# Patient Record
Sex: Female | Born: 1965 | Race: White | Hispanic: No | Smoking: Current every day smoker
Health system: Southern US, Community
[De-identification: ages and names within clinical notes are randomized; demographics above are authoritative.]

## PROBLEM LIST (undated history)

## (undated) DIAGNOSIS — F4310 Post-traumatic stress disorder, unspecified: Secondary | ICD-10-CM

## (undated) DIAGNOSIS — M5430 Sciatica, unspecified side: Secondary | ICD-10-CM

## (undated) DIAGNOSIS — M1990 Unspecified osteoarthritis, unspecified site: Secondary | ICD-10-CM

## (undated) DIAGNOSIS — G43909 Migraine, unspecified, not intractable, without status migrainosus: Secondary | ICD-10-CM

## (undated) DIAGNOSIS — R112 Nausea with vomiting, unspecified: Secondary | ICD-10-CM

## (undated) DIAGNOSIS — F419 Anxiety disorder, unspecified: Secondary | ICD-10-CM

## (undated) DIAGNOSIS — Z9889 Other specified postprocedural states: Secondary | ICD-10-CM

## (undated) DIAGNOSIS — N390 Urinary tract infection, site not specified: Secondary | ICD-10-CM

## (undated) HISTORY — DX: Post-traumatic stress disorder, unspecified: F43.10

## (undated) HISTORY — PX: WISDOM TOOTH EXTRACTION: SHX21

---

## 1998-05-05 ENCOUNTER — Emergency Department (HOSPITAL_COMMUNITY): Admission: EM | Admit: 1998-05-05 | Discharge: 1998-05-05 | Payer: Self-pay | Admitting: Emergency Medicine

## 1999-02-17 ENCOUNTER — Emergency Department (HOSPITAL_COMMUNITY): Admission: EM | Admit: 1999-02-17 | Discharge: 1999-02-17 | Payer: Self-pay | Admitting: Emergency Medicine

## 1999-06-17 ENCOUNTER — Encounter: Payer: Self-pay | Admitting: Emergency Medicine

## 1999-06-17 ENCOUNTER — Emergency Department (HOSPITAL_COMMUNITY): Admission: EM | Admit: 1999-06-17 | Discharge: 1999-06-17 | Payer: Self-pay | Admitting: Emergency Medicine

## 2001-03-30 ENCOUNTER — Emergency Department (HOSPITAL_COMMUNITY): Admission: EM | Admit: 2001-03-30 | Discharge: 2001-03-30 | Payer: Self-pay | Admitting: *Deleted

## 2002-11-27 ENCOUNTER — Emergency Department (HOSPITAL_COMMUNITY): Admission: EM | Admit: 2002-11-27 | Discharge: 2002-11-27 | Payer: Self-pay | Admitting: Emergency Medicine

## 2003-02-02 ENCOUNTER — Emergency Department (HOSPITAL_COMMUNITY): Admission: EM | Admit: 2003-02-02 | Discharge: 2003-02-03 | Payer: Self-pay

## 2004-09-04 ENCOUNTER — Emergency Department (HOSPITAL_COMMUNITY): Admission: EM | Admit: 2004-09-04 | Discharge: 2004-09-04 | Payer: Self-pay | Admitting: Emergency Medicine

## 2004-09-13 ENCOUNTER — Emergency Department (HOSPITAL_COMMUNITY): Admission: EM | Admit: 2004-09-13 | Discharge: 2004-09-13 | Payer: Self-pay | Admitting: Emergency Medicine

## 2004-09-13 IMAGING — CR DG CHEST 2V
2 series · 2 of 2 positions shown · non-contrast
Comparison: None.

CLINICAL DATA: Febrile.  Cough.  Flulike symptoms.
 TWO VIEWS OF THE CHEST:

[view not recorded (1 of 2)]
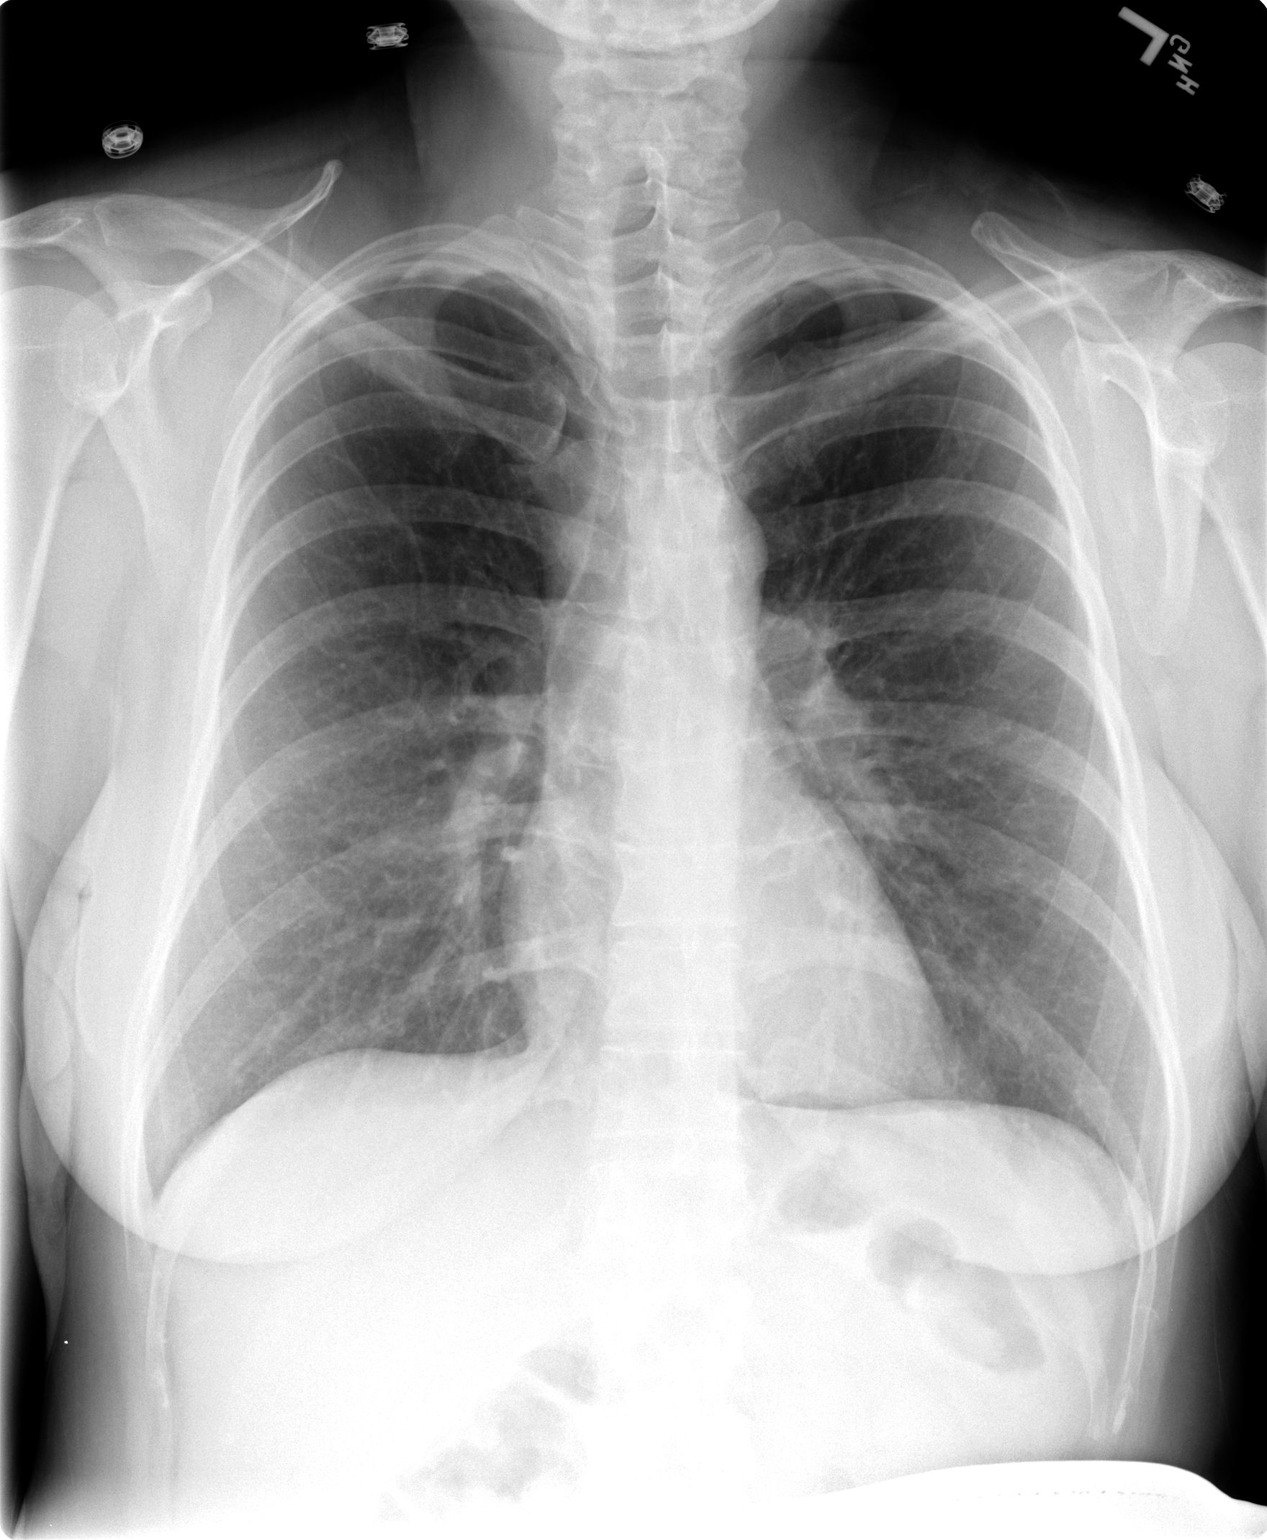

[view not recorded (2 of 2)]
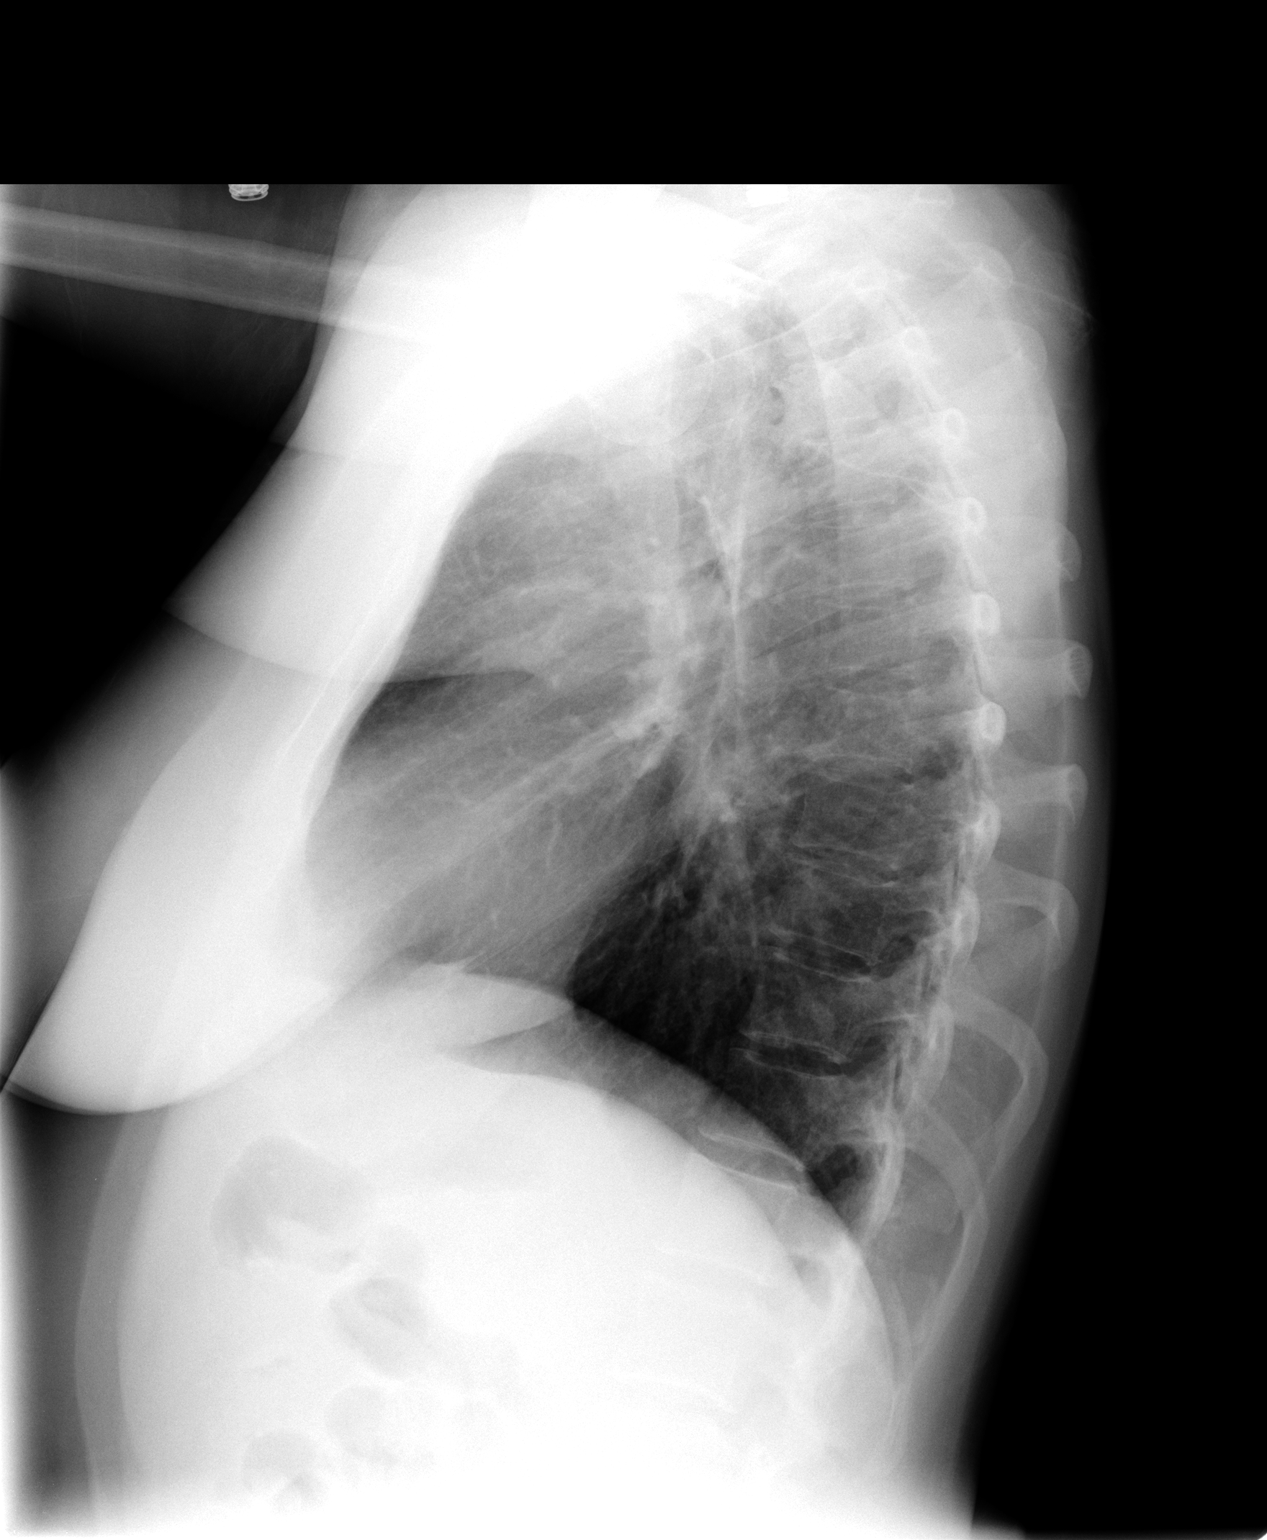

[2 of 2 positions shown; findings below may reference images not displayed]

FINDINGS: No focal lung opacities.  The heart size is normal.  No pleural effusions.
IMPRESSION: No pneumonia.

## 2009-10-12 ENCOUNTER — Emergency Department (HOSPITAL_COMMUNITY): Admission: EM | Admit: 2009-10-12 | Discharge: 2009-10-12 | Payer: Self-pay | Admitting: Emergency Medicine

## 2009-10-12 IMAGING — CT CT ABD-PELV W/O CM
2 of 4 series · 17 of 46 positions shown, 19 images · non-contrast
Comparison: None

CLINICAL DATA: Left flank pain.

CT ABDOMEN AND PELVIS WITHOUT CONTRAST
TECHNIQUE: Multidetector CT imaging of the abdomen and pelvis was
performed following the standard protocol without intravenous
contrast.

[Series 2: stone <(id) w/o a & p (id) · axial · non-contrast · 0.70mm/px · z∈[-381,-11]mm · 14 of 82 slices shown, 16 images]
[im 4/82  soft-tissue]
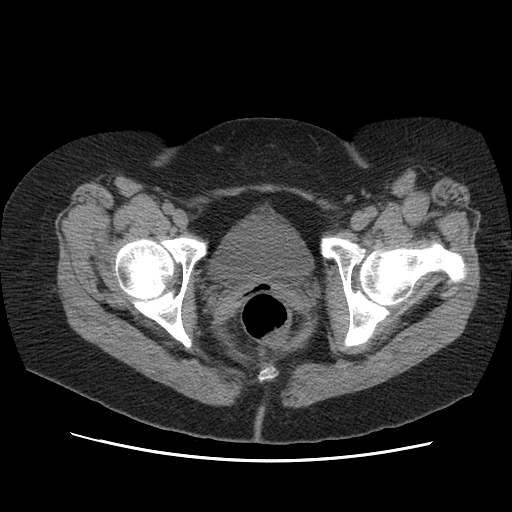
[im 4/82  bone]
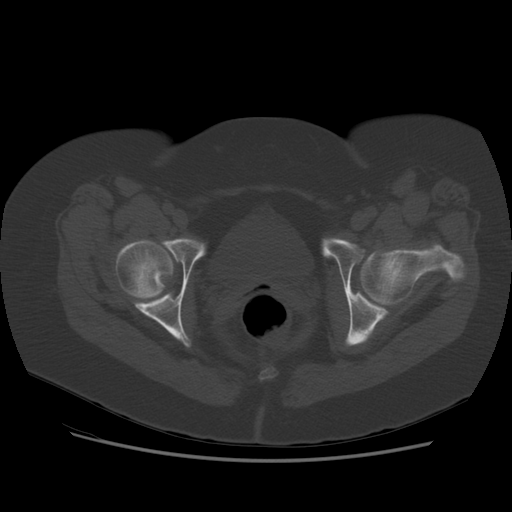
[im 10/82  soft-tissue]
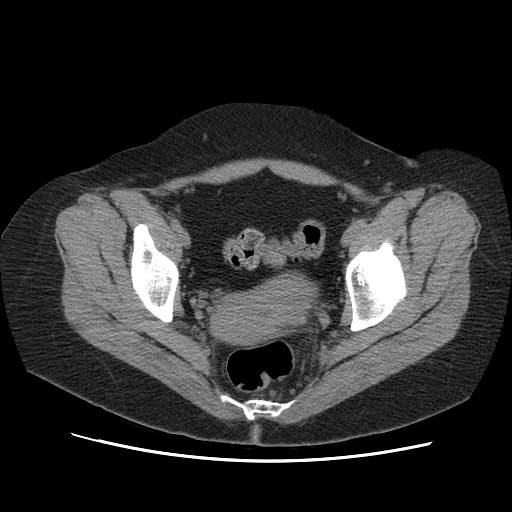
[im 17/82  soft-tissue]
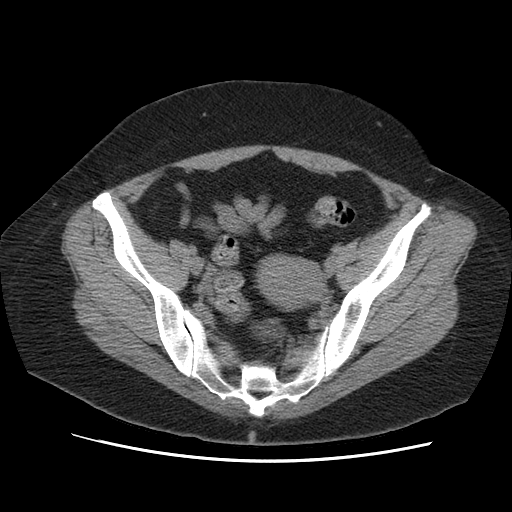
[im 23/82  soft-tissue]
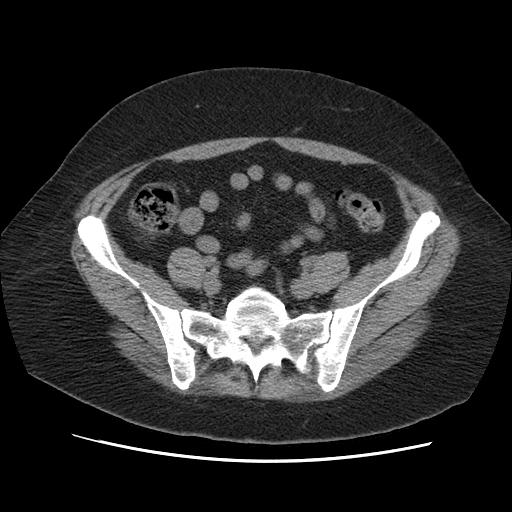
[im 26/82  soft-tissue]
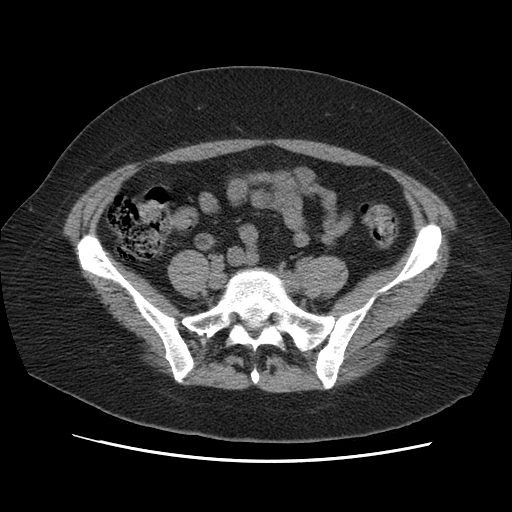
[im 33/82  soft-tissue]
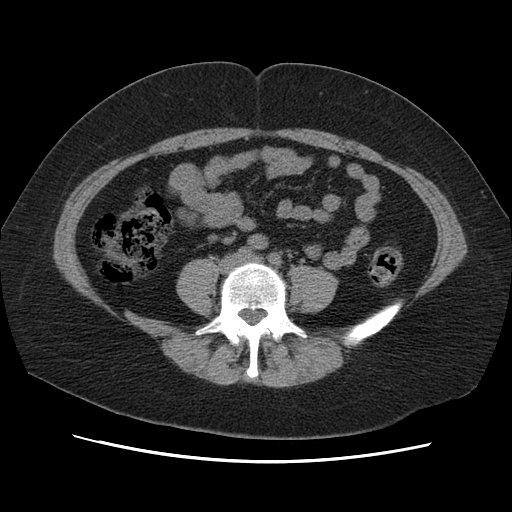
[im 39/82  soft-tissue]
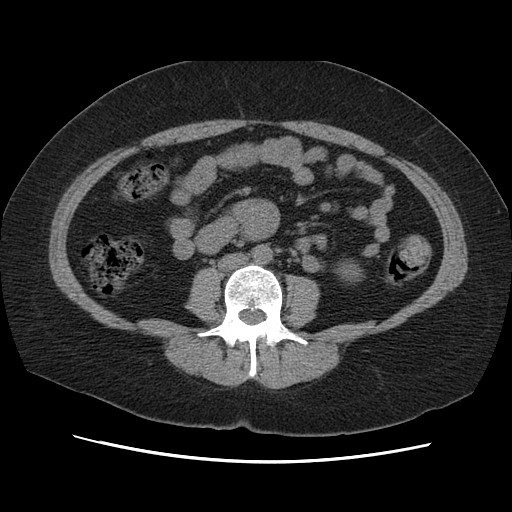
[im 43/82  soft-tissue]
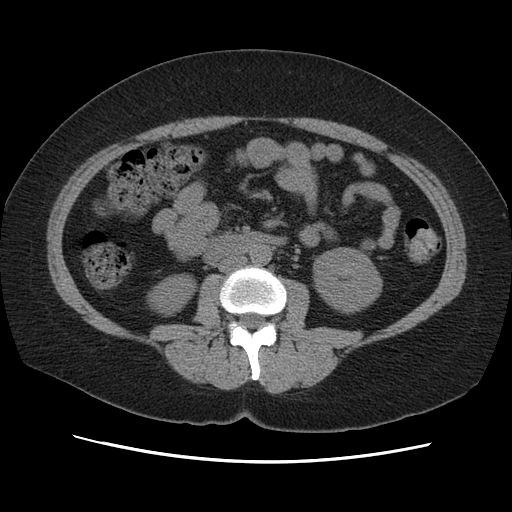
[im 49/82  soft-tissue]
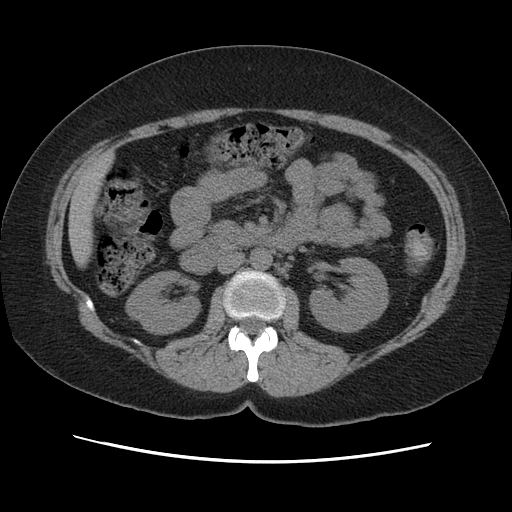
[im 49/82  bone]
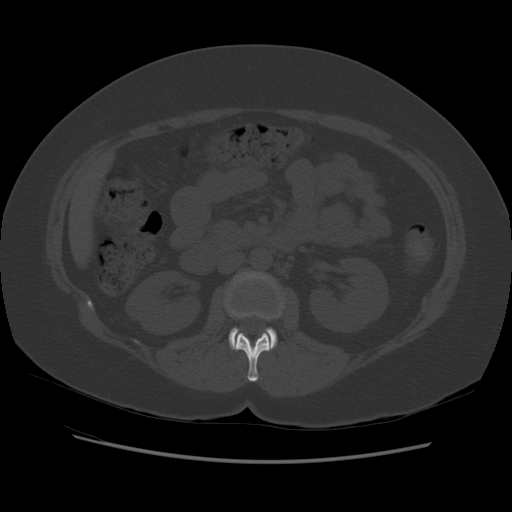
[im 56/82  soft-tissue]
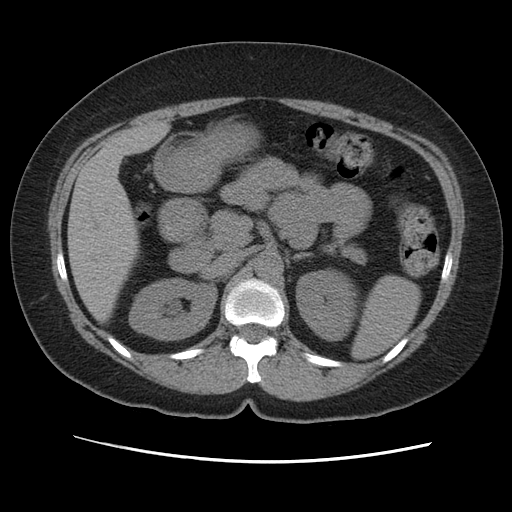
[im 62/82  soft-tissue]
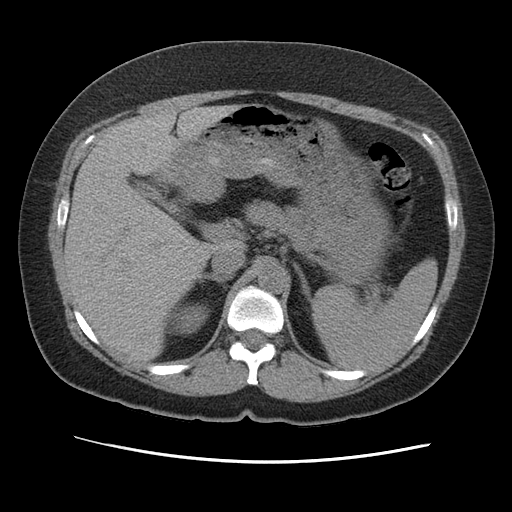
[im 65/82  soft-tissue]
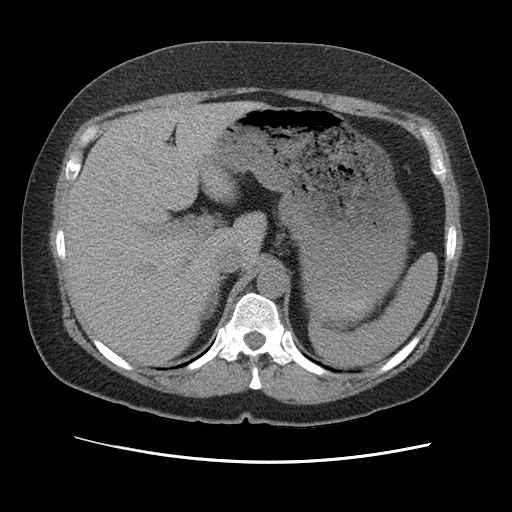
[im 72/82  soft-tissue]
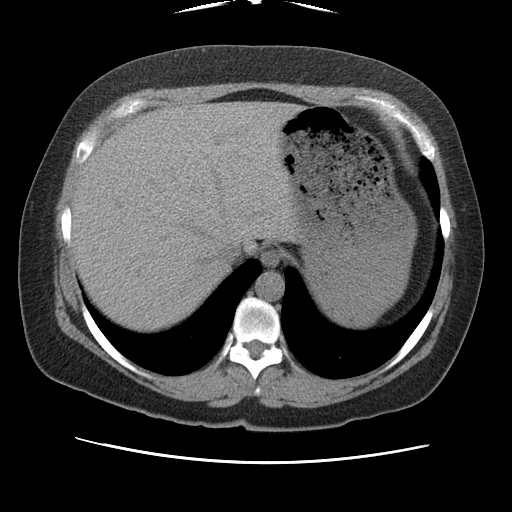
[im 78/82  soft-tissue]
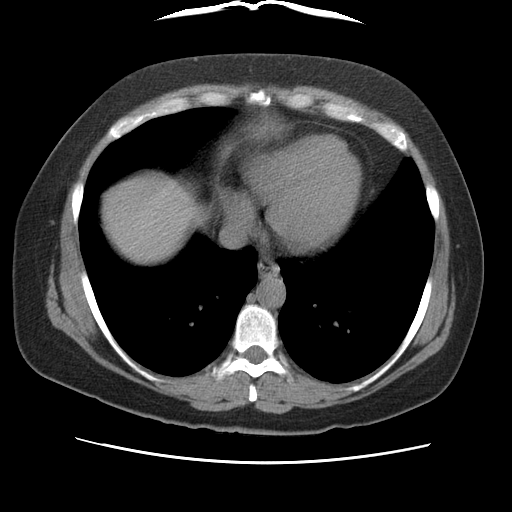

[Series 401: reformatted · coronal · 0.92mm/px · 3 of 114 slices shown]
[im 38/114  soft-tissue]
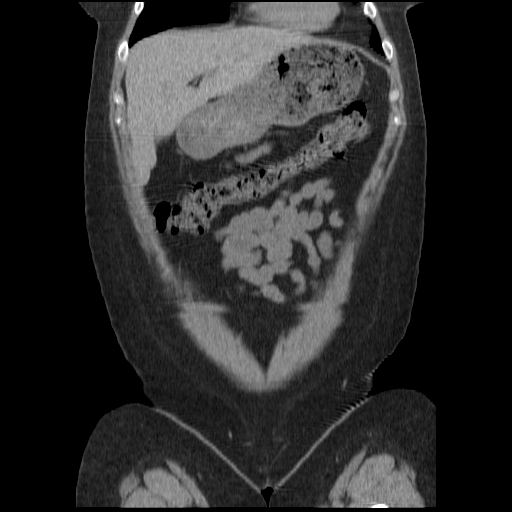
[im 51/114  soft-tissue]
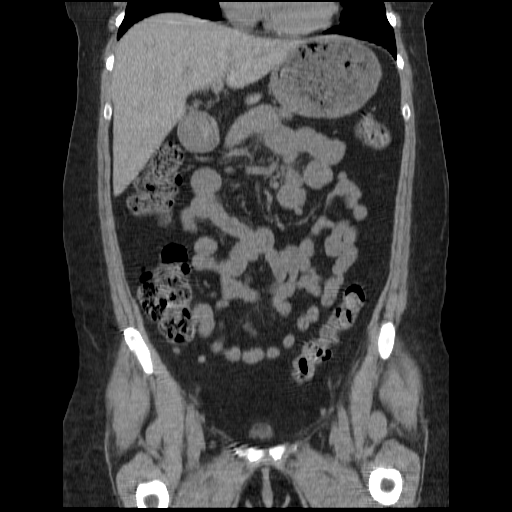
[im 63/114  soft-tissue]
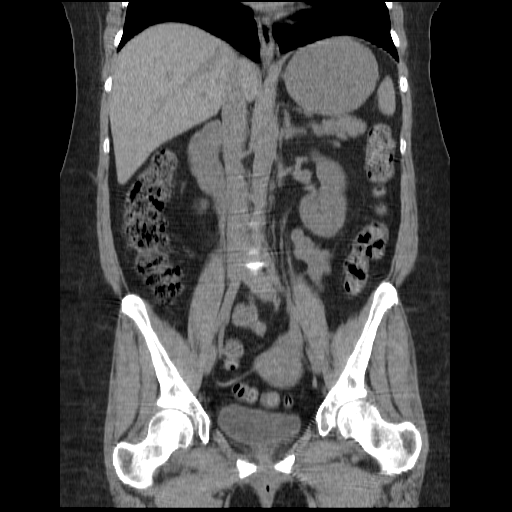

[17 of 46 positions shown; findings below may reference images not displayed]

FINDINGS: Lung bases are clear.  No effusions.  Heart is normal
size.

Abdomen:  No renal or proximal ureteral stones.  No hydronephrosis.
Liver, spleen, pancreas, adrenals have an unremarkable unenhanced
appearance.  Gallbladder is decompressed. Bowel grossly
unremarkable.  No free fluid, free air, or adenopathy.

Pelvis:  Ureters decompressed as is the bladder.  No stones.
Uterus and ovaries have an unremarkable unenhanced appearance.
Bowel grossly unremarkable.  No free fluid, free air, or
adenopathy. Appendix is normal.

No acute bony abnormality.
IMPRESSION: No renal or ureteral stones.  No hydronephrosis.

Normal appendix.

## 2010-10-12 LAB — CBC
MCHC: 33.9 g/dL (ref 30.0–36.0)
MCV: 87.7 fL (ref 78.0–100.0)
Platelets: 281 10*3/uL (ref 150–400)
RDW: 13.8 % (ref 11.5–15.5)

## 2010-10-12 LAB — DIFFERENTIAL
Basophils Absolute: 0.1 10*3/uL (ref 0.0–0.1)
Basophils Relative: 1 % (ref 0–1)
Eosinophils Absolute: 0.3 10*3/uL (ref 0.0–0.7)
Monocytes Relative: 10 % (ref 3–12)
Neutro Abs: 5.3 10*3/uL (ref 1.7–7.7)
Neutrophils Relative %: 57 % (ref 43–77)

## 2010-10-12 LAB — URINALYSIS, ROUTINE W REFLEX MICROSCOPIC
Bilirubin Urine: NEGATIVE
Glucose, UA: NEGATIVE mg/dL
Hgb urine dipstick: NEGATIVE
Ketones, ur: NEGATIVE mg/dL
Nitrite: NEGATIVE
Protein, ur: NEGATIVE mg/dL
Specific Gravity, Urine: 1.02 (ref 1.005–1.030)
Urobilinogen, UA: 0.2 mg/dL (ref 0.0–1.0)
pH: 7.5 (ref 5.0–8.0)

## 2010-10-12 LAB — URINE CULTURE: Colony Count: 25000

## 2010-10-12 LAB — POCT I-STAT, CHEM 8
BUN: 12 mg/dL (ref 6–23)
Calcium, Ion: 1.11 mmol/L — ABNORMAL LOW (ref 1.12–1.32)
Creatinine, Ser: 0.7 mg/dL (ref 0.4–1.2)
Glucose, Bld: 106 mg/dL — ABNORMAL HIGH (ref 70–99)
Hemoglobin: 17 g/dL — ABNORMAL HIGH (ref 12.0–15.0)
TCO2: 22 mmol/L (ref 0–100)

## 2014-05-03 ENCOUNTER — Emergency Department (HOSPITAL_COMMUNITY)
Admission: EM | Admit: 2014-05-03 | Discharge: 2014-05-04 | Disposition: A | Discharge status: Home / Self Care | Payer: Self-pay | Attending: Emergency Medicine | Admitting: Emergency Medicine

## 2014-05-03 ENCOUNTER — Encounter (HOSPITAL_COMMUNITY): Payer: Self-pay | Admitting: Emergency Medicine

## 2014-05-03 DIAGNOSIS — M545 Low back pain, unspecified: Secondary | ICD-10-CM

## 2014-05-03 DIAGNOSIS — Z720 Tobacco use: Secondary | ICD-10-CM | POA: Insufficient documentation

## 2014-05-03 DIAGNOSIS — R110 Nausea: Secondary | ICD-10-CM | POA: Insufficient documentation

## 2014-05-03 LAB — CBC WITH DIFFERENTIAL/PLATELET
BASOS ABS: 0.1 10*3/uL (ref 0.0–0.1)
BASOS PCT: 1 % (ref 0–1)
EOS PCT: 3 % (ref 0–5)
Eosinophils Absolute: 0.3 10*3/uL (ref 0.0–0.7)
HEMATOCRIT: 41.3 % (ref 36.0–46.0)
HEMOGLOBIN: 14 g/dL (ref 12.0–15.0)
LYMPHS PCT: 34 % (ref 12–46)
Lymphs Abs: 3.6 10*3/uL (ref 0.7–4.0)
MCH: 29.4 pg (ref 26.0–34.0)
MCHC: 33.9 g/dL (ref 30.0–36.0)
MCV: 86.6 fL (ref 78.0–100.0)
MONO ABS: 0.8 10*3/uL (ref 0.1–1.0)
MONOS PCT: 8 % (ref 3–12)
Neutro Abs: 5.9 10*3/uL (ref 1.7–7.7)
Neutrophils Relative %: 54 % (ref 43–77)
Platelets: 282 10*3/uL (ref 150–400)
RBC: 4.77 MIL/uL (ref 3.87–5.11)
RDW: 13.8 % (ref 11.5–15.5)
WBC: 10.7 10*3/uL — ABNORMAL HIGH (ref 4.0–10.5)

## 2014-05-03 LAB — URINALYSIS, ROUTINE W REFLEX MICROSCOPIC
BILIRUBIN URINE: NEGATIVE
Glucose, UA: NEGATIVE mg/dL
HGB URINE DIPSTICK: NEGATIVE
KETONES UR: NEGATIVE mg/dL
Leukocytes, UA: NEGATIVE
NITRITE: NEGATIVE
PH: 7 (ref 5.0–8.0)
Protein, ur: NEGATIVE mg/dL
SPECIFIC GRAVITY, URINE: 1.027 (ref 1.005–1.030)
Urobilinogen, UA: 0.2 mg/dL (ref 0.0–1.0)

## 2014-05-03 LAB — BASIC METABOLIC PANEL
ANION GAP: 16 — AB (ref 5–15)
BUN: 16 mg/dL (ref 6–23)
CALCIUM: 9.2 mg/dL (ref 8.4–10.5)
CO2: 21 meq/L (ref 19–32)
CREATININE: 0.71 mg/dL (ref 0.50–1.10)
Chloride: 105 mEq/L (ref 96–112)
GFR calc Af Amer: >90 mL/min (ref >90)
GFR calc non Af Amer: >90 mL/min (ref >90)
Glucose, Bld: 92 mg/dL (ref 70–99)
Potassium: 4.1 mEq/L (ref 3.7–5.3)
Sodium: 142 mEq/L (ref 137–147)

## 2014-05-03 MED ORDER — IBUPROFEN 600 MG PO TABS: 600.0000 mg | ORAL_TABLET | Freq: Four times a day (QID) | ORAL | Status: DC | PRN

## 2014-05-03 MED ORDER — OXYCODONE-ACETAMINOPHEN 5-325 MG PO TABS
1.0000 | ORAL_TABLET | Freq: Once | ORAL | Status: AC
  Administered 2014-05-03: 1 via ORAL
  Filled 2014-05-03: qty 1

## 2014-05-03 MED ORDER — IBUPROFEN 200 MG PO TABS
600.0000 mg | ORAL_TABLET | Freq: Once | ORAL | Status: AC
  Administered 2014-05-03: 600 mg via ORAL
  Filled 2014-05-03: qty 3

## 2014-05-03 MED ORDER — OXYCODONE-ACETAMINOPHEN 5-325 MG PO TABS: 1.0000 | ORAL_TABLET | Freq: Four times a day (QID) | ORAL | Status: DC | PRN

## 2014-05-03 NOTE — ED Notes (Signed)
Pt arrived to the ED with a complaint of flank pain that has no radiation.  Pt states the pain is localized on the left flank mid way distal.  Pt states the pain began around 0700 yesterday.  Pt has tried tylenol and epsom salt baths with out relief.

## 2014-05-03 NOTE — Discharge Instructions (Signed)
Back Pain, Adult Low back pain is very common. About 1 in 5 people have back pain.The cause of low back pain is rarely dangerous. The pain often gets better over time.About half of people with a sudden onset of back pain feel better in just 2 weeks. About 8 in 10 people feel better by 6 weeks.  CAUSES Some common causes of back pain include:  Strain of the muscles or ligaments supporting the spine.  Wear and tear (degeneration) of the spinal discs.  Arthritis.  Direct injury to the back. DIAGNOSIS Most of the time, the direct cause of low back pain is not known.However, back pain can be treated effectively even when the exact cause of the pain is unknown.Answering your caregiver's questions about your overall health and symptoms is one of the most accurate ways to make sure the cause of your pain is not dangerous. If your caregiver needs more information, he or she may order lab work or imaging tests (X-rays or MRIs).However, even if imaging tests show changes in your back, this usually does not require surgery. HOME CARE INSTRUCTIONS For many people, back pain returns.Since low back pain is rarely dangerous, it is often a condition that people can learn to manageon their own.   Remain active. It is stressful on the back to sit or stand in one place. Do not sit, drive, or stand in one place for more than 30 minutes at a time. Take short walks on level surfaces as soon as pain allows.Try to increase the length of time you walk each day.  Do not stay in bed.Resting more than 1 or 2 days can delay your recovery.  Do not avoid exercise or work.Your body is made to move.It is not dangerous to be active, even though your back may hurt.Your back will likely heal faster if you return to being active before your pain is gone.  Pay attention to your body when you bend and lift. Many people have less discomfortwhen lifting if they bend their knees, keep the load close to their bodies,and  avoid twisting. Often, the most comfortable positions are those that put less stress on your recovering back.  Find a comfortable position to sleep. Use a firm mattress and lie on your side with your knees slightly bent. If you lie on your back, put a pillow under your knees.  Only take over-the-counter or prescription medicines as directed by your caregiver. Over-the-counter medicines to reduce pain and inflammation are often the most helpful.Your caregiver may prescribe muscle relaxant drugs.These medicines help dull your pain so you can more quickly return to your normal activities and healthy exercise.  Put ice on the injured area.  Put ice in a plastic bag.  Place a towel between your skin and the bag.  Leave the ice on for 15-20 minutes, 03-04 times a day for the first 2 to 3 days. After that, ice and heat may be alternated to reduce pain and spasms.  Ask your caregiver about trying back exercises and gentle massage. This may be of some benefit.  Avoid feeling anxious or stressed.Stress increases muscle tension and can worsen back pain.It is important to recognize when you are anxious or stressed and learn ways to manage it.Exercise is a great option. SEEK MEDICAL CARE IF:  You have pain that is not relieved with rest or medicine.  You have pain that does not improve in 1 week.  You have new symptoms.  You are generally not feeling well. SEEK   IMMEDIATE MEDICAL CARE IF:   You have pain that radiates from your back into your legs.  You develop new bowel or bladder control problems.  You have unusual weakness or numbness in your arms or legs.  You develop nausea or vomiting.  You develop abdominal pain.  You feel faint. Document Released: 07/06/2005 Document Revised: 01/05/2012 Document Reviewed: 11/07/2013 Exit[employer] Patient Information 2015 [employer]. This information is not intended to replace advice given to you by your health care provider. Make sure you  discuss any questions you have with your health care provider.  

## 2014-05-03 NOTE — ED Provider Notes (Signed)
CSN: [id number]     Arrival date & time 05/03/14  2048 History   First MD Initiated Contact with Patient 05/03/14 2226     Chief Complaint  Patient presents with  . Flank Pain     (Consider location/radiation/quality/duration/timing/severity/associated sxs/prior Treatment) Patient is a 48 y.o. female presenting with flank pain. The history is provided by the patient.  Flank Pain This is a new problem. Pertinent negatives include no chest pain, no abdominal pain, no headaches and no shortness of breath.   patient presents with left lower back/flank pain. Began yesterday. It is dull. Is worse with movements. No dysuria.   she has had some nausea no fevers. This does not feel like kidney stone she's had the past. No vaginal bleeding or discharge. No diarrhea or constipation. The pain is worse with certain movements. No past medical history on file. No past surgical history on file. No family history on file. History  Substance Use Topics  . Smoking status: Current Every Day Smoker -- 0.50 packs/day  . Smokeless tobacco: Never Used  . Alcohol Use: No   OB History   Grav Para Term Preterm Abortions TAB SAB Ect Mult Living                 Review of Systems  Constitutional: Negative for fever, activity change and appetite change.  Eyes: Negative for pain.  Respiratory: Negative for chest tightness and shortness of breath.   Cardiovascular: Negative for chest pain and leg swelling.  Gastrointestinal: Positive for nausea. Negative for vomiting, abdominal pain and diarrhea.  Genitourinary: Positive for flank pain.  Musculoskeletal: Negative for back pain and neck stiffness.  Neurological: Negative for weakness, numbness and headaches.  Psychiatric/Behavioral: Negative for behavioral problems.      Allergies  Review of patient's allergies indicates no known allergies.  Home Medications   Prior to Admission medications   Medication Sig Start Date End Date Taking? Authorizing  Provider  acetaminophen (TYLENOL) 500 MG tablet Take 500 mg by mouth every 6 (six) hours as needed for headache.   Yes Historical Provider, MD  Ibuprofen-Diphenhydramine Cit (ADVIL PM PO) Take 2 tablets by mouth at bedtime. To sleep   Yes Historical Provider, MD  Tetrahydrozoline-Zn Sulfate (EYE IRRITATION RELIEF OP) Apply 1 drop to eye as needed (for dry eyes). 1 drop in both eyes as needed for irritation   Yes Historical Provider, MD  ibuprofen (ADVIL,MOTRIN) 600 MG tablet Take 1 tablet (600 mg total) by mouth every 6 (six) hours as needed. 05/03/14   Juliet RudeNathan R. Therron Sells, MD  oxyCODONE-acetaminophen (PERCOCET/ROXICET) 5-325 MG per tablet Take 1-2 tablets by mouth every 6 (six) hours as needed for severe pain. 05/03/14   Juliet RudeNathan R. Yalda Herd, MD   BP 105/77  Pulse 73  Temp(Src) 97.8 F (36.6 C) (Oral)  Resp 20  SpO2 96% Physical Exam  Nursing note and vitals reviewed. Constitutional: She is oriented to person, place, and time. She appears well-developed and well-nourished.  HENT:  Head: Normocephalic and atraumatic.  Eyes: EOM are normal. Pupils are equal, round, and reactive to light.  Neck: Normal range of motion. Neck supple.  Cardiovascular: Normal rate, regular rhythm and normal heart sounds.   No murmur heard. Pulmonary/Chest: Effort normal and breath sounds normal. No respiratory distress. She has no wheezes. She has no rales.  Abdominal: Soft. Bowel sounds are normal. She exhibits no distension. There is no tenderness. There is no rebound and no guarding.  Musculoskeletal: Normal range of motion.  Tenderness  over musculature over left lower back laterally. No rash. Step-off or deformity. No CVA tenderness.  Neurological: She is alert and oriented to person, place, and time. No cranial nerve deficit.  Skin: Skin is warm and dry.  Psychiatric: She has a normal mood and affect. Her speech is normal.    ED Course  Procedures (including critical care time) Labs Review Labs  Reviewed  CBC WITH DIFFERENTIAL - Abnormal; Notable for the following:    WBC 10.7 (*)    All other components within normal limits  BASIC METABOLIC PANEL - Abnormal; Notable for the following:    Anion gap 16 (*)    All other components within normal limits  URINALYSIS, ROUTINE W REFLEX MICROSCOPIC    Imaging Review No results found.   EKG Interpretation None      MDM   Final diagnoses:  Left-sided low back pain without sciatica    Patient with back pain. Likely musculoskeletal. No hematuria. Doubt infection. White count is reassuring. Could be early zoster. We'll treat with pain medicines and anti-inflammatories the patient will followup as needed.    Juliet RudeNathan R. Rubin PayorPickering, MD 05/03/14 2340

## 2014-05-10 ENCOUNTER — Encounter (HOSPITAL_COMMUNITY): Payer: Self-pay | Admitting: Emergency Medicine

## 2014-05-10 ENCOUNTER — Emergency Department (HOSPITAL_COMMUNITY)
Admission: EM | Admit: 2014-05-10 | Discharge: 2014-05-10 | Disposition: A | Discharge status: Home / Self Care | Payer: Self-pay | Attending: Emergency Medicine | Admitting: Emergency Medicine

## 2014-05-10 DIAGNOSIS — M25571 Pain in right ankle and joints of right foot: Secondary | ICD-10-CM | POA: Insufficient documentation

## 2014-05-10 DIAGNOSIS — M79672 Pain in left foot: Secondary | ICD-10-CM

## 2014-05-10 DIAGNOSIS — M25572 Pain in left ankle and joints of left foot: Secondary | ICD-10-CM | POA: Insufficient documentation

## 2014-05-10 DIAGNOSIS — M79671 Pain in right foot: Secondary | ICD-10-CM

## 2014-05-10 DIAGNOSIS — Z720 Tobacco use: Secondary | ICD-10-CM | POA: Insufficient documentation

## 2014-05-10 MED ORDER — IBUPROFEN 800 MG PO TABS: 800.0000 mg | ORAL_TABLET | Freq: Three times a day (TID) | ORAL | Status: DC

## 2014-05-10 MED ORDER — HYDROCODONE-ACETAMINOPHEN 5-325 MG PO TABS: 1.0000 | ORAL_TABLET | ORAL | Status: DC | PRN

## 2014-05-10 NOTE — Progress Notes (Signed)
[facility] Stacy,  Did not get to see patient but will be sending information about [facility] to help patient establish primary care, using the address provided.

## 2014-05-10 NOTE — ED Provider Notes (Signed)
CSN: [id number]     Arrival date & time 05/10/14  1111 History   First MD Initiated Contact with Patient 05/10/14 1122     Chief Complaint  Patient presents with  . Foot Pain     (Consider location/radiation/quality/duration/timing/severity/associated sxs/prior Treatment) HPI Comments: Patient presents today with a chief complaint of bilateral feet pain.  She reports that the pain is located on her 4th and 5th toes of both feet and the bottoms of both feet near the heel.  Pain has been present for the past week and is gradually worsening.  She reports that she got new shoes one week ago and thinks that this may be contributing to the pain.  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  She reports intermittent swelling of the feet.  Denies any warmth or erythema.  Denies numbness or tingling.  She has been taking OTC pain medication without relief.    The history is provided by the patient.    History reviewed. No pertinent past medical history. No past surgical history on file. No family history on file. History  Substance Use Topics  . Smoking status: Current Every Day Smoker -- 0.50 packs/day  . Smokeless tobacco: Never Used  . Alcohol Use: No   OB History   Grav Para Term Preterm Abortions TAB SAB Ect Mult Living                 Review of Systems  Constitutional: Negative for fever and chills.  Musculoskeletal:       Bilateral feet pain  Skin: Negative for color change and wound.  Neurological: Negative for numbness.      Allergies  Review of patient's allergies indicates no known allergies.  Home Medications   Prior to Admission medications   Medication Sig Start Date End Date Taking? Authorizing Provider  acetaminophen (TYLENOL) 500 MG tablet Take 500 mg by mouth every 6 (six) hours as needed for headache.    Historical Provider, MD  ibuprofen (ADVIL,MOTRIN) 600 MG tablet Take 1 tablet (600 mg total) by mouth every 6 (six) hours  as needed. 05/03/14   Juliet RudeNathan R. Pickering, MD  Ibuprofen-Diphenhydramine Cit (ADVIL PM PO) Take 2 tablets by mouth at bedtime. To sleep    Historical Provider, MD  oxyCODONE-acetaminophen (PERCOCET/ROXICET) 5-325 MG per tablet Take 1-2 tablets by mouth every 6 (six) hours as needed for severe pain. 05/03/14   Juliet RudeNathan R. Rubin PayorPickering, MD  Tetrahydrozoline-Zn Sulfate (EYE IRRITATION RELIEF OP) Apply 1 drop to eye as needed (for dry eyes). 1 drop in both eyes as needed for irritation    Historical Provider, MD   BP 112/74  Pulse 83  Temp(Src) 98.1 F (36.7 C) (Oral)  Resp 20  SpO2 99% Physical Exam  Nursing note and vitals reviewed. Constitutional: She appears well-developed and well-nourished.  HENT:  Head: Normocephalic and atraumatic.  Mouth/Throat: Oropharynx is clear and moist.  Neck: Normal range of motion. Neck supple.  Cardiovascular: Normal rate, regular rhythm and normal heart sounds.   Pulses:      Dorsalis pedis pulses are 2+ on the right side, and 2+ on the left side.  Pulmonary/Chest: Effort normal and breath sounds normal.  Musculoskeletal: Normal range of motion.  No erythema, edema, or warmth of the feet bilaterally  Neurological: She is alert.  Distal sensation of both feet intact  Skin: Skin is warm and dry.  Psychiatric: She has a normal mood and affect.  ED Course  Procedures (including critical care time) Labs Review Labs Reviewed - No data to display  Imaging Review No results found.   EKG Interpretation None      MDM   Final diagnoses:  None   Patient presents today with bilateral foot pain.  She is a Child psychotherapistwaitress and spends a lot of time on her feet.  No signs of infection.  Patient neurovascularly intact.  Suspect plantar fascitis and ill fitting shoes.  Patient instructed to get insoles for her shoes.  Patient stable for discharge.  Return precautions given.      Santiago GladHeather Clancy Mullarkey, PA-C 05/12/14 [phone]

## 2014-05-10 NOTE — ED Notes (Signed)
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  States she has been wearing 2 new pairs of shoes.  States that she has to work and needs to be able to work this weekend because it is [employer] homecoming and she is scheduled to work doubles again.  States that she has a podiatry appt in 2 wks.

## 2014-05-13 ENCOUNTER — Encounter (HOSPITAL_COMMUNITY): Payer: Self-pay | Admitting: Emergency Medicine

## 2014-05-13 ENCOUNTER — Emergency Department (HOSPITAL_COMMUNITY)
Admission: EM | Admit: 2014-05-13 | Discharge: 2014-05-13 | Disposition: A | Discharge status: Home / Self Care | Payer: Self-pay | Attending: Emergency Medicine | Admitting: Emergency Medicine

## 2014-05-13 DIAGNOSIS — Z79899 Other long term (current) drug therapy: Secondary | ICD-10-CM | POA: Insufficient documentation

## 2014-05-13 DIAGNOSIS — M79671 Pain in right foot: Secondary | ICD-10-CM | POA: Insufficient documentation

## 2014-05-13 DIAGNOSIS — R238 Other skin changes: Secondary | ICD-10-CM | POA: Insufficient documentation

## 2014-05-13 DIAGNOSIS — S90821A Blister (nonthermal), right foot, initial encounter: Secondary | ICD-10-CM

## 2014-05-13 DIAGNOSIS — Z720 Tobacco use: Secondary | ICD-10-CM | POA: Insufficient documentation

## 2014-05-13 DIAGNOSIS — M79672 Pain in left foot: Secondary | ICD-10-CM | POA: Insufficient documentation

## 2014-05-13 MED ORDER — GABAPENTIN 100 MG PO CAPS: ORAL_CAPSULE | ORAL | Status: DC

## 2014-05-13 NOTE — ED Provider Notes (Signed)
CSN: [id number]     Arrival date & time 05/13/14  1232 History  This chart was scribed for a non-physician practitioner, Terri Piedraourtney Forcucci, PA-C working with Toy BakerAnthony T Allen, MD by [place] Peace, ED Scribe. The patient was seen in [facility]. The patient's care was started at 2:51 PM.    Chief Complaint  Patient presents with  . Foot Swelling     The history is provided by the patient. No language interpreter was used.  HPI Comments: Kerry Young is a 48 y.o. female who presents to the Emergency Department complaining of bilateral foot swelling onset 1.5 weeks ago with associated pain and blistering with right foot being worse than the left. Describes pain as "burning", "aching", and "numb". Pt does not have PCP. She rates pain as 8/10 that is exacerbated with movement of affected area. 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 Pt has appt with podiatrist in 2 weeks. Pt was seen seen 3 days ago here at ED for same issue. No DM, hypertension, or allergies. No history of similar incidents in the past. Pt has been taking Ibuprofen and hydrocodone without any relief.    History reviewed. No pertinent past medical history. History reviewed. No pertinent past surgical history. History reviewed. No pertinent family history. History  Substance Use Topics  . Smoking status: Current Every Day Smoker -- 0.50 packs/day    Types: Cigarettes  . Smokeless tobacco: Never Used  . Alcohol Use: No   OB History   Grav Para Term Preterm Abortions TAB SAB Ect Mult Living                 Review of Systems  Constitutional: Negative for fever, chills and fatigue.  Musculoskeletal:       Bilateral foot pain and swellilng. Positive ankle stiffness.   Skin: Negative for wound.       Blistering to plantar aspect of foot and heel of right foot.   All other systems reviewed and are negative.     Allergies  Review of patient's allergies indicates no known  allergies.  Home Medications   Prior to Admission medications   Medication Sig Start Date End Date Taking? Authorizing Provider  acetaminophen (TYLENOL) 500 MG tablet Take 500 mg by mouth every 6 (six) hours as needed for headache.   Yes Historical Provider, MD  HYDROcodone-acetaminophen (NORCO/VICODIN) 5-325 MG per tablet Take 1-2 tablets by mouth every 4 (four) hours as needed. 05/10/14  Yes Heather Laisure, PA-C  ibuprofen (ADVIL,MOTRIN) 800 MG tablet Take 1 tablet (800 mg total) by mouth 3 (three) times daily. 05/10/14  Yes Heather Laisure, PA-C  oxyCODONE-acetaminophen (PERCOCET/ROXICET) 5-325 MG per tablet Take 1-2 tablets by mouth every 6 (six) hours as needed for severe pain. 05/03/14  Yes Nathan R. Rubin PayorPickering, MD  Tetrahydrozoline-Zn Sulfate (EYE IRRITATION RELIEF OP) Apply 1 drop to eye as needed (for dry eyes). 1 drop in both eyes as needed for irritation   Yes Historical Provider, MD  gabapentin (NEURONTIN) 100 MG capsule Take 1 pill on day one Take a pill with breakfast and lunch on day two Take a pill with breakfast lunch and dinner on day three and then continue to take 3 times daily 05/13/14   Ghada Abbett A Forcucci, PA-C   BP 118/80  Pulse 78  Temp(Src) 97.9 F (36.6 C) (Oral)  Resp 16  SpO2 99% Physical Exam  Nursing note and vitals reviewed. Constitutional: She is  oriented to person, place, and time. She appears well-developed and well-nourished. No distress.  HENT:  Head: Normocephalic and atraumatic.  Mouth/Throat: Oropharynx is clear and moist. No oropharyngeal exudate.  Eyes: Conjunctivae and EOM are normal. Pupils are equal, round, and reactive to light. No scleral icterus.  Neck: Normal range of motion. Neck supple. No JVD present. No thyromegaly present.  Cardiovascular: Normal rate.   Pulmonary/Chest: Effort normal. No respiratory distress.  Musculoskeletal: Normal range of motion.       Right ankle: She exhibits normal range of motion, no swelling, no  ecchymosis, no deformity, no laceration and normal pulse. Tenderness. No lateral malleolus, no medial malleolus, no AITFL, no CF ligament, no posterior TFL, no head of 5th metatarsal and no proximal fibula tenderness found. Achilles tendon normal.       Left ankle: She exhibits normal range of motion, no swelling, no ecchymosis, no deformity, no laceration and normal pulse. Tenderness. No lateral malleolus, no medial malleolus, no AITFL, no CF ligament, no posterior TFL, no head of 5th metatarsal and no proximal fibula tenderness found. Achilles tendon normal.  Patient has bilateral pain on the heads of the metarsals bilaterally.    Lymphadenopathy:    She has no cervical adenopathy.  Neurological: She is alert and oriented to person, place, and time.  2+ DP and TP pulses bilaterally.   Skin: Skin is warm and dry.  Psychiatric: She has a normal mood and affect. Her behavior is normal. Judgment and thought content normal.    ED Course  Procedures (including critical care time) Labs Review Labs Reviewed - No data to display  Results for orders placed during the hospital encounter of 05/03/14  CBC WITH DIFFERENTIAL      Result Value Ref Range   WBC 10.7 (*) 4.0 - 10.5 K/uL   RBC 4.77  3.87 - 5.11 MIL/uL   Hemoglobin 14.0  12.0 - 15.0 g/dL   HCT 16.1  09.6 - 04.5 %   MCV 86.6  78.0 - 100.0 fL   MCH 29.4  26.0 - 34.0 pg   MCHC 33.9  30.0 - 36.0 g/dL   RDW 40.9  81.1 - 91.4 %   Platelets 282  150 - 400 K/uL   Neutrophils Relative % 54  43 - 77 %   Neutro Abs 5.9  1.7 - 7.7 K/uL   Lymphocytes Relative 34  12 - 46 %   Lymphs Abs 3.6  0.7 - 4.0 K/uL   Monocytes Relative 8  3 - 12 %   Monocytes Absolute 0.8  0.1 - 1.0 K/uL   Eosinophils Relative 3  0 - 5 %   Eosinophils Absolute 0.3  0.0 - 0.7 K/uL   Basophils Relative 1  0 - 1 %   Basophils Absolute 0.1  0.0 - 0.1 K/uL  BASIC METABOLIC PANEL      Result Value Ref Range   Sodium 142  137 - 147 mEq/L   Potassium 4.1  3.7 - 5.3 mEq/L    Chloride 105  96 - 112 mEq/L   CO2 21  19 - 32 mEq/L   Glucose, Bld 92  70 - 99 mg/dL   BUN 16  6 - 23 mg/dL   Creatinine, Ser 7.82  0.50 - 1.10 mg/dL   Calcium 9.2  8.4 - 95.6 mg/dL   GFR calc non Af Amer >90  >90 mL/min   GFR calc Af Amer >90  >90 mL/min   Anion gap 16 (*) 5 -  15  URINALYSIS, ROUTINE W REFLEX MICROSCOPIC      Result Value Ref Range   Color, Urine YELLOW  YELLOW   APPearance CLEAR  CLEAR   Specific Gravity, Urine 1.027  1.005 - 1.030   pH 7.0  5.0 - 8.0   Glucose, UA NEGATIVE  NEGATIVE mg/dL   Hgb urine dipstick NEGATIVE  NEGATIVE   Bilirubin Urine NEGATIVE  NEGATIVE   Ketones, ur NEGATIVE  NEGATIVE mg/dL   Protein, ur NEGATIVE  NEGATIVE mg/dL   Urobilinogen, UA 0.2  0.0 - 1.0 mg/dL   Nitrite NEGATIVE  NEGATIVE   Leukocytes, UA NEGATIVE  NEGATIVE   No results found.    Imaging Review No results found.   EKG Interpretation None     Medications - No data to display  2:57 PM- Treatment plan was discussed with patient who verbalizes understanding and agrees.   MDM   Final diagnoses:  Bilateral foot pain  Blister of foot, right, initial encounter   Patient is a 48 y.o. Female who presents to the ED with bilateral foot pain.  Feet are neurovascularly intact.  There are no gross deformities.  Patient offered xrays which she declined.  Will start on gabapentin.  Patient to follow-up with her podiatry appointment.  Work note given.  Patient is stable for discharge.   I personally performed the services described in this documentation, which was scribed in my presence. The recorded information has been reviewed and is accurate.   Eben Burowourtney A Forcucci, PA-C 05/13/14 [phone]

## 2014-05-13 NOTE — ED Provider Notes (Signed)
Medical screening examination/treatment/procedure(s) were performed by non-physician practitioner and as supervising physician I was immediately available for consultation/collaboration.   EKG Interpretation None       Elyssa Pendelton, MD 05/13/14 0125 

## 2014-05-13 NOTE — ED Notes (Addendum)
Pt c/o bilateral foot swelling and pain x 1.5 weeks and recently started getting blisters.  Pain score 8/10, increasing w/ activity.  [facility].  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  Sts she recently bought a new pair of shoes.  Pt reports she has a podiatry appt in 2 wks.  No redness or warmth noted.     Pt is wanting something further for pain and a note for work.

## 2014-05-13 NOTE — Discharge Instructions (Signed)
Blisters Blisters are fluid-filled sacs that form within the skin. Common causes of blistering are friction, burns, and exposure to irritating chemicals. The fluid in the blister protects the underlying damaged skin. Most of the time it is not recommended that you open blisters. When a blister is opened, there is an increased chance for infection. Usually, a blister will open on its own. They then dry up and peel off within 10 days. If the blister is tense and uncomfortable (painful) the fluid may be drained. If it is drained the roof of the blister should be left intact. The draining should only be done by a medical professional under aseptic conditions. Poorly fitting shoes and boots can cause blisters by being too tight or too loose. Wearing extra socks or using tape, bandages, or pads over the blister-prone area helps prevent the problem by reducing friction. Blisters heal more slowly if you have diabetes or if you have problems with your circulation. You need to be careful about medical follow-up to prevent infection. HOME CARE INSTRUCTIONS  Protect areas where blisters have formed until the skin is healed. Use a special bandage with a hole cut in the middle around the blister. This reduces pressure and friction. When the blister breaks, trim off the loose skin and keep the area clean by washing it with soap daily. Soaking the blister or broken-open blister with diluted vinegar twice daily for 15 minutes will dry it up and speed the healing. Use 3 tablespoons of white vinegar per quart of water (45 mL white vinegar per liter of water). An antibiotic ointment and a bandage can be used to cover the area after soaking.  SEEK MEDICAL CARE IF:   You develop increased redness, pain, swelling, or drainage in the blistered area.  You develop a pus-like discharge from the blistered area, chills, or a fever. MAKE SURE YOU:   Understand these instructions.  Will watch your condition.  Will get help right  away if you are not doing well or get worse. Document Released: 08/13/2004 Document Revised: 09/28/2011 Document Reviewed: 07/11/2008 [facility] Patient Information 2015 [employer], [place]. This information is not intended to replace advice given to you by your health care provider. Make sure you discuss any questions you have with your health care provider.   Emergency Department Resource Guide 1) Find a Doctor and Pay Out of Pocket Although you won't have to find out who is covered by your insurance plan, it is a good idea to ask around and get recommendations. You will then need to call the office and see if the doctor you have chosen will accept you as a new patient and what types of options they offer for patients who are self-pay. Some doctors offer discounts or will set up payment plans for their patients who do not have insurance, but you will need to ask so you aren't surprised when you get to your appointment.  2) Contact Your Local Health Department Not all health departments have doctors that can see patients for sick visits, but many do, so it is worth a call to see if yours does. If you don't know where your local health department is, you can check in your phone book. The CDC also has a tool to help you locate your state's health department, and many state websites also have listings of all of their local health departments.  3) Find a Walk-in Clinic If your illness is not likely to be very severe or complicated, you may want to  try a walk in clinic. These are popping up all over the country in pharmacies, drugstores, and shopping centers. They're usually staffed by nurse practitioners or physician assistants that have been trained to treat common illnesses and complaints. They're usually fairly quick and inexpensive. However, if you have serious medical issues or chronic medical problems, these are probably not your best option.  No Primary Care Doctor: - Call Health Connect at  [phone] -  they can help you locate a primary care doctor that  accepts your insurance, provides certain services, etc. - Physician Referral Service- [phone]  Chronic Pain Problems: Organization         Address  Phone   Notes  [facility]  [phone] Patients need to be referred by their primary care doctor.   Medication Assistance: Organization         Address  Phone   Notes  [facility] Medication [facility] [address] [place], [place] [address] [phone]) [phone] --Must be a resident of [place] [facility] -- Must have NO insurance coverage whatsoever (no Medicaid/ Medicare, etc.) -- The pt. MUST have a primary care doctor that directs their care regularly and follows them in the community   MedAssist  [phone]   [employer]  [phone]    Agencies that provide inexpensive medical care: Organization         Address  Phone   Notes  [facility]  [phone]) [phone]   [facility]    [phone]   [facility] [address] [place], [place] [address] [phone]) [phone]   [facility] [address], [place] [phone]   Planned Parenthood    [phone]) [phone]   [facility]    [phone]   [facility]  [address] Phone:  [phone]) [phone], Fax:  [phone] Hours of Operation:  9 am - 6 pm, M-F.  Also accepts Medicaid/Medicare and self-pay.  [facility]  [address], [place] Phone: [phone](336) [phone], Fax: [phone]. Hours of Operation:  8:30 am - 5:30 pm, M-F.  Also accepts Medicaid and self-pay.  [facility] [place] [address], [place] Phone: [phone]) [phone]   [facility] [address], [place], [place] [phone], Ext. [phone] Mondays & Thursdays: 7-9 AM.  First 15 patients are seen on a first come, first serve basis.    Medicaid-accepting  [facility] Providers:  Organization         Address  Phone   Notes  [facility] [address], [place] [phone]) [phone] Also accepts self-pay patients.  [facility] [address], [place]  [phone]) [phone]   [facility] [address], [place] [phone]) [phone]   [facility] [address] [place] [address], [place] [phone]) [phone]   Renaye RakersVeita Bland [address], [place]   [phone]) [phone] Only accepts [place] Access [place] patients after they have their name applied to their card.   Self-Pay (no insurance) in [facility] [place]:  Organization         Address  Phone   Notes  Sickle Cell Patients, [facility] [address], [place] [phone]) [phone]   [facility] [address], [place] [phone]) [phone]  [facility] [place]  [address], [place] [phone]   [facility]/Dr. Osei-Bonsu  [address] [place] [address], [place] or [address], [place] [phone] Phone number for both [place] and [place] locations is the same.  [facility] [address], [place] [phone]   [facility] [address] [place] [address], [place] or [address] [phone]) [phone]   [facility] [address], [place] [phone]) [phone], phone; [phone], fax Sees patients 1st and 3rd Saturday of every month.  Must not qualify for public or private insurance (i.e. Medicaid, Medicare, [place] Health Choice, Veterans' Benefits)  Household income should be no more than 200% of the poverty level The clinic cannot treat you if you are pregnant or think you are pregnant  Sexually transmitted diseases are not treated at the clinic.    Dental Care: Organization         Address  Phone  Notes  [facility] Department of [facility]  Health [facility] [address], [place] [phone]) [phone] Accepts children up to age 48 who are enrolled in [place]icaid or [place] Health Choice; pregnant women with a Medicaid card; and children who have applied for Medicaid or [place] Health Choice, but were declined, whose parents can pay a reduced fee at time of service.  [facility] Department of [facility] [place]  [address], [place] [phone]) [phone] Accepts children up to age 48 who are enrolled in [place]icaid or [place] Health Choice; pregnant women with a Medicaid card; and children who have applied for Medicaid or [place] Health Choice, but were declined, whose parents can pay a reduced fee at time of service.  [facility]  [address], [place] [phone] Patients are seen by appointment only. Walk-ins are not accepted. [facility] will see patients 48 years of age and older. Monday - Tuesday (8am-5pm) Most Wednesdays (8:30-5pm) $30 per visit, cash only  [facility]  [address], [facility] [phone] Patients are seen by appointment only. Walk-ins are not accepted. [facility] will see patients 48 years of age and older. One Wednesday Evening (Monthly: Volunteer Based).  $30 per visit, cash only  [employer] [facility] [facility]  [phone]) [phone] for adults; Children under age 444, call Graduate Pediatric Dentistry at [phone]. Children aged 344-14, please call [phone]) [phone] to request a pediatric application.  Dental services are provided in all areas of dental care including fillings, crowns and bridges, complete and partial dentures, implants, gum treatment, root canals, and extractions. Preventive care is also provided. Treatment is provided to both adults and children. Patients are selected via a lottery and there is often a waiting list.   [facility] [address], [place]  [phone]) [phone] [email or web address]   [facility] [address], [place], [place] [phone], Ext. [phone] Second and Fourth Thursday of each month, opens at 6:30 AM; Clinic ends at 9 AM.  Patients are seen on a first-come first-served basis, and a limited number are seen during each clinic.   [facility]  [address], [place], [place] [phone]) [phone]   Eligibility Requirements You must have lived in [place], [place], or [place] counties for at least the last three months.   You cannot be eligible for state or federal sponsored  healthcare insurance, including CIGNAVeterans Administration, [place]icaid, or Har[employer] EntertainmentMedicare.   You generally cannot be eligible for healthcare insurance through your employer.    How to apply: Eligibility screenings are held every Tuesday and Wednesday afternoon from 1:00 pm until 4:00 pm. You do not need an appointment for the interview!  [facility] [address], [place], [place] [phone]   [facility]  [phone]   [facility]  [phone]   [facility]  [phone]    Behavioral Health Resources in the Community: Intensive Outpatient Programs Organization         Address  Phone  Notes  [facility] [address], [place], [place] [phone]   [facility] [address], [place], [place] [phone]   [facility] [address], [place], [place]  [phone]   [facility] [address],  [place], [place] [phone] or [phone]   Substance Abuse Resources Organization         Address  Phone  Notes  [facility]  [phone]   [facility]  [phone]   [facility]  [phone]   [facility]  [phone]   Residential & Outpatient Substance Abuse Program  [phone]   Psychological Services Organization         Address  Phone  Notes  [facility]   [phone]- [phone]   [facility]  [phone]- [phone]   [facility] [address], [place] [phone] or [phone]    Mobile Crisis Teams Organization         Address  Phone  Notes  [facility], Mobile Crisis Care Unit  [phone]   [facility]  [address] [place], [place] [phone]   Doristine LocksSharon DeEsch [address] [place] [phone]    Self-Help/Support Groups Organization         Address  Phone             Notes  [facility] [place] - variety of support groups  336- [phone] Call for more information  Narcotics Anonymous (NA), [facility] [address], [place]  2 meetings at this location   Statisticianesidential Treatment Programs Organization         Address  Phone  Notes  [facility] [address],    [place]  [phone]   [facility]  [address], [place] [phone], [place], [place] [phone]   [facility] [address], [place] [phone] Admissions: 8am-3pm M-F  [facility] [address]    [place], [place] [phone]   [facility] [address], [place], [place] [phone]   [facility] [address]  [place], [place] [phone]   [facility] - Intensive Outpatient [address], [place], [place] [phone]   [facility] ([facility] [address]  [place], [place] [phone] or [phone]   [facility] ([facility]) [address] [place], [place] [phone] Accepts Medicaid  [facility] [address]  [place] [phone] Substance Abuse/Addiction Treatment   [facility] Behavioral Health Resources Organization         Address  Phone  Notes  [facility]  [phone]) [phone]   Angie FavaJulie Brannon, PhD [address] [place], [place]   [phone](336) [phone] or  (  [place]) [phone]   [facility]   [address] [place], [place] [phone]   [address], [place], [place] [phone] Insurance/Medicaid/sponsorship through [facility] and Families [address] [place], [place] [phone] [place] [address]   [place], [place] [phone]    Dr. Adele Schilder  [phone]   [facility] Dept. 1) [address], [place] 2) [place] 3)  [address], [place] [phone]  [phone]   [place] [phone] or [phone] (After Hours)

## 2014-05-16 NOTE — ED Provider Notes (Signed)
Medical screening examination/treatment/procedure(s) were performed by non-physician practitioner and as supervising physician I was immediately available for consultation/collaboration.   Zoii Florer T Emaley Applin, MD 05/16/14 1019 

## 2014-08-02 ENCOUNTER — Emergency Department (HOSPITAL_COMMUNITY)
Admission: EM | Admit: 2014-08-02 | Discharge: 2014-08-02 | Disposition: A | Discharge status: Home / Self Care | Payer: Self-pay | Attending: Emergency Medicine | Admitting: Emergency Medicine

## 2014-08-02 ENCOUNTER — Encounter (HOSPITAL_COMMUNITY): Payer: Self-pay | Admitting: Emergency Medicine

## 2014-08-02 ENCOUNTER — Emergency Department (HOSPITAL_COMMUNITY): Admission: EM | Admit: 2014-08-02 | Discharge: 2014-08-02 | Discharge status: Absent without leave | Payer: Self-pay

## 2014-08-02 DIAGNOSIS — M791 Myalgia: Secondary | ICD-10-CM | POA: Insufficient documentation

## 2014-08-02 DIAGNOSIS — J069 Acute upper respiratory infection, unspecified: Secondary | ICD-10-CM | POA: Insufficient documentation

## 2014-08-02 DIAGNOSIS — Z720 Tobacco use: Secondary | ICD-10-CM | POA: Insufficient documentation

## 2014-08-02 DIAGNOSIS — Z791 Long term (current) use of non-steroidal anti-inflammatories (NSAID): Secondary | ICD-10-CM | POA: Insufficient documentation

## 2014-08-02 LAB — URINALYSIS, ROUTINE W REFLEX MICROSCOPIC
BILIRUBIN URINE: NEGATIVE
Glucose, UA: NEGATIVE mg/dL
HGB URINE DIPSTICK: NEGATIVE
Ketones, ur: NEGATIVE mg/dL
Leukocytes, UA: NEGATIVE
NITRITE: NEGATIVE
PH: 8 (ref 5.0–8.0)
Protein, ur: NEGATIVE mg/dL
SPECIFIC GRAVITY, URINE: 1.015 (ref 1.005–1.030)
Urobilinogen, UA: 0.2 mg/dL (ref 0.0–1.0)

## 2014-08-02 NOTE — ED Notes (Signed)
Called pt three times for triage with no answer  

## 2014-08-02 NOTE — ED Provider Notes (Signed)
CSN: [id number]     Arrival date & time 08/02/14  1429 History   First MD Initiated Contact with Patient 08/02/14 1636     Chief Complaint  Patient presents with  . Flank Pain  . Sore Throat  . Nasal Congestion     (Consider location/radiation/quality/duration/timing/severity/associated sxs/prior Treatment) Patient is a 49 y.o. female presenting with URI.  URI Presenting symptoms: congestion, cough and sore throat   Severity:  Moderate Onset quality:  Gradual Duration:  3 days Timing:  Constant Progression:  Unchanged Chronicity:  New Relieved by: advil PM. Associated symptoms: myalgias and sinus pain   Associated symptoms comment:  Chills, myalgias, right flank pain.     History reviewed. No pertinent past medical history. History reviewed. No pertinent past surgical history. History reviewed. No pertinent family history. History  Substance Use Topics  . Smoking status: Current Every Day Smoker -- 0.50 packs/day    Types: Cigarettes  . Smokeless tobacco: Never Used  . Alcohol Use: No   OB History    No data available     Review of Systems  HENT: Positive for congestion and sore throat.   Respiratory: Positive for cough.   Musculoskeletal: Positive for myalgias.  All other systems reviewed and are negative.     Allergies  Review of patient's allergies indicates no known allergies.  Home Medications   Prior to Admission medications   Medication Sig Start Date End Date Taking? Authorizing Provider  acetaminophen (TYLENOL) 500 MG tablet Take 500 mg by mouth every 6 (six) hours as needed for headache.   Yes Historical Provider, MD  ibuprofen (ADVIL,MOTRIN) 800 MG tablet Take 1 tablet (800 mg total) by mouth 3 (three) times daily. 05/10/14  Yes Heather Laisure, PA-C  gabapentin (NEURONTIN) 100 MG capsule Take 1 pill on day one Take a pill with breakfast and lunch on day two Take a pill with breakfast lunch and dinner on day three and then continue to take 3  times daily Patient not taking: Reported on 08/02/2014 05/13/14   Courtney A Forcucci, PA-C  HYDROcodone-acetaminophen (NORCO/VICODIN) 5-325 MG per tablet Take 1-2 tablets by mouth every 4 (four) hours as needed. Patient not taking: Reported on 08/02/2014 05/10/14   Santiago Glad, PA-C  oxyCODONE-acetaminophen (PERCOCET/ROXICET) 5-325 MG per tablet Take 1-2 tablets by mouth every 6 (six) hours as needed for severe pain. Patient not taking: Reported on 08/02/2014 05/03/14   Juliet Rude. Pickering, MD   BP 117/76 mmHg  Pulse 88  Temp(Src) 98.3 F (36.8 C) (Oral)  Resp 20  SpO2 99% Physical Exam  Constitutional: She is oriented to person, place, and time. She appears well-developed and well-nourished. No distress.  HENT:  Head: Normocephalic and atraumatic.  Mouth/Throat: Oropharynx is clear and moist. No posterior oropharyngeal edema or posterior oropharyngeal erythema.  Eyes: Conjunctivae are normal. Pupils are equal, round, and reactive to light. No scleral icterus.  Neck: Neck supple.  Cardiovascular: Normal rate, regular rhythm, normal heart sounds and intact distal pulses.   No murmur heard. Pulmonary/Chest: Effort normal and breath sounds normal. No stridor. No respiratory distress. She has no rales.  Abdominal: Soft. Bowel sounds are normal. She exhibits no distension. There is no tenderness.  Musculoskeletal: Normal range of motion.       Back:  Neurological: She is alert and oriented to person, place, and time.  Skin: Skin is warm and dry. No rash noted.  Psychiatric: She has a normal mood and affect. Her behavior is normal.  Nursing note  and vitals reviewed.   ED Course  Procedures (including critical care time) Labs Review Labs Reviewed  URINALYSIS, ROUTINE W REFLEX MICROSCOPIC    Imaging Review No results found.   EKG Interpretation None      MDM   Final diagnoses:  Acute URI    49 yo female with URI symptoms for past several days.  Also has mild flank  pain, which I think is MSK in setting of a cough.  Vital signs are stable, she is well appearing, lungs are clear.  I don"t think she needs any further testing at this time . Reccommended supportive treatment.     Candyce ChurnJohn David Emslee Lopezmartinez III, MD 08/02/14 [id number]

## 2014-08-02 NOTE — Discharge Instructions (Signed)
Upper Respiratory Infection, Adult An upper respiratory infection (URI) is also sometimes known as the common cold. The upper respiratory tract includes the nose, sinuses, throat, trachea, and bronchi. Bronchi are the airways leading to the lungs. Most people improve within 1 week, but symptoms can last up to 2 weeks. A residual cough may last even longer.  CAUSES Many different viruses can infect the tissues lining the upper respiratory tract. The tissues become irritated and inflamed and often become very moist. Mucus production is also common. A cold is contagious. You can easily spread the virus to others by oral contact. This includes kissing, sharing a glass, coughing, or sneezing. Touching your mouth or nose and then touching a surface, which is then touched by another person, can also spread the virus. SYMPTOMS  Symptoms typically develop 1 to 3 days after you come in contact with a cold virus. Symptoms vary from person to person. They may include:  Runny nose.  Sneezing.  Nasal congestion.  Sinus irritation.  Sore throat.  Loss of voice (laryngitis).  Cough.  Fatigue.  Muscle aches.  Loss of appetite.  Headache.  Low-grade fever. DIAGNOSIS  You might diagnose your own cold based on familiar symptoms, since most people get a cold 2 to 3 times a year. Your caregiver can confirm this based on your exam. Most importantly, your caregiver can check that your symptoms are not due to another disease such as strep throat, sinusitis, pneumonia, asthma, or epiglottitis. Blood tests, throat tests, and X-rays are not necessary to diagnose a common cold, but they may sometimes be helpful in excluding other more serious diseases. Your caregiver will decide if any further tests are required. RISKS AND COMPLICATIONS  You may be at risk for a more severe case of the common cold if you smoke cigarettes, have chronic heart disease (such as heart failure) or lung disease (such as asthma), or if  you have a weakened immune system. The very young and very old are also at risk for more serious infections. Bacterial sinusitis, middle ear infections, and bacterial pneumonia can complicate the common cold. The common cold can worsen asthma and chronic obstructive pulmonary disease (COPD). Sometimes, these complications can require emergency medical care and may be life-threatening. PREVENTION  The best way to protect against getting a cold is to practice good hygiene. Avoid oral or hand contact with people with cold symptoms. Wash your hands often if contact occurs. There is no clear evidence that vitamin C, vitamin E, echinacea, or exercise reduces the chance of developing a cold. However, it is always recommended to get plenty of rest and practice good nutrition. TREATMENT  Treatment is directed at relieving symptoms. There is no cure. Antibiotics are not effective, because the infection is caused by a virus, not by bacteria. Treatment may include:  Increased fluid intake. Sports drinks offer valuable electrolytes, sugars, and fluids.  Breathing heated mist or steam (vaporizer or shower).  Eating chicken soup or other clear broths, and maintaining good nutrition.  Getting plenty of rest.  Using gargles or lozenges for comfort.  Controlling fevers with ibuprofen or acetaminophen as directed by your caregiver.  Increasing usage of your inhaler if you have asthma. Zinc gel and zinc lozenges, taken in the first 24 hours of the common cold, can shorten the duration and lessen the severity of symptoms. Pain medicines may help with fever, muscle aches, and throat pain. A variety of non-prescription medicines are available to treat congestion and runny nose. Your caregiver   can make recommendations and may suggest nasal or lung inhalers for other symptoms.  HOME CARE INSTRUCTIONS   Only take over-the-counter or prescription medicines for pain, discomfort, or fever as directed by your  caregiver.  Use a warm mist humidifier or inhale steam from a shower to increase air moisture. This may keep secretions moist and make it easier to breathe.  Drink enough water and fluids to keep your urine clear or pale yellow.  Rest as needed.  Return to work when your temperature has returned to normal or as your caregiver advises. You may need to stay home longer to avoid infecting others. You can also use a face mask and careful hand washing to prevent spread of the virus. SEEK MEDICAL CARE IF:   After the first few days, you feel you are getting worse rather than better.  You need your caregiver's advice about medicines to control symptoms.  You develop chills, worsening shortness of breath, or brown or red sputum. These may be signs of pneumonia.  You develop yellow or brown nasal discharge or pain in the face, especially when you bend forward. These may be signs of sinusitis.  You develop a fever, swollen neck glands, pain with swallowing, or white areas in the back of your throat. These may be signs of strep throat. SEEK IMMEDIATE MEDICAL CARE IF:   You have a fever.  You develop severe or persistent headache, ear pain, sinus pain, or chest pain.  You develop wheezing, a prolonged cough, cough up blood, or have a change in your usual mucus (if you have chronic lung disease).  You develop sore muscles or a stiff neck. Document Released: 12/30/2000 Document Revised: 09/28/2011 Document Reviewed: 10/11/2013 [employer] Patient Information 2015 [employer]. This information is not intended to replace advice given to you by your health care provider. Make sure you discuss any questions you have with your health care provider.  

## 2014-08-02 NOTE — ED Notes (Signed)
Pt c/o sharp right flank pain, denies urinary symptoms, also c/o nasal drainage, throat pains, body aches, burning in her ears, loss of appetite, all of which she states are worse at night time.

## 2014-10-14 ENCOUNTER — Emergency Department (HOSPITAL_COMMUNITY)
Admission: EM | Admit: 2014-10-14 | Discharge: 2014-10-15 | Disposition: A | Discharge status: Home / Self Care | Payer: Self-pay | Attending: Emergency Medicine | Admitting: Emergency Medicine

## 2014-10-14 ENCOUNTER — Encounter (HOSPITAL_COMMUNITY): Payer: Self-pay | Admitting: Emergency Medicine

## 2014-10-14 DIAGNOSIS — Z79899 Other long term (current) drug therapy: Secondary | ICD-10-CM | POA: Insufficient documentation

## 2014-10-14 DIAGNOSIS — R1032 Left lower quadrant pain: Secondary | ICD-10-CM | POA: Insufficient documentation

## 2014-10-14 DIAGNOSIS — M545 Low back pain: Secondary | ICD-10-CM

## 2014-10-14 DIAGNOSIS — M5440 Lumbago with sciatica, unspecified side: Secondary | ICD-10-CM | POA: Insufficient documentation

## 2014-10-14 DIAGNOSIS — R1030 Lower abdominal pain, unspecified: Secondary | ICD-10-CM

## 2014-10-14 DIAGNOSIS — R112 Nausea with vomiting, unspecified: Secondary | ICD-10-CM | POA: Insufficient documentation

## 2014-10-14 DIAGNOSIS — R1031 Right lower quadrant pain: Secondary | ICD-10-CM | POA: Insufficient documentation

## 2014-10-14 DIAGNOSIS — Z720 Tobacco use: Secondary | ICD-10-CM | POA: Insufficient documentation

## 2014-10-14 DIAGNOSIS — R197 Diarrhea, unspecified: Secondary | ICD-10-CM | POA: Insufficient documentation

## 2014-10-14 MED ORDER — SODIUM CHLORIDE 0.9 % IV BOLUS (SEPSIS)
1000.0000 mL | Freq: Once | INTRAVENOUS | Status: AC
  Administered 2014-10-14: 1000 mL via INTRAVENOUS

## 2014-10-14 MED ORDER — ONDANSETRON HCL 4 MG/2ML IJ SOLN
4.0000 mg | Freq: Once | INTRAMUSCULAR | Status: AC
  Administered 2014-10-14: 4 mg via INTRAVENOUS
  Filled 2014-10-14: qty 2

## 2014-10-14 MED ORDER — HYDROMORPHONE HCL 1 MG/ML IJ SOLN
1.0000 mg | Freq: Once | INTRAMUSCULAR | Status: AC
Start: 2014-10-14 — End: 2014-10-14
  Administered 2014-10-14: 1 mg via INTRAVENOUS
  Filled 2014-10-14: qty 1

## 2014-10-14 MED ORDER — DICYCLOMINE HCL 10 MG/ML IM SOLN
20.0000 mg | Freq: Once | INTRAMUSCULAR | Status: AC
  Administered 2014-10-14: 20 mg via INTRAMUSCULAR
  Filled 2014-10-14: qty 2

## 2014-10-14 NOTE — ED Notes (Signed)
Patient c/o lower abdominal/lower back pain onset 1400, no relief with home meds, 10/10. Nausea, no vomiting. Denies urinary symptoms.

## 2014-10-14 NOTE — ED Provider Notes (Signed)
CSN: [id number]     Arrival date & time 10/14/14  2315 History  This chart was scribed for non-physician practitioner, Antony MaduraKelly Kristine Chahal, PA working with Loren Raceravid Yelverton, MD by Gwenyth Oberatherine Macek, ED scribe. This patient was seen in room WA14/WA14 and the patient's care was started at 11:36 PM  Chief Complaint  Patient presents with  . Back Pain  . Abdominal Pain   The history is provided by the patient. No language interpreter was used.    HPI Comments: Kerry Young is a 49 y.o. female who is post-menopausal, with no chronic medical history, and presents to the Emergency Department complaining of constant, 10/10, aching and throbbing lower back and abdominal pain that 4 days ago and became worse today. She states vomiting that started 3 days ago, but is currently resolved, and watery diarrhea as associated symptoms. Pt has tried an antiemetic and Tylenol with some relief. Pt was evaluated by her PCP 2 days ago who diagnosed her with a stomach virus. She denies a history of abdominal surgeries and recent travel. She does report a hx of left low back pain which flares intermittently. She states "I was told I sometimes get muscle spasms in my back". Pt also denies fever, hematuria, dysuria, vaginal bleeding, vaginal discharge, decreased appetite, bladder incontinence and bowel incontinence as associated symptoms.  History reviewed. No pertinent past medical history. History reviewed. No pertinent past surgical history. No family history on file. History  Substance Use Topics  . Smoking status: Current Every Day Smoker -- 0.50 packs/day    Types: Cigarettes  . Smokeless tobacco: Never Used  . Alcohol Use: No   OB History    No data available      Review of Systems  Constitutional: Negative for fever and appetite change.  Gastrointestinal: Positive for nausea, vomiting, abdominal pain and diarrhea. Negative for blood in stool.  Genitourinary: Negative for dysuria, hematuria, vaginal bleeding and  vaginal discharge.  All other systems reviewed and are negative.   Allergies  Review of patient's allergies indicates no known allergies.  Home Medications   Prior to Admission medications   Medication Sig Start Date End Date Taking? Authorizing Provider  acetaminophen (TYLENOL) 500 MG tablet Take 1,500 mg by mouth every 6 (six) hours as needed for moderate pain or headache.    Yes Historical Provider, MD  diphenhydrAMINE (BENADRYL) 25 MG tablet Take 50 mg by mouth every 6 (six) hours as needed for allergies.   Yes Historical Provider, MD  ibuprofen (ADVIL,MOTRIN) 200 MG tablet Take 400-600 mg by mouth every 6 (six) hours as needed for moderate pain.   Yes Historical Provider, MD  naproxen sodium (ANAPROX) 550 MG tablet Take 550 mg by mouth 2 (two) times daily as needed for moderate pain.   Yes Historical Provider, MD  OVER THE COUNTER MEDICATION Apply 1 application topically 2 (two) times daily as needed. Muscle rub cream--pain   Yes Historical Provider, MD  promethazine (PHENERGAN) 25 MG tablet Take 25 mg by mouth every 6 (six) hours as needed for nausea or vomiting.   Yes Historical Provider, MD  trolamine salicylate (ASPERCREME) 10 % cream Apply 1 application topically 2 (two) times daily as needed for muscle pain.   Yes Historical Provider, MD  gabapentin (NEURONTIN) 100 MG capsule Take 1 pill on day one Take a pill with breakfast and lunch on day two Take a pill with breakfast lunch and dinner on day three and then continue to take 3 times daily Patient not taking:  Reported on 08/02/2014 05/13/14   Terri Piedra, PA-C  HYDROcodone-acetaminophen (NORCO/VICODIN) 5-325 MG per tablet Take 1-2 tablets by mouth every 4 (four) hours as needed. Patient not taking: Reported on 08/02/2014 05/10/14   Santiago Glad, PA-C  ibuprofen (ADVIL,MOTRIN) 800 MG tablet Take 1 tablet (800 mg total) by mouth 3 (three) times daily. Patient not taking: Reported on 10/15/2014 05/10/14   Santiago Glad, PA-C   oxyCODONE-acetaminophen (PERCOCET/ROXICET) 5-325 MG per tablet Take 1-2 tablets by mouth every 6 (six) hours as needed for severe pain. Patient not taking: Reported on 08/02/2014 05/03/14   Benjiman Core, MD   BP 98/72 mmHg  Pulse 74  Temp(Src) 98 F (36.7 C) (Oral)  Resp 18  Ht  (1.575 m)  Wt 176 lb (79.833 kg)  BMI 32.18 kg/m2  SpO2 95%  LMP    Physical Exam  Constitutional: She is oriented to person, place, and time. She appears well-developed and well-nourished. No distress.  Nontoxic/nonseptic appearing  HENT:  Head: Normocephalic and atraumatic.  Eyes: Conjunctivae and EOM are normal. No scleral icterus.  Neck: Normal range of motion.  Cardiovascular: Normal rate, regular rhythm and normal heart sounds.   Pulmonary/Chest: Effort normal and breath sounds normal. No respiratory distress. She has no wheezes. She has no rales.  Lungs CTAB  Abdominal: Soft. She exhibits no distension. There is tenderness. There is no rebound and no guarding.  TTP in the LLQ and RLQ without masses. Abdomen soft. No peritoneal signs noted.  Musculoskeletal: Normal range of motion. She exhibits tenderness.  TTP to b/l lumbar paraspinal muscles. No bony deformities, step offs, or crepitus to the lumbar midline.  Neurological: She is alert and oriented to person, place, and time. She exhibits normal muscle tone. Coordination normal.  GCS 15. Patient moves extremities without ataxia.  Skin: Skin is warm and dry. No rash noted. She is not diaphoretic. No erythema. No pallor.  Psychiatric: She has a normal mood and affect. Her behavior is normal.  Nursing note and vitals reviewed.   ED Course  Procedures   DIAGNOSTIC STUDIES: Oxygen Saturation is 98% on RA, normal by my interpretation.    COORDINATION OF CARE: 11:41 PM Discussed treatment plan with pt which includes CT Abdomen Pelvis. Pt agreed to plan.  Labs Review Labs Reviewed  COMPREHENSIVE METABOLIC PANEL - Abnormal; Notable for  the following:    Glucose, Bld 108 (*)    All other components within normal limits  URINALYSIS, ROUTINE W REFLEX MICROSCOPIC - Abnormal; Notable for the following:    APPearance CLOUDY (*)    Leukocytes, UA SMALL (*)    All other components within normal limits  URINE MICROSCOPIC-ADD ON - Abnormal; Notable for the following:    Bacteria, UA MANY (*)    All other components within normal limits  I-STAT CG4 LACTIC ACID, ED - Abnormal; Notable for the following:    Lactic Acid, Venous 0.39 (*)    All other components within normal limits  URINE CULTURE  CBC WITH DIFFERENTIAL/PLATELET  LIPASE, BLOOD  I-STAT CG4 LACTIC ACID, ED  I-STAT BETA HCG BLOOD, ED ([facility], [facility], [facility] ONLY)    Imaging Review Ct Abdomen Pelvis W Contrast  10/15/2014   CLINICAL DATA:  Low back and abdominal pain for 4 days, increasing today. Nausea, vomiting, diarrhea.  EXAM: CT ABDOMEN AND PELVIS WITH CONTRAST  TECHNIQUE: Multidetector CT imaging of the abdomen and pelvis was performed using the standard protocol following bolus administration of intravenous contrast.  CONTRAST:  OMNIPAQUE  IOHEXOL 300 MG/ML  SOLN  COMPARISON:  CT 10/12/2009  FINDINGS: The included lung bases are clear.  The liver, gallbladder, spleen, pancreas, and adrenal glands are normal. Kidneys demonstrate symmetric enhancement and excretion. No hydronephrosis or perinephric stranding. Stomach physiologically distended. There are no dilated or thickened bowel loops. The appendix is normal. There is a moderate volume of colonic stool. A few distal colonic diverticula without diverticulitis. No free air, free fluid, or intra-abdominal fluid collection.  Abdominal aorta is normal in caliber. No retroperitoneal adenopathy.  Within the pelvis, the uterus is normal for age. No adnexal mass. Bladder is physiologically distended. No pelvic free fluid  There are no acute or suspicious osseous abnormalities. Incidental note of 4 non-rib-bearing lumbar vertebra in  transitional lumbosacral anatomy there is facet arthropathy in the lower lumbar spine.  IMPRESSION: No acute abnormality in the abdomen/pelvis.   Electronically Signed   By: Rubye Oaks M.D.   On: 10/15/2014 01:50     EKG Interpretation None      MDM   Final diagnoses:  Bilateral low back pain, with sciatica presence unspecified  Lower abdominal pain    49 year old female presents to the emergency department for further evaluation of lower abdominal pain and low back pain which began at 1400 yesterday. Patient is afebrile and hemodynamically stable over ED course. She has tenderness to palpation to both her lower abdomen and low back. No peritoneal signs or masses appreciated. Patient with a reassuring workup today. No leukocytosis, anemia, or electrolyte imbalance. Liver and kidney function preserved. Lactate normal. Urinalysis does not suggest infection.  Given degree of tenderness, CT obtained which shows no abnormality in the abdomen or pelvis. Patient had a normal caliber abdominal aorta as well as normal appearing uterus and adnexa. Appendix is normal. Symptoms well controlled with Dilaudid, Bentyl, Zofran, and Toradol. Patient has an improved abdominal reexamination. She has no red flags or signs concerning for cauda equina today.  Doubt emergent cause of symptoms. Question abdominal cramping from resolving viral process, based on history of V/D, vs low back pain/muscle spasms radiating to the low abdomen. Patient does report hx of low back spasms. Plan to d/c with short course of Percocet and Robaxin. PCP f/u advised and return precautions given. Patient agreeable to plan with no unaddressed concerns. Patient discharged in good condition.  I personally performed the services described in this documentation, which was scribed in my presence. The recorded information has been reviewed and is accurate.   Filed Vitals:   10/14/14 2322 10/15/14 0156  BP: 117/83 98/72  Pulse: 89 74   Temp: 98 F (36.7 C) 98 F (36.7 C)  TempSrc: Oral Oral  Resp: 18 18  Height:  (1.575 m)   Weight: 176 lb (79.833 kg)   SpO2: 98% 95%     Antony Madura, PA-C 10/15/14 0320  Loren Racer, MD 10/27/14 [phone]

## 2014-10-15 ENCOUNTER — Encounter (HOSPITAL_COMMUNITY): Payer: Self-pay

## 2014-10-15 ENCOUNTER — Emergency Department (HOSPITAL_COMMUNITY): Payer: Self-pay

## 2014-10-15 LAB — CBC WITH DIFFERENTIAL/PLATELET
BASOS ABS: 0.1 10*3/uL (ref 0.0–0.1)
Basophils Relative: 1 % (ref 0–1)
EOS ABS: 0.3 10*3/uL (ref 0.0–0.7)
Eosinophils Relative: 4 % (ref 0–5)
HCT: 43.8 % (ref 36.0–46.0)
HEMOGLOBIN: 14.5 g/dL (ref 12.0–15.0)
LYMPHS ABS: 3 10*3/uL (ref 0.7–4.0)
LYMPHS PCT: 32 % (ref 12–46)
MCH: 29.4 pg (ref 26.0–34.0)
MCHC: 33.1 g/dL (ref 30.0–36.0)
MCV: 88.8 fL (ref 78.0–100.0)
MONOS PCT: 11 % (ref 3–12)
Monocytes Absolute: 1 10*3/uL (ref 0.1–1.0)
NEUTROS ABS: 4.9 10*3/uL (ref 1.7–7.7)
NEUTROS PCT: 52 % (ref 43–77)
PLATELETS: 321 10*3/uL (ref 150–400)
RBC: 4.93 MIL/uL (ref 3.87–5.11)
RDW: 13.3 % (ref 11.5–15.5)
WBC: 9.4 10*3/uL (ref 4.0–10.5)

## 2014-10-15 LAB — COMPREHENSIVE METABOLIC PANEL
ALT: 17 U/L (ref 0–35)
ANION GAP: 7 (ref 5–15)
AST: 21 U/L (ref 0–37)
Albumin: 3.9 g/dL (ref 3.5–5.2)
Alkaline Phosphatase: 57 U/L (ref 39–117)
BUN: 10 mg/dL (ref 6–23)
CALCIUM: 9.3 mg/dL (ref 8.4–10.5)
CO2: 25 mmol/L (ref 19–32)
Chloride: 106 mmol/L (ref 96–112)
Creatinine, Ser: 0.68 mg/dL (ref 0.50–1.10)
GFR calc non Af Amer: >90 mL/min (ref >90)
GLUCOSE: 108 mg/dL — AB (ref 70–99)
Potassium: 4.1 mmol/L (ref 3.5–5.1)
SODIUM: 138 mmol/L (ref 135–145)
TOTAL PROTEIN: 7.1 g/dL (ref 6.0–8.3)
Total Bilirubin: 0.6 mg/dL (ref 0.3–1.2)

## 2014-10-15 LAB — URINALYSIS, ROUTINE W REFLEX MICROSCOPIC
Bilirubin Urine: NEGATIVE
Glucose, UA: NEGATIVE mg/dL
Hgb urine dipstick: NEGATIVE
Ketones, ur: NEGATIVE mg/dL
NITRITE: NEGATIVE
Protein, ur: NEGATIVE mg/dL
SPECIFIC GRAVITY, URINE: 1.018 (ref 1.005–1.030)
UROBILINOGEN UA: 0.2 mg/dL (ref 0.0–1.0)
pH: 6 (ref 5.0–8.0)

## 2014-10-15 LAB — URINE MICROSCOPIC-ADD ON

## 2014-10-15 LAB — LIPASE, BLOOD: LIPASE: 28 U/L (ref 11–59)

## 2014-10-15 LAB — I-STAT BETA HCG BLOOD, ED (MC, WL, AP ONLY)

## 2014-10-15 LAB — I-STAT CG4 LACTIC ACID, ED
LACTIC ACID, VENOUS: 0.39 mmol/L — AB (ref 0.5–2.0)
LACTIC ACID, VENOUS: 1.16 mmol/L (ref 0.5–2.0)

## 2014-10-15 IMAGING — CT CT ABD-PELV W/ CM
1 of 4 series · 14 of 32 positions shown, 19 images · IV contrast (100 ML OMNI 300)
Comparison: CT [DATE]

CLINICAL DATA: Low back and abdominal pain for 4 days, increasing
today. Nausea, vomiting, diarrhea.

EXAM:
CT ABDOMEN AND PELVIS WITH CONTRAST
TECHNIQUE: Multidetector CT imaging of the abdomen and pelvis was performed
using the standard protocol following bolus administration of
intravenous contrast.
CONTRAST:  100mL OMNIPAQUE IOHEXOL 300 MG/ML  SOLN

[Series 2: abd/pel with · axial · 0.74mm/px · z∈[+1065,+1430]mm · 14 of 83 slices shown, 19 images]
[im 5/83  soft-tissue]
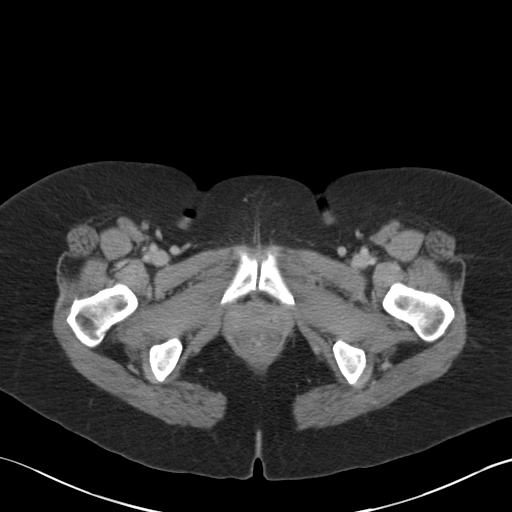
[im 5/83  bone]
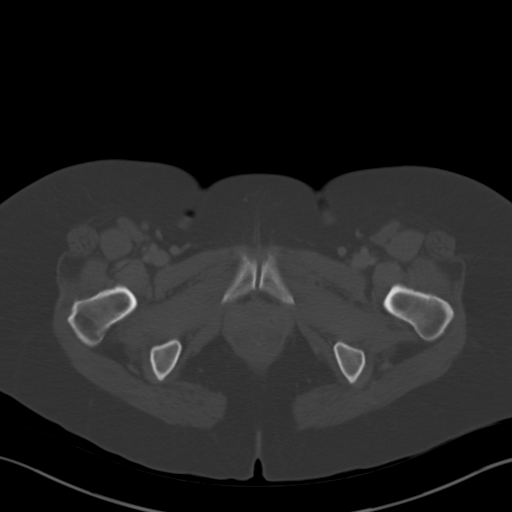
[im 10/83  soft-tissue]
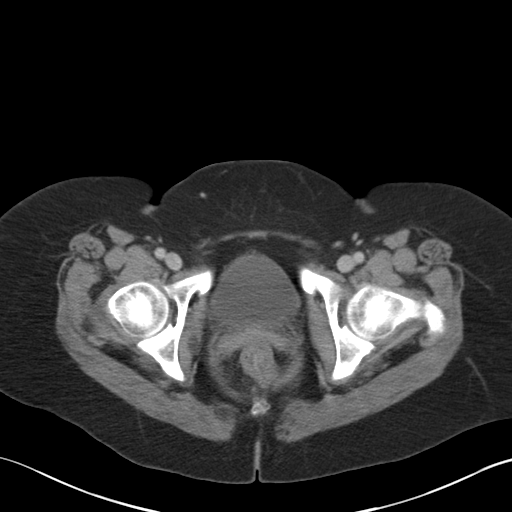
[im 19/83  soft-tissue]
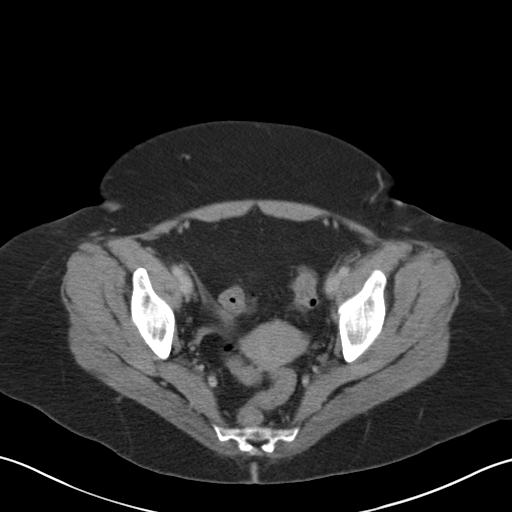
[im 23/83  soft-tissue]
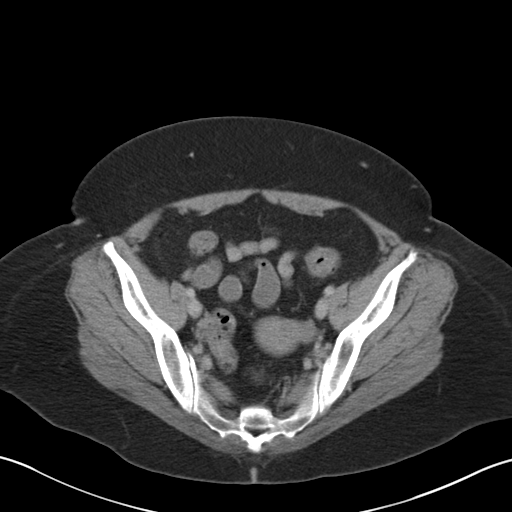
[im 28/83  soft-tissue]
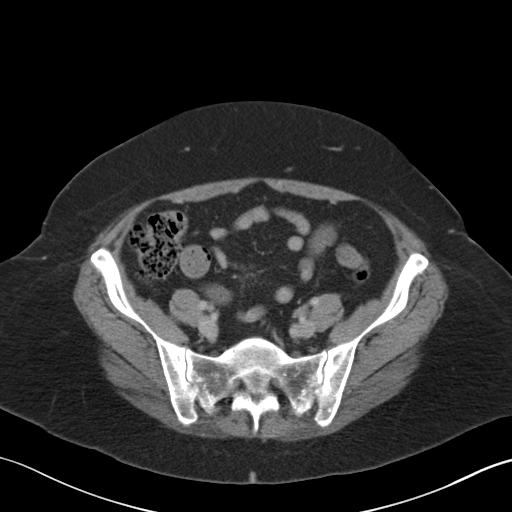
[im 37/83  soft-tissue]
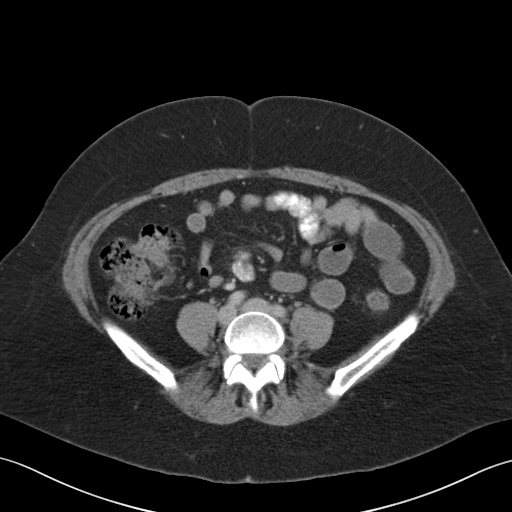
[im 42/83  soft-tissue]
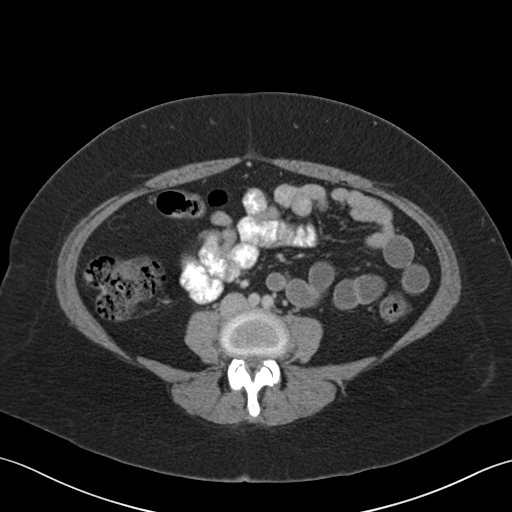
[im 46/83  soft-tissue]
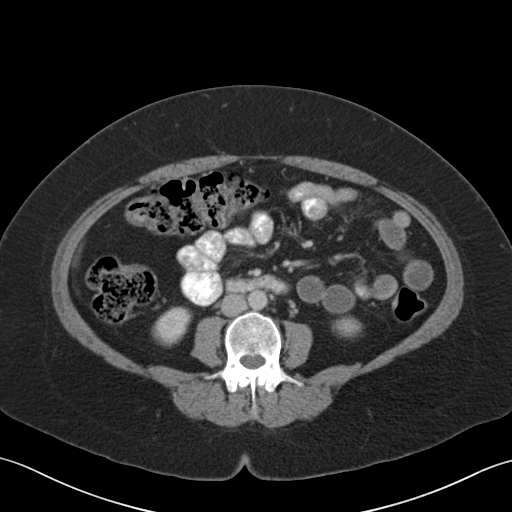
[im 55/83  soft-tissue]
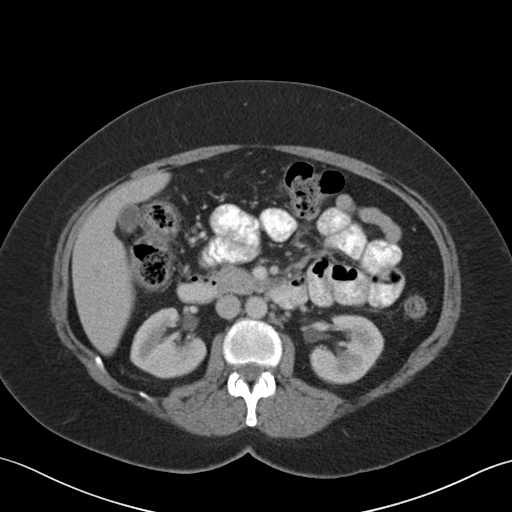
[im 55/83  bone]
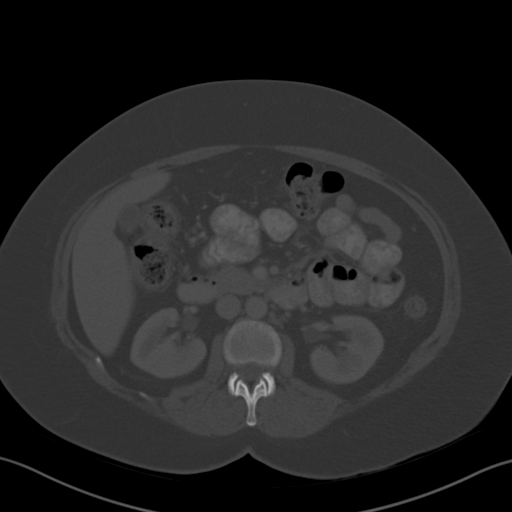
[im 60/83  soft-tissue]
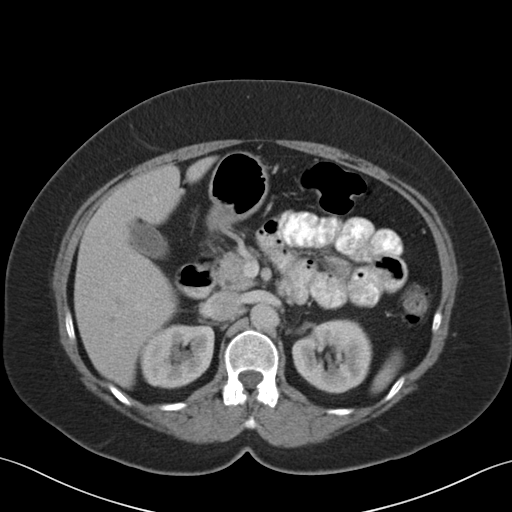
[im 64/83  soft-tissue]
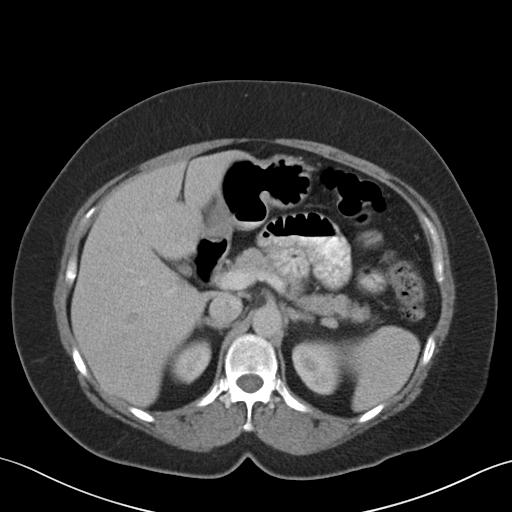
[im 64/83  lung]
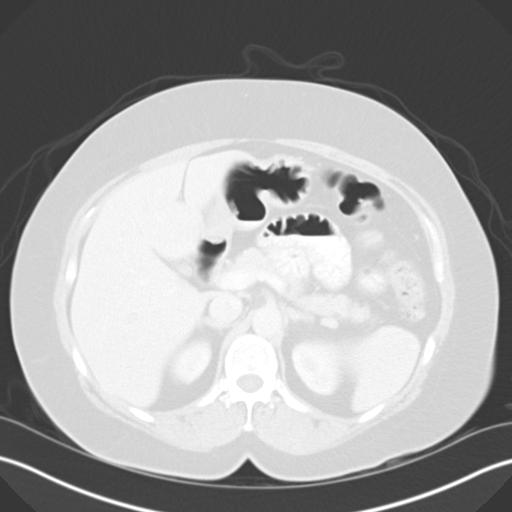
[im 69/83  lung]
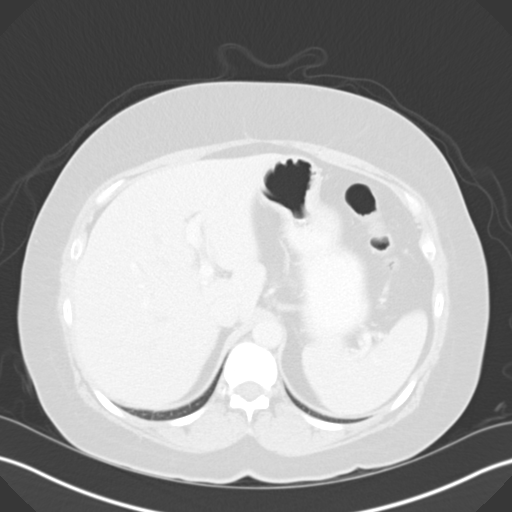
[im 73/83  soft-tissue]
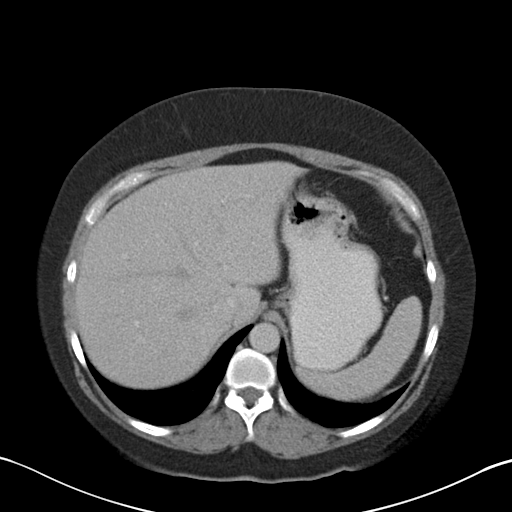
[im 73/83  lung]
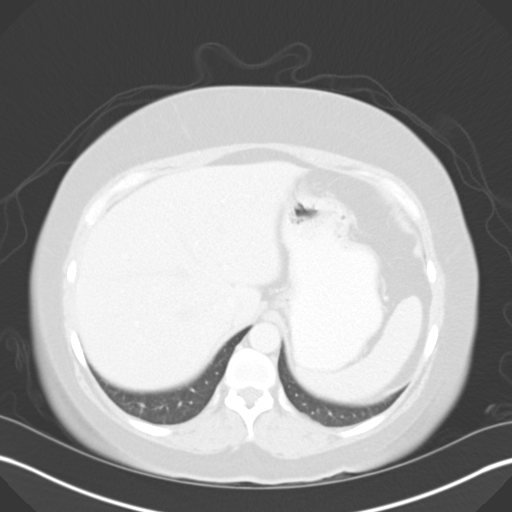
[im 78/83  soft-tissue]
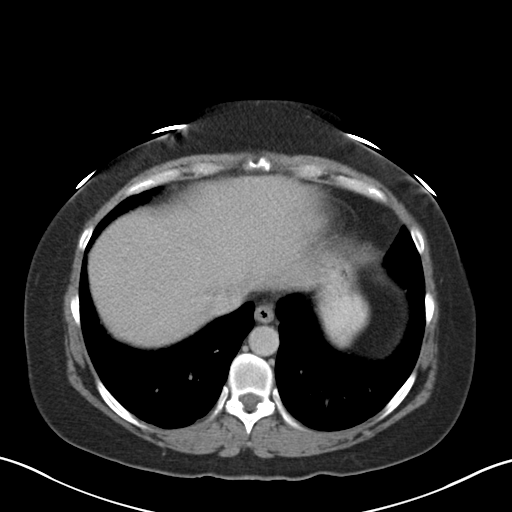
[im 78/83  lung]
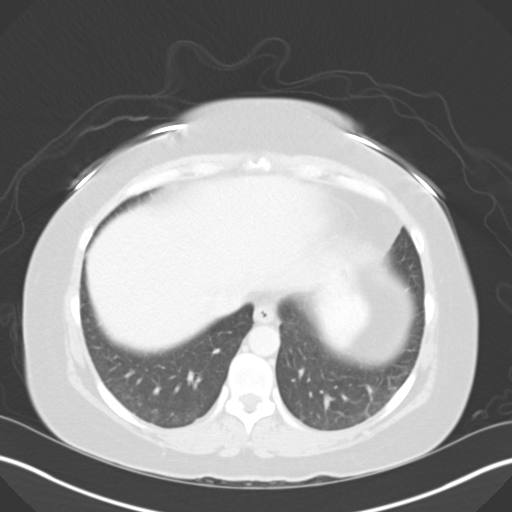

[14 of 32 positions shown; findings below may reference images not displayed]

FINDINGS: The included lung bases are clear.

The liver, gallbladder, spleen, pancreas, and adrenal glands are
normal. Kidneys demonstrate symmetric enhancement and excretion. No
hydronephrosis or perinephric stranding. Stomach physiologically
distended. There are no dilated or thickened bowel loops. The
appendix is normal. There is a moderate volume of colonic stool. A
few distal colonic diverticula without diverticulitis. No free air,
free fluid, or intra-abdominal fluid collection.

Abdominal aorta is normal in caliber. No retroperitoneal adenopathy.

Within the pelvis, the uterus is normal for age. No adnexal mass.
Bladder is physiologically distended. No pelvic free fluid

There are no acute or suspicious osseous abnormalities. Incidental
note of 4 non-rib-bearing lumbar vertebra in transitional
lumbosacral anatomy there is facet arthropathy in the lower lumbar
spine.
IMPRESSION: No acute abnormality in the abdomen/pelvis.

## 2014-10-15 MED ORDER — IOHEXOL 300 MG/ML  SOLN
100.0000 mL | Freq: Once | INTRAMUSCULAR | Status: AC | PRN
  Administered 2014-10-15: 100 mL via INTRAVENOUS

## 2014-10-15 MED ORDER — OXYCODONE-ACETAMINOPHEN 5-325 MG PO TABS: 1.0000 | ORAL_TABLET | Freq: Four times a day (QID) | ORAL | Status: DC | PRN

## 2014-10-15 MED ORDER — METHOCARBAMOL 500 MG PO TABS: 500.0000 mg | ORAL_TABLET | Freq: Two times a day (BID) | ORAL | Status: DC

## 2014-10-15 MED ORDER — KETOROLAC TROMETHAMINE 30 MG/ML IJ SOLN
30.0000 mg | Freq: Once | INTRAMUSCULAR | Status: AC
  Administered 2014-10-15: 30 mg via INTRAVENOUS
  Filled 2014-10-15: qty 1

## 2014-10-15 MED ORDER — IOHEXOL 300 MG/ML  SOLN
50.0000 mL | Freq: Once | INTRAMUSCULAR | Status: AC | PRN
  Administered 2014-10-15: 25 mL via ORAL

## 2014-10-15 NOTE — ED Notes (Signed)
Pt reports abd pain that began approx 1400 today, pt admits to being diagnosed w/ a "stomach virus" last Friday when patient was experiencing n/v/d. Pt denies any recent n/v/d or fever. Pt also c/o lower back pain - denies urinary symptoms, vaginal discharge or bleeding. Pt in no acute distress, A&Ox4

## 2014-10-15 NOTE — Discharge Instructions (Signed)
Recommend follow up with your primary care doctor in 2-3 days. Take Percocet as needed for severe pain and Ibuprofen 600mg  every 6 hours for mild-moderate pain. Take Robaxin for muscle spasms. Return to the ED as needed if symptoms worsen.  Back Pain, Adult Low back pain is very common. About 1 in 5 people have back pain.The cause of low back pain is rarely dangerous. The pain often gets better over time.About half of people with a sudden onset of back pain feel better in just 2 weeks. About 8 in 10 people feel better by 6 weeks.  CAUSES Some common causes of back pain include:  Strain of the muscles or ligaments supporting the spine.  Wear and tear (degeneration) of the spinal discs.  Arthritis.  Direct injury to the back. DIAGNOSIS Most of the time, the direct cause of low back pain is not known.However, back pain can be treated effectively even when the exact cause of the pain is unknown.Answering your caregiver's questions about your overall health and symptoms is one of the most accurate ways to make sure the cause of your pain is not dangerous. If your caregiver needs more information, he or she may order lab work or imaging tests (X-rays or MRIs).However, even if imaging tests show changes in your back, this usually does not require surgery. HOME CARE INSTRUCTIONS For many people, back pain returns.Since low back pain is rarely dangerous, it is often a condition that people can learn [facility].   Remain active. It is stressful on the back to sit or stand in one place. Do not sit, drive, or stand in one place for more than 30 minutes at a time. Take short walks on level surfaces as soon as pain allows.Try to increase the length of time you walk each day.  Do not stay in bed.Resting more than 1 or 2 days can delay your recovery.  Do not avoid exercise or work.Your body is made to move.It is not dangerous to be active, even though your back may hurt.Your back will  likely heal faster if you return to being active before your pain is gone.  Pay attention to your body when you bend and lift. Many people have less discomfortwhen lifting if they bend their knees, keep the load close to their bodies,and avoid twisting. Often, the most comfortable positions are those that put less stress on your recovering back.  Find a comfortable position to sleep. Use a firm mattress and lie on your side with your knees slightly bent. If you lie on your back, put a pillow under your knees.  Only take over-the-counter or prescription medicines as directed by your caregiver. Over-the-counter medicines to reduce pain and inflammation are often the most helpful.Your caregiver may prescribe muscle relaxant drugs.These medicines help dull your pain so you can more quickly return to your normal activities and healthy exercise.  Put ice on the injured area.  Put ice in a plastic bag.  Place a towel between your skin and the bag.  Leave the ice on for 15-20 minutes, 03-04 times a day for the first 2 to 3 days. After that, ice and heat may be alternated to reduce pain and spasms.  Ask your caregiver about trying back exercises and gentle massage. This may be of some benefit.  Avoid feeling anxious or stressed.Stress increases muscle tension and can worsen back pain.It is important to recognize when you are anxious or stressed and learn ways to manage it.Exercise is a  great option. SEEK MEDICAL CARE IF:  You have pain that is not relieved with rest or medicine.  You have pain that does not improve in 1 week.  You have new symptoms.  You are generally not feeling well. SEEK IMMEDIATE MEDICAL CARE IF:   You have pain that radiates from your back into your legs.  You develop new bowel or bladder control problems.  You have unusual weakness or numbness in your arms or legs.  You develop nausea or vomiting.  You develop abdominal pain.  You feel faint. Document  Released: 07/06/2005 Document Revised: 01/05/2012 Document Reviewed: 11/07/2013 [facility] Patient Information 2015 [place], [place]. This information is not intended to replace advice given to you by your health care provider. Make sure you discuss any questions you have with your health care provider.   Abdominal Pain Many things can cause abdominal pain. Usually, abdominal pain is not caused by a disease and will improve without treatment. It can often be observed and treated at home. Your health care provider will do a physical exam and possibly order blood tests and X-rays to help determine the seriousness of your pain. However, in many cases, more time must pass before a clear cause of the pain can be found. Before that point, your health care provider may not know if you need more testing or further treatment. HOME CARE INSTRUCTIONS  Monitor your abdominal pain for any changes. The following actions may help to alleviate any discomfort you are experiencing:  Only take over-the-counter or prescription medicines as directed by your health care provider.  Do not take laxatives unless directed to do so by your health care provider.  Try a clear liquid diet (broth, tea, or water) as directed by your health care provider. Slowly move to a bland diet as tolerated. SEEK MEDICAL CARE IF:  You have unexplained abdominal pain.  You have abdominal pain associated with nausea or diarrhea.  You have pain when you urinate or have a bowel movement.  You experience abdominal pain that wakes you in the night.  You have abdominal pain that is worsened or improved by eating food.  You have abdominal pain that is worsened with eating fatty foods.  You have a fever. SEEK IMMEDIATE MEDICAL CARE IF:   Your pain does not go away within 2 hours.  You keep throwing up (vomiting).  Your pain is felt only in portions of the abdomen, such as the right side or the left lower portion of the abdomen.  You  pass bloody or black tarry stools. MAKE SURE YOU:  Understand these instructions.   Will watch your condition.   Will get help right away if you are not doing well or get worse.  Document Released: 04/15/2005 Document Revised: 07/11/2013 Document Reviewed: 03/15/2013 [facility] Patient Information 2015 [place], [place]. This information is not intended to replace advice given to you by your health care provider. Make sure you discuss any questions you have with your health care provider.

## 2014-10-15 NOTE — ED Notes (Signed)
Pt ambulating independently w/ steady gait on d/c in no acute distress, A&Ox4. Rx given x2  

## 2014-10-16 LAB — URINE CULTURE

## 2014-12-29 ENCOUNTER — Emergency Department (HOSPITAL_COMMUNITY)
Admission: EM | Admit: 2014-12-29 | Discharge: 2014-12-30 | Disposition: A | Discharge status: Home / Self Care | Payer: Self-pay | Attending: Emergency Medicine | Admitting: Emergency Medicine

## 2014-12-29 ENCOUNTER — Encounter (HOSPITAL_COMMUNITY): Payer: Self-pay | Admitting: Emergency Medicine

## 2014-12-29 DIAGNOSIS — Z720 Tobacco use: Secondary | ICD-10-CM | POA: Insufficient documentation

## 2014-12-29 DIAGNOSIS — M5442 Lumbago with sciatica, left side: Secondary | ICD-10-CM | POA: Insufficient documentation

## 2014-12-29 MED ORDER — METHOCARBAMOL 500 MG PO TABS: 500.0000 mg | ORAL_TABLET | Freq: Two times a day (BID) | ORAL | Status: DC

## 2014-12-29 MED ORDER — HYDROMORPHONE HCL 1 MG/ML IJ SOLN
1.0000 mg | Freq: Once | INTRAMUSCULAR | Status: AC
  Administered 2014-12-29: 1 mg via INTRAMUSCULAR
  Filled 2014-12-29: qty 1

## 2014-12-29 MED ORDER — KETOROLAC TROMETHAMINE 60 MG/2ML IM SOLN
60.0000 mg | Freq: Once | INTRAMUSCULAR | Status: AC
  Administered 2014-12-29: 60 mg via INTRAMUSCULAR
  Filled 2014-12-29: qty 2

## 2014-12-29 MED ORDER — MELOXICAM 7.5 MG PO TABS: 15.0000 mg | ORAL_TABLET | Freq: Every day | ORAL | Status: DC

## 2014-12-29 MED ORDER — HYDROCODONE-ACETAMINOPHEN 5-325 MG PO TABS: 1.0000 | ORAL_TABLET | Freq: Four times a day (QID) | ORAL | Status: DC | PRN

## 2014-12-29 NOTE — ED Provider Notes (Signed)
CSN: [id number]     Arrival date & time 12/29/14  2308 History   This chart was scribed for Antony Madura, PA-C working with Loren Racer, MD by Elveria Rising, ED Scribe. This patient was seen in room WA13/WA13 and the patient's care was started at 11:41 PM.   Chief Complaint  Patient presents with  . Back Pain   The history is provided by the patient. No language interpreter was used.   HPI Comments: Kerry Young is a 49 y.o. female who presents to the Emergency Department complaining of sharp left lower back with radiation into posterior aspect of left leg, onset yesterday with worsening tonight. 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 Patient reports treatment with Advil, without relief. Patient reports history of similar pain for which she was evaluated for here in 09/2014; diagnosis of sciatica and discharged with Robaxin and Percocet. Patient notes that Percocet makes her itchy and she's had better success with Hydrocodone for pain relief. Patient denies personal history of cancer or IV drug use.   History reviewed. No pertinent past medical history. History reviewed. No pertinent past surgical history. No family history on file. History  Substance Use Topics  . Smoking status: Current Every Day Smoker -- 0.50 packs/day    Types: Cigarettes  . Smokeless tobacco: Never Used  . Alcohol Use: No   OB History    No data available      Review of Systems  Constitutional: Negative for fever and chills.  Genitourinary: Negative for dysuria and hematuria.  Musculoskeletal: Positive for back pain.  Skin: Negative for rash.  All other systems reviewed and are negative.   Allergies  Review of patient's allergies indicates no known allergies.  Home Medications   Prior to Admission medications   Medication Sig Start Date End Date Taking? Authorizing Provider  acetaminophen (TYLENOL) 500 MG tablet Take 1,500 mg by mouth  every 6 (six) hours as needed for moderate pain or headache.     Historical Provider, MD  diphenhydrAMINE (BENADRYL) 25 MG tablet Take 50 mg by mouth every 6 (six) hours as needed for allergies.    Historical Provider, MD  gabapentin (NEURONTIN) 100 MG capsule Take 1 pill on day one Take a pill with breakfast and lunch on day two Take a pill with breakfast lunch and dinner on day three and then continue to take 3 times daily Patient not taking: Reported on 08/02/2014 05/13/14   Toni Amend Forcucci, PA-C  HYDROcodone-acetaminophen (NORCO/VICODIN) 5-325 MG per tablet Take 1-2 tablets by mouth every 6 (six) hours as needed. 12/29/14   Antony Madura, PA-C  ibuprofen (ADVIL,MOTRIN) 200 MG tablet Take 400-600 mg by mouth every 6 (six) hours as needed for moderate pain.    Historical Provider, MD  ibuprofen (ADVIL,MOTRIN) 800 MG tablet Take 1 tablet (800 mg total) by mouth 3 (three) times daily. Patient not taking: Reported on 10/15/2014 05/10/14   Santiago Glad, PA-C  meloxicam (MOBIC) 7.5 MG tablet Take 2 tablets (15 mg total) by mouth daily. 12/29/14   Antony Madura, PA-C  methocarbamol (ROBAXIN) 500 MG tablet Take 1 tablet (500 mg total) by mouth 2 (two) times daily. 12/29/14   Antony Madura, PA-C  naproxen sodium (ANAPROX) 550 MG tablet Take 550 mg by mouth 2 (two) times daily as needed for moderate pain.    Historical Provider, MD  OVER THE COUNTER MEDICATION Apply 1 application topically 2 (two) times daily as needed.  Muscle rub cream--pain    Historical Provider, MD  promethazine (PHENERGAN) 25 MG tablet Take 25 mg by mouth every 6 (six) hours as needed for nausea or vomiting.    Historical Provider, MD  trolamine salicylate (ASPERCREME) 10 % cream Apply 1 application topically 2 (two) times daily as needed for muscle pain.    Historical Provider, MD   Triage Vitals: BP 101/78 mmHg  Pulse 93  Temp(Src) 98.5 F (36.9 C) (Oral)  Resp 18  Ht 5\' 3"  (1.6 m)  Wt 178 lb (80.74 kg)  BMI 31.54 kg/m2  SpO2  98%  Physical Exam  Constitutional: She is oriented to person, place, and time. She appears well-developed and well-nourished. No distress.  Nontoxic/nonseptic appearing  HENT:  Head: Normocephalic and atraumatic.  Eyes: Conjunctivae and EOM are normal. No scleral icterus.  Neck: Normal range of motion.  Cardiovascular: Normal rate, regular rhythm and intact distal pulses.   DP and PT pulses 2+ bilaterally  Pulmonary/Chest: Effort normal. No respiratory distress.  Respirations even and unlabored  Musculoskeletal: Normal range of motion. She exhibits tenderness.  Tenderness to palpation to the left lumbar paraspinal muscles. No bony deformities, step-offs, or crepitus to the lumbar midline. No tenderness to the lumbar midline. Patient has a positive straight leg raise.  Neurological: She is alert and oriented to person, place, and time. She exhibits normal muscle tone. Coordination normal.  Sensation to light touch intact in the bilateral lower extremities. Patient ambulatory with steady gait.  Skin: Skin is warm and dry. No rash noted. She is not diaphoretic. No erythema. No pallor.  Psychiatric: She has a normal mood and affect. Her behavior is normal.  Nursing note and vitals reviewed.   ED Course  Procedures (including critical care time)  COORDINATION OF CARE: 11:47 PM- Discussed treatment plan with patient at bedside and patient agreed to plan.   Labs Review Labs Reviewed - No data to display  Imaging Review No results found.   EKG Interpretation None      MDM   Final diagnoses:  Left-sided low back pain with left-sided sciatica    Patient with back pain. She is neurovascularly intact. Patient can walk but states is painful. No loss of bowel or bladder control. No concern for cauda equina. No fever, h/o cancer, or h/o IVDU. Symptoms c/w likely disc herniation causing L sided sciatica. Pain tx in ED with Toradol and Dilaudid. We will manage symptoms as outpatient with  NSAIDs and Robaxin as well as a short course of Norco. Primary care follow up advised and return precautions given. Patient agreeable to plan with no unaddressed concerns.   Filed Vitals:   12/29/14 2317  BP: 101/78  Pulse: 93  Temp: 98.5 F (36.9 C)  TempSrc: Oral  Resp: 18  Height: 5\' 3"  (1.6 m)  Weight: 178 lb (80.74 kg)  SpO2: 98%      Antony Madura, PA-C 12/30/14 0011  Loren Racer, MD 12/30/14 [phone]

## 2014-12-29 NOTE — Discharge Instructions (Signed)
Take Mobic and Robaxin as prescribed. You may take Norco as needed for severe pain. Alternate ice and heat to your back 3-4 times per day for 15-20 minutes each time. Follow-up with your primary care doctor for recheck of symptoms, to ensure that symptoms resolved.  Back Pain, Adult Low back pain is very common. About 1 in 5 people have back pain.The cause of low back pain is rarely dangerous. The pain often gets better over time.About half of people with a sudden onset of back pain feel better in just 2 weeks. About 8 in 10 people feel better by 6 weeks.  CAUSES Some common causes of back pain include:  Strain of the muscles or ligaments supporting the spine.  Wear and tear (degeneration) of the spinal discs.  Arthritis.  Direct injury to the back. DIAGNOSIS Most of the time, the direct cause of low back pain is not known.However, back pain can be treated effectively even when the exact cause of the pain is unknown.Answering your caregiver's questions about your overall health and symptoms is one of the most accurate ways to make sure the cause of your pain is not dangerous. If your caregiver needs more information, he or she may order lab work or imaging tests (X-rays or MRIs).However, even if imaging tests show changes in your back, this usually does not require surgery. HOME CARE INSTRUCTIONS For many people, back pain returns.Since low back pain is rarely dangerous, it is often a condition that people can learn to [facility] their own.   Remain active. It is stressful on the back to sit or stand in one place. Do not sit, drive, or stand in one place for more than 30 minutes at a time. Take short walks on level surfaces as soon as pain allows.Try to increase the length of time you walk each day.  Do not stay in bed.Resting more than 1 or 2 days can delay your recovery.  Do not avoid exercise or work.Your body is made to move.It is not dangerous to be active, even though your back  may hurt.Your back will likely heal faster if you return to being active before your pain is gone.  Pay attention to your body when you bend and lift. Many people have less discomfortwhen lifting if they bend their knees, keep the load close to their bodies,and avoid twisting. Often, the most comfortable positions are those that put less stress on your recovering back.  Find a comfortable position to sleep. Use a firm mattress and lie on your side with your knees slightly bent. If you lie on your back, put a pillow under your knees.  Only take over-the-counter or prescription medicines as directed by your caregiver. Over-the-counter medicines to reduce pain and inflammation are often the most helpful.Your caregiver may prescribe muscle relaxant drugs.These medicines help dull your pain so you can more quickly return to your normal activities and healthy exercise.  Put ice on the injured area.  Put ice in a plastic bag.  Place a towel between your skin and the bag.  Leave the ice on for 15-20 minutes, 03-04 times a day for the first 2 to 3 days. After that, ice and heat may be alternated to reduce pain and spasms.  Ask your caregiver about trying back exercises and gentle massage. This may be of some benefit.  Avoid feeling anxious or stressed.Stress increases muscle tension and can worsen back pain.It is important to recognize when you are anxious or stressed and learn ways  to manage it.Exercise is a great option. SEEK MEDICAL CARE IF:  You have pain that is not relieved with rest or medicine.  You have pain that does not improve in 1 week.  You have new symptoms.  You are generally not feeling well. SEEK IMMEDIATE MEDICAL CARE IF:   You have pain that radiates from your back into your legs.  You develop new bowel or bladder control problems.  You have unusual weakness or numbness in your arms or legs.  You develop nausea or vomiting.  You develop abdominal pain.  You  feel faint. Document Released: 07/06/2005 Document Revised: 01/05/2012 Document Reviewed: 11/07/2013 [facility] Patient Information 2015 [place], [place]. This information is not intended to replace advice given to you by your health care provider. Make sure you discuss any questions you have with your health care provider.

## 2014-12-29 NOTE — ED Notes (Signed)
Pt c/o L lower back pain radiating to L leg. Onset yesterday, worse today

## 2014-12-30 MED ORDER — LIDOCAINE-EPINEPHRINE 2 %-1:100000 IJ SOLN
INTRAMUSCULAR | Status: AC
  Filled 2014-12-30: qty 1

## 2014-12-31 ENCOUNTER — Emergency Department (HOSPITAL_COMMUNITY)
Admission: EM | Admit: 2014-12-31 | Discharge: 2014-12-31 | Disposition: A | Discharge status: Home / Self Care | Payer: Self-pay | Attending: Emergency Medicine | Admitting: Emergency Medicine

## 2014-12-31 ENCOUNTER — Encounter (HOSPITAL_COMMUNITY): Payer: Self-pay | Admitting: Emergency Medicine

## 2014-12-31 DIAGNOSIS — Z720 Tobacco use: Secondary | ICD-10-CM | POA: Insufficient documentation

## 2014-12-31 DIAGNOSIS — M5442 Lumbago with sciatica, left side: Secondary | ICD-10-CM | POA: Insufficient documentation

## 2014-12-31 DIAGNOSIS — Z791 Long term (current) use of non-steroidal anti-inflammatories (NSAID): Secondary | ICD-10-CM | POA: Insufficient documentation

## 2014-12-31 DIAGNOSIS — Z79899 Other long term (current) drug therapy: Secondary | ICD-10-CM | POA: Insufficient documentation

## 2014-12-31 MED ORDER — PREDNISONE 20 MG PO TABS: ORAL_TABLET | ORAL | Status: DC

## 2014-12-31 MED ORDER — HYDROCODONE-ACETAMINOPHEN 5-325 MG PO TABS
2.0000 | ORAL_TABLET | Freq: Once | ORAL | Status: AC
  Administered 2014-12-31: 2 via ORAL
  Filled 2014-12-31: qty 2

## 2014-12-31 MED ORDER — DIAZEPAM 5 MG PO TABS: 5.0000 mg | ORAL_TABLET | Freq: Two times a day (BID) | ORAL | Status: DC

## 2014-12-31 NOTE — ED Notes (Signed)
Pt was here Sunday night for same sxs. Pt c/o lower back pain and states meds prescribed are not working. States pain radiates down left side into leg.

## 2014-12-31 NOTE — ED Provider Notes (Signed)
CSN: [id number]     Arrival date & time 12/31/14  1706 History  This chart was scribed for non-physician practitioner, Oswaldo Conroy, PA-C working with Lorre Nick, MD by Phillis Haggis, ED scribe. This patient was seen in room WTR8/WTR8 and the patient's care was started at 6:24 PM.  Chief Complaint  Patient presents with  . Back Pain    The history is provided by the patient. No language interpreter was used.    HPI Comments: Kerry Young is a 49 y.o. female who presents to the Emergency Department complaining of radiating, sharp left lower back pain onset 3 days ago. She states that the back pain radiates down her leg down to her left leg and foot. She reports numbness and tingling to the foot. She states that this is worse than her last visit to the ED on Saturday night. 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 She reports worsening pain with most movements. She states that the pain medication and muscle relaxants that she was prescribed on her last visit do not touch her pain. She denies bladder or bowel incontinence, numbness or tingling around the genital or anal region, fever, chills, abdominal pain, nausea, vomiting, or dysuria. She denies history of IV drug use, diabetes or chronic medical problems.    History reviewed. No pertinent past medical history. History reviewed. No pertinent past surgical history. No family history on file. History  Substance Use Topics  . Smoking status: Current Every Day Smoker -- 0.50 packs/day    Types: Cigarettes  . Smokeless tobacco: Never Used  . Alcohol Use: No   OB History    No data available     Review of Systems  Constitutional: Negative for fever and chills.  Gastrointestinal: Negative for nausea, vomiting, abdominal pain and diarrhea.  Genitourinary: Negative for dysuria and difficulty urinating.  Musculoskeletal: Positive for back pain.  Neurological: Positive for numbness. Negative for weakness.    Allergies  Review of patient's allergies indicates no known allergies.  Home Medications   Prior to Admission medications   Medication Sig Start Date End Date Taking? Authorizing Provider  acetaminophen (TYLENOL) 500 MG tablet Take 1,500 mg by mouth every 6 (six) hours as needed for moderate pain or headache.     Historical Provider, MD  diazepam (VALIUM) 5 MG tablet Take 1 tablet (5 mg total) by mouth 2 (two) times daily. 12/31/14   Oswaldo Conroy, PA-C  diphenhydrAMINE (BENADRYL) 25 MG tablet Take 50 mg by mouth every 6 (six) hours as needed for allergies.    Historical Provider, MD  gabapentin (NEURONTIN) 100 MG capsule Take 1 pill on day one Take a pill with breakfast and lunch on day two Take a pill with breakfast lunch and dinner on day three and then continue to take 3 times daily Patient not taking: Reported on 08/02/2014 05/13/14   Toni Amend Forcucci, PA-C  HYDROcodone-acetaminophen (NORCO/VICODIN) 5-325 MG per tablet Take 1-2 tablets by mouth every 6 (six) hours as needed. 12/29/14   Antony Madura, PA-C  ibuprofen (ADVIL,MOTRIN) 200 MG tablet Take 400-600 mg by mouth every 6 (six) hours as needed for moderate pain.    Historical Provider, MD  ibuprofen (ADVIL,MOTRIN) 800 MG tablet Take 1 tablet (800 mg total) by mouth 3 (three) times daily. Patient not taking: Reported on 10/15/2014 05/10/14   Santiago Glad, PA-C  meloxicam (MOBIC) 7.5 MG tablet Take 2 tablets (15 mg total) by mouth daily. 12/29/14   Antony Madura, PA-C  methocarbamol (ROBAXIN) 500 MG tablet Take 1 tablet (500 mg total) by mouth 2 (two) times daily. 12/29/14   Antony Madura, PA-C  naproxen sodium (ANAPROX) 550 MG tablet Take 550 mg by mouth 2 (two) times daily as needed for moderate pain.    Historical Provider, MD  OVER THE COUNTER MEDICATION Apply 1 application topically 2 (two) times daily as needed. Muscle rub cream--pain    Historical Provider, MD  predniSONE (DELTASONE) 20 MG tablet 2 tabs po daily x 5 days 12/31/14    Oswaldo Conroy, PA-C  promethazine (PHENERGAN) 25 MG tablet Take 25 mg by mouth every 6 (six) hours as needed for nausea or vomiting.    Historical Provider, MD  trolamine salicylate (ASPERCREME) 10 % cream Apply 1 application topically 2 (two) times daily as needed for muscle pain.    Historical Provider, MD   BP 105/69 mmHg  Pulse 98  Temp(Src) 97.8 F (36.6 C) (Oral)  Resp 16  SpO2 97%  Physical Exam  Constitutional: She appears well-developed and well-nourished. No distress.  HENT:  Head: Normocephalic and atraumatic.  Eyes: Conjunctivae are normal. Right eye exhibits no discharge. Left eye exhibits no discharge.  Cardiovascular: Normal rate, regular rhythm and normal heart sounds.   Pulmonary/Chest: Effort normal and breath sounds normal. No respiratory distress. She has no wheezes.  Abdominal: Soft. Bowel sounds are normal. She exhibits no distension. There is no tenderness.  Musculoskeletal:  No midline back tenderness, step off or crepitus. Left sided lower back tenderness. No CVA tenderness. Tight left paraspinal hypertrophy  Neurological: She is alert. Coordination normal.  Equal muscle tone. 5/5 strength in lower extremities. DTR equal and intact. Positive left straight leg test. Antalgic gait.  Skin: Skin is warm and dry. She is not diaphoretic.  Nursing note and vitals reviewed.   ED Course  Procedures  DIAGNOSTIC STUDIES: Oxygen Saturation is 97% on RA, normal by my interpretation.    COORDINATION OF CARE: 6:30 PM Discussed treatment plan which includes prednisone and valium with pt at bedside  and pt agreed to plan; told pt to follow up with orthopedics  Labs Review Labs Reviewed - No data to display  Imaging Review No results found.   EKG Interpretation None      Meds given in ED:  Medications  HYDROcodone-acetaminophen (NORCO/VICODIN) 5-325 MG per tablet 2 tablet (not administered)    New Prescriptions   DIAZEPAM (VALIUM) 5 MG TABLET    Take 1  tablet (5 mg total) by mouth 2 (two) times daily.   PREDNISONE (DELTASONE) 20 MG TABLET    2 tabs po daily x 5 days      MDM   Final diagnoses:  Left-sided low back pain with left-sided sciatica   Patient with back pain. No loss of bowel or bladder control. No saddle anesthesia. No fever, night sweats, weight loss, h/o cancer, IVDU. VSS. No neurological deficits except for positive left straight leg raise. Pt with element of radiculopathy/sciatica. Pt ambulatory. No concern for cauda equina.  With give trial of prednisone and valium. Driving and sedation precautiosn provided. Pt instructed to not use valium within 4 hours of narcotics. RICE protocol and pain medicine indicated and discussed with patient. Driving and sedation precautions provided. Patient is afebrile, nontoxic, and in no acute distress. Patient is appropriate for outpatient management and is stable for discharge.  Discussed return precautions with patient. Discussed all results and patient verbalizes understanding and agrees with plan.  I personally performed the services described in  this documentation, which was scribed in my presence. The recorded information has been reviewed and is accurate.    Oswaldo Conroy, PA-C 12/31/14 1853  Lorre Nick, MD 12/31/14 [phone]

## 2014-12-31 NOTE — Discharge Instructions (Signed)
Return to the emergency room with worsening of symptoms, new symptoms or with symptoms that are concerning , especially fevers, loss of control of bladder or bowels, numbness or tingling around genital region or anus, weakness. RICE: Rest, Ice (three cycles of 20 mins on, off at least twice a day), compression/brace, elevation. Heating pad works well for back pain. Ibuprofen 400mg  (2 tablets 200mg ) every 5-6 hours for 3-5 days. Valium for muscle relaxation. Do not take within 4 hours of vicodin/hydrocodone. Do not operate machinery, drive or drink alcohol while taking narcotics or muscle relaxers. Prednisone in the morning. Call to make follow up appointment with orthopedics. Read below information and follow recommendations.  Back Exercises Back exercises help treat and prevent back injuries. The goal of back exercises is to increase the strength of your abdominal and back muscles and the flexibility of your back. These exercises should be started when you no longer have back pain. Back exercises include:  Pelvic Tilt. Lie on your back with your knees bent. Tilt your pelvis until the lower part of your back is against the floor. Hold this position 5 to 10 sec and repeat 5 to 10 times.  Knee to Chest. Pull first 1 knee up against your chest and hold for 20 to 30 seconds, repeat this with the other knee, and then both knees. This may be done with the other leg straight or bent, whichever feels better.  Sit-Ups or Curl-Ups. Bend your knees 90 degrees. Start with tilting your pelvis, and do a partial, slow sit-up, lifting your trunk only 30 to 45 degrees off the floor. Take at least 2 to 3 seconds for each sit-up. Do not do sit-ups with your knees out straight. If partial sit-ups are difficult, simply do the above but with only tightening your abdominal muscles and holding it as directed.  Hip-Lift. Lie on your back with your knees flexed 90 degrees. Push down with your feet and shoulders as you  raise your hips a couple inches off the floor; hold for 10 seconds, repeat 5 to 10 times.  Back arches. Lie on your stomach, propping yourself up on bent elbows. Slowly press on your hands, causing an arch in your low back. Repeat 3 to 5 times. Any initial stiffness and discomfort should lessen with repetition over time.  Shoulder-Lifts. Lie face down with arms beside your body. Keep hips and torso pressed to floor as you slowly lift your head and shoulders off the floor. Do not overdo your exercises, especially in the beginning. Exercises may cause you some mild back discomfort which lasts for a few minutes; however, if the pain is more severe, or lasts for more than 15 minutes, do not continue exercises until you see your caregiver. Improvement with exercise therapy for back problems is slow.  See your caregivers for assistance with developing a proper back exercise program. Document Released: 08/13/2004 Document Revised: 09/28/2011 Document Reviewed: 05/07/2011 [facility] Patient Information 2015 [place], [place]. This information is not intended to replace advice given to you by your health care provider. Make sure you discuss any questions you have with your health care provider.

## 2015-01-11 ENCOUNTER — Emergency Department (HOSPITAL_COMMUNITY)
Admission: EM | Admit: 2015-01-11 | Discharge: 2015-01-11 | Disposition: A | Discharge status: Home / Self Care | Payer: Self-pay | Attending: Emergency Medicine | Admitting: Emergency Medicine

## 2015-01-11 ENCOUNTER — Encounter (HOSPITAL_COMMUNITY): Payer: Self-pay | Admitting: Emergency Medicine

## 2015-01-11 DIAGNOSIS — Z720 Tobacco use: Secondary | ICD-10-CM | POA: Insufficient documentation

## 2015-01-11 DIAGNOSIS — Z3202 Encounter for pregnancy test, result negative: Secondary | ICD-10-CM | POA: Insufficient documentation

## 2015-01-11 DIAGNOSIS — R197 Diarrhea, unspecified: Secondary | ICD-10-CM

## 2015-01-11 DIAGNOSIS — K645 Perianal venous thrombosis: Secondary | ICD-10-CM

## 2015-01-11 DIAGNOSIS — Z79899 Other long term (current) drug therapy: Secondary | ICD-10-CM | POA: Insufficient documentation

## 2015-01-11 DIAGNOSIS — Z7952 Long term (current) use of systemic steroids: Secondary | ICD-10-CM | POA: Insufficient documentation

## 2015-01-11 DIAGNOSIS — K644 Residual hemorrhoidal skin tags: Secondary | ICD-10-CM | POA: Insufficient documentation

## 2015-01-11 DIAGNOSIS — Z791 Long term (current) use of non-steroidal anti-inflammatories (NSAID): Secondary | ICD-10-CM | POA: Insufficient documentation

## 2015-01-11 LAB — COMPREHENSIVE METABOLIC PANEL
ALBUMIN: 3.7 g/dL (ref 3.5–5.0)
ALT: 14 U/L (ref 14–54)
ANION GAP: 8 (ref 5–15)
AST: 16 U/L (ref 15–41)
Alkaline Phosphatase: 46 U/L (ref 38–126)
BUN: 10 mg/dL (ref 6–20)
CALCIUM: 9.3 mg/dL (ref 8.9–10.3)
CHLORIDE: 108 mmol/L (ref 101–111)
CO2: 22 mmol/L (ref 22–32)
CREATININE: 0.7 mg/dL (ref 0.44–1.00)
GFR calc Af Amer: >60 mL/min (ref >60)
Glucose, Bld: 92 mg/dL (ref 65–99)
Potassium: 3.9 mmol/L (ref 3.5–5.1)
Sodium: 138 mmol/L (ref 135–145)
Total Bilirubin: 1 mg/dL (ref 0.3–1.2)
Total Protein: 7 g/dL (ref 6.5–8.1)

## 2015-01-11 LAB — LIPASE, BLOOD: Lipase: 24 U/L (ref 22–51)

## 2015-01-11 LAB — CBC WITH DIFFERENTIAL/PLATELET
BASOS PCT: 1 % (ref 0–1)
Basophils Absolute: 0 10*3/uL (ref 0.0–0.1)
EOS ABS: 0.2 10*3/uL (ref 0.0–0.7)
EOS PCT: 4 % (ref 0–5)
HCT: 43.4 % (ref 36.0–46.0)
Hemoglobin: 14.7 g/dL (ref 12.0–15.0)
LYMPHS ABS: 2.1 10*3/uL (ref 0.7–4.0)
Lymphocytes Relative: 32 % (ref 12–46)
MCH: 29 pg (ref 26.0–34.0)
MCHC: 33.9 g/dL (ref 30.0–36.0)
MCV: 85.6 fL (ref 78.0–100.0)
MONO ABS: 0.5 10*3/uL (ref 0.1–1.0)
Monocytes Relative: 8 % (ref 3–12)
NEUTROS PCT: 55 % (ref 43–77)
Neutro Abs: 3.6 10*3/uL (ref 1.7–7.7)
Platelets: 304 10*3/uL (ref 150–400)
RBC: 5.07 MIL/uL (ref 3.87–5.11)
RDW: 13.3 % (ref 11.5–15.5)
WBC: 6.4 10*3/uL (ref 4.0–10.5)

## 2015-01-11 LAB — URINALYSIS, ROUTINE W REFLEX MICROSCOPIC
Bilirubin Urine: NEGATIVE
GLUCOSE, UA: NEGATIVE mg/dL
Hgb urine dipstick: NEGATIVE
KETONES UR: NEGATIVE mg/dL
Leukocytes, UA: NEGATIVE
NITRITE: NEGATIVE
PH: 7.5 (ref 5.0–8.0)
Protein, ur: NEGATIVE mg/dL
Specific Gravity, Urine: 1.026 (ref 1.005–1.030)
Urobilinogen, UA: 0.2 mg/dL (ref 0.0–1.0)

## 2015-01-11 LAB — POC OCCULT BLOOD, ED: Fecal Occult Bld: NEGATIVE

## 2015-01-11 LAB — POC URINE PREG, ED: Preg Test, Ur: NEGATIVE

## 2015-01-11 MED ORDER — HYDROCORTISONE ACETATE 25 MG RE SUPP
25.0000 mg | Freq: Once | RECTAL | Status: DC
  Filled 2015-01-11: qty 1

## 2015-01-11 MED ORDER — SODIUM CHLORIDE 0.9 % IV BOLUS (SEPSIS)
1000.0000 mL | Freq: Once | INTRAVENOUS | Status: AC
  Administered 2015-01-11: 1000 mL via INTRAVENOUS

## 2015-01-11 MED ORDER — PSYLLIUM 28 % PO PACK
1.0000 | PACK | Freq: Two times a day (BID) | ORAL | Status: DC
Start: 2015-01-11 — End: 2015-08-13

## 2015-01-11 MED ORDER — HYDROCODONE-ACETAMINOPHEN 5-325 MG PO TABS: 1.0000 | ORAL_TABLET | Freq: Four times a day (QID) | ORAL | Status: DC | PRN

## 2015-01-11 MED ORDER — MORPHINE SULFATE 4 MG/ML IJ SOLN
4.0000 mg | Freq: Once | INTRAMUSCULAR | Status: AC
  Administered 2015-01-11: 4 mg via INTRAVENOUS
  Filled 2015-01-11: qty 1

## 2015-01-11 MED ORDER — HYDROCORTISONE 2.5 % RE CREA: TOPICAL_CREAM | RECTAL | Status: DC

## 2015-01-11 NOTE — ED Provider Notes (Signed)
CSN: [id number]     Arrival date & time 01/11/15  4098 History   First MD Initiated Contact with Patient 01/11/15 [phone]     No chief complaint on file.    (Consider location/radiation/quality/duration/timing/severity/associated sxs/prior Treatment) HPI   49 year old female presents for evaluation of rectal pain and persistent diarrhea. Patient states for the past week she has had persistent nonbloody but mucousy diarrhea more than 10 bouts a day. Symptom has been persistent. Since yesterday she notice increased rectal pain, with trace of blood when he wipes. She also notice a rectal hemorrhoid which she has not had since her last pregnancy. Increasing pain with wiping. She has tried using Preparation H with minimal improvement. Patient reports she feels tired, and dehydrated. Endorse abdominal cramping and low back pain from her persistent diarrhea. She denies having fever, chills, lightheadedness, dizziness, chest pain, shortness of breath, dysuria, hematuria, vaginal bleeding or vaginal discharge. No recent antibiotic use. She did report her boss travel to [place] 2 weeks ago and returns a week ago with diarrhea. She did report having contact with her boss. She denies any recent travel or eating exotic food.   No past medical history on file. No past surgical history on file. No family history on file. History  Substance Use Topics  . Smoking status: Current Every Day Smoker -- 0.50 packs/day    Types: Cigarettes  . Smokeless tobacco: Never Used  . Alcohol Use: No   OB History    No data available     Review of Systems  All other systems reviewed and are negative.     Allergies  Review of patient's allergies indicates no known allergies.  Home Medications   Prior to Admission medications   Medication Sig Start Date End Date Taking? Authorizing Provider  acetaminophen (TYLENOL) 500 MG tablet Take 1,500 mg by mouth every 6 (six) hours as needed for moderate pain or headache.      Historical Provider, MD  diazepam (VALIUM) 5 MG tablet Take 1 tablet (5 mg total) by mouth 2 (two) times daily. 12/31/14   Oswaldo Conroy, PA-C  diphenhydrAMINE (BENADRYL) 25 MG tablet Take 50 mg by mouth every 6 (six) hours as needed for allergies.    Historical Provider, MD  gabapentin (NEURONTIN) 100 MG capsule Take 1 pill on day one Take a pill with breakfast and lunch on day two Take a pill with breakfast lunch and dinner on day three and then continue to take 3 times daily Patient not taking: Reported on 08/02/2014 05/13/14   Toni Amend Forcucci, PA-C  HYDROcodone-acetaminophen (NORCO/VICODIN) 5-325 MG per tablet Take 1-2 tablets by mouth every 6 (six) hours as needed. 12/29/14   Antony Madura, PA-C  ibuprofen (ADVIL,MOTRIN) 200 MG tablet Take 400-600 mg by mouth every 6 (six) hours as needed for moderate pain.    Historical Provider, MD  ibuprofen (ADVIL,MOTRIN) 800 MG tablet Take 1 tablet (800 mg total) by mouth 3 (three) times daily. Patient not taking: Reported on 10/15/2014 05/10/14   Santiago Glad, PA-C  meloxicam (MOBIC) 7.5 MG tablet Take 2 tablets (15 mg total) by mouth daily. 12/29/14   Antony Madura, PA-C  methocarbamol (ROBAXIN) 500 MG tablet Take 1 tablet (500 mg total) by mouth 2 (two) times daily. 12/29/14   Antony Madura, PA-C  naproxen sodium (ANAPROX) 550 MG tablet Take 550 mg by mouth 2 (two) times daily as needed for moderate pain.    Historical Provider, MD  OVER THE COUNTER MEDICATION Apply 1 application topically 2 (  two) times daily as needed. Muscle rub cream--pain    Historical Provider, MD  predniSONE (DELTASONE) 20 MG tablet 2 tabs po daily x 5 days 12/31/14   Oswaldo Conroy, PA-C  promethazine (PHENERGAN) 25 MG tablet Take 25 mg by mouth every 6 (six) hours as needed for nausea or vomiting.    Historical Provider, MD  trolamine salicylate (ASPERCREME) 10 % cream Apply 1 application topically 2 (two) times daily as needed for muscle pain.    Historical Provider, MD   There  were no vitals taken for this visit. Physical Exam  Constitutional: She appears well-developed and well-nourished. No distress.  Moderately obese Caucasian female, nontoxic in appearance  HENT:  Head: Atraumatic.  Eyes: Conjunctivae are normal.  Neck: Neck supple.  Cardiovascular: Normal rate and regular rhythm.   Pulmonary/Chest: Effort normal and breath sounds normal.  Abdominal: Soft. Bowel sounds are normal. She exhibits no distension. There is tenderness (Diffuse lower abdominal tenderness on palpation without peritoneal sign.).  Genitourinary:  Chaperone present during exam: Rectum with evidence of thrombosed external hemorrhoid that is tender to palpation. No obvious mass. Rectal tone is intact. Normal stool color. Difficult to examine for internal hemorrhoid due to patient's discomfort. No anal fissure.  Neurological: She is alert.  Skin: No rash noted.  Psychiatric: She has a normal mood and affect.  Nursing note and vitals reviewed.   ED Course  Procedures (including critical care time)  Patient presents with nausea vomiting diarrhea suspicious for viral GI. Complain of rectal pain. She does have evidence of external hemorrhoid and a thrombosed hemorrhoid. Fecal occult blood is negative. Anusol suppository given in the ER. IV fluid and antinausea medication provided. Will check basic labs.  11:26 AM No evidence of anemia, normal electrolyte panel, fecal occult blood is negative, pregnancy test is negative. Patient received IV fluid in the ED. I suspect this is viral in etiology. Patient will be provided with a short course of pain medication, Anusol cream, fibers supplementation and permission to be out of work for the next several days as patient does a lot of heavy lifting and she felt her symptoms not well tolerated at work. She works in [employer] does increase the risk of spreading infection if the symptoms not fully resolved. Return precaution discussed.  Labs  Review Labs Reviewed  CBC WITH DIFFERENTIAL/PLATELET  COMPREHENSIVE METABOLIC PANEL  LIPASE, BLOOD  URINALYSIS, ROUTINE W REFLEX MICROSCOPIC (NOT AT [facility])  POC URINE PREG, ED  POC OCCULT BLOOD, ED    Imaging Review No results found.   EKG Interpretation None      MDM   Final diagnoses:  Diarrhea  Thrombosed external hemorrhoid    BP 95/58 mmHg  Pulse 60  Temp(Src) 98.4 F (36.9 C) (Oral)  Resp 18  Ht 5\' 2"  (1.575 m)  Wt 170 lb (77.111 kg)  BMI 31.09 kg/m2  SpO2 99% BP is a bit soft, IVF given, pt tolerates PO, encourage hydration.    Fayrene Helper, PA-C 01/11/15 1133  Raeford Razor, MD 01/12/15 [phone]

## 2015-01-11 NOTE — ED Notes (Signed)
Patient coming from home with c/o of pain from hemorrohoids and diarrhea ongoing since Monday.  Pain 10/10.

## 2015-01-11 NOTE — Discharge Instructions (Signed)
Hemorrhoids °Hemorrhoids are swollen veins around the rectum or anus. There are two types of hemorrhoids:  °· Internal hemorrhoids. These occur in the veins just inside the rectum. They may poke through to the outside and become irritated and painful. °· External hemorrhoids. These occur in the veins outside the anus and can be felt as a painful swelling or hard lump near the anus. °CAUSES °· Pregnancy.   °· Obesity.   °· Constipation or diarrhea.   °· Straining to have a bowel movement.   °· Sitting for long periods on the toilet. °· Heavy lifting or other activity that caused you to strain. °· Anal intercourse. °SYMPTOMS  °· Pain.   °· Anal itching or irritation.   °· Rectal bleeding.   °· Fecal leakage.   °· Anal swelling.   °· One or more lumps around the anus.   °DIAGNOSIS  °Your caregiver may be able to diagnose hemorrhoids by visual examination. Other examinations or tests that may be performed include:  °· Examination of the rectal area with a gloved hand (digital rectal exam).   °· Examination of anal canal using a small tube (scope).   °· A blood test if you have lost a significant amount of blood. °· A test to look inside the colon (sigmoidoscopy or colonoscopy). °TREATMENT °Most hemorrhoids can be treated at home. However, if symptoms do not seem to be getting better or if you have a lot of rectal bleeding, your caregiver may perform a procedure to help make the hemorrhoids get smaller or remove them completely. Possible treatments include:  °· Placing a rubber band at the base of the hemorrhoid to cut off the circulation (rubber band ligation).   °· Injecting a chemical to shrink the hemorrhoid (sclerotherapy).   °· Using a tool to burn the hemorrhoid (infrared light therapy).   °· Surgically removing the hemorrhoid (hemorrhoidectomy).   °· Stapling the hemorrhoid to block blood flow to the tissue (hemorrhoid stapling).   °HOME CARE INSTRUCTIONS  °· Eat foods with fiber, such as whole grains, beans,  nuts, fruits, and vegetables. Ask your doctor about taking products with added fiber in them (fiber supplements). °· Increase fluid intake. Drink enough water and fluids to keep your urine clear or pale yellow.   °· Exercise regularly.   °· Go to the bathroom when you have the urge to have a bowel movement. Do not wait.   °· Avoid straining to have bowel movements.   °· Keep the anal area dry and clean. Use wet toilet paper or moist towelettes after a bowel movement.   °· Medicated creams and suppositories may be used or applied as directed.   °· Only take over-the-counter or prescription medicines as directed by your caregiver.   °· Take warm sitz baths for 15-20 minutes, 3-4 times a day to ease pain and discomfort.   °· Place ice packs on the hemorrhoids if they are tender and swollen. Using ice packs between sitz baths may be helpful.   °¨ Put ice in a plastic bag.   °¨ Place a towel between your skin and the bag.   °¨ Leave the ice on for 15-20 minutes, 3-4 times a day.   °· Do not use a donut-shaped pillow or sit on the toilet for long periods. This increases blood pooling and pain.   °SEEK MEDICAL CARE IF: °· You have increasing pain and swelling that is not controlled by treatment or medicine. °· You have uncontrolled bleeding. °· You have difficulty or you are unable to have a bowel movement. °· You have pain or inflammation outside the area of the hemorrhoids. °MAKE SURE YOU: °· Understand these instructions. °·   Will watch your condition.  Will get help right away if you are not doing well or get worse. Document Released: 07/03/2000 Document Revised: 06/22/2012 Document Reviewed: 05/10/2012 [facility] Patient Information 2015 [place], [place]. This information is not intended to replace advice given to you by your health care provider. Make sure you discuss any questions you have with your health care provider.  Viral Gastroenteritis Viral gastroenteritis is also known as stomach flu. This condition  affects the stomach and intestinal tract. It can cause sudden diarrhea and vomiting. The illness typically lasts 3 to 8 days. Most people develop an immune response that eventually gets rid of the virus. While this natural response develops, the virus can make you quite ill. CAUSES  Many different viruses can cause gastroenteritis, such as rotavirus or noroviruses. You can catch one of these viruses by consuming contaminated food or water. You may also catch a virus by sharing utensils or other personal items with an infected person or by touching a contaminated surface. SYMPTOMS  The most common symptoms are diarrhea and vomiting. These problems can cause a severe loss of body fluids (dehydration) and a body salt (electrolyte) imbalance. Other symptoms may include:  Fever.  Headache.  Fatigue.  Abdominal pain. DIAGNOSIS  Your caregiver can usually diagnose viral gastroenteritis based on your symptoms and a physical exam. A stool sample may also be taken to test for the presence of viruses or other infections. TREATMENT  This illness typically goes away on its own. Treatments are aimed at rehydration. The most serious cases of viral gastroenteritis involve vomiting so severely that you are not able to keep fluids down. In these cases, fluids must be given through an intravenous line (IV). HOME CARE INSTRUCTIONS   Drink enough fluids to keep your urine clear or pale yellow. Drink small amounts of fluids frequently and increase the amounts as tolerated.  Ask your caregiver for specific rehydration instructions.  Avoid:  Foods high in sugar.  Alcohol.  Carbonated drinks.  Tobacco.  Juice.  Caffeine drinks.  Extremely hot or cold fluids.  Fatty, greasy foods.  Too much intake of anything at one time.  Dairy products until 24 to 48 hours after diarrhea stops.  You may consume probiotics. Probiotics are active cultures of beneficial bacteria. They may lessen the amount and  number of diarrheal stools in adults. Probiotics can be found in yogurt with active cultures and in supplements.  Wash your hands well to avoid spreading the virus.  Only take over-the-counter or prescription medicines for pain, discomfort, or fever as directed by your caregiver. Do not give aspirin to children. Antidiarrheal medicines are not recommended.  Ask your caregiver if you should continue to take your regular prescribed and over-the-counter medicines.  Keep all follow-up appointments as directed by your caregiver. SEEK IMMEDIATE MEDICAL CARE IF:   You are unable to keep fluids down.  You do not urinate at least once every 6 to 8 hours.  You develop shortness of breath.  You notice blood in your stool or vomit. This may look like coffee grounds.  You have abdominal pain that increases or is concentrated in one small area (localized).  You have persistent vomiting or diarrhea.  You have a fever.  The patient is a child younger than 3 months, and he or she has a fever.  The patient is a child older than 3 months, and he or she has a fever and persistent symptoms.  The patient is a child older than 3  months, and he or she has a fever and symptoms suddenly get worse.  The patient is a baby, and he or she has no tears when crying. MAKE SURE YOU:   Understand these instructions.  Will watch your condition.  Will get help right away if you are not doing well or get worse. Document Released: 07/06/2005 Document Revised: 09/28/2011 Document Reviewed: 04/22/2011 [facility] Patient Information 2015 [place], [place]. This information is not intended to replace advice given to you by your health care provider. Make sure you discuss any questions you have with your health care provider.

## 2015-01-11 NOTE — ED Notes (Signed)
PA Laveda Norman accessed patient for rectal hemorroids with this RN at bedside.

## 2015-05-03 ENCOUNTER — Emergency Department (HOSPITAL_COMMUNITY)
Admission: EM | Admit: 2015-05-03 | Discharge: 2015-05-04 | Disposition: A | Discharge status: Home / Self Care | Payer: Self-pay | Attending: Emergency Medicine | Admitting: Emergency Medicine

## 2015-05-03 ENCOUNTER — Encounter (HOSPITAL_COMMUNITY): Payer: Self-pay | Admitting: Emergency Medicine

## 2015-05-03 DIAGNOSIS — N12 Tubulo-interstitial nephritis, not specified as acute or chronic: Secondary | ICD-10-CM | POA: Insufficient documentation

## 2015-05-03 DIAGNOSIS — Z3202 Encounter for pregnancy test, result negative: Secondary | ICD-10-CM | POA: Insufficient documentation

## 2015-05-03 DIAGNOSIS — Z79899 Other long term (current) drug therapy: Secondary | ICD-10-CM | POA: Insufficient documentation

## 2015-05-03 DIAGNOSIS — R197 Diarrhea, unspecified: Secondary | ICD-10-CM | POA: Insufficient documentation

## 2015-05-03 DIAGNOSIS — Z720 Tobacco use: Secondary | ICD-10-CM | POA: Insufficient documentation

## 2015-05-03 DIAGNOSIS — Z87442 Personal history of urinary calculi: Secondary | ICD-10-CM | POA: Insufficient documentation

## 2015-05-03 LAB — COMPREHENSIVE METABOLIC PANEL
ALBUMIN: 3.8 g/dL (ref 3.5–5.0)
ALK PHOS: 53 U/L (ref 38–126)
ALT: 15 U/L (ref 14–54)
AST: 20 U/L (ref 15–41)
Anion gap: 6 (ref 5–15)
BUN: 10 mg/dL (ref 6–20)
CALCIUM: 9.2 mg/dL (ref 8.9–10.3)
CHLORIDE: 108 mmol/L (ref 101–111)
CO2: 24 mmol/L (ref 22–32)
CREATININE: 0.8 mg/dL (ref 0.44–1.00)
GFR calc non Af Amer: >60 mL/min (ref >60)
GLUCOSE: 91 mg/dL (ref 65–99)
Potassium: 3.8 mmol/L (ref 3.5–5.1)
SODIUM: 138 mmol/L (ref 135–145)
Total Bilirubin: 1.2 mg/dL (ref 0.3–1.2)
Total Protein: 7.3 g/dL (ref 6.5–8.1)

## 2015-05-03 LAB — POC URINE PREG, ED: PREG TEST UR: NEGATIVE

## 2015-05-03 LAB — CBC WITH DIFFERENTIAL/PLATELET
BASOS ABS: 0.1 10*3/uL (ref 0.0–0.1)
Basophils Relative: 1 %
Eosinophils Absolute: 0.2 10*3/uL (ref 0.0–0.7)
Eosinophils Relative: 3 %
HCT: 42.6 % (ref 36.0–46.0)
HEMOGLOBIN: 14.7 g/dL (ref 12.0–15.0)
LYMPHS ABS: 2.6 10*3/uL (ref 0.7–4.0)
LYMPHS PCT: 33 %
MCH: 29.9 pg (ref 26.0–34.0)
MCHC: 34.5 g/dL (ref 30.0–36.0)
MCV: 86.6 fL (ref 78.0–100.0)
MONO ABS: 0.8 10*3/uL (ref 0.1–1.0)
MONOS PCT: 10 %
NEUTROS ABS: 4.2 10*3/uL (ref 1.7–7.7)
Neutrophils Relative %: 53 %
PLATELETS: 270 10*3/uL (ref 150–400)
RBC: 4.92 MIL/uL (ref 3.87–5.11)
RDW: 12.7 % (ref 11.5–15.5)
WBC: 7.7 10*3/uL (ref 4.0–10.5)

## 2015-05-03 LAB — URINALYSIS, ROUTINE W REFLEX MICROSCOPIC
BILIRUBIN URINE: NEGATIVE
GLUCOSE, UA: NEGATIVE mg/dL
Hgb urine dipstick: NEGATIVE
KETONES UR: NEGATIVE mg/dL
Nitrite: NEGATIVE
PROTEIN: NEGATIVE mg/dL
Specific Gravity, Urine: 1.023 (ref 1.005–1.030)
UROBILINOGEN UA: 1 mg/dL (ref 0.0–1.0)
pH: 7 (ref 5.0–8.0)

## 2015-05-03 LAB — URINE MICROSCOPIC-ADD ON

## 2015-05-03 LAB — LIPASE, BLOOD: Lipase: 23 U/L (ref 22–51)

## 2015-05-03 MED ORDER — DEXTROSE 5 % IV SOLN
1.0000 g | Freq: Once | INTRAVENOUS | Status: AC
  Administered 2015-05-03: 1 g via INTRAVENOUS
  Filled 2015-05-03: qty 10

## 2015-05-03 MED ORDER — HYDROCODONE-ACETAMINOPHEN 5-325 MG PO TABS: 1.0000 | ORAL_TABLET | Freq: Four times a day (QID) | ORAL | Status: DC | PRN

## 2015-05-03 MED ORDER — ONDANSETRON HCL 4 MG/2ML IJ SOLN
4.0000 mg | Freq: Once | INTRAMUSCULAR | Status: AC
  Administered 2015-05-03: 4 mg via INTRAVENOUS
  Filled 2015-05-03: qty 2

## 2015-05-03 MED ORDER — MORPHINE SULFATE (PF) 4 MG/ML IV SOLN
4.0000 mg | Freq: Once | INTRAVENOUS | Status: AC
  Administered 2015-05-03: 4 mg via INTRAVENOUS
  Filled 2015-05-03: qty 1

## 2015-05-03 MED ORDER — CIPROFLOXACIN HCL 500 MG PO TABS: 500.0000 mg | ORAL_TABLET | Freq: Two times a day (BID) | ORAL | Status: DC

## 2015-05-03 MED ORDER — SODIUM CHLORIDE 0.9 % IV BOLUS (SEPSIS)
1000.0000 mL | Freq: Once | INTRAVENOUS | Status: AC
  Administered 2015-05-03: 1000 mL via INTRAVENOUS

## 2015-05-03 NOTE — ED Provider Notes (Signed)
CSN: [id number]     Arrival date & time 05/03/15  2133 History   First MD Initiated Contact with Patient 05/03/15 2151     Chief Complaint  Patient presents with  . Dysuria    with strong odor  . Flank Pain    left  . Diarrhea    6-7 episodes     (Consider location/radiation/quality/duration/timing/severity/associated sxs/prior Treatment) HPI   Patient report for the past 2 days she has had some dysuria in which he described as urinary frequency with urgency and burning urination. Endorse having left flank pain, feeling nauseous, and has recurrent nonbloody non-mucousy diarrhea. States she has had 5-7 bouts of diarrhea today. She denies any associated vomiting. She endorse chills without fever. Denies having any chest pain or shortness of breath. Denies having any vaginal bleeding, vaginal discharge or rash. She is currently not sexually active and currently going through her menopause. Report remote history of kidney stones in her 820s. She denies any specific treatment tried. She denies any recent sick contact. She denies any recent antibiotic use.  History reviewed. No pertinent past medical history. History reviewed. No pertinent past surgical history. History reviewed. No pertinent family history. Social History  Substance Use Topics  . Smoking status: Current Every Day Smoker -- 0.50 packs/day    Types: Cigarettes  . Smokeless tobacco: Never Used  . Alcohol Use: No     Comment: occ   OB History    No data available     Review of Systems  All other systems reviewed and are negative.     Allergies  Percocet  Home Medications   Prior to Admission medications   Medication Sig Start Date End Date Taking? Authorizing Provider  acetaminophen (TYLENOL) 500 MG tablet Take 1,500 mg by mouth every 6 (six) hours as needed for moderate pain or headache.     Historical Provider, MD  clonazePAM (KLONOPIN) 0.5 MG tablet Take 0.5 mg by mouth 2 (two) times daily as needed for  anxiety.    Historical Provider, MD  diazepam (VALIUM) 5 MG tablet Take 1 tablet (5 mg total) by mouth 2 (two) times daily. Patient not taking: Reported on 01/11/2015 12/31/14   Oswaldo ConroyVictoria Creech, PA-C  diphenhydrAMINE (BENADRYL) 25 MG tablet Take 50 mg by mouth every 6 (six) hours as needed for allergies.    Historical Provider, MD  gabapentin (NEURONTIN) 100 MG capsule Take 1 pill on day one Take a pill with breakfast and lunch on day two Take a pill with breakfast lunch and dinner on day three and then continue to take 3 times daily Patient not taking: Reported on 08/02/2014 05/13/14   Toni Amendourtney Forcucci, PA-C  HYDROcodone-acetaminophen (NORCO/VICODIN) 5-325 MG per tablet Take 1 tablet by mouth every 6 (six) hours as needed for moderate pain. 01/11/15   Fayrene HelperBowie Kellsie Grindle, PA-C  hydrocortisone (ANUSOL-HC) 2.5 % rectal cream Apply rectally 2 times daily for 7 days max. 01/11/15   Fayrene HelperBowie Darris Staiger, PA-C  ibuprofen (ADVIL,MOTRIN) 200 MG tablet Take 600 mg by mouth every 8 (eight) hours as needed for headache.    Historical Provider, MD  ibuprofen (ADVIL,MOTRIN) 800 MG tablet Take 1 tablet (800 mg total) by mouth 3 (three) times daily. Patient not taking: Reported on 10/15/2014 05/10/14   Santiago GladHeather Laisure, PA-C  meloxicam (MOBIC) 7.5 MG tablet Take 2 tablets (15 mg total) by mouth daily. Patient not taking: Reported on 01/11/2015 12/29/14   Antony MaduraKelly Humes, PA-C  methocarbamol (ROBAXIN) 500 MG tablet Take 1 tablet (500 mg  total) by mouth 2 (two) times daily. Patient not taking: Reported on 01/11/2015 12/29/14   Antony Madura, PA-C  predniSONE (DELTASONE) 20 MG tablet 2 tabs po daily x 5 days Patient not taking: Reported on 01/11/2015 12/31/14   Oswaldo Conroy, PA-C  psyllium (METAMUCIL SMOOTH TEXTURE) 28 % packet Take 1 packet by mouth 2 (two) times daily. 01/11/15   Fayrene Helper, PA-C   BP 106/71 mmHg  Pulse 94  Temp(Src) 98.4 F (36.9 C) (Oral)  Resp 18  Ht  (1.575 m)  Wt 165 lb (74.844 kg)  BMI 30.17 kg/m2  SpO2  100% Physical Exam  Constitutional: She appears well-developed and well-nourished. No distress.  HENT:  Head: Atraumatic.  Eyes: Conjunctivae are normal.  Neck: Neck supple.  Cardiovascular: Normal rate and regular rhythm.   Pulmonary/Chest: Effort normal and breath sounds normal.  Abdominal: Soft. There is tenderness (Diffuse abdominal tenderness most significant to suprapubic without guarding or rebound tenderness. No peritoneal sign.).  Genitourinary:  Tenderness to bilateral low back without CVA tenderness.  Neurological: She is alert.  Skin: No rash noted.  Psychiatric: She has a normal mood and affect.  Nursing note and vitals reviewed.   ED Course  Procedures (including critical care time)  Patient presents with dysuria, and diarrhea. She does complain of feeling dehydrated. She appears nontoxic. Workup initiated. Will give IV fluid, pain medication and antinausea medication.  11:02 PM Urine shows evidence consistent with a urinary tract infection. Patient will be treated with Rocephin. Currently awaiting for her electrolytes panels. I did offer for pelvic examination of the patient declined. States that she has no concerns for possible STDs as she is not sexually active.  11:45 PM Pt received IVF and abx.  She can tolerates PO, and felt better.  Labs are otherwise reassuring.  Pt will be discharged with keflex, a short course of pain meds and a doctor's note as requested.  Labs Review Labs Reviewed  URINALYSIS, ROUTINE W REFLEX MICROSCOPIC (NOT AT [facility]) - Abnormal; Notable for the following:    APPearance CLOUDY (*)    Leukocytes, UA MODERATE (*)    All other components within normal limits  URINE MICROSCOPIC-ADD ON - Abnormal; Notable for the following:    Squamous Epithelial / LPF FEW (*)    Bacteria, UA MANY (*)    All other components within normal limits  CBC WITH DIFFERENTIAL/PLATELET  COMPREHENSIVE METABOLIC PANEL  LIPASE, BLOOD  POC URINE PREG, ED     Imaging Review No results found. I have personally reviewed and evaluated these images and lab results as part of my medical decision-making.   EKG Interpretation None      MDM   Final diagnoses:  Pyelonephritis    BP 109/68 mmHg  Pulse 62  Temp(Src) 98.4 F (36.9 C) (Oral)  Resp 18  Ht  (1.575 m)  Wt 165 lb (74.844 kg)  BMI 30.17 kg/m2  SpO2 97%     Fayrene Helper, PA-C 05/03/15 2354  Lorre Nick, MD 05/05/15 [phone]

## 2015-05-03 NOTE — ED Notes (Signed)
Nurse drawing labs. 

## 2015-05-03 NOTE — ED Notes (Addendum)
Patient c/o left flank pain, painful urination with foul odor, and diarrhea x2 days. States symptoms worsened last night. Patient has not taken any medications for these symptoms. Patient states she also feels dehydrated.

## 2015-05-03 NOTE — Discharge Instructions (Signed)

## 2015-05-06 ENCOUNTER — Emergency Department (HOSPITAL_COMMUNITY)
Admission: EM | Admit: 2015-05-06 | Discharge: 2015-05-06 | Disposition: A | Discharge status: Home / Self Care | Payer: Self-pay | Attending: Emergency Medicine | Admitting: Emergency Medicine

## 2015-05-06 ENCOUNTER — Emergency Department (HOSPITAL_COMMUNITY): Payer: Self-pay

## 2015-05-06 ENCOUNTER — Encounter (HOSPITAL_COMMUNITY): Payer: Self-pay | Admitting: *Deleted

## 2015-05-06 DIAGNOSIS — Z792 Long term (current) use of antibiotics: Secondary | ICD-10-CM | POA: Insufficient documentation

## 2015-05-06 DIAGNOSIS — R1032 Left lower quadrant pain: Secondary | ICD-10-CM | POA: Insufficient documentation

## 2015-05-06 DIAGNOSIS — Z3202 Encounter for pregnancy test, result negative: Secondary | ICD-10-CM | POA: Insufficient documentation

## 2015-05-06 DIAGNOSIS — Z87442 Personal history of urinary calculi: Secondary | ICD-10-CM | POA: Insufficient documentation

## 2015-05-06 DIAGNOSIS — N390 Urinary tract infection, site not specified: Secondary | ICD-10-CM | POA: Insufficient documentation

## 2015-05-06 DIAGNOSIS — Z720 Tobacco use: Secondary | ICD-10-CM | POA: Insufficient documentation

## 2015-05-06 LAB — URINALYSIS, ROUTINE W REFLEX MICROSCOPIC
BILIRUBIN URINE: NEGATIVE
Glucose, UA: NEGATIVE mg/dL
Hgb urine dipstick: NEGATIVE
Ketones, ur: NEGATIVE mg/dL
LEUKOCYTES UA: NEGATIVE
NITRITE: NEGATIVE
PH: 5.5 (ref 5.0–8.0)
Protein, ur: NEGATIVE mg/dL
SPECIFIC GRAVITY, URINE: 1.021 (ref 1.005–1.030)
UROBILINOGEN UA: 0.2 mg/dL (ref 0.0–1.0)

## 2015-05-06 LAB — BASIC METABOLIC PANEL
ANION GAP: 7 (ref 5–15)
BUN: 9 mg/dL (ref 6–20)
CALCIUM: 9.2 mg/dL (ref 8.9–10.3)
CHLORIDE: 106 mmol/L (ref 101–111)
CO2: 23 mmol/L (ref 22–32)
CREATININE: 0.65 mg/dL (ref 0.44–1.00)
GFR calc non Af Amer: >60 mL/min (ref >60)
Glucose, Bld: 93 mg/dL (ref 65–99)
Potassium: 4 mmol/L (ref 3.5–5.1)
SODIUM: 136 mmol/L (ref 135–145)

## 2015-05-06 LAB — HEPATIC FUNCTION PANEL
ALK PHOS: 54 U/L (ref 38–126)
ALT: 55 U/L — AB (ref 14–54)
AST: 65 U/L — AB (ref 15–41)
Albumin: 4.2 g/dL (ref 3.5–5.0)
Bilirubin, Direct: <0.1 mg/dL — ABNORMAL LOW (ref 0.1–0.5)
TOTAL PROTEIN: 7.5 g/dL (ref 6.5–8.1)
Total Bilirubin: 0.7 mg/dL (ref 0.3–1.2)

## 2015-05-06 LAB — CBC
HCT: 45.3 % (ref 36.0–46.0)
HEMOGLOBIN: 15.5 g/dL — AB (ref 12.0–15.0)
MCH: 29.4 pg (ref 26.0–34.0)
MCHC: 34.2 g/dL (ref 30.0–36.0)
MCV: 86 fL (ref 78.0–100.0)
PLATELETS: 277 10*3/uL (ref 150–400)
RBC: 5.27 MIL/uL — AB (ref 3.87–5.11)
RDW: 12.6 % (ref 11.5–15.5)
WBC: 7.3 10*3/uL (ref 4.0–10.5)

## 2015-05-06 LAB — PREGNANCY, URINE: Preg Test, Ur: NEGATIVE

## 2015-05-06 IMAGING — CT CT ABD-PELV W/O CM
2 of 3 series · 17 of 41 positions shown, 19 images · non-contrast
Comparison: [DATE]

CLINICAL DATA: Left flank pain for 5 days and history of renal
stones

EXAM:
CT ABDOMEN AND PELVIS WITHOUT CONTRAST
TECHNIQUE: Multidetector CT imaging of the abdomen and pelvis was performed
following the standard protocol without IV contrast.

[Series 3: coronal · coronal · 0.69mm/px · 3 of 94 slices shown]
[im 32/94  soft-tissue]
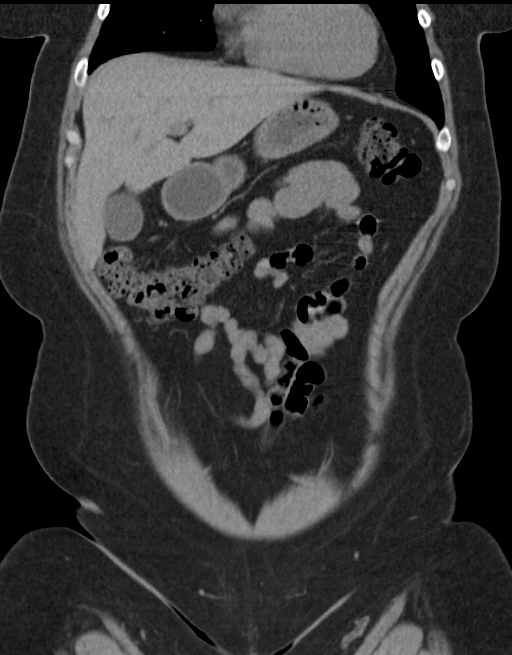
[im 42/94  soft-tissue]
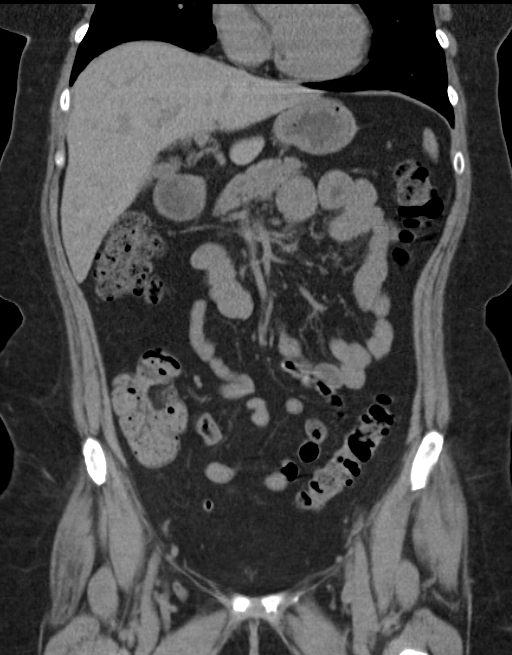
[im 52/94  soft-tissue]
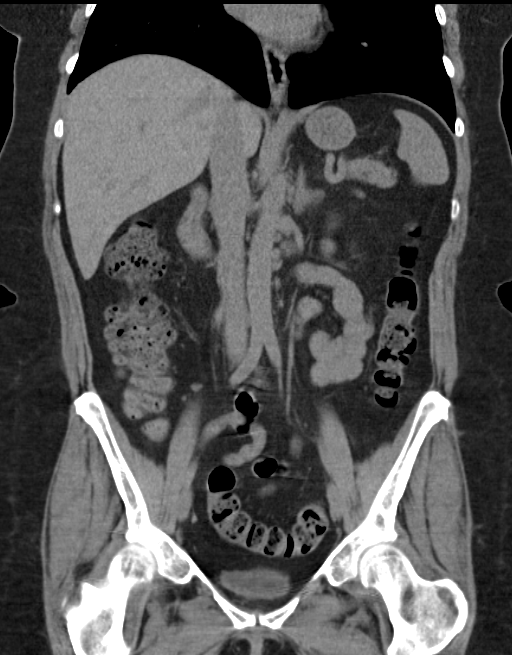

[Series 6: lung · axial · 0.74mm/px · z∈[+1376,+1496]mm · 14 of 27 slices shown, 16 images]
[im 2/27  soft-tissue]
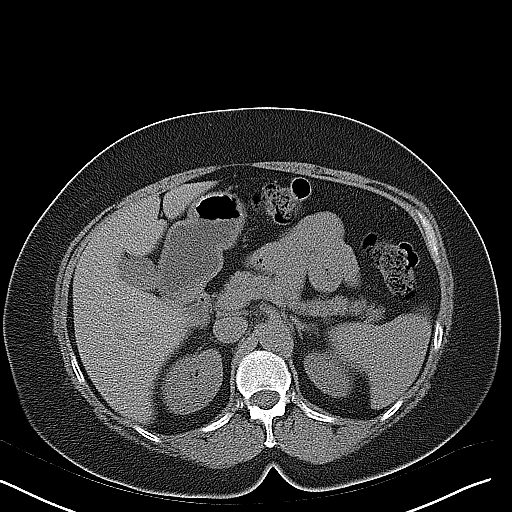
[im 2/27  bone]
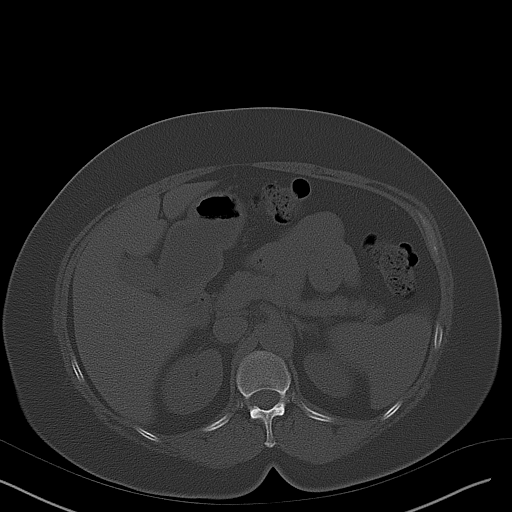
[im 4/27  soft-tissue]
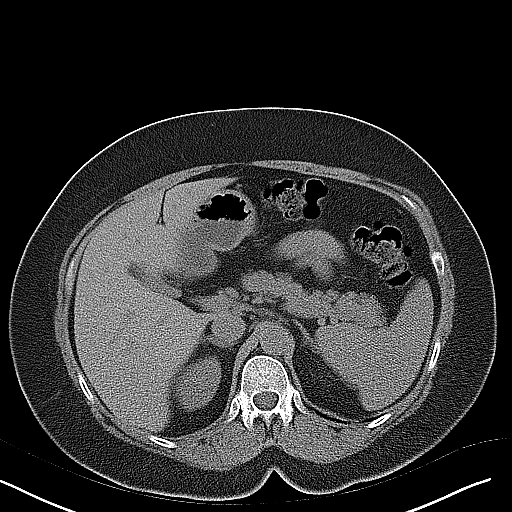
[im 6/27  soft-tissue]
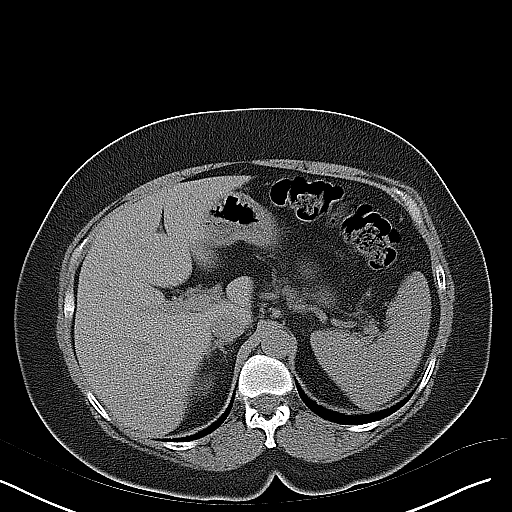
[im 8/27  soft-tissue]
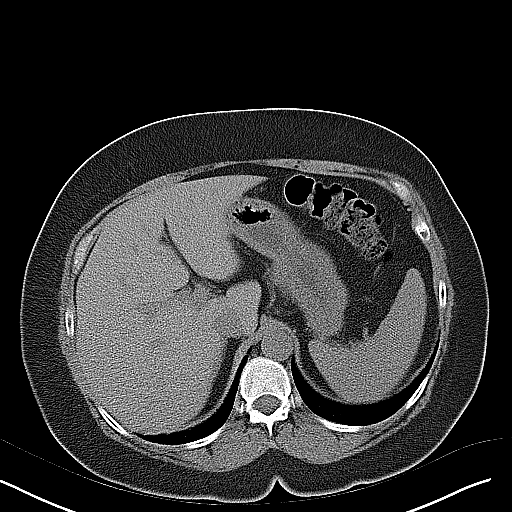
[im 10/27  soft-tissue]
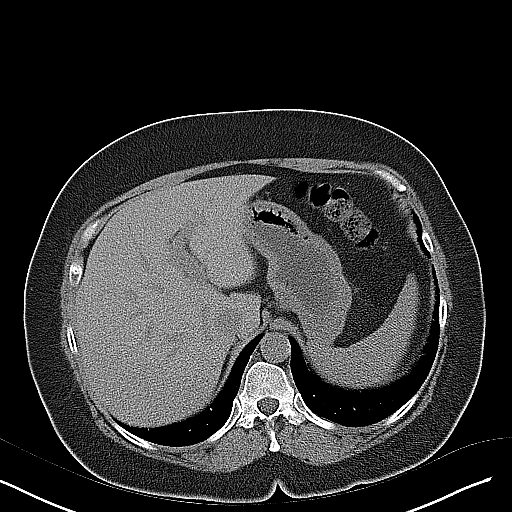
[im 11/27  soft-tissue]
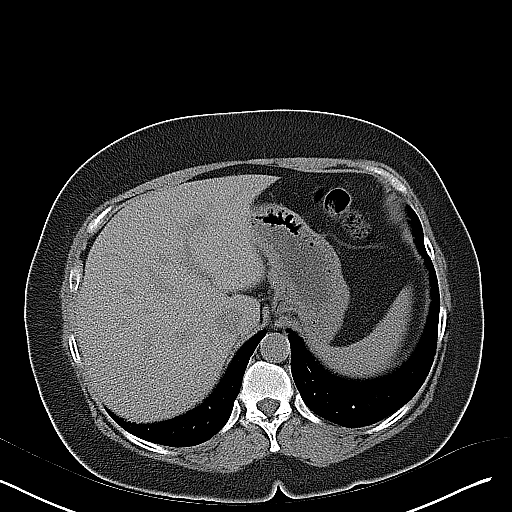
[im 13/27  soft-tissue]
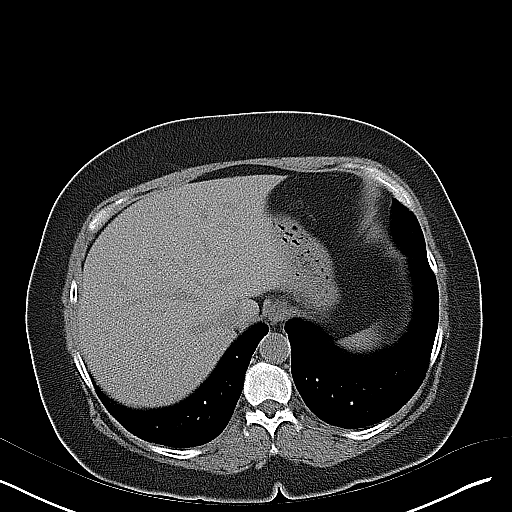
[im 15/27  soft-tissue]
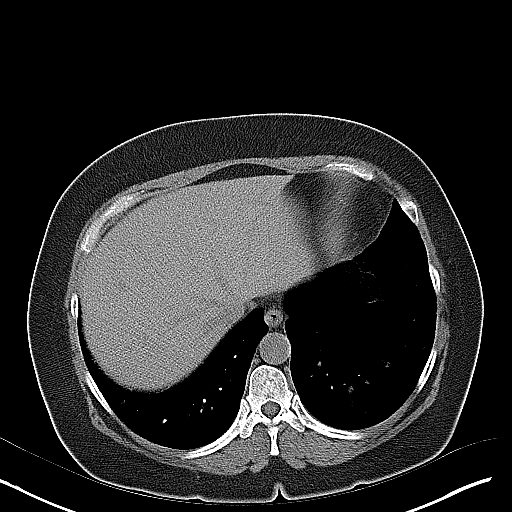
[im 17/27  soft-tissue]
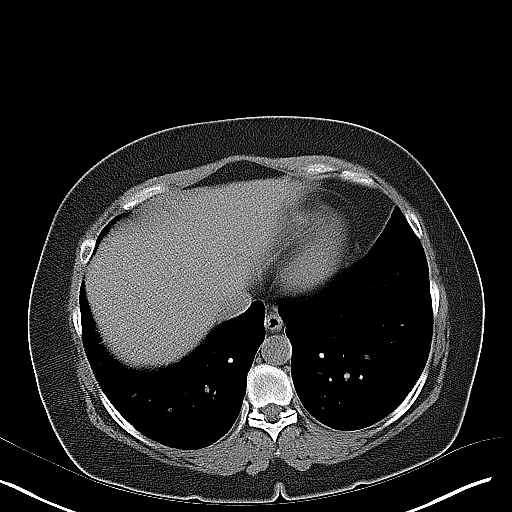
[im 17/27  bone]
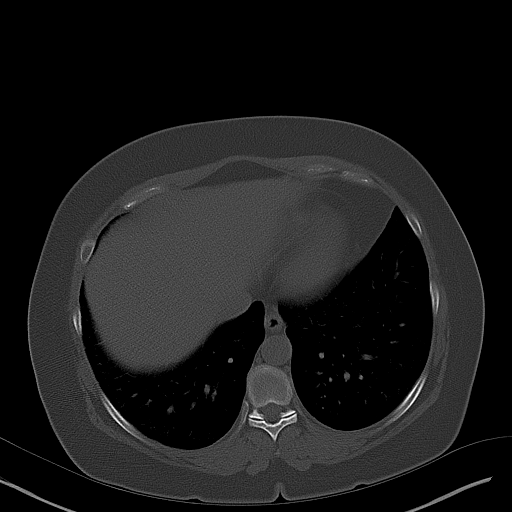
[im 18/27  soft-tissue]
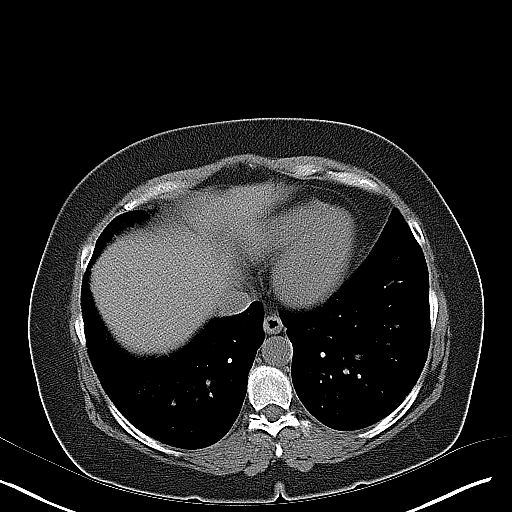
[im 20/27  soft-tissue]
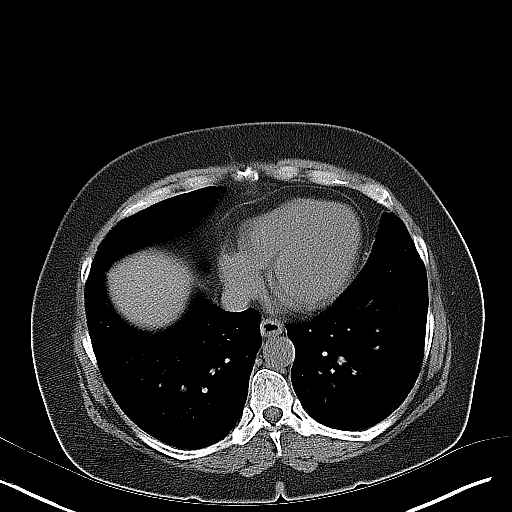
[im 22/27  soft-tissue]
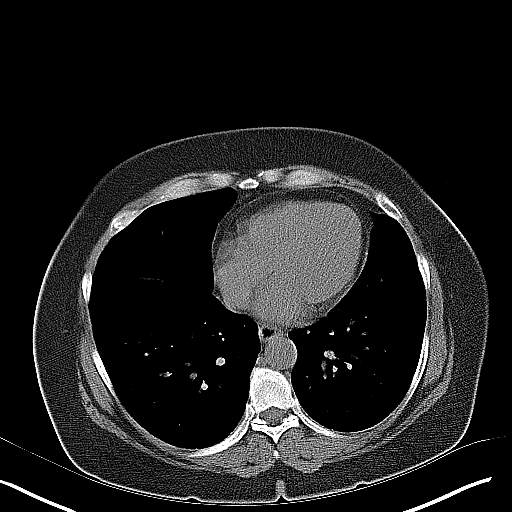
[im 24/27  soft-tissue]
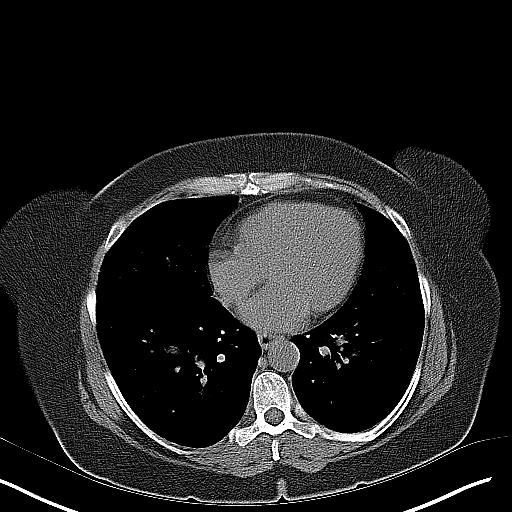
[im 26/27  soft-tissue]
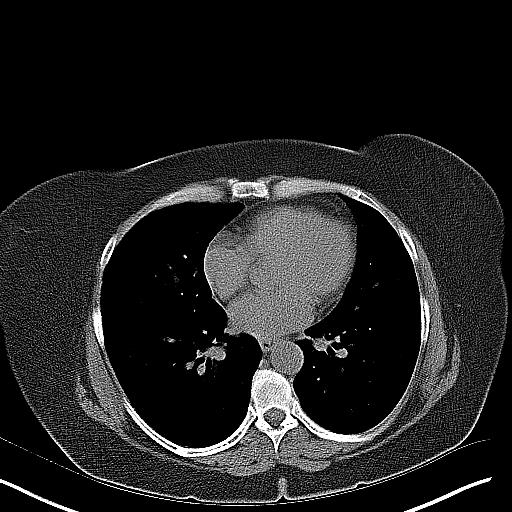

[17 of 41 positions shown; findings below may reference images not displayed]

FINDINGS: Lower chest and abdominal wall:  No contributory findings.

Hepatobiliary: No focal liver abnormality.No evidence of biliary
obstruction or stone.

Pancreas: Unremarkable.

Spleen: Unremarkable.

Adrenals/Urinary Tract: Negative adrenals. No hydronephrosis or
stone. Unremarkable bladder.

Reproductive:No pathologic findings.

Stomach/Bowel:  No obstruction. No appendicitis.

Vascular/Lymphatic: No acute vascular abnormality. No mass or
adenopathy.

Peritoneal: No ascites or pneumoperitoneum.

Musculoskeletal: Transitional lumbosacral anatomy with rudimentary
L5-S1 disc. Mild L4-5 retrolisthesis with foraminal narrowing.
IMPRESSION: Negative.  No explanation for abdominal pain.

## 2015-05-06 MED ORDER — HYDROCODONE-ACETAMINOPHEN 5-325 MG PO TABS: 1.0000 | ORAL_TABLET | ORAL | Status: DC | PRN

## 2015-05-06 MED ORDER — HYDROCODONE-ACETAMINOPHEN 5-325 MG PO TABS
2.0000 | ORAL_TABLET | Freq: Once | ORAL | Status: AC
  Administered 2015-05-06: 2 via ORAL
  Filled 2015-05-06: qty 2

## 2015-05-06 MED ORDER — KETOROLAC TROMETHAMINE 30 MG/ML IJ SOLN
30.0000 mg | Freq: Once | INTRAMUSCULAR | Status: AC
  Administered 2015-05-06: 30 mg via INTRAVENOUS
  Filled 2015-05-06: qty 1

## 2015-05-06 MED ORDER — MORPHINE SULFATE (PF) 4 MG/ML IV SOLN
4.0000 mg | Freq: Once | INTRAVENOUS | Status: AC
  Administered 2015-05-06: 4 mg via INTRAVENOUS
  Filled 2015-05-06: qty 1

## 2015-05-06 MED ORDER — SODIUM CHLORIDE 0.9 % IV BOLUS (SEPSIS)
1000.0000 mL | Freq: Once | INTRAVENOUS | Status: AC
  Administered 2015-05-06: 1000 mL via INTRAVENOUS

## 2015-05-06 MED ORDER — ONDANSETRON HCL 4 MG/2ML IJ SOLN
4.0000 mg | Freq: Once | INTRAMUSCULAR | Status: AC
  Administered 2015-05-06: 4 mg via INTRAVENOUS
  Filled 2015-05-06: qty 2

## 2015-05-06 MED ORDER — HYDROCODONE-IBUPROFEN 7.5-200 MG PO TABS: 1.0000 | ORAL_TABLET | Freq: Four times a day (QID) | ORAL | Status: DC | PRN

## 2015-05-06 NOTE — ED Notes (Signed)
Patient was alert, oriented and stable upon discharge. RN went over AVS and patient had no further questions. Patient was alert, oriented and stable upon discharge. RN went over AVS and patient had no further questions.

## 2015-05-06 NOTE — ED Notes (Addendum)
Patient reports flank pain and burning when urinating

## 2015-05-06 NOTE — Progress Notes (Signed)
EDCM spoke to patient at bedside. Patient confirms she does not have a pcp or insurance living in [place].  EDCM provided patient with contact information to [facility], informed patient of services there.  EDCM also provided patient with list of pcps who accept self pay patients, list of discount pharmacies and websites needymeds.org and GoodRX.com for medication assistance, phone number to inquire about the orange card, phone number to inquire about Medic[employer], phone number to inquire about the Affordable Care Act, financial resources in the community such as local churches, [employer], [employer] ministries, and dental assistance for uninsured patients.  Patient thankful  for resources.  No further EDCM needs at this time. 

## 2015-05-06 NOTE — ED Notes (Signed)
Pt was seen on 10/14 dx with kidney infection, pt is taking prescribed abx. Reports pain has increased, has chills and feels dehydrates. Left sided pain 10/10.

## 2015-05-06 NOTE — Discharge Instructions (Signed)
Return to the ED with any concerns including vomiting and not able to keep down liquids or antibiotics, fever/chills, difficulty breathing, decreased level of alertness/lethargy, or any other alarming symptoms

## 2015-05-06 NOTE — ED Provider Notes (Signed)
CSN: [id number]     Arrival date & time 05/06/15  1324 History   First MD Initiated Contact with Patient 05/06/15 1359     Chief Complaint  Patient presents with  . kidney infection      (Consider location/radiation/quality/duration/timing/severity/associated sxs/prior Treatment) HPI Comments: 49 year old female with distant history of kidney stones who presents with back pain. Patient states that she presented on 10/14 for left-sided back pain radiating into her left lower quadrant that began approximately 5 days ago and has worsened to currently 10/10 in intensity. She endorses associated nausea, shakiness, and burning/pressure when urinating. She was diagnosed with a UTI and given ciprofloxacin but states that her symptoms have worsened despite being compliant with medication. She endorses chills and one episode of vomiting, no fevers. She has taken hydrocodone at home without relief of her back pain. No chest pain or shortness of breath.  The history is provided by the patient.    History reviewed. No pertinent past medical history. History reviewed. No pertinent past surgical history. History reviewed. No pertinent family history. Social History  Substance Use Topics  . Smoking status: Current Every Day Smoker -- 0.50 packs/day    Types: Cigarettes  . Smokeless tobacco: Never Used  . Alcohol Use: No     Comment: occ   OB History    No data available     Review of Systems 10 Systems reviewed and are negative for acute change except as noted in the HPI.    Allergies  Percocet  Home Medications   Prior to Admission medications   Medication Sig Start Date End Date Taking? Authorizing Provider  acetaminophen (TYLENOL) 500 MG tablet Take 1,500 mg by mouth every 6 (six) hours as needed for moderate pain or headache.    Yes Historical Provider, MD  ciprofloxacin (CIPRO) 500 MG tablet Take 1 tablet (500 mg total) by mouth 2 (two) times daily. 05/03/15  Yes Fayrene HelperBowie Tran, PA-C   clonazePAM (KLONOPIN) 0.5 MG tablet Take 0.5 mg by mouth 2 (two) times daily as needed for anxiety.   Yes Historical Provider, MD  diphenhydrAMINE (BENADRYL) 25 MG tablet Take 50 mg by mouth every 6 (six) hours as needed for allergies.   Yes Historical Provider, MD  HYDROcodone-acetaminophen (NORCO/VICODIN) 5-325 MG tablet Take 1 tablet by mouth every 6 (six) hours as needed for moderate pain. 05/03/15  Yes Fayrene HelperBowie Tran, PA-C  diazepam (VALIUM) 5 MG tablet Take 1 tablet (5 mg total) by mouth 2 (two) times daily. Patient not taking: Reported on 01/11/2015 12/31/14   Oswaldo ConroyVictoria Creech, PA-C  gabapentin (NEURONTIN) 100 MG capsule Take 1 pill on day one Take a pill with breakfast and lunch on day two Take a pill with breakfast lunch and dinner on day three and then continue to take 3 times daily Patient not taking: Reported on 08/02/2014 05/13/14   Toni Amendourtney Forcucci, PA-C  hydrocortisone (ANUSOL-HC) 2.5 % rectal cream Apply rectally 2 times daily for 7 days max. Patient not taking: Reported on 05/03/2015 01/11/15   Fayrene HelperBowie Tran, PA-C  meloxicam (MOBIC) 7.5 MG tablet Take 2 tablets (15 mg total) by mouth daily. Patient not taking: Reported on 01/11/2015 12/29/14   Antony MaduraKelly Humes, PA-C  methocarbamol (ROBAXIN) 500 MG tablet Take 1 tablet (500 mg total) by mouth 2 (two) times daily. Patient not taking: Reported on 01/11/2015 12/29/14   Antony MaduraKelly Humes, PA-C  predniSONE (DELTASONE) 20 MG tablet 2 tabs po daily x 5 days Patient not taking: Reported on 01/11/2015 12/31/14  Oswaldo Conroy, PA-C  psyllium (METAMUCIL SMOOTH TEXTURE) 28 % packet Take 1 packet by mouth 2 (two) times daily. Patient not taking: Reported on 05/03/2015 01/11/15   Fayrene Helper, PA-C   BP 100/66 mmHg  Pulse 60  Temp(Src) 98.7 F (37.1 C) (Oral)  Resp 16  SpO2 97% Physical Exam  Constitutional: She is oriented to person, place, and time. She appears well-developed and well-nourished. No distress.  Uncomfortable, laying on R side  HENT:  Head:  Normocephalic and atraumatic.  Moist mucous membranes  Eyes: Conjunctivae are normal. Pupils are equal, round, and reactive to light.  Neck: Neck supple.  Cardiovascular: Normal rate, regular rhythm and normal heart sounds.   No murmur heard. Pulmonary/Chest: Effort normal and breath sounds normal.  Abdominal: Soft. Bowel sounds are normal. She exhibits no distension.  Tenderness to palpation of suprapubic abd w/ no rebound or guarding  Genitourinary:  + L CVA tenderness  Musculoskeletal: She exhibits no edema.  Neurological: She is alert and oriented to person, place, and time.  Fluent speech  Skin: Skin is warm and dry.  Psychiatric: She has a normal mood and affect. Judgment normal.  Nursing note and vitals reviewed.   ED Course  Procedures (including critical care time) Labs Review Labs Reviewed  URINALYSIS, ROUTINE W REFLEX MICROSCOPIC (NOT AT [facility]) - Abnormal; Notable for the following:    APPearance CLOUDY (*)    All other components within normal limits  CBC - Abnormal; Notable for the following:    RBC 5.27 (*)    Hemoglobin 15.5 (*)    All other components within normal limits  HEPATIC FUNCTION PANEL - Abnormal; Notable for the following:    AST 65 (*)    ALT 55 (*)    Bilirubin, Direct <0.1 (*)    All other components within normal limits  BASIC METABOLIC PANEL  PREGNANCY, URINE    Imaging Review No results found. I have personally reviewed and evaluated these lab results as part of my medical decision-making.   EKG Interpretation None     Medications  morphine 4 MG/ML injection 4 mg (4 mg Intravenous Given 05/06/15 1554)  ketorolac (TORADOL) 30 MG/ML injection 30 mg (30 mg Intravenous Given 05/06/15 1554)  ondansetron (ZOFRAN) injection 4 mg (4 mg Intravenous Given 05/06/15 1554)  sodium chloride 0.9 % bolus 1,000 mL (1,000 mLs Intravenous New Bag/Given 05/06/15 1554)  ondansetron (ZOFRAN) injection 4 mg (4 mg Intravenous Given 05/06/15 1656)   HYDROcodone-acetaminophen (NORCO/VICODIN) 5-325 MG per tablet 2 tablet (2 tablets Oral Given 05/06/15 1656)    MDM   Final diagnoses:  None   left low back pain dysuria  49 year old female who presents with several days of worsening left-sided back pain and burning with urination and despite recent treatment with Cipro for UTI. Patient uncomfortable but nontoxic appearing at presentation. Vital signs unremarkable. Mild suprapubic tenderness and left CVA tenderness noted on exam. Obtained above labs which showed normal creatinine and reassuring WBC count. Gave the patient an IV fluid bolus, Zofran, Toradol, and morphine. Because of the patient's persistent left-sided pain despite antibiotic treatment, obtained a CT of the abdomen and pelvis to evaluate for infected stone.  I am signing the patient out to the oncoming attending. Her disposition is pending results of her CT scan. CT scan is reassuring, I anticipate that the patient will be able to go home with supportive care instructions as well as follow-up w/ PCP.  Laurence Spates, MD 05/06/15 [phone]

## 2015-05-23 ENCOUNTER — Encounter (HOSPITAL_COMMUNITY): Payer: Self-pay | Admitting: *Deleted

## 2015-05-23 ENCOUNTER — Emergency Department (HOSPITAL_COMMUNITY)
Admission: EM | Admit: 2015-05-23 | Discharge: 2015-05-24 | Disposition: A | Discharge status: Home / Self Care | Payer: Self-pay | Attending: Emergency Medicine | Admitting: Emergency Medicine

## 2015-05-23 DIAGNOSIS — G43809 Other migraine, not intractable, without status migrainosus: Secondary | ICD-10-CM | POA: Insufficient documentation

## 2015-05-23 DIAGNOSIS — Z720 Tobacco use: Secondary | ICD-10-CM | POA: Insufficient documentation

## 2015-05-23 HISTORY — DX: Migraine, unspecified, not intractable, without status migrainosus: G43.909

## 2015-05-23 MED ORDER — PROCHLORPERAZINE EDISYLATE 5 MG/ML IJ SOLN
10.0000 mg | Freq: Four times a day (QID) | INTRAMUSCULAR | Status: DC | PRN
  Administered 2015-05-23: 10 mg via INTRAVENOUS
  Filled 2015-05-23: qty 2

## 2015-05-23 MED ORDER — MAGNESIUM SULFATE 2 GM/50ML IV SOLN
2.0000 g | Freq: Once | INTRAVENOUS | Status: AC
  Administered 2015-05-23: 2 g via INTRAVENOUS
  Filled 2015-05-23: qty 50

## 2015-05-23 MED ORDER — DIPHENHYDRAMINE HCL 50 MG/ML IJ SOLN
25.0000 mg | Freq: Once | INTRAMUSCULAR | Status: AC
  Administered 2015-05-23: 25 mg via INTRAVENOUS
  Filled 2015-05-23: qty 1

## 2015-05-23 MED ORDER — SODIUM CHLORIDE 0.9 % IV BOLUS (SEPSIS)
1000.0000 mL | Freq: Once | INTRAVENOUS | Status: AC
  Administered 2015-05-23: 1000 mL via INTRAVENOUS

## 2015-05-23 NOTE — ED Notes (Signed)
Pt c/o migraine since last night. States she has tried advil but vomited after taking it. Reports hx of migraines but not for the past few years.

## 2015-05-23 NOTE — ED Provider Notes (Signed)
CSN: [id number]     Arrival date & time 05/23/15  2039 History   First MD Initiated Contact with Patient 05/23/15 2325     Chief Complaint  Patient presents with  . Migraine     (Consider location/radiation/quality/duration/timing/severity/associated sxs/prior Treatment) HPI   Blood pressure 110/81, pulse 81, temperature 97.9 F (36.6 C), temperature source Oral, resp. rate 14, SpO2 95 %.  Kerry Young is a 49 y.o. female complaining of right frontal, periorbital, auricular and lateral cervical migraine associated with photophobia, phonophobia and exacerbated by motion. Onset last night. Patient is tried to take ibuprofen and Tylenol at home with little relief, states the pain is so severe she is vomiting. Patient 2 episodes of nonbloody, nonbilious, no coffee-ground emesis today. Patient states she has a history of migraines, this is consistent with prior episodes, has not had an exacerbation in 2 years. Pt denies fever, rash, confusion, cervicalgia, LOC/syncope,numbness, weakness, dysarthria, ataxia, thunderclap onset, exacerbation with exertion or valsalva, exacerbation in morning, CP, SOB, abdominal pain. On review of systems patient notes a bilateral blurred vision consistent with prior migraines.   Past Medical History  Diagnosis Date  . Migraines    History reviewed. No pertinent past surgical history. No family history on file. Social History  Substance Use Topics  . Smoking status: Current Every Day Smoker -- 0.50 packs/day    Types: Cigarettes  . Smokeless tobacco: Never Used  . Alcohol Use: No     Comment: occ   OB History    No data available     Review of Systems  10 systems reviewed and found to be negative, except as noted in the HPI.   Allergies  Percocet  Home Medications   Prior to Admission medications   Medication Sig Start Date End Date Taking? Authorizing Provider  clonazePAM (KLONOPIN) 0.5 MG tablet Take 0.5 mg by mouth 2 (two) times daily as  needed for anxiety.   Yes Historical Provider, MD  butalbital-acetaminophen-caffeine (FIORICET) 50-325-40 MG tablet Take 1 tablet by mouth every 6 (six) hours as needed for headache. 05/24/15   Dawit Tankard, PA-C  ciprofloxacin (CIPRO) 500 MG tablet Take 1 tablet (500 mg total) by mouth 2 (two) times daily. Patient not taking: Reported on 05/24/2015 05/03/15   Fayrene Helper, PA-C  diazepam (VALIUM) 5 MG tablet Take 1 tablet (5 mg total) by mouth 2 (two) times daily. Patient not taking: Reported on 01/11/2015 12/31/14   Oswaldo Conroy, PA-C  gabapentin (NEURONTIN) 100 MG capsule Take 1 pill on day one Take a pill with breakfast and lunch on day two Take a pill with breakfast lunch and dinner on day three and then continue to take 3 times daily Patient not taking: Reported on 08/02/2014 05/13/14   Terri Piedra, PA-C  HYDROcodone-acetaminophen (NORCO/VICODIN) 5-325 MG tablet Take 1 tablet by mouth every 4 (four) hours as needed. Patient not taking: Reported on 05/24/2015 05/06/15   Jerelyn Scott, MD  hydrocortisone (ANUSOL-HC) 2.5 % rectal cream Apply rectally 2 times daily for 7 days max. Patient not taking: Reported on 05/03/2015 01/11/15   Fayrene Helper, PA-C  meloxicam (MOBIC) 7.5 MG tablet Take 2 tablets (15 mg total) by mouth daily. Patient not taking: Reported on 01/11/2015 12/29/14   Antony Madura, PA-C  methocarbamol (ROBAXIN) 500 MG tablet Take 1 tablet (500 mg total) by mouth 2 (two) times daily. Patient not taking: Reported on 01/11/2015 12/29/14   Antony Madura, PA-C  predniSONE (DELTASONE) 20 MG tablet 2 tabs po daily x  5 days Patient not taking: Reported on 01/11/2015 12/31/14   Oswaldo ConroyVictoria Creech, PA-C  psyllium (METAMUCIL SMOOTH TEXTURE) 28 % packet Take 1 packet by mouth 2 (two) times daily. Patient not taking: Reported on 05/03/2015 01/11/15   Fayrene HelperBowie Tran, PA-C   BP 113/74 mmHg  Pulse 89  Temp(Src) 98.6 F (37 C) (Oral)  Resp 20  SpO2 98% Physical Exam  Constitutional: She is oriented to  person, place, and time. She appears well-developed and well-nourished.  HENT:  Head: Normocephalic and atraumatic.    Mouth/Throat: Oropharynx is clear and moist.  Eyes: Conjunctivae and EOM are normal. Pupils are equal, round, and reactive to light.  No TTP of maxillary or frontal sinuses  No TTP or induration of temporal arteries bilaterally  Neck: Normal range of motion. Neck supple.  FROM to C-spine. Pt can touch chin to chest without discomfort. No TTP of midline cervical spine.   Cardiovascular: Normal rate, regular rhythm and intact distal pulses.   Pulmonary/Chest: Effort normal and breath sounds normal. No respiratory distress. She has no wheezes. She has no rales. She exhibits no tenderness.  Abdominal: Soft. Bowel sounds are normal. There is no tenderness.  Musculoskeletal: Normal range of motion. She exhibits tenderness. She exhibits no edema.  No focal tenderness, diffusely tender to light palpation as diagrammed  Neurological: She is alert and oriented to person, place, and time. No cranial nerve deficit.  Follows commands, Clear, goal oriented speech, Strength is 5 out of 5x4 extremities, patient ambulates with a coordinated in nonantalgic gait. Sensation is grossly intact.    Nursing note and vitals reviewed.   ED Course  Procedures (including critical care time) Labs Review Labs Reviewed - No data to display  Imaging Review No results found. I have personally reviewed and evaluated these images and lab results as part of my medical decision-making.   EKG Interpretation None      MDM   Final diagnoses:  Other migraine without status migrainosus, not intractable    Filed Vitals:   05/23/15 2114 05/23/15 2345 05/24/15 0000 05/24/15 0111  BP: 110/81 108/70 113/74   Pulse: 81 74 74 89  Temp: 97.9 F (36.6 C)   98.6 F (37 C)  TempSrc: Oral     Resp: 14   20  SpO2: 95% 79% 89% 98%    Medications  prochlorperazine (COMPAZINE) injection 10 mg (10 mg  Intravenous Given 05/23/15 2355)  sodium chloride 0.9 % bolus 1,000 mL (1,000 mLs Intravenous New Bag/Given 05/23/15 2356)  diphenhydrAMINE (BENADRYL) injection 25 mg (25 mg Intravenous Given 05/23/15 2355)  magnesium sulfate IVPB 2 g 50 mL (0 g Intravenous Stopped 05/24/15 0040)    Elise BenneKaren M Agar is 49 y.o. female presenting with headache similar to prior migraines. Neuro exam nonfocal. Patient is diffusely exquisitely tender to palpation along the right face, scalp and lateral neck and shoulder. HA. Presentation is like pts typical HA and non concerning for [facility], ICH, Meningitis, or temporal arteritis. Pt is afebrile with no focal neuro deficits, nuchal rigidity, or change in vision. Pt is to follow up with PCP to discuss prophylactic medication. Pt verbalizes understanding and is agreeable with plan to dc.  Patient reports improvement with headache cocktail, states that her restless leg is bothering her. Declining blood pressure because the cuff is painful to her.  Evaluation does not show pathology that would require ongoing emergent intervention or inpatient treatment. Pt is hemodynamically stable and mentating appropriately. Discussed findings and plan with patient/guardian, who agrees  with care plan. All questions answered. Return precautions discussed and outpatient follow up given.   New Prescriptions   BUTALBITAL-ACETAMINOPHEN-CAFFEINE (FIORICET) 50-325-40 MG TABLET    Take 1 tablet by mouth every 6 (six) hours as needed for headache.         Wynetta Emery, PA-C 05/24/15 1610  Pricilla Loveless, MD 05/24/15 1623

## 2015-05-24 MED ORDER — BUTALBITAL-APAP-CAFFEINE 50-325-40 MG PO TABS: 1.0000 | ORAL_TABLET | Freq: Four times a day (QID) | ORAL | Status: DC | PRN

## 2015-05-24 NOTE — ED Notes (Signed)
Pt left with all her belongings and ambulated out of the treatment area.  

## 2015-05-24 NOTE — Discharge Instructions (Signed)
Do not hesitate to return to the emergency room for any new, worsening or concerning symptoms.  Please obtain primary care using resource guide below. Let them know that you were seen in the emergency room and that they will need to obtain records for further outpatient management.   Migraine Headache A migraine headache is very bad, throbbing pain on one or both sides of your head. Talk to your doctor about what things may bring on (trigger) your migraine headaches. HOME CARE  Only take medicines as told by your doctor.  Lie down in a dark, quiet room when you have a migraine.  Keep a journal to find out if certain things bring on migraine headaches. For example, write down:  What you eat and drink.  How much sleep you get.  Any change to your diet or medicines.  Lessen how much alcohol you drink.  Quit smoking if you smoke.  Get enough sleep.  Lessen any stress in your life.  Keep lights dim if bright lights bother you or make your migraines worse. GET HELP RIGHT AWAY IF:   Your migraine becomes really bad.  You have a fever.  You have a stiff neck.  You have trouble seeing.  Your muscles are weak, or you lose muscle control.  You lose your balance or have trouble walking.  You feel like you will pass out (faint), or you pass out.  You have really bad symptoms that are different than your first symptoms. MAKE SURE YOU:   Understand these instructions.  Will watch your condition.  Will get help right away if you are not doing well or get worse.   This information is not intended to replace advice given to you by your health care provider. Make sure you discuss any questions you have with your health care provider.   Document Released: 04/14/2008 Document Revised: 09/28/2011 Document Reviewed: 03/13/2013 Elsevier Interactive Patient Education 2016 [employer].   Emergency Department Resource Guide 1) Find a Doctor and Pay Out of Pocket Although you  won't have to find out who is covered by your insurance plan, it is a good idea to ask around and get recommendations. You will then need to call the office and see if the doctor you have chosen will accept you as a new patient and what types of options they offer for patients who are self-pay. Some doctors offer discounts or will set up payment plans for their patients who do not have insurance, but you will need to ask so you aren't surprised when you get to your appointment.  2) Contact Your Local Health Department Not all health departments have doctors that can see patients for sick visits, but many do, so it is worth a call to see if yours does. If you don't know where your local health department is, you can check in your phone book. The CDC also has a tool to help you locate your state's health department, and many state websites also have listings of all of their local health departments.  3) Find a Walk-in Clinic If your illness is not likely to be very severe or complicated, you may want to try a walk in clinic. These are popping up all over the country in pharmacies, drugstores, and shopping centers. They're usually staffed by nurse practitioners or physician assistants that have been trained to treat common illnesses and complaints. They're usually fairly quick and inexpensive. However, if you have serious medical issues or chronic medical problems, these are  probably not your best option.  No Primary Care Doctor: - Call Health Connect at  [phone] - they can help you locate a primary care doctor that  accepts your insurance, provides certain services, etc. - Physician Referral Service- [phone]  Chronic Pain Problems: Organization         Address  Phone   Notes  [facility]  [phone] Patients need to be referred by their primary care doctor.   Medication Assistance: Organization         Address  Phone   Notes  [facility] Medication [facility] [address] [place], [place] [address] [phone] --Must be a resident of [facility] -- Must have NO insurance coverage whatsoever (no Medicaid/ Medicare, etc.) -- The pt. MUST have a primary care doctor that directs their care regularly and follows them in the community   [facility]  [phone]   [employer]  [phone]    Agencies that provide inexpensive medical care: Organization         Address  Phone   Notes  [facility]  [phone]   [facility]    [phone]   [facility] [address] [place], [place] [address] [phone]   [facility] [address] [place] [place] [phone]   Planned Parenthood    [phone]   [facility]    [phone]   [facility]  [address], [place] Phone:  [phone], Fax:  [phone] Hours of Operation:  9 am - 6 pm, M-F.  Also accepts Medicaid/Medicare and self-pay.  [facility]  [address], [place] Phone: [phone], Fax: [phone]. Hours of Operation:  8:30 am - 5:30 pm, M-F.  Also accepts Medicaid and self-pay.  [facility] [place] [address], [place] Phone: [phone]   [facility] [address] Natasha Bence [place], [place] [phone], Ext. [phone] Mondays & Thursdays: 7-9 AM.  First 15 patients are seen on a first come, first serve basis.    Medicaid-accepting [facility] Providers:  Organization         Address  Phone   Notes  [facility] [address],  [phone] Also accepts self-pay patients.  [facility] [address] Laurell Josephs [place], [place]  [phone]   [facility] [address], [place] [phone]   [facility] [address] [place] [phone]   Renaye Rakers [address], [place]   [phone] Only accepts [place] patients after they have their name applied to their card.   Self-Pay (no insurance) in [facility]:  Organization         Address  Phone   Notes  Sickle Cell Patients, [facility] [address] [place], [place] [phone]   [facility] [address] [place], [place] [phone]   [facility] [place]  [address], [place] [phone]   [facility]/Dr. Osei-Bonsu  [address] [place] or [address], [place] [phone] Phone number for both [place] and [place] locations is the same.  [facility]  [address], [place] [phone]   [facility] [address] [place] or [address] [phone]   [facility] [address] [place] [phone], phone; [phone], fax Sees patients 1st and 3rd Saturday of every month.  Must not qualify for public or private insurance (i.e. Medicaid, Medicare, [facility] Health Choice, Veterans' Benefits)  Household income should be no more than 200% of the poverty level The clinic cannot treat you if you are pregnant or think you are pregnant  Sexually transmitted diseases are not treated at the clinic.    Dental Care: Organization         Address  Phone  Notes  [facility] Department of [facility] [address] [place], [place] [phone] Accepts children up to age 50 who are enrolled in [place] or [place] Health Choice; pregnant women with a Medicaid card; and children who have applied for Medicaid or [place] Health Choice, but were declined, whose parents can pay a reduced fee at time of service.  [facility] Department of [facility]  [address], [place] [phone] Accepts  children up to age 50 who are enrolled in [place] or [place] Health Choice; pregnant women with a Medicaid card; and children who have applied for Medicaid or [place] Health Choice, but were declined, whose parents can pay a reduced fee at time of service.  [facility]  [address] [place], [place] [phone] Patients are seen by appointment only. Walk-ins are not accepted. [facility] will see patients 42 years of age and older. Monday - Tuesday (8am-5pm) Most Wednesdays (8:30-5pm) $30 per visit, cash only  [facility] Adult Dental Access PROGRAM  [address], [facility] [phone] Patients are seen by appointment only. Walk-ins are not accepted. [facility] will see patients 43 years of age and older. One Wednesday Evening (Monthly: Volunteer Based).  $30 per visit, cash only  [employer]  [phone] for adults; Children under age 46, call Graduate Pediatric Dentistry at [phone]. Children aged 61-14, please call [phone] to request a pediatric application.  Dental services are provided in all areas of dental care including fillings, crowns and bridges, complete and partial dentures, implants, gum treatment, root canals, and extractions. Preventive care is also provided. Treatment is provided to both adults and children. Patients are selected via a lottery and there is often a waiting list.   [facility] [address] [place]  [phone] [email or web address]   [facility] [address] [place], [place] [phone], Ext. [phone] Second and Fourth Thursday of each month, opens at 6:30 AM; Clinic ends at 9 AM.  Patients are seen on a first-come first-served basis, and a limited number are seen during each clinic.   [facility]  [address] Ether Griffins [place], [place] [phone]   Eligibility Requirements You must have lived in [place], [place], or [place] counties for at least the  last three months.   You cannot be eligible for state or federal sponsored [place], including [employer], [place], or [employer].   You generally cannot be eligible for healthcare insurance through your employer.    How to apply: Eligibility screenings are held every Tuesday and Wednesday afternoon from 1:00 pm until 4:00 pm. You do not need an appointment for the interview!  [facility]  [address], [place], [place] [phone]   [facility]  [phone]   [facility]  [phone]   [facility]  [phone]    Behavioral Health Resources in the Community: Intensive Outpatient Programs Organization         Address  Phone  Notes  [facility] [address], [place], [place] [phone]   [facility] [address], [place], [place] [phone]   [facility] [address], [place], [place]  [phone]   [facility] [address],  [place], [place] [phone] or [phone]   Substance Abuse Resources Organization         Address  Phone  Notes  [facility]  [phone]   [facility]  [phone]   [facility]  [phone]   [facility]  [phone]   Residential & Outpatient Substance Abuse Program  [phone]   Psychological Services Organization         Address  Phone  Notes  [facility]  [phone]- [phone]   [facility]  [phone]- [phone]   [facility] [address], [place] [phone] or [phone]    Mobile Crisis Teams Organization         Address  Phone  Notes  [facility], Mobile Crisis Care Unit  [phone]   [facility]  [address] [place], [place] [phone]   Doristine LocksSharon DeEsch [address] [place] [phone]     Self-Help/Support Groups Organization         Address  Phone             Notes  [facility] [place] - variety of support groups  336- [phone] Call for more information  Narcotics Anonymous (NA), [facility] [address], [place]  2 meetings at this location   Statisticianesidential Treatment Programs Organization         Address  Phone  Notes  [facility] [address],    [place]  [phone]   [facility]  [address], [place] [phone], [place], [place] [phone]   [facility] [address], [place] [phone] Admissions: 8am-3pm M-F  [facility] [address]    [place], [place] [phone]   [facility] [address], [place], [place] [phone]   [facility] [address]  [place], [place] [phone]   [facility] - Intensive Outpatient [address], [place], [place] [phone]   [facility] ([facility] [address]  [place], [place] [phone] or [phone]   [facility] ([facility]) [address] [place], [place] [phone] Accepts Medicaid  [facility] [address]  [place] [phone] Substance Abuse/Addiction Treatment   [facility] Resources Organization         Address  Phone  Notes  [facility]  [phone](888) [phone]   Angie FavaJulie Brannon, PhD [address], [address] [place], [place]   [phone](336) [phone] or [phone](336) [phone]   [facility]   [address] [place], [place] [phone]   [facility] [address], [place], [place] [phone](336) [phone] Insurance/Medicaid/sponsorship through [facility]  [facility] Families [address]  [place], [place] [phone] [place], [place] [phone]    Dr. Adele Schilder  [phone]   [facility] Dept. 1) [address] [place] 2) [place] 3)  [place] [address], [place] [phone]  [phone]   [place] [phone] or [phone] (After Hours)

## 2015-05-24 NOTE — ED Notes (Signed)
Pt is refusing blood pressure cuff.

## 2015-05-24 NOTE — ED Notes (Signed)
Magnesium stopped per MD order. Pt's legs are restless and painful.

## 2015-07-20 ENCOUNTER — Encounter (HOSPITAL_COMMUNITY): Payer: Self-pay | Admitting: Emergency Medicine

## 2015-07-20 ENCOUNTER — Emergency Department (HOSPITAL_COMMUNITY): Payer: Self-pay

## 2015-07-20 ENCOUNTER — Emergency Department (HOSPITAL_COMMUNITY)
Admission: EM | Admit: 2015-07-20 | Discharge: 2015-07-20 | Disposition: A | Discharge status: Home / Self Care | Payer: Self-pay | Attending: Emergency Medicine | Admitting: Emergency Medicine

## 2015-07-20 DIAGNOSIS — R52 Pain, unspecified: Secondary | ICD-10-CM | POA: Insufficient documentation

## 2015-07-20 DIAGNOSIS — G43909 Migraine, unspecified, not intractable, without status migrainosus: Secondary | ICD-10-CM | POA: Insufficient documentation

## 2015-07-20 DIAGNOSIS — F17210 Nicotine dependence, cigarettes, uncomplicated: Secondary | ICD-10-CM | POA: Insufficient documentation

## 2015-07-20 DIAGNOSIS — J40 Bronchitis, not specified as acute or chronic: Secondary | ICD-10-CM | POA: Insufficient documentation

## 2015-07-20 IMAGING — CR DG CHEST 2V
2 series · 2 of 2 positions shown · non-contrast
Comparison: [DATE].

CLINICAL DATA: Cough.

EXAM:
CHEST  2 VIEW

[chest pa]
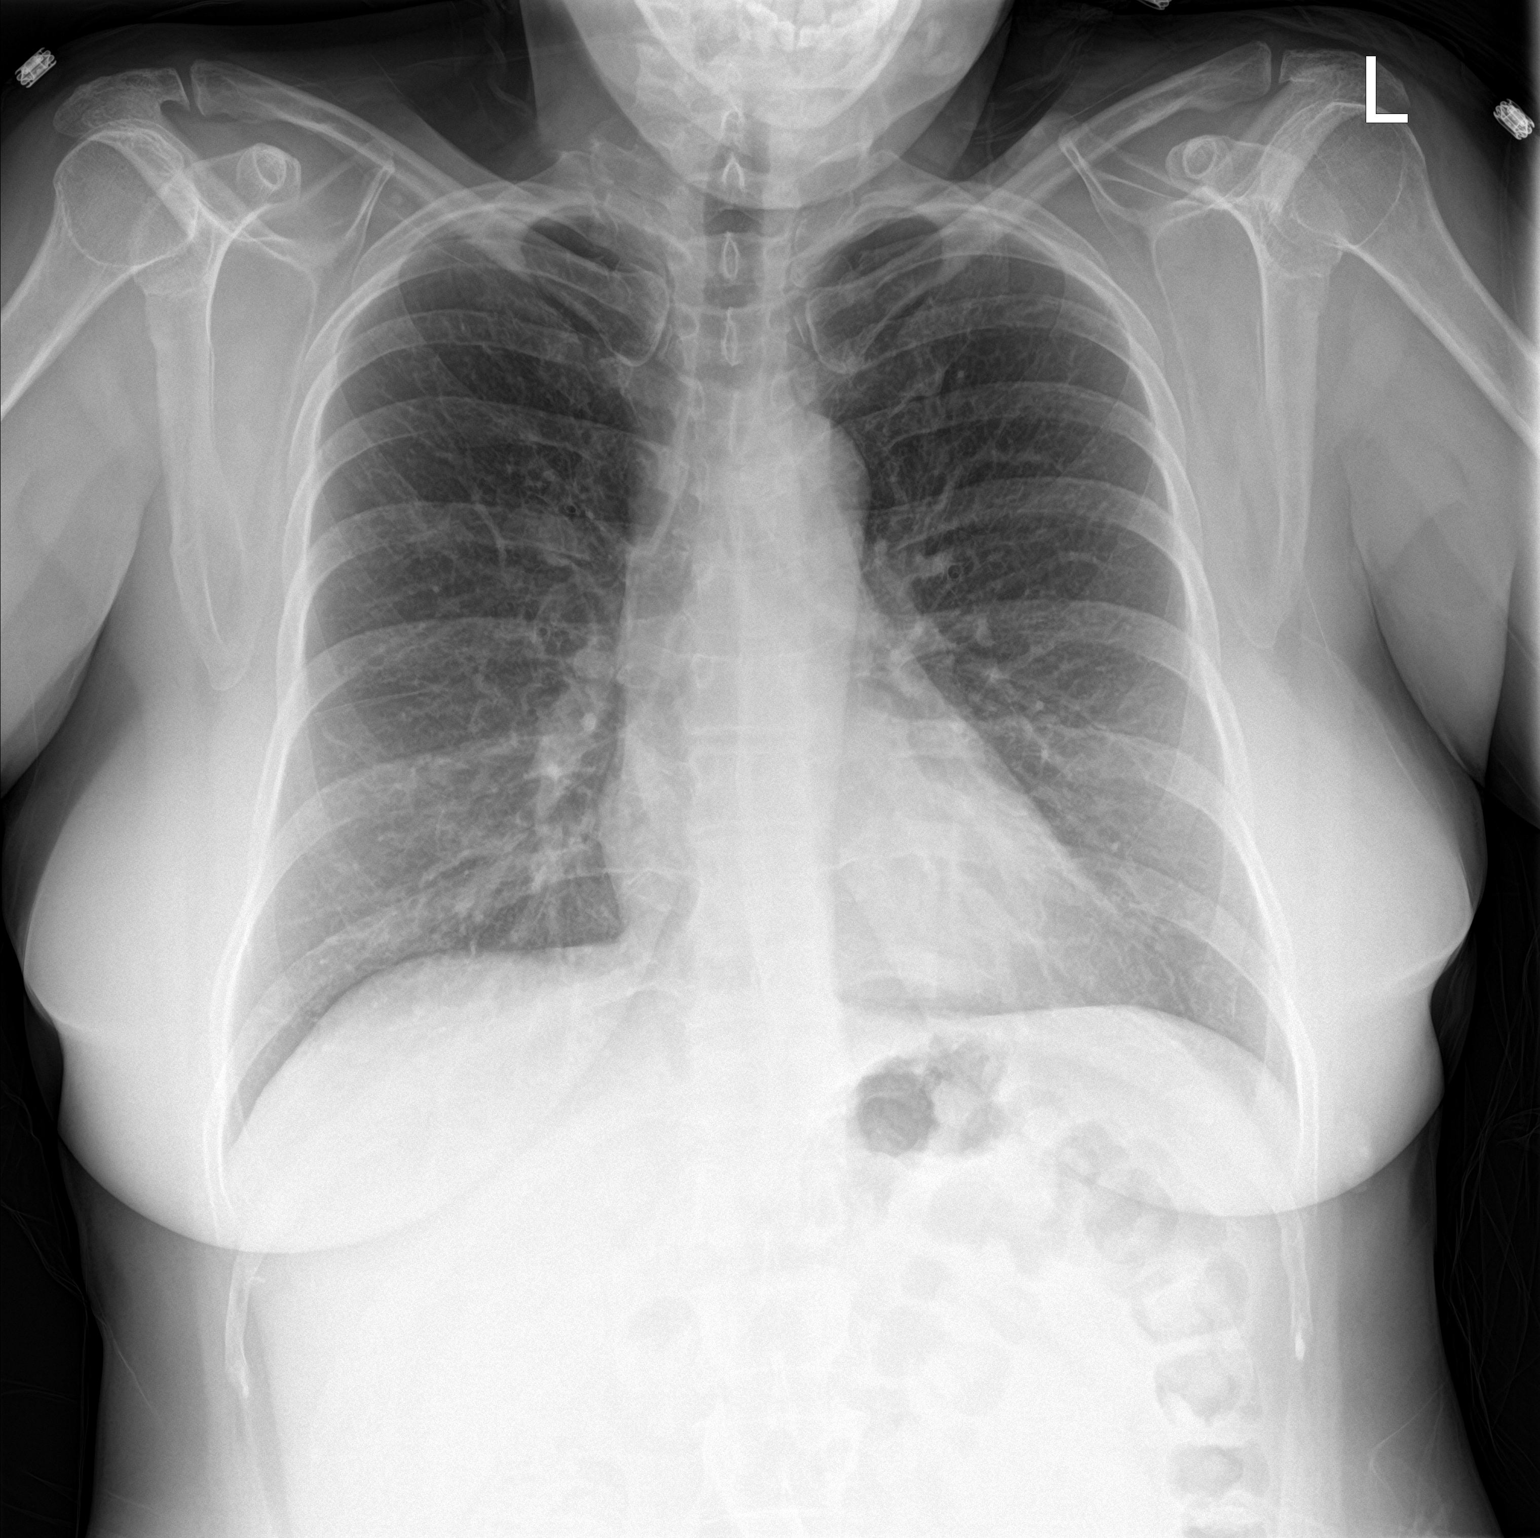

[chest lat]
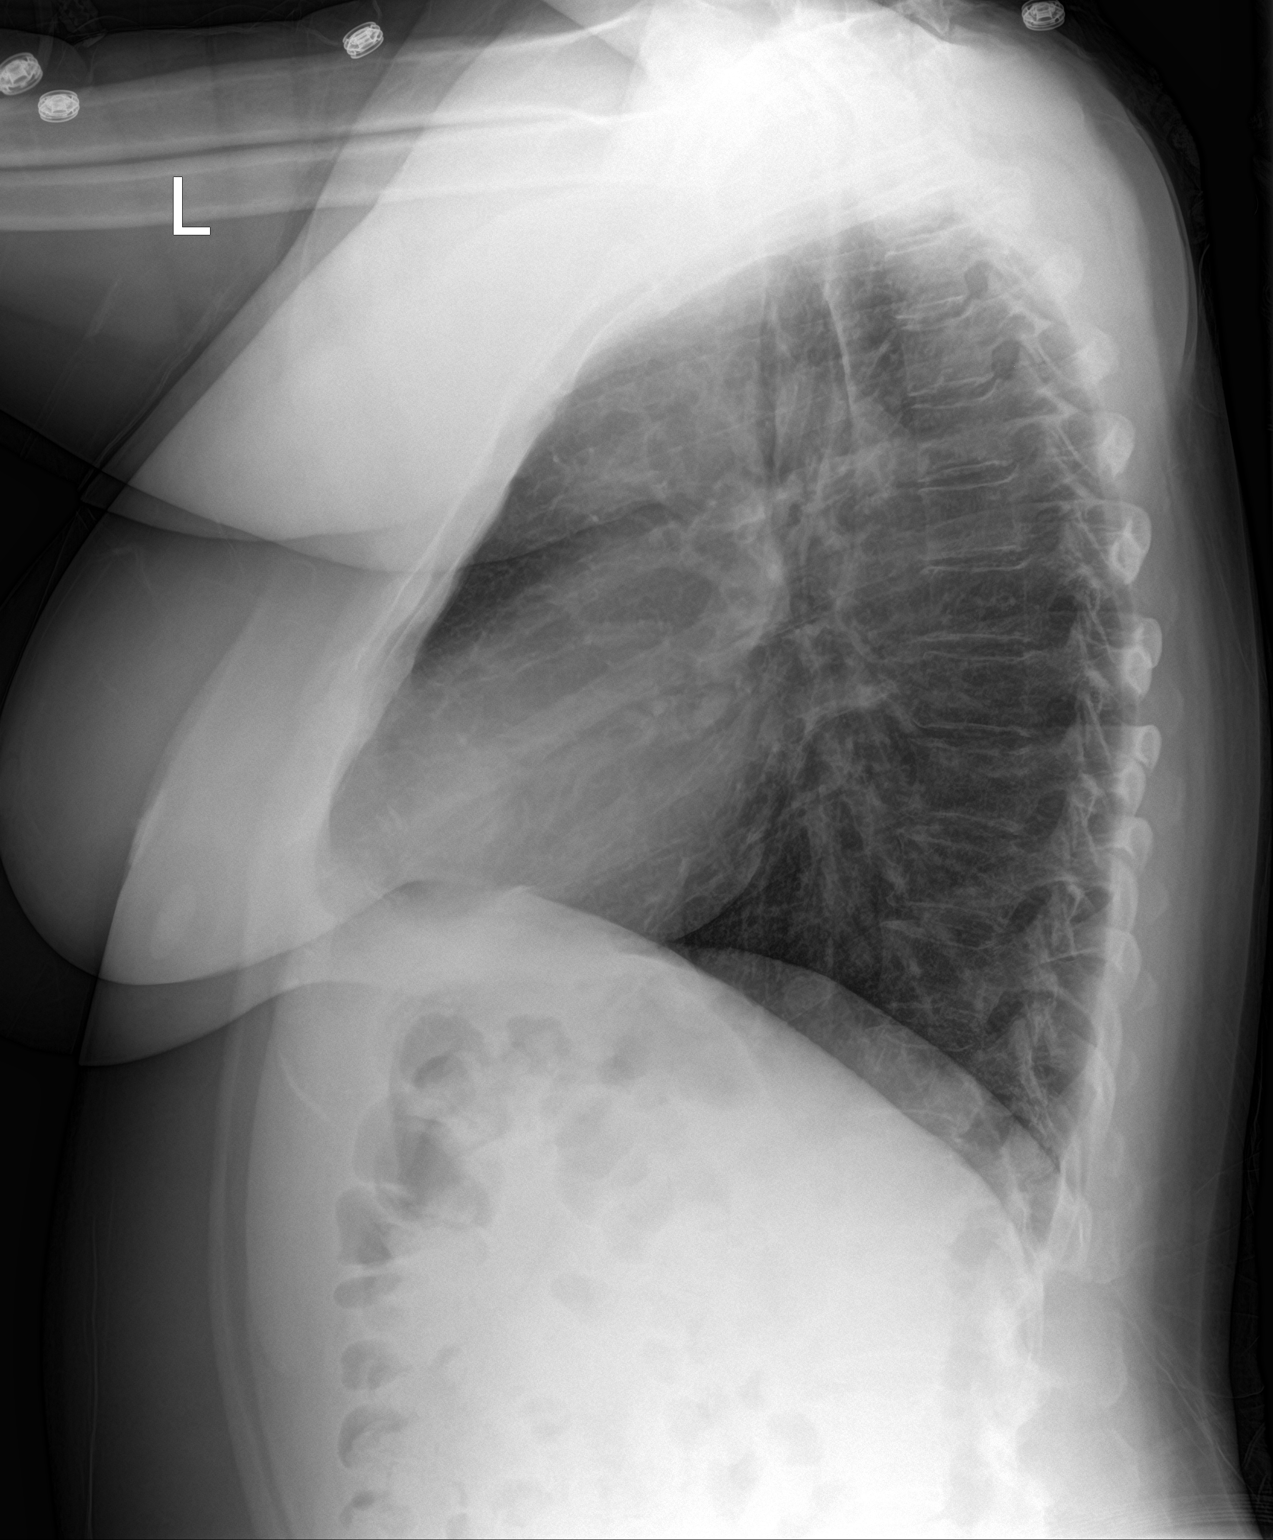

[2 of 2 positions shown; findings below may reference images not displayed]

FINDINGS: The heart size and mediastinal contours are within normal limits.
Both lungs are clear. No pneumothorax or pleural effusion is noted.
The visualized skeletal structures are unremarkable.
IMPRESSION: No active cardiopulmonary disease.

## 2015-07-20 MED ORDER — GUAIFENESIN ER 1200 MG PO TB12: 1.0000 | ORAL_TABLET | Freq: Two times a day (BID) | ORAL | Status: DC

## 2015-07-20 MED ORDER — PREDNISONE 20 MG PO TABS
60.0000 mg | ORAL_TABLET | Freq: Once | ORAL | Status: AC
  Administered 2015-07-20: 60 mg via ORAL
  Filled 2015-07-20: qty 3

## 2015-07-20 MED ORDER — ALBUTEROL SULFATE HFA 108 (90 BASE) MCG/ACT IN AERS
2.0000 | INHALATION_SPRAY | Freq: Four times a day (QID) | RESPIRATORY_TRACT | Status: DC
  Administered 2015-07-20: 2 via RESPIRATORY_TRACT
  Filled 2015-07-20: qty 6.7

## 2015-07-20 MED ORDER — PREDNISONE 50 MG PO TABS: 50.0000 mg | ORAL_TABLET | Freq: Every day | ORAL | Status: DC

## 2015-07-20 MED ORDER — AEROCHAMBER PLUS FLO-VU MEDIUM MISC
1.0000 | Freq: Once | Status: DC
  Filled 2015-07-20: qty 1

## 2015-07-20 MED ORDER — SODIUM CHLORIDE 0.9 % IV BOLUS (SEPSIS)
1000.0000 mL | Freq: Once | INTRAVENOUS | Status: AC
  Administered 2015-07-20: 1000 mL via INTRAVENOUS

## 2015-07-20 MED ORDER — PROMETHAZINE-DM 6.25-15 MG/5ML PO SYRP: 5.0000 mL | ORAL_SOLUTION | Freq: Four times a day (QID) | ORAL | Status: DC | PRN

## 2015-07-20 NOTE — ED Provider Notes (Signed)
CSN: [id number]     Arrival date & time 07/20/15  1191 History   First MD Initiated Contact with Patient 07/20/15 0715     Chief Complaint  Patient presents with  . Generalized Body Aches     (Consider location/radiation/quality/duration/timing/severity/associated sxs/prior Treatment) HPI Patient presents to the emergency department with cough, nasal congestion, body aches for the last week.  The patient states that she has had some chills as well.  She states she has taken Tylenol over-the-counter for her symptoms.  She denies having an of the medications.  The patient states nothing seems make her condition better or worse.  She does state that her symptoms seem worse at nighttime.  Patient denies chest pain, shortness of breath, nausea, vomiting, headache, blurred vision, back pain, neck pain or throat, dysuria, incontinence, rash, near syncope or syncope.  The patient states that she is a smoker Past Medical History  Diagnosis Date  . Migraines    History reviewed. No pertinent past surgical history. History reviewed. No pertinent family history. Social History  Substance Use Topics  . Smoking status: Current Every Day Smoker -- 0.50 packs/day    Types: Cigarettes  . Smokeless tobacco: Never Used  . Alcohol Use: No     Comment: occ   OB History    No data available     Review of Systems  All other systems negative except as documented in the HPI. All pertinent positives and negatives as reviewed in the HPI.  Allergies  Percocet  Home Medications   Prior to Admission medications   Medication Sig Start Date End Date Taking? Authorizing Provider  acetaminophen (TYLENOL) 325 MG tablet Take 650 mg by mouth every 6 (six) hours as needed for fever.   Yes Historical Provider, MD  clonazePAM (KLONOPIN) 0.5 MG tablet Take 0.5 mg by mouth 2 (two) times daily as needed for anxiety.   Yes Historical Provider, MD  butalbital-acetaminophen-caffeine (FIORICET) 50-325-40 MG tablet Take  1 tablet by mouth every 6 (six) hours as needed for headache. Patient not taking: Reported on 07/20/2015 05/24/15   Joni Reining Pisciotta, PA-C  ciprofloxacin (CIPRO) 500 MG tablet Take 1 tablet (500 mg total) by mouth 2 (two) times daily. Patient not taking: Reported on 05/24/2015 05/03/15   Fayrene Helper, PA-C  diazepam (VALIUM) 5 MG tablet Take 1 tablet (5 mg total) by mouth 2 (two) times daily. Patient not taking: Reported on 01/11/2015 12/31/14   Oswaldo Conroy, PA-C  gabapentin (NEURONTIN) 100 MG capsule Take 1 pill on day one Take a pill with breakfast and lunch on day two Take a pill with breakfast lunch and dinner on day three and then continue to take 3 times daily Patient not taking: Reported on 08/02/2014 05/13/14   Terri Piedra, PA-C  HYDROcodone-acetaminophen (NORCO/VICODIN) 5-325 MG tablet Take 1 tablet by mouth every 4 (four) hours as needed. Patient not taking: Reported on 05/24/2015 05/06/15   Jerelyn Scott, MD  hydrocortisone (ANUSOL-HC) 2.5 % rectal cream Apply rectally 2 times daily for 7 days max. Patient not taking: Reported on 05/03/2015 01/11/15   Fayrene Helper, PA-C  meloxicam (MOBIC) 7.5 MG tablet Take 2 tablets (15 mg total) by mouth daily. Patient not taking: Reported on 01/11/2015 12/29/14   Antony Madura, PA-C  methocarbamol (ROBAXIN) 500 MG tablet Take 1 tablet (500 mg total) by mouth 2 (two) times daily. Patient not taking: Reported on 01/11/2015 12/29/14   Antony Madura, PA-C  predniSONE (DELTASONE) 20 MG tablet 2 tabs po daily x 5  days Patient not taking: Reported on 01/11/2015 12/31/14   Oswaldo ConroyVictoria Creech, PA-C  psyllium (METAMUCIL SMOOTH TEXTURE) 28 % packet Take 1 packet by mouth 2 (two) times daily. Patient not taking: Reported on 05/03/2015 01/11/15   Fayrene HelperBowie Tran, PA-C   BP 92/67 mmHg  Pulse 75  Temp(Src) 98.4 F (36.9 C) (Oral)  Resp 23  Ht 5\' 2"  (1.575 m)  Wt 79.833 kg  BMI 32.18 kg/m2  SpO2 95% Physical Exam  Constitutional: She is oriented to person, place, and  time. She appears well-developed and well-nourished. No distress.  HENT:  Head: Normocephalic and atraumatic.  Mouth/Throat: Oropharynx is clear and moist.  Eyes: Pupils are equal, round, and reactive to light.  Neck: Normal range of motion. Neck supple.  Cardiovascular: Normal rate, regular rhythm and normal heart sounds.  Exam reveals no gallop and no friction rub.   No murmur heard. Pulmonary/Chest: Effort normal and breath sounds normal. No respiratory distress. She has no wheezes.  Musculoskeletal: She exhibits no edema.  Neurological: She is alert and oriented to person, place, and time. She exhibits normal muscle tone. Coordination normal.  Skin: Skin is warm and dry. No rash noted. No erythema.  Nursing note and vitals reviewed.   ED Course  Procedures (including critical care time) Labs Review Labs Reviewed - No data to display  Imaging Review Dg Chest 2 View  07/20/2015  CLINICAL DATA:  Cough. EXAM: CHEST  2 VIEW COMPARISON:  September 13, 2004. FINDINGS: The heart size and mediastinal contours are within normal limits. Both lungs are clear. No pneumothorax or pleural effusion is noted. The visualized skeletal structures are unremarkable. IMPRESSION: No active cardiopulmonary disease. Electronically Signed   By: Lupita RaiderJames  Green Jr, M.D.   On: 07/20/2015 08:34   I have personally reviewed and evaluated these images and lab results as part of my medical decision-making.  Patient be treated for bronchitis.  Told to return here as needed.  Patient agrees the plan and all questions were answered.  I advised the patient to follow up with her primary care doctor.    Charlestine NightChristopher Temitope Griffing, PA-C 07/20/15 [id number]  Raeford RazorStephen Kohut, MD 07/26/15 [phone]

## 2015-07-20 NOTE — ED Notes (Signed)
Pt here from home with c/o gen body aches ,cough and fever , for one week pt has been taking otc meds ,

## 2015-07-20 NOTE — Discharge Instructions (Signed)
Return here as needed.  Follow-up with a primary care Dr. increase your fluid intake and rest as much as possible.  Chest x-ray did not show any abnormality

## 2015-08-13 ENCOUNTER — Encounter (HOSPITAL_COMMUNITY): Payer: Self-pay | Admitting: Emergency Medicine

## 2015-08-13 ENCOUNTER — Emergency Department (HOSPITAL_COMMUNITY): Payer: Self-pay

## 2015-08-13 ENCOUNTER — Emergency Department (HOSPITAL_COMMUNITY)
Admission: EM | Admit: 2015-08-13 | Discharge: 2015-08-13 | Disposition: A | Discharge status: Home / Self Care | Payer: Self-pay | Attending: Emergency Medicine | Admitting: Emergency Medicine

## 2015-08-13 DIAGNOSIS — M545 Low back pain, unspecified: Secondary | ICD-10-CM

## 2015-08-13 DIAGNOSIS — R1031 Right lower quadrant pain: Secondary | ICD-10-CM | POA: Insufficient documentation

## 2015-08-13 DIAGNOSIS — F17210 Nicotine dependence, cigarettes, uncomplicated: Secondary | ICD-10-CM | POA: Insufficient documentation

## 2015-08-13 DIAGNOSIS — R1032 Left lower quadrant pain: Secondary | ICD-10-CM | POA: Insufficient documentation

## 2015-08-13 DIAGNOSIS — Z79899 Other long term (current) drug therapy: Secondary | ICD-10-CM | POA: Insufficient documentation

## 2015-08-13 DIAGNOSIS — G43909 Migraine, unspecified, not intractable, without status migrainosus: Secondary | ICD-10-CM | POA: Insufficient documentation

## 2015-08-13 LAB — COMPREHENSIVE METABOLIC PANEL
ALBUMIN: 3.7 g/dL (ref 3.5–5.0)
ALK PHOS: 62 U/L (ref 38–126)
ALT: 19 U/L (ref 14–54)
AST: 27 U/L (ref 15–41)
Anion gap: 11 (ref 5–15)
BILIRUBIN TOTAL: 0.4 mg/dL (ref 0.3–1.2)
BUN: 15 mg/dL (ref 6–20)
CALCIUM: 9.2 mg/dL (ref 8.9–10.3)
CO2: 24 mmol/L (ref 22–32)
CREATININE: 0.73 mg/dL (ref 0.44–1.00)
Chloride: 107 mmol/L (ref 101–111)
GFR calc Af Amer: >60 mL/min (ref >60)
GLUCOSE: 92 mg/dL (ref 65–99)
POTASSIUM: 4.1 mmol/L (ref 3.5–5.1)
Sodium: 142 mmol/L (ref 135–145)
TOTAL PROTEIN: 6.5 g/dL (ref 6.5–8.1)

## 2015-08-13 LAB — URINALYSIS, ROUTINE W REFLEX MICROSCOPIC
Glucose, UA: NEGATIVE mg/dL
Hgb urine dipstick: NEGATIVE
KETONES UR: NEGATIVE mg/dL
LEUKOCYTES UA: NEGATIVE
NITRITE: NEGATIVE
PH: 5.5 (ref 5.0–8.0)
PROTEIN: NEGATIVE mg/dL
Specific Gravity, Urine: 1.043 — ABNORMAL HIGH (ref 1.005–1.030)

## 2015-08-13 LAB — CBC
HCT: 43.3 % (ref 36.0–46.0)
Hemoglobin: 14.7 g/dL (ref 12.0–15.0)
MCH: 29.5 pg (ref 26.0–34.0)
MCHC: 33.9 g/dL (ref 30.0–36.0)
MCV: 86.9 fL (ref 78.0–100.0)
PLATELETS: 269 10*3/uL (ref 150–400)
RBC: 4.98 MIL/uL (ref 3.87–5.11)
RDW: 13.4 % (ref 11.5–15.5)
WBC: 6.9 10*3/uL (ref 4.0–10.5)

## 2015-08-13 LAB — LIPASE, BLOOD: Lipase: 32 U/L (ref 11–51)

## 2015-08-13 IMAGING — CT CT ABD-PELV W/ CM
2 of 5 series · 10 of 46 positions shown, 11 images · IV contrast (Iodine)
Comparison: Abdominal pelvic CT [DATE] and [DATE].

CLINICAL DATA: Left lower quadrant abdominal pain with nausea for 4
days.

EXAM:
CT ABDOMEN AND PELVIS WITH CONTRAST
TECHNIQUE: Multidetector CT imaging of the abdomen and pelvis was performed
using the standard protocol following bolus administration of
intravenous contrast.
CONTRAST:  100mL OMNIPAQUE IOHEXOL 300 MG/ML  SOLN

[Series 201: routine, idose (2) · axial · 0.69mm/px · z∈[-395,-50]mm · 7 of 89 slices shown, 8 images]
[im 10/89  soft-tissue]
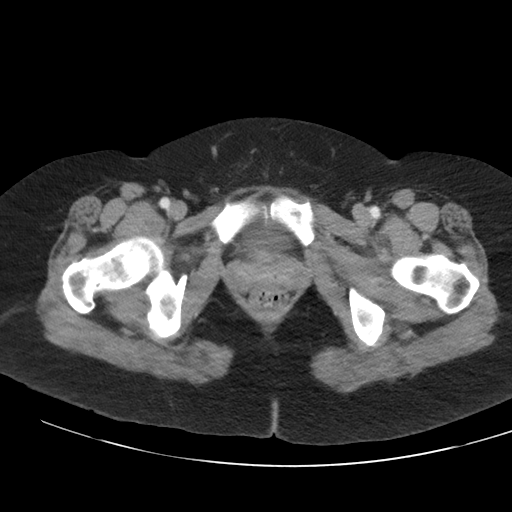
[im 10/89  bone]
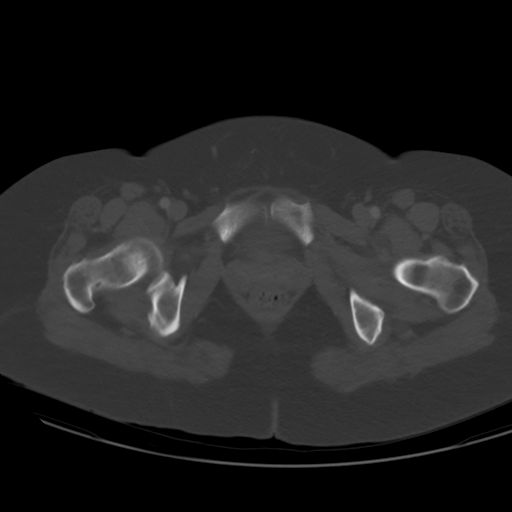
[im 19/89  soft-tissue]
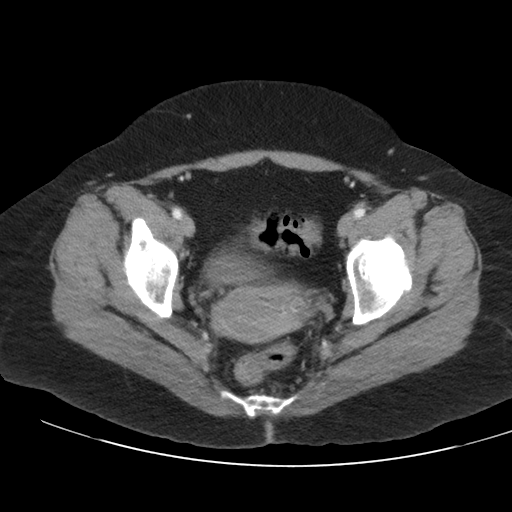
[im 33/89  soft-tissue]
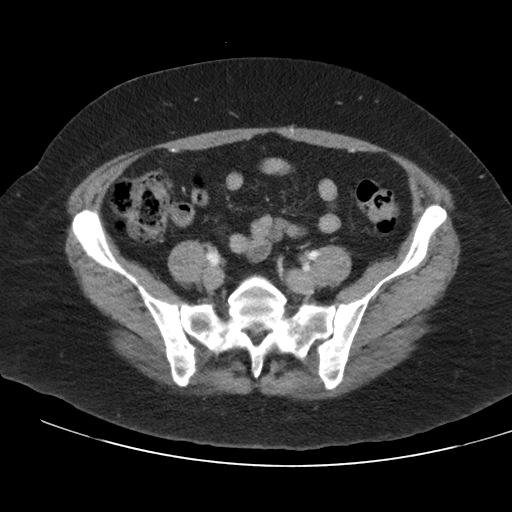
[im 47/89  soft-tissue]
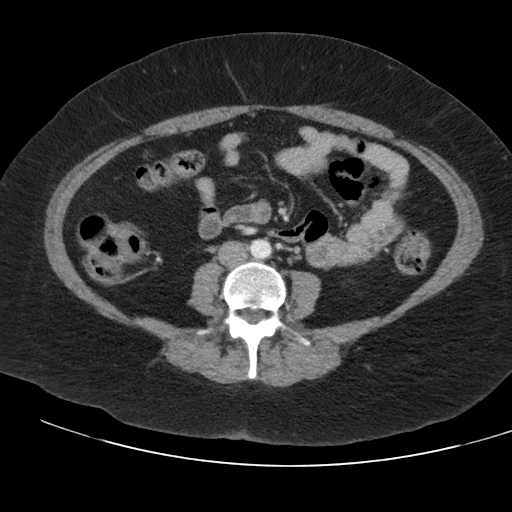
[im 56/89  soft-tissue]
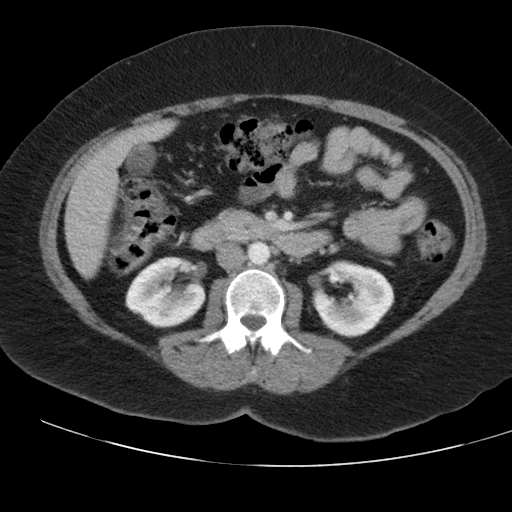
[im 70/89  soft-tissue]
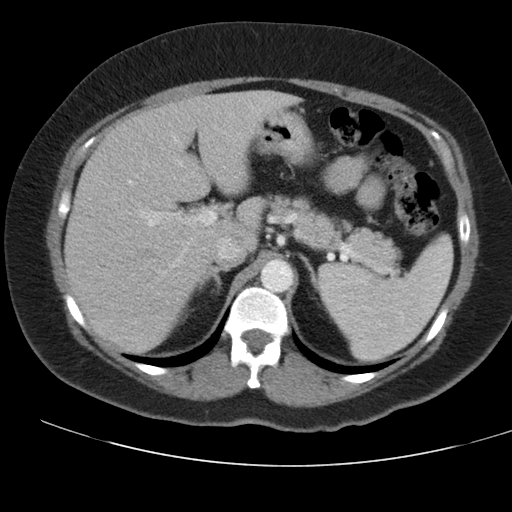
[im 79/89  soft-tissue]
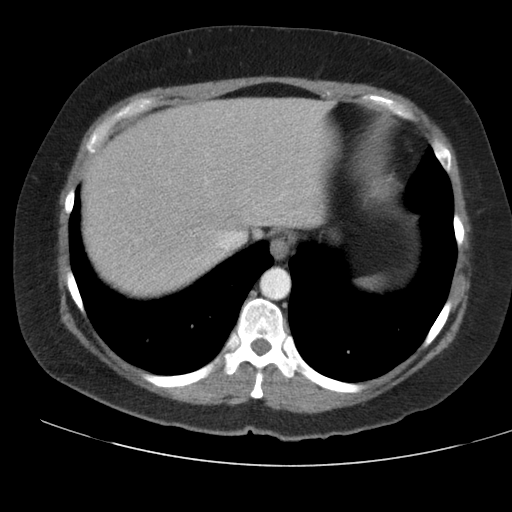

[Series 203: coronals, idose (2) · coronal · 0.45mm/px · 3 of 128 slices shown]
[im 43/128  soft-tissue]
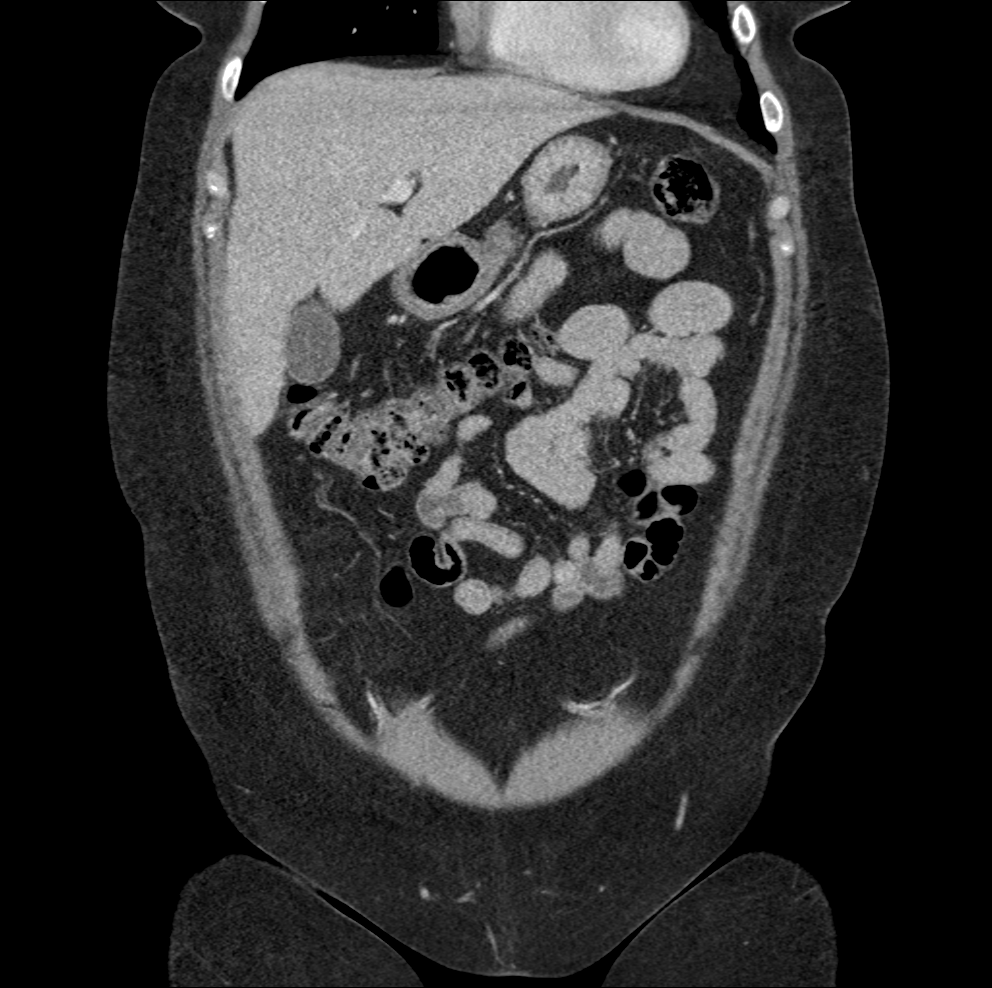
[im 57/128  soft-tissue]
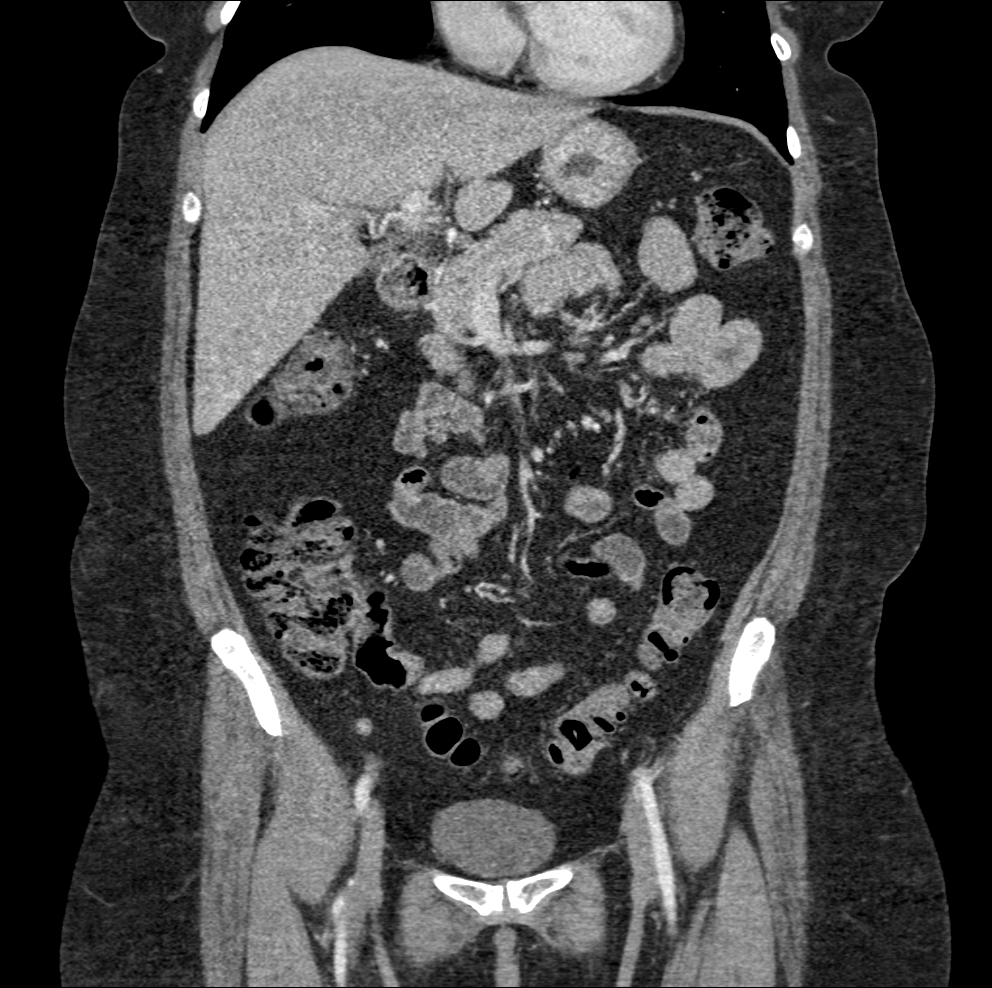
[im 71/128  soft-tissue]
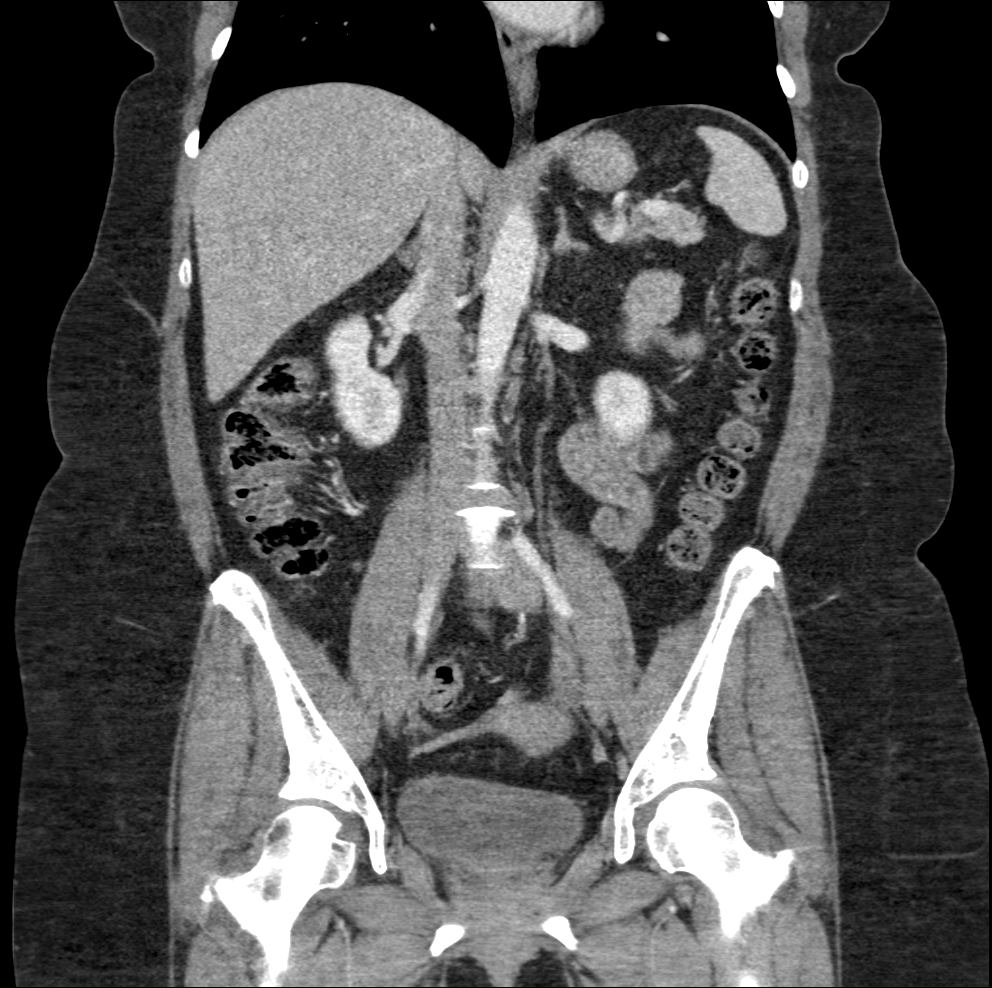

[10 of 46 positions shown; findings below may reference images not displayed]

FINDINGS: Lower chest: Clear lung bases. No significant pleural or pericardial
effusion.

Hepatobiliary: The liver is normal in density without focal
abnormality. No evidence of gallstones, gallbladder wall thickening
or biliary dilatation.

Pancreas: Unremarkable. No pancreatic ductal dilatation or
surrounding inflammatory changes.

Spleen: Normal in size without focal abnormality.

Adrenals/Urinary Tract: Both adrenal glands appear normal. The
kidneys appear normal without evidence of urinary tract calculus,
suspicious lesion or hydronephrosis. No bladder abnormalities are
seen.

Stomach/Bowel: No evidence of bowel wall thickening, distention or
surrounding inflammatory change. Mild sigmoid diverticulosis without
evidence of acute inflammation. The appendix appears normal.

Vascular/Lymphatic: There are no enlarged abdominal or pelvic lymph
nodes. No significant vascular findings are present.

Reproductive: Unremarkable.

Other: No evidence of abdominal wall mass or hernia.

Musculoskeletal: No acute or significant osseous findings. Mild
facet disease in the lower lumbar spine with stable mild foraminal
narrowing at L4-5.
IMPRESSION: 1. Sigmoid diverticulosis without evidence of acute inflammation.
2. No acute abdominal pelvic findings demonstrated. The appendix
appears normal.

## 2015-08-13 MED ORDER — ONDANSETRON 4 MG PO TBDP: 4.0000 mg | ORAL_TABLET | Freq: Once | ORAL | Status: DC | PRN

## 2015-08-13 MED ORDER — CYCLOBENZAPRINE HCL 10 MG PO TABS: 10.0000 mg | ORAL_TABLET | Freq: Two times a day (BID) | ORAL | Status: DC | PRN

## 2015-08-13 MED ORDER — MORPHINE SULFATE (PF) 4 MG/ML IV SOLN
4.0000 mg | Freq: Once | INTRAVENOUS | Status: AC
  Administered 2015-08-13: 4 mg via INTRAVENOUS
  Filled 2015-08-13: qty 1

## 2015-08-13 MED ORDER — ONDANSETRON HCL 4 MG/2ML IJ SOLN
4.0000 mg | Freq: Once | INTRAMUSCULAR | Status: AC
  Administered 2015-08-13: 4 mg via INTRAVENOUS
  Filled 2015-08-13: qty 2

## 2015-08-13 MED ORDER — SODIUM CHLORIDE 0.9 % IV BOLUS (SEPSIS)
1000.0000 mL | Freq: Once | INTRAVENOUS | Status: AC
  Administered 2015-08-13: 1000 mL via INTRAVENOUS

## 2015-08-13 MED ORDER — IOHEXOL 300 MG/ML  SOLN
80.0000 mL | Freq: Once | INTRAMUSCULAR | Status: AC | PRN
  Administered 2015-08-13: 100 mL via INTRAVENOUS

## 2015-08-13 MED ORDER — NAPROXEN 500 MG PO TABS: 500.0000 mg | ORAL_TABLET | Freq: Two times a day (BID) | ORAL | Status: DC

## 2015-08-13 NOTE — Progress Notes (Signed)
Spoke to patient regarding primary care resources and the [facility] orange card. Patient states she is in the process of obtaining the documents needed for the orange card. Orange card application provided and explained. Resource guide and my contact information also provided for any future questions or concerns. No other [facility]ist needs identified at this time.  Buddy Duty [facility] Eligibility Specialist [id number]  [phone]

## 2015-08-13 NOTE — ED Notes (Signed)
Pt off unit with CT 

## 2015-08-13 NOTE — Discharge Instructions (Signed)
Back Exercises °The following exercises strengthen the muscles that help to support the back. They also help to keep the lower back flexible. Doing these exercises can help to prevent back pain or lessen existing pain. °If you have back pain or discomfort, try doing these exercises 2-3 times each day or as told by your health care provider. When the pain goes away, do them once each day, but increase the number of times that you repeat the steps for each exercise (do more repetitions). If you do not have back pain or discomfort, do these exercises once each day or as told by your health care provider. °EXERCISES °Single Knee to Chest °Repeat these steps 3-5 times for each leg: °· Lie on your back on a firm bed or the floor with your legs extended. °· Bring one knee to your chest. Your other leg should stay extended and in contact with the floor. °· Hold your knee in place by grabbing your knee or thigh. °· Pull on your knee until you feel a gentle stretch in your lower back. °· Hold the stretch for 10-30 seconds. °· Slowly release and straighten your leg. °Pelvic Tilt °Repeat these steps 5-10 times: °· Lie on your back on a firm bed or the floor with your legs extended. °· Bend your knees so they are pointing toward the ceiling and your feet are flat on the floor. °· Tighten your lower abdominal muscles to press your lower back against the floor. This motion will tilt your pelvis so your tailbone points up toward the ceiling instead of pointing to your feet or the floor. °· With gentle tension and even breathing, hold this position for 5-10 seconds. °Cat-Cow °Repeat these steps until your lower back becomes more flexible: °· Get into a hands-and-knees position on a firm surface. Keep your hands under your shoulders, and keep your knees under your hips. You may place padding under your knees for comfort. °· Let your head hang down, and point your tailbone toward the floor so your lower back becomes rounded like the  back of a cat. °· Hold this position for 5 seconds. °· Slowly lift your head and point your tailbone up toward the ceiling so your back forms a sagging arch like the back of a cow. °· Hold this position for 5 seconds. °Press-Ups °Repeat these steps 5-10 times: °· Lie on your abdomen (face-down) on the floor. °· Place your palms near your head, about shoulder-width apart. °· While you keep your back as relaxed as possible and keep your hips on the floor, slowly straighten your arms to raise the top half of your body and lift your shoulders. Do not use your back muscles to raise your upper torso. You may adjust the placement of your hands to make yourself more comfortable. °· Hold this position for 5 seconds while you keep your back relaxed. °· Slowly return to lying flat on the floor. °Bridges °Repeat these steps 10 times: °1. Lie on your back on a firm surface. °2. Bend your knees so they are pointing toward the ceiling and your feet are flat on the floor. °3. Tighten your buttocks muscles and lift your buttocks off of the floor until your waist is at almost the same height as your knees. You should feel the muscles working in your buttocks and the back of your thighs. If you do not feel these muscles, slide your feet 1-2 inches farther away from your buttocks. °4. Hold this position for 3-5   seconds. °5. Slowly lower your hips to the starting position, and allow your buttocks muscles to relax completely. °If this exercise is too easy, try doing it with your arms crossed over your chest. °Abdominal Crunches °Repeat these steps 5-10 times: °1. Lie on your back on a firm bed or the floor with your legs extended. °2. Bend your knees so they are pointing toward the ceiling and your feet are flat on the floor. °3. Cross your arms over your chest. °4. Tip your chin slightly toward your chest without bending your neck. °5. Tighten your abdominal muscles and slowly raise your trunk (torso) high enough to lift your shoulder  blades a tiny bit off of the floor. Avoid raising your torso higher than that, because it can put too much stress on your low back and it does not help to strengthen your abdominal muscles. °6. Slowly return to your starting position. °Back Lifts °Repeat these steps 5-10 times: °1. Lie on your abdomen (face-down) with your arms at your sides, and rest your forehead on the floor. °2. Tighten the muscles in your legs and your buttocks. °3. Slowly lift your chest off of the floor while you keep your hips pressed to the floor. Keep the back of your head in line with the curve in your back. Your eyes should be looking at the floor. °4. Hold this position for 3-5 seconds. °5. Slowly return to your starting position. °SEEK MEDICAL CARE IF: °· Your back pain or discomfort gets much worse when you do an exercise. °· Your back pain or discomfort does not lessen within 2 hours after you exercise. °If you have any of these problems, stop doing these exercises right away. Do not do them again unless your health care provider says that you can. °SEEK IMMEDIATE MEDICAL CARE IF: °· You develop sudden, severe back pain. If this happens, stop doing the exercises right away. Do not do them again unless your health care provider says that you can. °  °This information is not intended to replace advice given to you by your health care provider. Make sure you discuss any questions you have with your health care provider. °  °Document Released: 08/13/2004 Document Revised: 03/27/2015 Document Reviewed: 08/30/2014 °Elsevier Interactive Patient Education ©2016 [employer] ° °Back Pain, Adult °Back pain is very common in adults. The cause of back pain is rarely dangerous and the pain often gets better over time. The cause of your back pain may not be known. Some common causes of back pain include: °· Strain of the muscles or ligaments supporting the spine. °· Wear and tear (degeneration) of the spinal disks. °· Arthritis. °· Direct injury  to the back. °For many people, back pain may return. Since back pain is rarely dangerous, most people can learn to manage this condition on their own. °HOME CARE INSTRUCTIONS °Watch your back pain for any changes. The following actions may help to lessen any discomfort you are feeling: °· Remain active. It is stressful on your back to sit or stand in one place for long periods of time. Do not sit, drive, or stand in one place for more than 30 minutes at a time. Take short walks on even surfaces as soon as you are able. Try to increase the length of time you walk each day. °· Exercise regularly as directed by your health care provider. Exercise helps your back heal faster. It also helps avoid future injury by keeping your muscles strong and flexible. °· Do not stay in   bed. Resting more than 1-2 days can delay your recovery. °· Pay attention to your body when you bend and lift. The most comfortable positions are those that put less stress on your recovering back. Always use proper lifting techniques, including: °¨ Bending your knees. °¨ Keeping the load close to your body. °¨ Avoiding twisting. °· Find a comfortable position to sleep. Use a firm mattress and lie on your side with your knees slightly bent. If you lie on your back, put a pillow under your knees. °· Avoid feeling anxious or stressed. Stress increases muscle tension and can worsen back pain. It is important to recognize when you are anxious or stressed and learn ways to manage it, such as with exercise. °· Take medicines only as directed by your health care provider. Over-the-counter medicines to reduce pain and inflammation are often the most helpful. Your health care provider may prescribe muscle relaxant drugs. These medicines help dull your pain so you can more quickly return to your normal activities and healthy exercise. °· Apply ice to the injured area: °¨ Put ice in a plastic bag. °¨ Place a towel between your skin and the bag. °¨ Leave the ice on  for 20 minutes, 2-3 times a day for the first 2-3 days. After that, ice and heat may be alternated to reduce pain and spasms. °· Maintain a healthy weight. Excess weight puts extra stress on your back and makes it difficult to maintain good posture. °SEEK MEDICAL CARE IF: °· You have pain that is not relieved with rest or medicine. °· You have increasing pain going down into the legs or buttocks. °· You have pain that does not improve in one week. °· You have night pain. °· You lose weight. °· You have a fever or chills. °SEEK IMMEDIATE MEDICAL CARE IF:  °· You develop new bowel or bladder control problems. °· You have unusual weakness or numbness in your arms or legs. °· You develop nausea or vomiting. °· You develop abdominal pain. °· You feel faint. °  °This information is not intended to replace advice given to you by your health care provider. Make sure you discuss any questions you have with your health care provider. °  °Document Released: 07/06/2005 Document Revised: 07/27/2014 Document Reviewed: 11/07/2013 °Elsevier Interactive Patient Education ©2016 Elsevier Inc[employer] ° °

## 2015-08-13 NOTE — ED Notes (Signed)
C/o abd pain and left sided flank pain since fri, nausea with no vomiting, no diarrhea, dysuria, A/OX4, ambulatory and in NAD

## 2015-08-13 NOTE — ED Provider Notes (Signed)
CSN: [id number]     Arrival date & time 08/13/15  7846 History   First MD Initiated Contact with Patient 08/13/15 [phone]     Chief Complaint  Patient presents with  . Abdominal Pain  . Flank Pain     (Consider location/radiation/quality/duration/timing/severity/associated sxs/prior Treatment) Patient is a 50 y.o. female presenting with abdominal pain and flank pain.  Abdominal Pain Pain location:  L flank Pain quality: pressure   Pain radiates to:  Does not radiate Pain severity:  Severe Onset quality:  Sudden Duration:  4 days Timing:  Constant Chronicity:  New Relieved by:  Nothing Worsened by:  Nothing tried Ineffective treatments:  Acetaminophen and NSAIDs Associated symptoms: hematuria (dark urine, not neccesarily blood) and nausea   Associated symptoms: no anorexia, no chest pain, no constipation, no cough, no diarrhea, no dysuria, no fever, no shortness of breath, no sore throat, no vaginal bleeding, no vaginal discharge and no vomiting   Flank Pain Associated symptoms include abdominal pain. Pertinent negatives include no chest pain, no headaches and no shortness of breath.    Past Medical History  Diagnosis Date  . Migraines    History reviewed. No pertinent past surgical history. No family history on file. Social History  Substance Use Topics  . Smoking status: Current Every Day Smoker -- 0.50 packs/day    Types: Cigarettes  . Smokeless tobacco: Never Used  . Alcohol Use: No     Comment: occ   OB History    No data available     Review of Systems  Constitutional: Negative for fever.  HENT: Negative for sore throat.   Eyes: Negative for visual disturbance.  Respiratory: Negative for cough and shortness of breath.   Cardiovascular: Negative for chest pain.  Gastrointestinal: Positive for nausea and abdominal pain. Negative for vomiting, diarrhea, constipation and anorexia.  Genitourinary: Positive for frequency, hematuria (dark urine, not neccesarily  blood) and flank pain. Negative for dysuria, vaginal bleeding, vaginal discharge and difficulty urinating.  Musculoskeletal: Negative for back pain and neck pain.  Skin: Negative for rash.  Neurological: Negative for syncope and headaches.      Allergies  Other and Percocet  Home Medications   Prior to Admission medications   Medication Sig Start Date End Date Taking? Authorizing Provider  acetaminophen (TYLENOL) 325 MG tablet Take 650 mg by mouth every 6 (six) hours as needed for fever.   Yes Historical Provider, MD  ALPRAZolam Prudy Feeler) 1 MG tablet Take 1 mg by mouth 2 (two) times daily.   Yes Historical Provider, MD  ibuprofen (ADVIL,MOTRIN) 200 MG tablet Take 200 mg by mouth every 6 (six) hours as needed for moderate pain.   Yes Historical Provider, MD  butalbital-acetaminophen-caffeine (FIORICET) 50-325-40 MG tablet Take 1 tablet by mouth every 6 (six) hours as needed for headache. Patient not taking: Reported on 07/20/2015 05/24/15   Joni Reining Pisciotta, PA-C  ciprofloxacin (CIPRO) 500 MG tablet Take 1 tablet (500 mg total) by mouth 2 (two) times daily. Patient not taking: Reported on 05/24/2015 05/03/15   Fayrene Helper, PA-C  cyclobenzaprine (FLEXERIL) 10 MG tablet Take 1 tablet (10 mg total) by mouth 2 (two) times daily as needed for muscle spasms. 08/13/15   Alvira Monday, MD  diazepam (VALIUM) 5 MG tablet Take 1 tablet (5 mg total) by mouth 2 (two) times daily. Patient not taking: Reported on 01/11/2015 12/31/14   Oswaldo Conroy, PA-C  gabapentin (NEURONTIN) 100 MG capsule Take 1 pill on day one Take a pill with breakfast  and lunch on day two Take a pill with breakfast lunch and dinner on day three and then continue to take 3 times daily Patient not taking: Reported on 08/02/2014 05/13/14   Toni Amend Forcucci, PA-C  Guaifenesin 1200 MG TB12 Take 1 tablet (1,200 mg total) by mouth 2 (two) times daily. Patient not taking: Reported on 08/13/2015 07/20/15   Charlestine Night, PA-C   HYDROcodone-acetaminophen (NORCO/VICODIN) 5-325 MG tablet Take 1 tablet by mouth every 4 (four) hours as needed. Patient not taking: Reported on 05/24/2015 05/06/15   Jerelyn Scott, MD  hydrocortisone (ANUSOL-HC) 2.5 % rectal cream Apply rectally 2 times daily for 7 days max. Patient not taking: Reported on 05/03/2015 01/11/15   Fayrene Helper, PA-C  naproxen (NAPROSYN) 500 MG tablet Take 1 tablet (500 mg total) by mouth 2 (two) times daily with a meal. 08/13/15   Alvira Monday, MD   BP 102/72 mmHg  Pulse 70  Temp(Src) 98.5 F (36.9 C) (Oral)  Resp 20  Ht  (1.575 m)  Wt 173 lb (78.472 kg)  BMI 31.63 kg/m2  SpO2 99% Physical Exam  Constitutional: She is oriented to person, place, and time. She appears well-developed and well-nourished. No distress.  HENT:  Head: Normocephalic and atraumatic.  Eyes: Conjunctivae and EOM are normal.  Neck: Normal range of motion.  Cardiovascular: Normal rate, regular rhythm, normal heart sounds and intact distal pulses.  Exam reveals no gallop and no friction rub.   No murmur heard. Pulmonary/Chest: Effort normal and breath sounds normal. No respiratory distress. She has no wheezes. She has no rales.  Abdominal: Soft. She exhibits no distension. There is tenderness in the right lower quadrant and left lower quadrant. There is guarding, CVA tenderness and tenderness at McBurney's point.  Musculoskeletal: She exhibits no edema.       Lumbar back: She exhibits tenderness. She exhibits no bony tenderness.  Neurological: She is alert and oriented to person, place, and time.  Skin: Skin is warm and dry. No rash noted. She is not diaphoretic. No erythema.  Nursing note and vitals reviewed.   ED Course  Procedures (including critical care time) Labs Review Labs Reviewed  URINALYSIS, ROUTINE W REFLEX MICROSCOPIC (NOT AT [facility]) - Abnormal; Notable for the following:    APPearance CLOUDY (*)    Specific Gravity, Urine 1.043 (*)    Bilirubin Urine SMALL (*)     All other components within normal limits  LIPASE, BLOOD  COMPREHENSIVE METABOLIC PANEL  CBC    Imaging Review Ct Abdomen Pelvis W Contrast  08/13/2015  CLINICAL DATA:  Left lower quadrant abdominal pain with nausea for 4 days. EXAM: CT ABDOMEN AND PELVIS WITH CONTRAST TECHNIQUE: Multidetector CT imaging of the abdomen and pelvis was performed using the standard protocol following bolus administration of intravenous contrast. CONTRAST:  OMNIPAQUE IOHEXOL 300 MG/ML  SOLN COMPARISON:  Abdominal pelvic CT 05/06/2015 and 10/07/2014. FINDINGS: Lower chest: Clear lung bases. No significant pleural or pericardial effusion. Hepatobiliary: The liver is normal in density without focal abnormality. No evidence of gallstones, gallbladder wall thickening or biliary dilatation. Pancreas: Unremarkable. No pancreatic ductal dilatation or surrounding inflammatory changes. Spleen: Normal in size without focal abnormality. Adrenals/Urinary Tract: Both adrenal glands appear normal. The kidneys appear normal without evidence of urinary tract calculus, suspicious lesion or hydronephrosis. No bladder abnormalities are seen. Stomach/Bowel: No evidence of bowel wall thickening, distention or surrounding inflammatory change. Mild sigmoid diverticulosis without evidence of acute inflammation. The appendix appears normal. Vascular/Lymphatic: There are no enlarged  abdominal or pelvic lymph nodes. No significant vascular findings are present. Reproductive: Unremarkable. Other: No evidence of abdominal wall mass or hernia. Musculoskeletal: No acute or significant osseous findings. Mild facet disease in the lower lumbar spine with stable mild foraminal narrowing at L4-5. IMPRESSION: 1. Sigmoid diverticulosis without evidence of acute inflammation. 2. No acute abdominal pelvic findings demonstrated. The appendix appears normal. Electronically Signed   By: Carey Bullocks M.D.   On: 08/13/2015 12:38   I have personally reviewed  and evaluated these images and lab results as part of my medical decision-making.   EKG Interpretation None      MDM   Final diagnoses:  Left-sided low back pain without sciatica   50yo female with history of migraines presents with concern for left lower back and abdominal pain. Urinalysis without signs of UTI.  Pt with tenderness bilateral lower abdomen on exam and CT abd ordered to evaluate for appendicitis or diverticulitis and showed no acute findings. No sign of nephrolithiasis.  Pt without vaginal discharge, and doubt symptoms represent PID, TOA.   Patient with pain to left lower back and some history of the same.  Reports lifts heavy things at work, and pain likely muscular strain. Given rx for naproxen and flexeril. Patient discharged in stable condition with understanding of reasons to return.     Alvira Monday, MD 08/13/15 2255

## 2015-09-01 ENCOUNTER — Encounter (HOSPITAL_COMMUNITY): Payer: Self-pay | Admitting: Emergency Medicine

## 2015-09-01 ENCOUNTER — Emergency Department (HOSPITAL_COMMUNITY)
Admission: EM | Admit: 2015-09-01 | Discharge: 2015-09-01 | Disposition: A | Discharge status: Home / Self Care | Payer: Self-pay | Attending: Emergency Medicine | Admitting: Emergency Medicine

## 2015-09-01 DIAGNOSIS — R1110 Vomiting, unspecified: Secondary | ICD-10-CM | POA: Insufficient documentation

## 2015-09-01 DIAGNOSIS — L0201 Cutaneous abscess of face: Secondary | ICD-10-CM | POA: Insufficient documentation

## 2015-09-01 DIAGNOSIS — F17210 Nicotine dependence, cigarettes, uncomplicated: Secondary | ICD-10-CM | POA: Insufficient documentation

## 2015-09-01 DIAGNOSIS — R42 Dizziness and giddiness: Secondary | ICD-10-CM | POA: Insufficient documentation

## 2015-09-01 DIAGNOSIS — J029 Acute pharyngitis, unspecified: Secondary | ICD-10-CM | POA: Insufficient documentation

## 2015-09-01 LAB — CBC
HEMATOCRIT: 42.6 % (ref 36.0–46.0)
Hemoglobin: 14.1 g/dL (ref 12.0–15.0)
MCH: 28.4 pg (ref 26.0–34.0)
MCHC: 33.1 g/dL (ref 30.0–36.0)
MCV: 85.7 fL (ref 78.0–100.0)
Platelets: 323 10*3/uL (ref 150–400)
RBC: 4.97 MIL/uL (ref 3.87–5.11)
RDW: 13.4 % (ref 11.5–15.5)
WBC: 8.5 10*3/uL (ref 4.0–10.5)

## 2015-09-01 LAB — COMPREHENSIVE METABOLIC PANEL
ALBUMIN: 4.2 g/dL (ref 3.5–5.0)
ALT: 19 U/L (ref 14–54)
AST: 19 U/L (ref 15–41)
Alkaline Phosphatase: 58 U/L (ref 38–126)
Anion gap: 7 (ref 5–15)
BILIRUBIN TOTAL: 0.9 mg/dL (ref 0.3–1.2)
BUN: 17 mg/dL (ref 6–20)
CO2: 21 mmol/L — ABNORMAL LOW (ref 22–32)
Calcium: 9 mg/dL (ref 8.9–10.3)
Chloride: 109 mmol/L (ref 101–111)
Creatinine, Ser: 0.73 mg/dL (ref 0.44–1.00)
GFR calc Af Amer: >60 mL/min (ref >60)
GFR calc non Af Amer: >60 mL/min (ref >60)
GLUCOSE: 100 mg/dL — AB (ref 65–99)
POTASSIUM: 3.4 mmol/L — AB (ref 3.5–5.1)
Sodium: 137 mmol/L (ref 135–145)
TOTAL PROTEIN: 7.4 g/dL (ref 6.5–8.1)

## 2015-09-01 LAB — LIPASE, BLOOD: Lipase: 42 U/L (ref 11–51)

## 2015-09-01 MED ORDER — ONDANSETRON 4 MG PO TBDP
4.0000 mg | ORAL_TABLET | Freq: Once | ORAL | Status: AC | PRN
  Administered 2015-09-01: 4 mg via ORAL
  Filled 2015-09-01: qty 1

## 2015-09-01 NOTE — ED Notes (Signed)
Patient presents for sore throat, emesis x3 episodes today, lightheadedness x3 days. Also c/o of small abscess to left cheek x1 day. Denies fever/chills, denies diarrhea.

## 2015-09-01 NOTE — ED Notes (Signed)
Pt has been called 3 times in the last 30 min and pt has not been in the lobby; unable to locate pt; pt eloped from waiting room

## 2015-09-02 ENCOUNTER — Encounter (HOSPITAL_COMMUNITY): Payer: Self-pay | Admitting: *Deleted

## 2015-09-02 ENCOUNTER — Emergency Department (HOSPITAL_COMMUNITY)
Admission: EM | Admit: 2015-09-02 | Discharge: 2015-09-02 | Disposition: A | Discharge status: Home / Self Care | Payer: Self-pay | Attending: Emergency Medicine | Admitting: Emergency Medicine

## 2015-09-02 DIAGNOSIS — R1110 Vomiting, unspecified: Secondary | ICD-10-CM | POA: Insufficient documentation

## 2015-09-02 DIAGNOSIS — G43909 Migraine, unspecified, not intractable, without status migrainosus: Secondary | ICD-10-CM | POA: Insufficient documentation

## 2015-09-02 DIAGNOSIS — Z3202 Encounter for pregnancy test, result negative: Secondary | ICD-10-CM | POA: Insufficient documentation

## 2015-09-02 DIAGNOSIS — Z791 Long term (current) use of non-steroidal anti-inflammatories (NSAID): Secondary | ICD-10-CM | POA: Insufficient documentation

## 2015-09-02 DIAGNOSIS — F17210 Nicotine dependence, cigarettes, uncomplicated: Secondary | ICD-10-CM | POA: Insufficient documentation

## 2015-09-02 DIAGNOSIS — R197 Diarrhea, unspecified: Secondary | ICD-10-CM | POA: Insufficient documentation

## 2015-09-02 LAB — CBC
HCT: 43.9 % (ref 36.0–46.0)
Hemoglobin: 14.1 g/dL (ref 12.0–15.0)
MCH: 28.4 pg (ref 26.0–34.0)
MCHC: 32.1 g/dL (ref 30.0–36.0)
MCV: 88.3 fL (ref 78.0–100.0)
PLATELETS: 322 10*3/uL (ref 150–400)
RBC: 4.97 MIL/uL (ref 3.87–5.11)
RDW: 13.7 % (ref 11.5–15.5)
WBC: 8 10*3/uL (ref 4.0–10.5)

## 2015-09-02 LAB — COMPREHENSIVE METABOLIC PANEL
ALK PHOS: 59 U/L (ref 38–126)
ALT: 17 U/L (ref 14–54)
AST: 19 U/L (ref 15–41)
Albumin: 4 g/dL (ref 3.5–5.0)
Anion gap: 9 (ref 5–15)
BUN: 18 mg/dL (ref 6–20)
CALCIUM: 8.9 mg/dL (ref 8.9–10.3)
CHLORIDE: 108 mmol/L (ref 101–111)
CO2: 21 mmol/L — ABNORMAL LOW (ref 22–32)
CREATININE: 0.66 mg/dL (ref 0.44–1.00)
Glucose, Bld: 97 mg/dL (ref 65–99)
Potassium: 3.6 mmol/L (ref 3.5–5.1)
Sodium: 138 mmol/L (ref 135–145)
Total Bilirubin: 0.5 mg/dL (ref 0.3–1.2)
Total Protein: 7.1 g/dL (ref 6.5–8.1)

## 2015-09-02 LAB — URINALYSIS, ROUTINE W REFLEX MICROSCOPIC
GLUCOSE, UA: NEGATIVE mg/dL
HGB URINE DIPSTICK: NEGATIVE
KETONES UR: NEGATIVE mg/dL
LEUKOCYTES UA: NEGATIVE
Nitrite: NEGATIVE
PROTEIN: NEGATIVE mg/dL
Specific Gravity, Urine: 1.04 — ABNORMAL HIGH (ref 1.005–1.030)
pH: 6 (ref 5.0–8.0)

## 2015-09-02 LAB — LIPASE, BLOOD: LIPASE: 26 U/L (ref 11–51)

## 2015-09-02 LAB — POC URINE PREG, ED: Preg Test, Ur: NEGATIVE

## 2015-09-02 MED ORDER — METOCLOPRAMIDE HCL 5 MG/ML IJ SOLN
10.0000 mg | Freq: Once | INTRAMUSCULAR | Status: AC
  Administered 2015-09-02: 10 mg via INTRAVENOUS
  Filled 2015-09-02: qty 2

## 2015-09-02 MED ORDER — SODIUM CHLORIDE 0.9 % IV BOLUS (SEPSIS)
1000.0000 mL | Freq: Once | INTRAVENOUS | Status: AC
  Administered 2015-09-02: 1000 mL via INTRAVENOUS

## 2015-09-02 MED ORDER — PROMETHAZINE HCL 25 MG PO TABS: 25.0000 mg | ORAL_TABLET | Freq: Four times a day (QID) | ORAL | Status: DC | PRN

## 2015-09-02 NOTE — ED Provider Notes (Signed)
CSN: [id number]     Arrival date & time 09/02/15  0730 History   First MD Initiated Contact with Patient 09/02/15 [phone]     Chief Complaint  Patient presents with  . Emesis     (Consider location/radiation/quality/duration/timing/severity/associated sxs/prior Treatment) HPI Comments: 50yo F who p/w vomiting and diarrhea. Pt states 2 days ago she began vomiting, last episode was around 1:30 PM yesterday. She endorses ongoing nausea. Last night, she began having nonbloody diarrhea and has had several episodes since then. She denies any significant abdominal pain. No fevers, urinary symptoms, or cough/cold symptoms although she states that she has had hoarseness recently. She states that several people at work are sick with similar symptoms. She came to the ED yesterday and waited several hours but left before being seen.  Patient is a 50 y.o. female presenting with vomiting. The history is provided by the patient.  Emesis   Past Medical History  Diagnosis Date  . Migraines    History reviewed. No pertinent past surgical history. No family history on file. Social History  Substance Use Topics  . Smoking status: Current Every Day Smoker -- 0.50 packs/day    Types: Cigarettes  . Smokeless tobacco: Never Used  . Alcohol Use: No     Comment: occ   OB History    No data available     Review of Systems  Gastrointestinal: Positive for vomiting.    10 Systems reviewed and are negative for acute change except as noted in the HPI.   Allergies  Other and Percocet  Home Medications   Prior to Admission medications   Medication Sig Start Date End Date Taking? Authorizing Provider  acetaminophen (TYLENOL) 325 MG tablet Take 650 mg by mouth every 6 (six) hours as needed for fever.   Yes Historical Provider, MD  ALPRAZolam Prudy Feeler) 1 MG tablet Take 0.5-1 mg by mouth 2 (two) times daily as needed for anxiety.    Yes Historical Provider, MD  ibuprofen (ADVIL,MOTRIN) 200 MG tablet Take  400-600 mg by mouth every 6 (six) hours as needed for moderate pain.    Yes Historical Provider, MD  naproxen (NAPROSYN) 500 MG tablet Take 1 tablet (500 mg total) by mouth 2 (two) times daily with a meal. 08/13/15  Yes Alvira Monday, MD  Pseudoeph-Doxylamine-DM-APAP (NYQUIL PO) Take 2 capsules by mouth at bedtime as needed (cold symptoms).   Yes Historical Provider, MD  butalbital-acetaminophen-caffeine (FIORICET) 50-325-40 MG tablet Take 1 tablet by mouth every 6 (six) hours as needed for headache. Patient not taking: Reported on 07/20/2015 05/24/15   Joni Reining Pisciotta, PA-C  cyclobenzaprine (FLEXERIL) 10 MG tablet Take 1 tablet (10 mg total) by mouth 2 (two) times daily as needed for muscle spasms. 08/13/15   Alvira Monday, MD  hydrocortisone (ANUSOL-HC) 2.5 % rectal cream Apply rectally 2 times daily for 7 days max. Patient not taking: Reported on 05/03/2015 01/11/15   Fayrene Helper, PA-C  promethazine (PHENERGAN) 25 MG tablet Take 1 tablet (25 mg total) by mouth every 6 (six) hours as needed for nausea or vomiting. 09/02/15   Laurence Spates, MD   BP 103/73 mmHg  Pulse 62  Temp(Src) 97.7 F (36.5 C) (Oral)  Resp 18  SpO2 97% Physical Exam  Constitutional: She is oriented to person, place, and time. She appears well-developed and well-nourished. No distress.  HENT:  Head: Normocephalic and atraumatic.  Mouth/Throat: Oropharynx is clear and moist.  Moist mucous membranes  Eyes: Conjunctivae are normal. Pupils are equal, round,  and reactive to light.  Neck: Neck supple.  Cardiovascular: Normal rate, regular rhythm and normal heart sounds.   No murmur heard. Pulmonary/Chest: Effort normal and breath sounds normal.  Abdominal: Soft. Bowel sounds are normal. She exhibits no distension. There is no tenderness.  Musculoskeletal: She exhibits no edema.  Neurological: She is alert and oriented to person, place, and time.  Fluent speech  Skin: Skin is warm and dry.  Psychiatric: She has a  normal mood and affect. Judgment normal.  Nursing note and vitals reviewed.   ED Course  Procedures (including critical care time) Labs Review Labs Reviewed  COMPREHENSIVE METABOLIC PANEL - Abnormal; Notable for the following:    CO2 21 (*)    All other components within normal limits  URINALYSIS, ROUTINE W REFLEX MICROSCOPIC (NOT AT [facility]) - Abnormal; Notable for the following:    APPearance CLOUDY (*)    Specific Gravity, Urine 1.040 (*)    Bilirubin Urine SMALL (*)    All other components within normal limits  LIPASE, BLOOD  CBC  POC URINE PREG, ED    Imaging Review No results found. I have personally reviewed and evaluated these lab results as part of my medical decision-making.  Medications  metoCLOPramide (REGLAN) injection 10 mg (10 mg Intravenous Given 09/02/15 0844)  sodium chloride 0.9 % bolus 1,000 mL (1,000 mLs Intravenous New Bag/Given 09/02/15 0844)     MDM   Final diagnoses:  Vomiting and diarrhea    Patient with 2 days of vomiting and diarrhea that began yesterday. On exam, patient was well-appearing with normal vital signs. No abdominal tenderness or evidence of severe dehydration. Gave the patient Reglan and an IV fluid bolus. Obtained lab work which was unremarkable and showed no evidence of dehydration or leukocytosis. Patient able to tolerate fluids by mouth in the ED.  Symptoms consistent with a gastroenteritis process. I discussed supportive care instructions and provided with Phenergan to use at home. I reviewed return precautions including abd pain, fever, or worsening sx and patient voiced understanding. Reviewed follow up plan and patient was discharged in satisfactory condition.   Laurence Spates, MD 09/02/15 [phone]

## 2015-09-02 NOTE — ED Notes (Signed)
Pt unable to give urine sample at this time 

## 2015-09-02 NOTE — ED Notes (Signed)
Pt reports vomiting x 2 days, was here yesterday for same but did not wait to be seen.  Pt reports she waited over 3 hours and her back was starting to hurt.  Pt states she was at work yesterday when her boss told her to come to the ED to get a "shot" and return to work today.  Pt denies any abd pain but reports diarrhea which started at 0100 today.  Pt states she was given zofran while waiting yesterday and it gave her a h/a.  She states she can take phenergan okay.

## 2015-10-09 ENCOUNTER — Emergency Department (HOSPITAL_COMMUNITY)
Admission: EM | Admit: 2015-10-09 | Discharge: 2015-10-09 | Disposition: A | Discharge status: Home / Self Care | Payer: Self-pay | Attending: Emergency Medicine | Admitting: Emergency Medicine

## 2015-10-09 ENCOUNTER — Encounter (HOSPITAL_COMMUNITY): Payer: Self-pay | Admitting: Emergency Medicine

## 2015-10-09 DIAGNOSIS — R51 Headache: Secondary | ICD-10-CM | POA: Insufficient documentation

## 2015-10-09 DIAGNOSIS — Z8679 Personal history of other diseases of the circulatory system: Secondary | ICD-10-CM | POA: Insufficient documentation

## 2015-10-09 DIAGNOSIS — H53149 Visual discomfort, unspecified: Secondary | ICD-10-CM | POA: Insufficient documentation

## 2015-10-09 DIAGNOSIS — F419 Anxiety disorder, unspecified: Secondary | ICD-10-CM | POA: Insufficient documentation

## 2015-10-09 DIAGNOSIS — R519 Headache, unspecified: Secondary | ICD-10-CM

## 2015-10-09 DIAGNOSIS — F17210 Nicotine dependence, cigarettes, uncomplicated: Secondary | ICD-10-CM | POA: Insufficient documentation

## 2015-10-09 DIAGNOSIS — R110 Nausea: Secondary | ICD-10-CM | POA: Insufficient documentation

## 2015-10-09 HISTORY — DX: Anxiety disorder, unspecified: F41.9

## 2015-10-09 MED ORDER — PROMETHAZINE HCL 25 MG PO TABS
25.0000 mg | ORAL_TABLET | Freq: Once | ORAL | Status: AC
  Administered 2015-10-09: 25 mg via ORAL
  Filled 2015-10-09: qty 1

## 2015-10-09 MED ORDER — KETOROLAC TROMETHAMINE 30 MG/ML IJ SOLN
30.0000 mg | Freq: Once | INTRAMUSCULAR | Status: AC
  Administered 2015-10-09: 30 mg via INTRAVENOUS
  Filled 2015-10-09: qty 1

## 2015-10-09 MED ORDER — DEXAMETHASONE SODIUM PHOSPHATE 10 MG/ML IJ SOLN
10.0000 mg | Freq: Once | INTRAMUSCULAR | Status: AC
  Administered 2015-10-09: 10 mg via INTRAVENOUS
  Filled 2015-10-09: qty 1

## 2015-10-09 MED ORDER — NAPROXEN 500 MG PO TABS
500.0000 mg | ORAL_TABLET | Freq: Once | ORAL | Status: AC
  Administered 2015-10-09: 500 mg via ORAL
  Filled 2015-10-09: qty 1

## 2015-10-09 MED ORDER — SODIUM CHLORIDE 0.9 % IV BOLUS (SEPSIS)
1000.0000 mL | Freq: Once | INTRAVENOUS | Status: AC
  Administered 2015-10-09: 1000 mL via INTRAVENOUS

## 2015-10-09 NOTE — ED Notes (Signed)
Pt medicated per MD verbal order. Pt allowed to remain in dark triage room for comfort.

## 2015-10-09 NOTE — Discharge Instructions (Signed)
As discussed, your evaluation today has been largely reassuring.  But, it is important that you monitor your condition carefully, and do not hesitate to return to the ED if you develop new, or concerning changes in your condition. ? ?Otherwise, please follow-up with your physician for appropriate ongoing care. ? ?

## 2015-10-09 NOTE — ED Provider Notes (Signed)
CSN: [id number]     Arrival date & time 10/09/15  0445 History   First MD Initiated Contact with Patient 10/09/15 [phone]     Chief Complaint  Patient presents with  . Migraine    HPI Patient presents with concern of diffuse head pain.  Patient has a history of migraines. She states that characteristically this pain is similar, though possibly more severe than most of her headache.  Pain is focally in the left superior head, sore, severe, with spontaneous exacerbations. No relief with OTC medication. No new weakness anywhere, confusion, disorientation. There is nausea. Onset was 2 days ago. Since onset symptoms of been persistent. There is associated photophobia, but no vision loss.   Past Medical History  Diagnosis Date  . Migraines   . Anxiety    History reviewed. No pertinent past surgical history. No family history on file. Social History  Substance Use Topics  . Smoking status: Current Every Day Smoker -- 0.50 packs/day    Types: Cigarettes  . Smokeless tobacco: Never Used  . Alcohol Use: No     Comment: occ   OB History    No data available     Review of Systems  Constitutional:       Per HPI, otherwise negative  HENT:       Per HPI, otherwise negative  Eyes: Positive for photophobia.  Respiratory:       Per HPI, otherwise negative  Cardiovascular:       Per HPI, otherwise negative  Gastrointestinal: Positive for nausea. Negative for vomiting.  Endocrine:       Negative aside from HPI  Genitourinary:       Neg aside from HPI   Musculoskeletal:       Per HPI, otherwise negative  Skin: Negative.   Neurological: Positive for headaches. Negative for syncope.      Allergies  Other; Percocet; and Zofran  Home Medications   Prior to Admission medications   Medication Sig Start Date End Date Taking? Authorizing Provider  acetaminophen (TYLENOL) 325 MG tablet Take 650 mg by mouth every 6 (six) hours as needed for fever.   Yes Historical Provider, MD   ALPRAZolam Prudy Feeler) 1 MG tablet Take 0.5-1 mg by mouth 2 (two) times daily as needed for anxiety.    Yes Historical Provider, MD  diphenhydrAMINE (SOMINEX) 25 MG tablet Take 50 mg by mouth at bedtime as needed for allergies or sleep.   Yes Historical Provider, MD  ibuprofen (ADVIL,MOTRIN) 200 MG tablet Take 400-600 mg by mouth every 6 (six) hours as needed for moderate pain.    Yes Historical Provider, MD  butalbital-acetaminophen-caffeine (FIORICET) 50-325-40 MG tablet Take 1 tablet by mouth every 6 (six) hours as needed for headache. Patient not taking: Reported on 07/20/2015 05/24/15   Joni Reining Pisciotta, PA-C  cyclobenzaprine (FLEXERIL) 10 MG tablet Take 1 tablet (10 mg total) by mouth 2 (two) times daily as needed for muscle spasms. Patient not taking: Reported on 10/09/2015 08/13/15   Alvira Monday, MD  hydrocortisone (ANUSOL-HC) 2.5 % rectal cream Apply rectally 2 times daily for 7 days max. Patient not taking: Reported on 05/03/2015 01/11/15   Fayrene Helper, PA-C  naproxen (NAPROSYN) 500 MG tablet Take 1 tablet (500 mg total) by mouth 2 (two) times daily with a meal. Patient not taking: Reported on 10/09/2015 08/13/15   Alvira Monday, MD  promethazine (PHENERGAN) 25 MG tablet Take 1 tablet (25 mg total) by mouth every 6 (six) hours as needed for nausea  or vomiting. Patient not taking: Reported on 10/09/2015 09/02/15   Ambrose Finlandachel Morgan Little, MD   BP 101/86 mmHg  Pulse 99  Temp(Src) 98.2 F (36.8 C) (Oral)  Resp 18  Ht 5\' 2"  (1.575 m)  Wt 178 lb (80.74 kg)  BMI 32.55 kg/m2  SpO2 94% Physical Exam  Constitutional: She is oriented to person, place, and time. She appears well-developed and well-nourished. No distress.  HENT:  Head: Normocephalic and atraumatic.  Eyes: Conjunctivae and EOM are normal.  Cardiovascular: Normal rate and regular rhythm.   Pulmonary/Chest: Effort normal and breath sounds normal. No stridor. No respiratory distress.  Abdominal: She exhibits no distension.   Musculoskeletal: She exhibits no edema.  Neurological: She is alert and oriented to person, place, and time. She displays no atrophy and no tremor. No cranial nerve deficit. She exhibits normal muscle tone. She displays no seizure activity.  Skin: Skin is warm and dry.  Psychiatric: She has a normal mood and affect.  Nursing note and vitals reviewed.   ED Course  Procedures (including critical care time) On repeat exam the patient appears better. She has received fluids, anti-inflammatory, steroids, Phenergan.   8:51 AM Patient sleeping MDM  Patient with a history of migraines presents with characteristically similar headache. No red flags for new neurologic dysfunction. Patient required multiple medication for pain relief, but with resolution of symptoms, no red flags, there is low suspicion for new acute pathology. Patient discharged in stable condition.  Gerhard Munchobert Donnelle Olmeda, MD 10/09/15 [phone]

## 2015-10-09 NOTE — ED Notes (Signed)
Pt c/o migraine onset yesterday, multiple episodes of n/v throughout the day. Pt states she usually can take tylenol and lay down in dark room but this HA will not subside even after multiple doses of APAP.

## 2015-11-28 ENCOUNTER — Encounter (HOSPITAL_COMMUNITY): Payer: Self-pay | Admitting: Emergency Medicine

## 2015-11-28 ENCOUNTER — Emergency Department (HOSPITAL_COMMUNITY)
Admission: EM | Admit: 2015-11-28 | Discharge: 2015-11-28 | Disposition: A | Discharge status: Home / Self Care | Payer: Self-pay | Attending: Emergency Medicine | Admitting: Emergency Medicine

## 2015-11-28 DIAGNOSIS — M5442 Lumbago with sciatica, left side: Secondary | ICD-10-CM | POA: Insufficient documentation

## 2015-11-28 DIAGNOSIS — Z791 Long term (current) use of non-steroidal anti-inflammatories (NSAID): Secondary | ICD-10-CM | POA: Insufficient documentation

## 2015-11-28 DIAGNOSIS — F17210 Nicotine dependence, cigarettes, uncomplicated: Secondary | ICD-10-CM | POA: Insufficient documentation

## 2015-11-28 DIAGNOSIS — Z79899 Other long term (current) drug therapy: Secondary | ICD-10-CM | POA: Insufficient documentation

## 2015-11-28 MED ORDER — CYCLOBENZAPRINE HCL 10 MG PO TABS
10.0000 mg | ORAL_TABLET | Freq: Once | ORAL | Status: AC
  Administered 2015-11-28: 10 mg via ORAL
  Filled 2015-11-28: qty 1

## 2015-11-28 MED ORDER — CYCLOBENZAPRINE HCL 10 MG PO TABS: 10.0000 mg | ORAL_TABLET | Freq: Two times a day (BID) | ORAL | Status: DC | PRN

## 2015-11-28 MED ORDER — IBUPROFEN 800 MG PO TABS: 800.0000 mg | ORAL_TABLET | Freq: Three times a day (TID) | ORAL | Status: DC

## 2015-11-28 MED ORDER — HYDROCODONE-ACETAMINOPHEN 5-325 MG PO TABS
1.0000 | ORAL_TABLET | Freq: Once | ORAL | Status: AC
  Administered 2015-11-28: 1 via ORAL
  Filled 2015-11-28: qty 1

## 2015-11-28 MED ORDER — KETOROLAC TROMETHAMINE 30 MG/ML IJ SOLN
30.0000 mg | Freq: Once | INTRAMUSCULAR | Status: AC
  Administered 2015-11-28: 30 mg via INTRAMUSCULAR
  Filled 2015-11-28 (×2): qty 1

## 2015-11-28 MED ORDER — HYDROCODONE-ACETAMINOPHEN 5-325 MG PO TABS: 1.0000 | ORAL_TABLET | ORAL | Status: DC | PRN

## 2015-11-28 NOTE — Discharge Instructions (Signed)

## 2015-11-28 NOTE — ED Provider Notes (Signed)
CSN: [id number]     Arrival date & time 11/28/15  1433 History  By signing my name below, I, Placido SouLogan Joldersma, attest that this documentation has been prepared under the direction and in the presence of Venson Ferencz Y Dynver Clemson, [place]-C. Electronically Signed: Placido SouLogan Joldersma, ED Scribe. 11/28/2015. 2:50 PM.   Chief Complaint  Patient presents with  . Back Pain  . Leg Pain   The history is provided by the patient. No language interpreter was used.    HPI Comments: Kerry Young is a 50 y.o. female who presents to the Emergency Department complaining of constant, moderate, left lower back pain x 1 day. Pt reports a PMHx of sciatica and while doing heavy lifting and pulling yesterday at work exacerbated her chronic sciatica. Her pain radiates down her posterior left leg to her left foot. Pt reports associated paraesthesia in her left leg. Her pain worsens with ambulation and movement. She has applied Aspercreme and Icy Hot and taken Advil without significant relief. Pt has taken vicodin, flexeril and hydrocodone in the past which she states provides adequate relief of her sciatica exacerbations. Pt denies any other associated symptoms at this time.   Past Medical History  Diagnosis Date  . Migraines   . Anxiety    History reviewed. No pertinent past surgical history. No family history on file. Social History  Substance Use Topics  . Smoking status: Current Every Day Smoker -- 0.50 packs/day    Types: Cigarettes  . Smokeless tobacco: Never Used  . Alcohol Use: No     Comment: occ   OB History    No data available     Review of Systems A complete 10 system review of systems was obtained and all systems are negative except as noted in the HPI and PMH.   Allergies  Other; Percocet; and Zofran  Home Medications   Prior to Admission medications   Medication Sig Start Date End Date Taking? Authorizing Provider  acetaminophen (TYLENOL) 325 MG tablet Take 650 mg by mouth every 6 (six) hours as needed  for fever.    Historical Provider, MD  ALPRAZolam Prudy Feeler(XANAX) 1 MG tablet Take 0.5-1 mg by mouth 2 (two) times daily as needed for anxiety.     Historical Provider, MD  diphenhydrAMINE (SOMINEX) 25 MG tablet Take 50 mg by mouth at bedtime as needed for allergies or sleep.    Historical Provider, MD  ibuprofen (ADVIL,MOTRIN) 200 MG tablet Take 400-600 mg by mouth every 6 (six) hours as needed for moderate pain.     Historical Provider, MD   BP 103/75 mmHg  Pulse 93  Temp(Src) 97.8 F (36.6 C) (Oral)  Resp 19  SpO2 97%    Physical Exam  Constitutional: She is oriented to person, place, and time. She appears well-developed and well-nourished.  Appears uncomfortable, NAD  HENT:  Head: Normocephalic and atraumatic.  Eyes: EOM are normal.  Neck: Normal range of motion.  Cardiovascular: Normal rate.   Pulmonary/Chest: Effort normal. No respiratory distress.  Abdominal: Soft.  Musculoskeletal: Normal range of motion.       Back:  No midline back tenderness. No stepoff or deformity. Diffuse left lumbar paraspinal tenderness and spasm  Neurological: She is alert and oriented to person, place, and time. She has normal strength. No cranial nerve deficit or sensory deficit.  Ambulatory with steady gait   Skin: Skin is warm and dry.  Psychiatric: She has a normal mood and affect.  Nursing note and vitals reviewed.  ED Course  Procedures  DIAGNOSTIC STUDIES: Oxygen Saturation is 97% on RA, normal by my interpretation.    COORDINATION OF CARE: 2:49 PM Discussed next steps with pt. She verbalized understanding and is agreeable with the plan.   Labs Review Labs Reviewed - No data to display  Imaging Review No results found.   EKG Interpretation None      MDM   Final diagnoses:  Left-sided low back pain with left-sided sciatica    Likely musculoskeletal strain with spasm. No midline back tenderness or neuro deficits. Will give toradol, norco, and flexeril here with rx for  supportive meds. Pt in the process of establishing PCP and then will get referral to neurosurgery/ortho for her chronic back issues. No red flags for cauda equina or other emergent spinal process today. ER return precautions given.   I personally performed the services described in this documentation, which was scribed in my presence. The recorded information has been reviewed and is accurate.   Carlene Coria, PA-C 11/28/15 1502  Gerhard Munch, MD 11/30/15 [phone]

## 2015-11-28 NOTE — ED Notes (Addendum)
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 C/o pain left lower back radiating down into leg. Hx of same with sciatica.

## 2016-01-26 ENCOUNTER — Emergency Department (HOSPITAL_COMMUNITY)
Admission: EM | Admit: 2016-01-26 | Discharge: 2016-01-26 | Disposition: A | Discharge status: Home / Self Care | Payer: Self-pay | Attending: Emergency Medicine | Admitting: Emergency Medicine

## 2016-01-26 ENCOUNTER — Encounter (HOSPITAL_COMMUNITY): Payer: Self-pay | Admitting: Oncology

## 2016-01-26 DIAGNOSIS — S39012A Strain of muscle, fascia and tendon of lower back, initial encounter: Secondary | ICD-10-CM | POA: Insufficient documentation

## 2016-01-26 DIAGNOSIS — F17210 Nicotine dependence, cigarettes, uncomplicated: Secondary | ICD-10-CM | POA: Insufficient documentation

## 2016-01-26 DIAGNOSIS — Y929 Unspecified place or not applicable: Secondary | ICD-10-CM | POA: Insufficient documentation

## 2016-01-26 DIAGNOSIS — W010XXA Fall on same level from slipping, tripping and stumbling without subsequent striking against object, initial encounter: Secondary | ICD-10-CM | POA: Insufficient documentation

## 2016-01-26 DIAGNOSIS — Y939 Activity, unspecified: Secondary | ICD-10-CM | POA: Insufficient documentation

## 2016-01-26 DIAGNOSIS — Y999 Unspecified external cause status: Secondary | ICD-10-CM | POA: Insufficient documentation

## 2016-01-26 MED ORDER — IBUPROFEN 800 MG PO TABS
800.0000 mg | ORAL_TABLET | Freq: Once | ORAL | Status: AC
  Administered 2016-01-26: 800 mg via ORAL
  Filled 2016-01-26: qty 1

## 2016-01-26 MED ORDER — CYCLOBENZAPRINE HCL 10 MG PO TABS: 10.0000 mg | ORAL_TABLET | Freq: Two times a day (BID) | ORAL | Status: DC | PRN

## 2016-01-26 MED ORDER — IBUPROFEN 800 MG PO TABS: 800.0000 mg | ORAL_TABLET | Freq: Three times a day (TID) | ORAL | Status: DC

## 2016-01-26 MED ORDER — CYCLOBENZAPRINE HCL 10 MG PO TABS
5.0000 mg | ORAL_TABLET | Freq: Once | ORAL | Status: AC
  Administered 2016-01-26: 5 mg via ORAL
  Filled 2016-01-26: qty 1

## 2016-01-26 NOTE — ED Notes (Signed)
Pt presents d/t a fall at work last night.  Per pt she was pushing a cart into the back and slipped causing her right leg to go in front of her and her left leg to go under her.  Pt is c/o lower back pain, left knee and left ankle pain.  600 mg ibuprofen taken last night w/o relief.

## 2016-01-26 NOTE — ED Notes (Signed)
Pt c/o severe lower back pain onset @2100  last night after slipping and falling onto floor while at work. pts mother will be picking pt up.

## 2016-01-26 NOTE — ED Provider Notes (Signed)
CSN: [id number]     Arrival date & time 01/26/16  0601 History   First MD Initiated Contact with Patient 01/26/16 0630     Chief Complaint  Patient presents with  . Fall     (Consider location/radiation/quality/duration/timing/severity/associated sxs/prior Treatment) HPI   50 year old female with history of anxiety presents for evaluation of a fall. Patient report last night at work she was pushing a cart when she slipped sliding her right leg in front of her and the left leg went under her. She report acute onset of pain to her left low back that radiates towards the lateral aspects of the left thigh. Describe pain is a sharp and tightness sensation, worsening with movement, moderate in intensity. She currently rates the pain as 8 out of 10. She took ibuprofen last night without any relief. She was unable to finish the rest of her shift. She denies hitting her head or loss of consciousness, denies any precipitating symptoms prior to fall. She was able to ambulate afterward. No complaint of bowel bladder incontinence or saddle anesthesia. No history of IV drug use or active cancer. She report been diagnosed with sciatica due to an L5 herniated disc in the past. She does not have an orthopedist.  Past Medical History  Diagnosis Date  . Migraines   . Anxiety    History reviewed. No pertinent past surgical history. No family history on file. Social History  Substance Use Topics  . Smoking status: Current Every Day Smoker -- 0.50 packs/day    Types: Cigarettes  . Smokeless tobacco: Never Used  . Alcohol Use: No     Comment: occ   OB History    No data available     Review of Systems  Constitutional: Negative for fever.  Musculoskeletal: Positive for back pain.  Skin: Negative for rash and wound.  Neurological: Negative for numbness.      Allergies  Other; Percocet; and Zofran  Home Medications   Prior to Admission medications   Medication Sig Start Date End Date Taking?  Authorizing Provider  acetaminophen (TYLENOL) 325 MG tablet Take 650 mg by mouth every 6 (six) hours as needed for fever.    Historical Provider, MD  ALPRAZolam Prudy Feeler(XANAX) 1 MG tablet Take 0.5-1 mg by mouth 2 (two) times daily as needed for anxiety.     Historical Provider, MD  cyclobenzaprine (FLEXERIL) 10 MG tablet Take 1 tablet (10 mg total) by mouth 2 (two) times daily as needed for muscle spasms. 11/28/15   Ace GinsSerena Y Sam, PA-C  diphenhydrAMINE (SOMINEX) 25 MG tablet Take 50 mg by mouth at bedtime as needed for allergies or sleep.    Historical Provider, MD  HYDROcodone-acetaminophen (NORCO/VICODIN) 5-325 MG tablet Take 1-2 tablets by mouth every 4 (four) hours as needed for severe pain. 11/28/15   Ace GinsSerena Y Sam, PA-C  ibuprofen (ADVIL,MOTRIN) 200 MG tablet Take 400-600 mg by mouth every 6 (six) hours as needed for moderate pain.     Historical Provider, MD  ibuprofen (ADVIL,MOTRIN) 800 MG tablet Take 1 tablet (800 mg total) by mouth 3 (three) times daily. 11/28/15   Ace GinsSerena Y Sam, PA-C   BP 118/81 mmHg  Pulse 79  Temp(Src) 98.7 F (37.1 C) (Oral)  Resp 14  Ht 5\' 2"  (1.575 m)  Wt 77.111 kg  BMI 31.09 kg/m2  SpO2 97% Physical Exam  Constitutional: She appears well-developed and well-nourished. No distress.  HENT:  Head: Atraumatic.  Eyes: Conjunctivae are normal.  Neck: Neck supple.  Cardiovascular:  Intact distal pulses.   Musculoskeletal: She exhibits tenderness (Tenderness to left paralumbar spinal muscle and left lumbosacral region on palpation without any significant midline spine tenderness crepitus or step-off. No bruising noted.).  5 out 5 strength to bilateral lower extremities with intact distal pulses  Neurological: She is alert.  Patellar deep tendon reflex intact bilaterally  Able to ambulate  Skin: No rash noted.  Psychiatric: She has a normal mood and affect.  Nursing note and vitals reviewed.   ED Course  Procedures (including critical care time)   MDM   Final  diagnoses:  Low back strain, initial encounter    BP 118/81 mmHg  Pulse 79  Temp(Src) 98.7 F (37.1 C) (Oral)  Resp 14  Ht  (1.575 m)  Wt 77.111 kg  BMI 31.09 kg/m2  SpO2 97%   6:48 AM Patient had a mechanical fall while at work yesterday. It appears she may have suffered a muscle strain to her left low back. No red flags. She is able to ambulate. She is neurovascularly intact. 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 Her injury does not want advanced imaging at this time and patient agrees. Orthopedist referral given. Patient discharged with Flexeril and ibuprofen.  Fayrene Helper, PA-C 01/26/16 1610  Azalia Bilis, MD 01/26/16 [phone]

## 2016-01-26 NOTE — Discharge Instructions (Signed)

## 2016-02-12 ENCOUNTER — Encounter (HOSPITAL_COMMUNITY): Payer: Self-pay | Admitting: Emergency Medicine

## 2016-02-12 ENCOUNTER — Emergency Department (HOSPITAL_COMMUNITY)
Admission: EM | Admit: 2016-02-12 | Discharge: 2016-02-12 | Disposition: A | Discharge status: Home / Self Care | Payer: Self-pay | Attending: Emergency Medicine | Admitting: Emergency Medicine

## 2016-02-12 DIAGNOSIS — M5416 Radiculopathy, lumbar region: Secondary | ICD-10-CM | POA: Insufficient documentation

## 2016-02-12 DIAGNOSIS — F17210 Nicotine dependence, cigarettes, uncomplicated: Secondary | ICD-10-CM | POA: Insufficient documentation

## 2016-02-12 DIAGNOSIS — M5410 Radiculopathy, site unspecified: Secondary | ICD-10-CM

## 2016-02-12 MED ORDER — CYCLOBENZAPRINE HCL 10 MG PO TABS: 10.0000 mg | ORAL_TABLET | Freq: Two times a day (BID) | ORAL | 0 refills | Status: DC | PRN

## 2016-02-12 MED ORDER — PREDNISONE 20 MG PO TABS: ORAL_TABLET | ORAL | 0 refills | Status: DC

## 2016-02-12 MED ORDER — TRAMADOL HCL 50 MG PO TABS: 50.0000 mg | ORAL_TABLET | Freq: Four times a day (QID) | ORAL | 0 refills | Status: DC | PRN

## 2016-02-12 MED ORDER — TRAMADOL HCL 50 MG PO TABS
50.0000 mg | ORAL_TABLET | Freq: Once | ORAL | Status: AC
  Administered 2016-02-12: 50 mg via ORAL
  Filled 2016-02-12: qty 1

## 2016-02-12 NOTE — ED Provider Notes (Signed)
[facility]-EMERGENCY DEPT Provider Note   CSN: [id number] Arrival date & time: 02/12/16  4098  First Provider Contact:  None       History   Chief Complaint Chief Complaint  Patient presents with  . Back Pain    HPI Kerry Young is a 50 y.o. female.  HPI   50 year old female with history of migraine anxiety and recent fall presenting for evaluation of back pain. Patient was seen more than 2 weeks ago for her back injury after she fell while at work. She was seen by me. Her evaluation at that time was remarkable for tenderness to her left low back without radicular pain. Patient was discharged with Flexeril and ibuprofen. Patient states she has been using heat and ice, as well as stretching but her symptoms persist. Now she has pain primarily on the left side of her back radiates down to her foot. Pain is moderate in intensity. No associated fever, chills, lightheadedness, dizziness, abdominal pain, dysuria, bowel or bladder incontinence, saddle anesthesia, or rash. She recently seek the help of a law group to get Worker's Comp. for her injury. She is requesting for an MRI of her back. She did reach out to the orthopedist that I refer recently but have not schedule an appointment yet. She mentioned that the Flexeril she had last time didn't help but no relief with the ibuprofen. She denies history of IV drug use or active cancer.  Past Medical History:  Diagnosis Date  . Anxiety   . Migraines     There are no active problems to display for this patient.   History reviewed. No pertinent surgical history.  OB History    No data available       Home Medications    Prior to Admission medications   Medication Sig Start Date End Date Taking? Authorizing Provider  acetaminophen (TYLENOL) 325 MG tablet Take 650 mg by mouth every 6 (six) hours as needed for fever.    Historical Provider, MD  ALPRAZolam Prudy Feeler) 1 MG tablet Take 0.5-1 mg by mouth 2 (two) times daily as needed for anxiety.      Historical Provider, MD  cyclobenzaprine (FLEXERIL) 10 MG tablet Take 1 tablet (10 mg total) by mouth 2 (two) times daily as needed for muscle spasms. 01/26/16   Fayrene Helper, PA-C  diphenhydrAMINE (SOMINEX) 25 MG tablet Take 50 mg by mouth at bedtime as needed for allergies or sleep.    Historical Provider, MD  HYDROcodone-acetaminophen (NORCO/VICODIN) 5-325 MG tablet Take 1-2 tablets by mouth every 4 (four) hours as needed for severe pain. 11/28/15   Ace Gins Sam, PA-C  ibuprofen (ADVIL,MOTRIN) 800 MG tablet Take 1 tablet (800 mg total) by mouth 3 (three) times daily. 01/26/16   Fayrene Helper, PA-C    Family History History reviewed. No pertinent family history.  Social History Social History  Substance Use Topics  . Smoking status: Current Every Day Smoker    Packs/day: 0.50    Types: Cigarettes  . Smokeless tobacco: Never Used  . Alcohol use No     Comment: occ     Allergies   Other; Percocet [oxycodone-acetaminophen]; and Zofran [ondansetron hcl]   Review of Systems Review of Systems  Constitutional: Negative for fever.  Endocrine: Negative for polyuria.  Genitourinary: Negative for dysuria, flank pain and pelvic pain.  Musculoskeletal: Positive for back pain.  Neurological: Negative for numbness.     Physical Exam Updated Vital Signs BP 108/84 (BP Location: Left Arm)  Pulse 89   Temp 97.5 F (36.4 C) (Oral)   Resp 16   SpO2 95%   Physical Exam  Constitutional: She appears well-developed and well-nourished. No distress.  HENT:  Head: Atraumatic.  Eyes: Conjunctivae are normal.  Neck: Neck supple.  Cardiovascular: Normal rate and regular rhythm.   Pulmonary/Chest: Effort normal and breath sounds normal.  Abdominal: Soft. There is no tenderness.  Musculoskeletal: She exhibits tenderness (Tenderness to left lumbar paraspinal muscle on palpation. Positive left straight leg raise. Patellar deep tendon reflex intact bilaterally, no foot drops, equal strength  bilaterally, able to ambulate).  Neurological: She is alert.  Skin: No rash noted. Pallor: .vs.  Psychiatric: She has a normal mood and affect.  Nursing note and vitals reviewed.    ED Treatments / Results  Labs (all labs ordered are listed, but only abnormal results are displayed) Labs Reviewed - No data to display  EKG  EKG Interpretation None       Radiology No results found.  Procedures Procedures (including critical care time)  Medications Ordered in ED Medications - No data to display   Initial Impression / Assessment and Plan / ED Course  I have reviewed the triage vital signs and the nursing notes.  Pertinent labs & imaging results that were available during my care of the patient were reviewed by me and considered in my medical decision making (see chart for details).  Clinical Course    BP 108/84 (BP Location: Left Arm)   Pulse 89   Temp 97.5 F (36.4 C) (Oral)   Resp 16   SpO2 95%    Final Clinical Impressions(s) / ED Diagnoses   Final diagnoses:  None    New Prescriptions New Prescriptions   No medications on file   10:44 AM Patient here with persistent left radicular low back pain radiates leg. This is from her prior injury while at work. No red flags on today's exam. Patient is in the process of following up with orthopedist for further management of her condition, she may benefit from an MRI of her back from orthopedist if deemed appropriate however at this time she is able to ambulate and I have low suspicion for cord compression. Plan to provide symptomatic treatment and outpatient follow-up.   Fayrene Helper, PA-C 02/12/16 1046    Tilden Fossa, MD 02/13/16 1134

## 2016-02-12 NOTE — ED Triage Notes (Signed)
Pt c/o left lower back pain radiating down left leg. Pt reports injuring her back at work x2 weeks ago.

## 2016-04-30 ENCOUNTER — Emergency Department (HOSPITAL_COMMUNITY)
Admission: EM | Admit: 2016-04-30 | Discharge: 2016-04-30 | Disposition: A | Discharge status: Home / Self Care | Payer: Self-pay | Attending: Emergency Medicine | Admitting: Emergency Medicine

## 2016-04-30 ENCOUNTER — Encounter (HOSPITAL_COMMUNITY): Payer: Self-pay | Admitting: Emergency Medicine

## 2016-04-30 DIAGNOSIS — F17210 Nicotine dependence, cigarettes, uncomplicated: Secondary | ICD-10-CM | POA: Insufficient documentation

## 2016-04-30 DIAGNOSIS — K602 Anal fissure, unspecified: Secondary | ICD-10-CM | POA: Insufficient documentation

## 2016-04-30 DIAGNOSIS — Z79899 Other long term (current) drug therapy: Secondary | ICD-10-CM | POA: Insufficient documentation

## 2016-04-30 LAB — URINALYSIS, ROUTINE W REFLEX MICROSCOPIC
GLUCOSE, UA: NEGATIVE mg/dL
Hgb urine dipstick: NEGATIVE
Ketones, ur: NEGATIVE mg/dL
Nitrite: NEGATIVE
PH: 6 (ref 5.0–8.0)
Protein, ur: NEGATIVE mg/dL
SPECIFIC GRAVITY, URINE: 1.024 (ref 1.005–1.030)

## 2016-04-30 LAB — BASIC METABOLIC PANEL
Anion gap: 8 (ref 5–15)
BUN: 18 mg/dL (ref 6–20)
CHLORIDE: 106 mmol/L (ref 101–111)
CO2: 24 mmol/L (ref 22–32)
CREATININE: 0.81 mg/dL (ref 0.44–1.00)
Calcium: 9.1 mg/dL (ref 8.9–10.3)
GFR calc Af Amer: >60 mL/min (ref >60)
GFR calc non Af Amer: >60 mL/min (ref >60)
GLUCOSE: 106 mg/dL — AB (ref 65–99)
Potassium: 4.1 mmol/L (ref 3.5–5.1)
SODIUM: 138 mmol/L (ref 135–145)

## 2016-04-30 LAB — CBC WITH DIFFERENTIAL/PLATELET
Basophils Absolute: 0 10*3/uL (ref 0.0–0.1)
Basophils Relative: 0 %
EOS ABS: 0.3 10*3/uL (ref 0.0–0.7)
Eosinophils Relative: 3 %
HCT: 44.2 % (ref 36.0–46.0)
HEMOGLOBIN: 15 g/dL (ref 12.0–15.0)
LYMPHS ABS: 2.1 10*3/uL (ref 0.7–4.0)
Lymphocytes Relative: 23 %
MCH: 29.7 pg (ref 26.0–34.0)
MCHC: 33.9 g/dL (ref 30.0–36.0)
MCV: 87.5 fL (ref 78.0–100.0)
MONOS PCT: 9 %
Monocytes Absolute: 0.9 10*3/uL (ref 0.1–1.0)
NEUTROS PCT: 65 %
Neutro Abs: 5.9 10*3/uL (ref 1.7–7.7)
Platelets: 303 10*3/uL (ref 150–400)
RBC: 5.05 MIL/uL (ref 3.87–5.11)
RDW: 13.1 % (ref 11.5–15.5)
WBC: 9.2 10*3/uL (ref 4.0–10.5)

## 2016-04-30 LAB — URINE MICROSCOPIC-ADD ON
BACTERIA UA: NONE SEEN
RBC / HPF: NONE SEEN RBC/hpf (ref 0–5)

## 2016-04-30 LAB — POC OCCULT BLOOD, ED: FECAL OCCULT BLD: POSITIVE — AB

## 2016-04-30 LAB — POC URINE PREG, ED: Preg Test, Ur: NEGATIVE

## 2016-04-30 MED ORDER — LIDOCAINE HCL 2 % EX GEL
1.0000 "application " | Freq: Once | CUTANEOUS | Status: AC
  Administered 2016-04-30: 1 via TOPICAL
  Filled 2016-04-30: qty 11

## 2016-04-30 MED ORDER — NAPROXEN 500 MG PO TABS
500.0000 mg | ORAL_TABLET | Freq: Once | ORAL | Status: AC
  Administered 2016-04-30: 500 mg via ORAL
  Filled 2016-04-30: qty 1

## 2016-04-30 MED ORDER — LIDOCAINE HCL 2 % EX GEL: 1.0000 "application " | Freq: Three times a day (TID) | CUTANEOUS | 0 refills | Status: DC | PRN

## 2016-04-30 MED ORDER — NITROGLYCERIN 0.4 % RE OINT: TOPICAL_OINTMENT | RECTAL | 0 refills | Status: DC

## 2016-04-30 MED ORDER — DOCUSATE SODIUM 100 MG PO CAPS: 100.0000 mg | ORAL_CAPSULE | Freq: Two times a day (BID) | ORAL | 0 refills | Status: DC

## 2016-04-30 MED ORDER — NAPROXEN 375 MG PO TABS: 375.0000 mg | ORAL_TABLET | Freq: Two times a day (BID) | ORAL | 0 refills | Status: DC | PRN

## 2016-04-30 NOTE — ED Triage Notes (Signed)
Pt comes from home with complaints of possible hemorrhoids.  States in the past day or two she has had severe pain in her lower back and rectum.  States she is raw and it burns and thinks they are internal.  Pt reports the last time she noticed blood in the toilet was 3 days ago.

## 2016-04-30 NOTE — Discharge Instructions (Signed)
Increase fiber intake.

## 2016-04-30 NOTE — ED Provider Notes (Signed)
[facility]-EMERGENCY DEPT Provider Note   CSN: [id number] Arrival date & time: 04/30/16  0409     History   Chief Complaint Chief Complaint  Patient presents with  . Rectal Bleeding  . Rectal Pain    HPI Kerry Young is a 50 y.o. female.  HPI  This is a 50 year old female who presents with rectal pain and bleeding. Patient reports 2 to three-day history of rectal pain. She is noted blood with wiping and in the toilet. She is unable to quantify. She reports hard stools and burning with defecation. She also reports lower back pain. This does not lateralize. Currently her pain is 8 out of 10. She's not taken anything for the pain. She tried hemorrhoid cream with minimal relief.  Past Medical History:  Diagnosis Date  . Anxiety   . Migraines     There are no active problems to display for this patient.   History reviewed. No pertinent surgical history.  OB History    No data available       Home Medications    Prior to Admission medications   Medication Sig Start Date End Date Taking? Authorizing Provider  acetaminophen (TYLENOL) 325 MG tablet Take 650 mg by mouth every 6 (six) hours as needed for fever.    Historical Provider, MD  ALPRAZolam Prudy Feeler) 1 MG tablet Take 0.5-1 mg by mouth 2 (two) times daily as needed for anxiety.     Historical Provider, MD  cyclobenzaprine (FLEXERIL) 10 MG tablet Take 1 tablet (10 mg total) by mouth 2 (two) times daily as needed for muscle spasms. 02/12/16   Fayrene Helper, PA-C  diphenhydrAMINE (SOMINEX) 25 MG tablet Take 50 mg by mouth at bedtime as needed for allergies or sleep.    Historical Provider, MD  docusate sodium (COLACE) 100 MG capsule Take 1 capsule (100 mg total) by mouth every 12 (twelve) hours. 04/30/16   Shon Baton, MD  HYDROcodone-acetaminophen (NORCO/VICODIN) 5-325 MG tablet Take 1-2 tablets by mouth every 4 (four) hours as needed for severe pain. 11/28/15   Ace Gins Sam, PA-C  ibuprofen (ADVIL,MOTRIN) 800 MG tablet Take 1  tablet (800 mg total) by mouth 3 (three) times daily. 01/26/16   Fayrene Helper, PA-C  lidocaine (XYLOCAINE) 2 % jelly Apply 1 application topically 3 (three) times daily as needed. 04/30/16   Shon Baton, MD  Nitroglycerin 0.4 % OINT Apply to rectum twice daily 04/30/16   Shon Baton, MD  predniSONE (DELTASONE) 20 MG tablet 3 tabs po day one, then 2 tabs daily x 4 days 02/12/16   Fayrene Helper, PA-C  traMADol (ULTRAM) 50 MG tablet Take 1 tablet (50 mg total) by mouth every 6 (six) hours as needed. 02/12/16   Fayrene Helper, PA-C    Family History No family history on file.  Social History Social History  Substance Use Topics  . Smoking status: Current Every Day Smoker    Packs/day: 0.50    Types: Cigarettes  . Smokeless tobacco: Never Used  . Alcohol use No     Comment: occ     Allergies   Other; Percocet [oxycodone-acetaminophen]; and Zofran [ondansetron hcl]   Review of Systems Review of Systems  Constitutional: Negative for fever.  Respiratory: Negative for shortness of breath.   Cardiovascular: Negative for chest pain.  Gastrointestinal: Positive for blood in stool, constipation and rectal pain. Negative for abdominal pain, diarrhea, nausea and vomiting.  Genitourinary: Negative for dysuria.  All other systems reviewed and are negative.  Physical Exam Updated Vital Signs BP 94/61 (BP Location: Right Arm)   Pulse 86   Temp 98.3 F (36.8 C) (Oral)   Resp 18   SpO2 94%   Physical Exam  Constitutional: She is oriented to person, place, and time. She appears well-developed and well-nourished.  Overweight  HENT:  Head: Normocephalic and atraumatic.  Cardiovascular: Normal rate and regular rhythm.   Pulmonary/Chest: Effort normal and breath sounds normal. No respiratory distress. She has no wheezes.  Abdominal: Soft. Bowel sounds are normal. There is no tenderness. There is no guarding.  Genitourinary: Rectal exam shows guaiac positive stool.  Genitourinary  Comments: Normal rectal tone, external hemorrhoids noted without bleeding, tenderness to palpation with digital rectal exam, fissure noted, no gross blood  Neurological: She is alert and oriented to person, place, and time.  Skin: Skin is warm and dry.  Psychiatric: She has a normal mood and affect.  Nursing note and vitals reviewed.    ED Treatments / Results  Labs (all labs ordered are listed, but only abnormal results are displayed) Labs Reviewed  BASIC METABOLIC PANEL - Abnormal; Notable for the following:       Result Value   Glucose, Bld 106 (*)    All other components within normal limits  URINALYSIS, ROUTINE W REFLEX MICROSCOPIC (NOT AT [facility]) - Abnormal; Notable for the following:    APPearance CLOUDY (*)    Bilirubin Urine SMALL (*)    Leukocytes, UA SMALL (*)    All other components within normal limits  URINE MICROSCOPIC-ADD ON - Abnormal; Notable for the following:    Squamous Epithelial / LPF 0-5 (*)    All other components within normal limits  POC OCCULT BLOOD, ED - Abnormal; Notable for the following:    Fecal Occult Bld POSITIVE (*)    All other components within normal limits  CBC WITH DIFFERENTIAL/PLATELET  OCCULT BLOOD X 1 CARD TO LAB, STOOL  POC URINE PREG, ED    EKG  EKG Interpretation None       Radiology No results found.  Procedures Procedures (including critical care time)  Medications Ordered in ED Medications  lidocaine (XYLOCAINE) 2 % jelly 1 application (1 application Topical Given 04/30/16 0549)  naproxen (NAPROSYN) tablet 500 mg (500 mg Oral Given 04/30/16 0630)     Initial Impression / Assessment and Plan / ED Course  I have reviewed the triage vital signs and the nursing notes.  Pertinent labs & imaging results that were available during my care of the patient were reviewed by me and considered in my medical decision making (see chart for details).  Clinical Course    Patient presents with rectal pain and bleeding. She is  nontoxic on exam. Hemoglobin is stable. No gross blood. She has been hemorrhoids and a small fissure. She's tried hemorrhoid treatment with minimal relief. Topical lidocaine was provided with some relief. Will provide topical nitroglycerin and topical lidocaine. Medical management at home. Colace for constipation.  After history, exam, and medical workup I feel the patient has been appropriately medically screened and is safe for discharge home. Pertinent diagnoses were discussed with the patient. Patient was given return precautions.   Final Clinical Impressions(s) / ED Diagnoses   Final diagnoses:  Anal fissure    New Prescriptions New Prescriptions   DOCUSATE SODIUM (COLACE) 100 MG CAPSULE    Take 1 capsule (100 mg total) by mouth every 12 (twelve) hours.   LIDOCAINE (XYLOCAINE) 2 % JELLY    Apply 1  application topically 3 (three) times daily as needed.   NITROGLYCERIN 0.4 % OINT    Apply to rectum twice daily     Shon Baton, MD 04/30/16 [phone]

## 2016-05-22 ENCOUNTER — Encounter (HOSPITAL_COMMUNITY): Payer: Self-pay

## 2016-05-22 ENCOUNTER — Emergency Department (HOSPITAL_COMMUNITY)
Admission: EM | Admit: 2016-05-22 | Discharge: 2016-05-22 | Disposition: A | Discharge status: Home / Self Care | Payer: Self-pay | Attending: Emergency Medicine | Admitting: Emergency Medicine

## 2016-05-22 DIAGNOSIS — F17210 Nicotine dependence, cigarettes, uncomplicated: Secondary | ICD-10-CM | POA: Insufficient documentation

## 2016-05-22 DIAGNOSIS — K6289 Other specified diseases of anus and rectum: Secondary | ICD-10-CM | POA: Insufficient documentation

## 2016-05-22 MED ORDER — HYDROCODONE-ACETAMINOPHEN 5-325 MG PO TABS
1.0000 | ORAL_TABLET | Freq: Once | ORAL | Status: AC
  Administered 2016-05-22: 1 via ORAL
  Filled 2016-05-22: qty 1

## 2016-05-22 NOTE — ED Provider Notes (Signed)
[facility]-EMERGENCY DEPT Provider Note   CSN: [id number] Arrival date & time: 05/22/16  0327 By signing my name below, I, Linus GalasMaharshi Patel, attest that this documentation has been prepared under the direction and in the presence of Azalia BilisKevin Reda Gettis, MD. Electronically Signed: Linus GalasMaharshi Patel, ED Scribe. 05/22/16. 4:05 AM.   History   Chief Complaint Chief Complaint  Patient presents with  . Rectal Pain    The history is provided by the patient. No language interpreter was used.   HPI Comments: Kerry Young is a 50 y.o. female who presents to the Emergency Department complaining of sudden onset of worsening rectal pain that began 1 day ago. Pt states she used the bathroom this morning and had a normal bowel movement. Afterwards, she suddenly had burning rectal pain. She used Preparation H Ointment with no relief. She had similar sypmtoms last month associated with an anal fissure. Pt denies any fevers, chills, CP, SOB, N/V/D or any other symptoms at this time.    Past Medical History:  Diagnosis Date  . Anxiety   . Migraines     There are no active problems to display for this patient.   History reviewed. No pertinent surgical history.  OB History    No data available       Home Medications    Prior to Admission medications   Medication Sig Start Date End Date Taking? Authorizing Provider  acetaminophen (TYLENOL) 325 MG tablet Take 650 mg by mouth every 6 (six) hours as needed for fever.    Historical Provider, MD  ALPRAZolam Prudy Feeler(XANAX) 1 MG tablet Take 0.5-1 mg by mouth 2 (two) times daily as needed for anxiety.     Historical Provider, MD  cyclobenzaprine (FLEXERIL) 10 MG tablet Take 1 tablet (10 mg total) by mouth 2 (two) times daily as needed for muscle spasms. 02/12/16   Fayrene HelperBowie Tran, PA-C  diphenhydrAMINE (SOMINEX) 25 MG tablet Take 50 mg by mouth at bedtime as needed for allergies or sleep.    Historical Provider, MD  docusate sodium (COLACE) 100 MG capsule Take 1 capsule (100 mg  total) by mouth every 12 (twelve) hours. 04/30/16   Shon Batonourtney F Horton, MD  HYDROcodone-acetaminophen (NORCO/VICODIN) 5-325 MG tablet Take 1-2 tablets by mouth every 4 (four) hours as needed for severe pain. 11/28/15   Ace GinsSerena Y Sam, PA-C  ibuprofen (ADVIL,MOTRIN) 800 MG tablet Take 1 tablet (800 mg total) by mouth 3 (three) times daily. 01/26/16   Fayrene HelperBowie Tran, PA-C  lidocaine (XYLOCAINE) 2 % jelly Apply 1 application topically 3 (three) times daily as needed. 04/30/16   Shon Batonourtney F Horton, MD  naproxen (NAPROSYN) 375 MG tablet Take 1 tablet (375 mg total) by mouth 2 (two) times daily as needed. 04/30/16   Lyndal Pulleyaniel Knott, MD  Nitroglycerin 0.4 % OINT Apply to rectum twice daily 04/30/16   Shon Batonourtney F Horton, MD  predniSONE (DELTASONE) 20 MG tablet 3 tabs po day one, then 2 tabs daily x 4 days 02/12/16   Fayrene HelperBowie Tran, PA-C  traMADol (ULTRAM) 50 MG tablet Take 1 tablet (50 mg total) by mouth every 6 (six) hours as needed. 02/12/16   Fayrene HelperBowie Tran, PA-C    Family History History reviewed. No pertinent family history.  Social History Social History  Substance Use Topics  . Smoking status: Current Every Day Smoker    Packs/day: 0.50    Types: Cigarettes  . Smokeless tobacco: Never Used  . Alcohol use No     Comment: occ     Allergies  Other; Percocet [oxycodone-acetaminophen]; and Zofran [ondansetron hcl]   Review of Systems Review of Systems A complete 10 system review of systems was obtained and all systems are negative except as noted in the HPI and PMH.    Physical Exam Updated Vital Signs BP 115/94   Pulse 91   Temp 97.7 F (36.5 C) (Oral)   Resp 18   Ht 5\' 2"  (1.575 m)   Wt 173 lb (78.5 kg)   SpO2 100%   BMI 31.64 kg/m   Physical Exam  Constitutional: She is oriented to person, place, and time. She appears well-developed and well-nourished. No distress.  HENT:  Head: Normocephalic and atraumatic.  Eyes: EOM are normal.  Neck: Normal range of motion.  Cardiovascular: Normal rate,  regular rhythm and normal heart sounds.   Pulmonary/Chest: Effort normal and breath sounds normal.  Abdominal: Soft. She exhibits no distension. There is no tenderness.  Genitourinary:  Genitourinary Comments: Rectal examination was chaperone present demonstrates normal external anal area without evidence of hemorrhoids.  Mild tenderness on digital rectal examination without focal mass.  Brown stool.  No gross blood.  No evidence of fissure noted  Musculoskeletal: Normal range of motion.  Neurological: She is alert and oriented to person, place, and time.  Skin: Skin is warm and dry.  Psychiatric: She has a normal mood and affect. Judgment normal.  Nursing note and vitals reviewed.  ED Treatments / Results  DIAGNOSTIC STUDIES: Oxygen Saturation is 100% on room air, normal by my interpretation.    COORDINATION OF CARE: 4:10 AM Discussed treatment plan with pt at bedside and pt agreed to plan.   Labs (all labs ordered are listed, but only abnormal results are displayed) Labs Reviewed - No data to display  EKG  EKG Interpretation None       Radiology No results found.  Procedures Procedures (including critical care time)  Medications Ordered in ED Medications  HYDROcodone-acetaminophen (NORCO/VICODIN) 5-325 MG per tablet 1 tablet (1 tablet Oral Given 05/22/16 0415)     Initial Impression / Assessment and Plan / ED Course  I have reviewed the triage vital signs and the nursing notes.  Pertinent labs & imaging results that were available during my care of the patient were reviewed by me and considered in my medical decision making (see chart for details).  Clinical Course    Mild tenderness on rectal examination but otherwise normal rectal examination with normal perineum.  Patient will need outpatient GI follow-up for possible direct visualization.  Final Clinical Impressions(s) / ED Diagnoses   Final diagnoses:  Rectal pain    New Prescriptions Current Discharge  Medication List     I personally performed the services described in this documentation, which was scribed in my presence. The recorded information has been reviewed and is accurate.        Azalia BilisKevin Elmer Boutelle, MD 05/22/16 [phone]

## 2016-05-22 NOTE — ED Triage Notes (Signed)
Pt c/o of rectal pain that feels like it is burning.

## 2016-07-25 ENCOUNTER — Encounter (HOSPITAL_COMMUNITY): Payer: Self-pay | Admitting: Emergency Medicine

## 2016-07-25 ENCOUNTER — Emergency Department (HOSPITAL_COMMUNITY)
Admission: EM | Admit: 2016-07-25 | Discharge: 2016-07-25 | Disposition: A | Discharge status: Home / Self Care | Payer: Self-pay | Attending: Emergency Medicine | Admitting: Emergency Medicine

## 2016-07-25 ENCOUNTER — Emergency Department (HOSPITAL_COMMUNITY): Payer: Self-pay

## 2016-07-25 DIAGNOSIS — R05 Cough: Secondary | ICD-10-CM | POA: Insufficient documentation

## 2016-07-25 DIAGNOSIS — J029 Acute pharyngitis, unspecified: Secondary | ICD-10-CM | POA: Insufficient documentation

## 2016-07-25 DIAGNOSIS — R6889 Other general symptoms and signs: Secondary | ICD-10-CM

## 2016-07-25 DIAGNOSIS — F17210 Nicotine dependence, cigarettes, uncomplicated: Secondary | ICD-10-CM | POA: Insufficient documentation

## 2016-07-25 DIAGNOSIS — Z79899 Other long term (current) drug therapy: Secondary | ICD-10-CM | POA: Insufficient documentation

## 2016-07-25 LAB — COMPREHENSIVE METABOLIC PANEL
ALK PHOS: 59 U/L (ref 38–126)
ALT: 15 U/L (ref 14–54)
ANION GAP: 9 (ref 5–15)
AST: 19 U/L (ref 15–41)
Albumin: 4.2 g/dL (ref 3.5–5.0)
BUN: 12 mg/dL (ref 6–20)
CALCIUM: 8.9 mg/dL (ref 8.9–10.3)
CO2: 22 mmol/L (ref 22–32)
Chloride: 107 mmol/L (ref 101–111)
Creatinine, Ser: 0.65 mg/dL (ref 0.44–1.00)
Glucose, Bld: 90 mg/dL (ref 65–99)
Potassium: 3.6 mmol/L (ref 3.5–5.1)
SODIUM: 138 mmol/L (ref 135–145)
TOTAL PROTEIN: 7.5 g/dL (ref 6.5–8.1)
Total Bilirubin: 1.1 mg/dL (ref 0.3–1.2)

## 2016-07-25 LAB — LIPASE, BLOOD: Lipase: 22 U/L (ref 11–51)

## 2016-07-25 LAB — CBC
HCT: 45.1 % (ref 36.0–46.0)
HEMOGLOBIN: 15.1 g/dL — AB (ref 12.0–15.0)
MCH: 29.8 pg (ref 26.0–34.0)
MCHC: 33.5 g/dL (ref 30.0–36.0)
MCV: 89.1 fL (ref 78.0–100.0)
PLATELETS: 290 10*3/uL (ref 150–400)
RBC: 5.06 MIL/uL (ref 3.87–5.11)
RDW: 13.3 % (ref 11.5–15.5)
WBC: 7.8 10*3/uL (ref 4.0–10.5)

## 2016-07-25 IMAGING — CR DG CHEST 2V
2 series · 2 of 2 positions shown · non-contrast
Comparison: [DATE]

CLINICAL DATA: Flu like symptoms

EXAM:
CHEST  2 VIEW

[w chest pa]
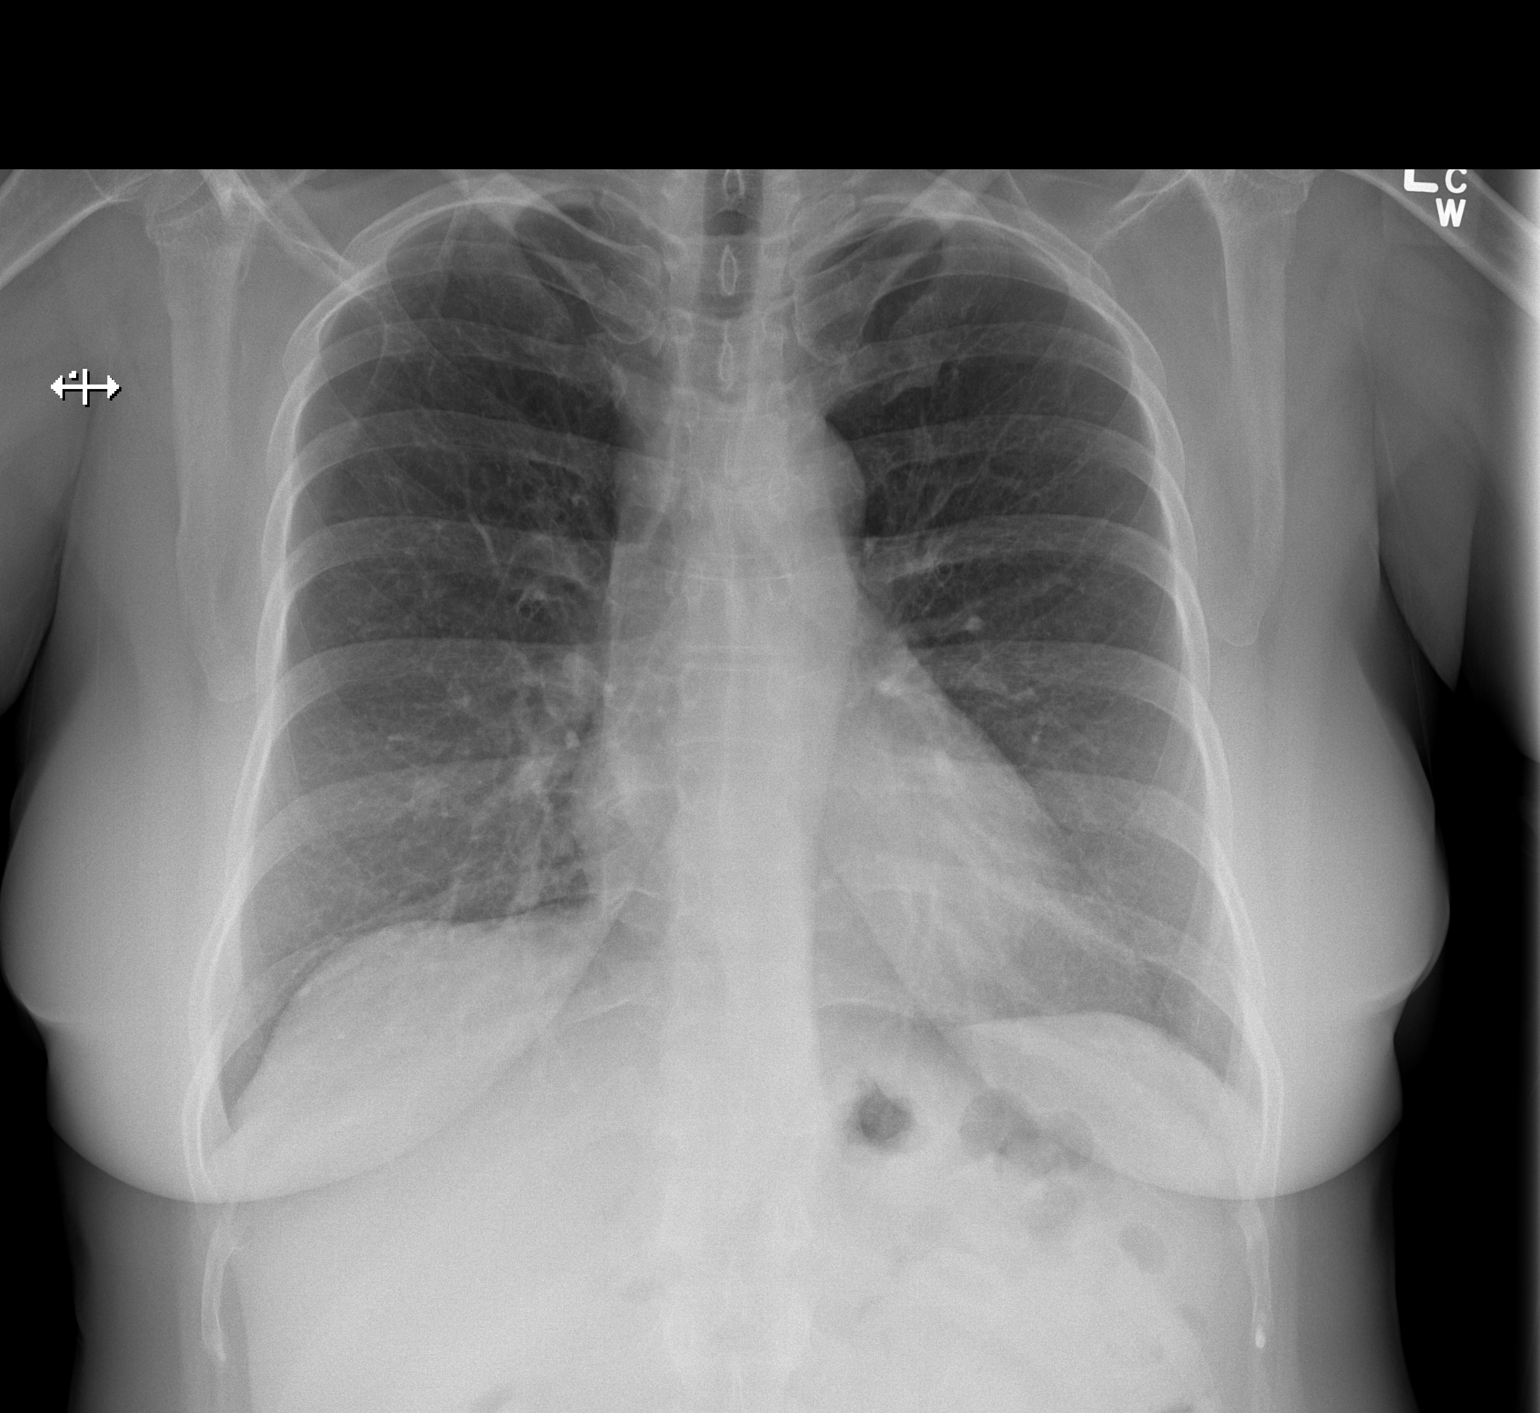

[w chest lat]
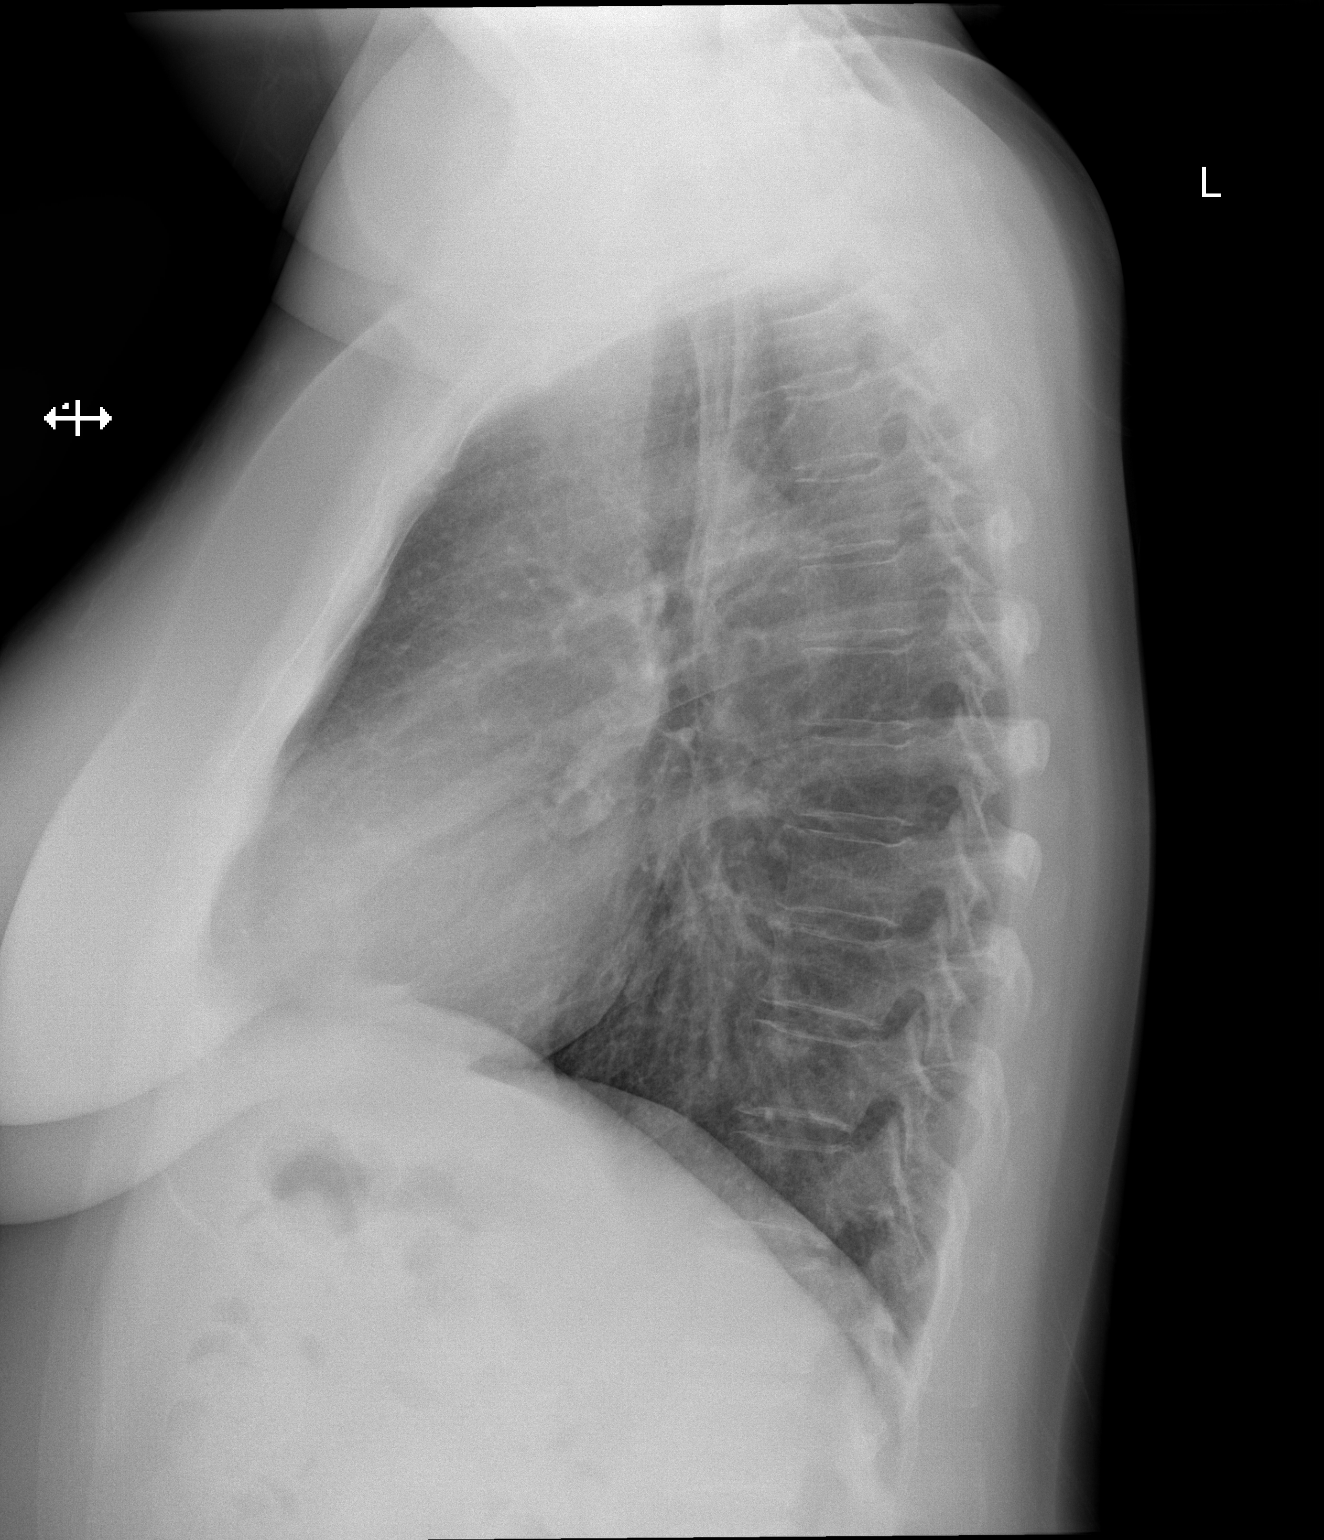

[2 of 2 positions shown; findings below may reference images not displayed]

FINDINGS: Normal heart size and stable mediastinal contours. Chronic mild
interstitial coarsening, stable. There is no edema, consolidation,
effusion, or pneumothorax. No acute osseous finding.
IMPRESSION: Stable from prior.  No evidence of acute disease.

## 2016-07-25 MED ORDER — BENZONATATE 100 MG PO CAPS: 100.0000 mg | ORAL_CAPSULE | Freq: Three times a day (TID) | ORAL | 0 refills | Status: DC

## 2016-07-25 MED ORDER — FLUTICASONE PROPIONATE 50 MCG/ACT NA SUSP: 2.0000 | Freq: Every day | NASAL | 2 refills | Status: DC

## 2016-07-25 NOTE — Discharge Instructions (Signed)
You likely have the flu. Your chest xray was normal. Please use the tessalon for cough. You may use the flonase for nasal congestion. You need to take motrin for body aches and tylenol for fever. Please alternate to stay ahead of fever. Use over the count mucinex DM for chest congestion. Drink plenty of water and get plenty of rest. You may return to work when you are 24 hours fever free without any medications.

## 2016-07-25 NOTE — ED Triage Notes (Signed)
Pt complaint of flu like symptoms with associated productive cough, sore throat, generalized body aches, and vomiting onset Thursday night.

## 2016-07-25 NOTE — ED Provider Notes (Signed)
[facility]-EMERGENCY DEPT Provider Note   CSN: [id number] Arrival date & time: 07/25/16  0930     History   Chief Complaint Chief Complaint  Patient presents with  . Flu Like Symptoms    HPI Kerry Young is a 51 y.o. female.  51 year old Caucasian female no significant past medical history presents to the ED today for flulike symptoms. Patient endorses a productive cough, sore throat, ear pain, rhinorrhea, generalized body aches, emesis, vomiting. Patient states her symptoms started approximately 2 days ago with a nonproductive cough and body aches that progressed to fevers and chills. Patient states that she is coughing up white phlegm. She also states she is having white to green nasal discharge. Patient has been trying Alka-Seltzer cold and flu over-the-counter to control her fevers. States that she had an episode of nausea and emesis yesterday. Denies any emesis today. She denies any abdominal pain. Patient endorses several sick contacts with the flu. Patient did not get a flu vaccination this year. Patient denies any headache, vision changes, neck pain, lightheadedness, dizziness, chest pain, shortness of breath, abdominal pain, urinary symptoms, change in bowel habits, numbness/tingling.      Past Medical History:  Diagnosis Date  . Anxiety   . Migraines     There are no active problems to display for this patient.   History reviewed. No pertinent surgical history.  OB History    No data available       Home Medications    Prior to Admission medications   Medication Sig Start Date End Date Taking? Authorizing Provider  ALPRAZolam Prudy Feeler(XANAX) 1 MG tablet Take 0.5-1 mg by mouth 2 (two) times daily as needed for anxiety.     Historical Provider, MD  benzonatate (TESSALON) 100 MG capsule Take 1 capsule (100 mg total) by mouth every 8 (eight) hours. 07/25/16   Rise MuKenneth T Ivania Teagarden, PA-C  docusate sodium (COLACE) 100 MG capsule Take 1 capsule (100 mg total) by mouth every 12 (twelve)  hours. 04/30/16   Shon Batonourtney F Horton, MD  fluticasone (FLONASE) 50 MCG/ACT nasal spray Place 2 sprays into both nostrils daily. 07/25/16   Rise MuKenneth T Sitlali Koerner, PA-C  lidocaine (XYLOCAINE) 2 % jelly Apply 1 application topically 3 (three) times daily as needed. Patient not taking: Reported on 05/22/2016 04/30/16   Shon Batonourtney F Horton, MD  naproxen (NAPROSYN) 375 MG tablet Take 1 tablet (375 mg total) by mouth 2 (two) times daily as needed. Patient not taking: Reported on 05/22/2016 04/30/16   Lyndal Pulleyaniel Knott, MD  Nitroglycerin 0.4 % OINT Apply to rectum twice daily Patient not taking: Reported on 05/22/2016 04/30/16   Shon Batonourtney F Horton, MD    Family History No family history on file.  Social History Social History  Substance Use Topics  . Smoking status: Current Every Day Smoker    Packs/day: 0.50    Types: Cigarettes  . Smokeless tobacco: Never Used  . Alcohol use No     Comment: occ     Allergies   Other; Percocet [oxycodone-acetaminophen]; and Zofran [ondansetron hcl]   Review of Systems Review of Systems  Constitutional: Positive for chills and fever (subjective).  HENT: Positive for congestion, ear pain, postnasal drip, rhinorrhea, sinus pain, sinus pressure, sneezing and sore throat.   Eyes: Negative for visual disturbance.  Respiratory: Positive for cough. Negative for shortness of breath.   Cardiovascular: Negative for chest pain and palpitations.  Gastrointestinal: Positive for nausea and vomiting. Negative for abdominal pain and diarrhea.  Genitourinary: Negative for flank pain,  frequency, hematuria and urgency.  Musculoskeletal: Negative for back pain, neck pain and neck stiffness.  Skin: Negative.   Neurological: Negative for dizziness, syncope, weakness, light-headedness and headaches.  All other systems reviewed and are negative.    Physical Exam Updated Vital Signs BP 105/72 (BP Location: Left Arm)   Pulse 89   Temp 97.9 F (36.6 C) (Oral)   Resp 18   Ht 5\' 2"   (1.575 m)   Wt 79.4 kg   SpO2 97%   BMI 32.01 kg/m   Physical Exam  Constitutional: She is oriented to person, place, and time. She appears well-developed and well-nourished. No distress.  HENT:  Head: Normocephalic and atraumatic.  Right Ear: Tympanic membrane, external ear and ear canal normal.  Left Ear: Tympanic membrane, external ear and ear canal normal.  Nose: Mucosal edema and rhinorrhea present. Right sinus exhibits no maxillary sinus tenderness and no frontal sinus tenderness. Left sinus exhibits no maxillary sinus tenderness and no frontal sinus tenderness.  Mouth/Throat: Uvula is midline and mucous membranes are normal. Posterior oropharyngeal edema and posterior oropharyngeal erythema present. No oropharyngeal exudate or tonsillar abscesses. Tonsils are 2+ on the right. Tonsils are 2+ on the left. No tonsillar exudate.  No muffled voice. Managing secretions.  Eyes: Conjunctivae are normal. Pupils are equal, round, and reactive to light. Right eye exhibits no discharge. Left eye exhibits no discharge. No scleral icterus.  Neck: Normal range of motion. Neck supple. No thyromegaly present.  Cardiovascular: Normal rate, regular rhythm, normal heart sounds and intact distal pulses.  Exam reveals no gallop and no friction rub.   No murmur heard. Pulmonary/Chest: Effort normal and breath sounds normal. No respiratory distress. She has no wheezes.  CTAB. Maintaining airway. Speaking in complete senteces. No stridor.    Abdominal: Soft. Bowel sounds are normal. She exhibits no distension. There is no tenderness. There is no rebound and no guarding.  Musculoskeletal: Normal range of motion.  Lymphadenopathy:    She has no cervical adenopathy.  Neurological: She is alert and oriented to person, place, and time.  Skin: Skin is warm and dry. Capillary refill takes less than 2 seconds.  Nursing note and vitals reviewed.    ED Treatments / Results  Labs (all labs ordered are listed,  but only abnormal results are displayed) Labs Reviewed  CBC - Abnormal; Notable for the following:       Result Value   Hemoglobin 15.1 (*)    All other components within normal limits  LIPASE, BLOOD  COMPREHENSIVE METABOLIC PANEL    EKG  EKG Interpretation None       Radiology Dg Chest 2 View  Result Date: 07/25/2016 CLINICAL DATA:  Flu like symptoms EXAM: CHEST  2 VIEW COMPARISON:  07/20/2015 FINDINGS: Normal heart size and stable mediastinal contours. Chronic mild interstitial coarsening, stable. There is no edema, consolidation, effusion, or pneumothorax. No acute osseous finding. IMPRESSION: Stable from prior.  No evidence of acute disease. Electronically Signed   By: Marnee Spring M.D.   On: 07/25/2016 09:56    Procedures Procedures (including critical care time)  Medications Ordered in ED Medications - No data to display   Initial Impression / Assessment and Plan / ED Course  I have reviewed the triage vital signs and the nursing notes.  Pertinent labs & imaging results that were available during my care of the patient were reviewed by me and considered in my medical decision making (see chart for details).  Clinical Course   MDM  Number of Diagnoses or Management Options Influenza-like symptoms:   Patient with symptoms consistent with influenza.  Vitals are stable, low-grade fever.  No signs of dehydration, tolerating PO's.  Lungs are clear. Due to patient's presentation and physical exam a chest x-ray was not ordered bc likely diagnosis of flu. Patient with one episode of emesis yesterday. Denies any focal abd pain or diarrhea. Doubt intraabdominal process.  Oropharynx without any signs of PTA or deep neck infection. Managing secretions and maintaining airway. All labs are unremarkable. Discussed the cost versus benefit of Tamiflu treatment with the patient.  The patient understands that symptoms are greater than the recommended 24-48 hour window of treatment.   Patient will be discharged with instructions to orally hydrate, rest, and use over-the-counter medications such as anti-inflammatories ibuprofen and Aleve for muscle aches and Tylenol for fever.  Patient will also be given a cough suppressant. Encouraged follow-up with her PCP. Patient is hemodynamically stable. Vital signs are stable and she is in no acute distress. Patient able tolerate by mouth fluids. Strict return precautions dicussed.  Final Clinical Impressions(s) / ED Diagnoses   Final diagnoses:  Influenza-like symptoms    New Prescriptions New Prescriptions   BENZONATATE (TESSALON) 100 MG CAPSULE    Take 1 capsule (100 mg total) by mouth every 8 (eight) hours.   FLUTICASONE (FLONASE) 50 MCG/ACT NASAL SPRAY    Place 2 sprays into both nostrils daily.      Rise Mu, PA-C 07/25/16 1203    Lorre Nick, MD 07/28/16 [phone]

## 2016-08-18 ENCOUNTER — Encounter (HOSPITAL_COMMUNITY): Payer: Self-pay | Admitting: Emergency Medicine

## 2016-08-18 ENCOUNTER — Emergency Department (HOSPITAL_COMMUNITY)
Admission: EM | Admit: 2016-08-18 | Discharge: 2016-08-18 | Disposition: A | Discharge status: Home / Self Care | Payer: Self-pay | Attending: Emergency Medicine | Admitting: Emergency Medicine

## 2016-08-18 ENCOUNTER — Emergency Department (HOSPITAL_COMMUNITY): Payer: Self-pay

## 2016-08-18 DIAGNOSIS — F17210 Nicotine dependence, cigarettes, uncomplicated: Secondary | ICD-10-CM | POA: Insufficient documentation

## 2016-08-18 DIAGNOSIS — R42 Dizziness and giddiness: Secondary | ICD-10-CM

## 2016-08-18 DIAGNOSIS — R0789 Other chest pain: Secondary | ICD-10-CM

## 2016-08-18 DIAGNOSIS — F419 Anxiety disorder, unspecified: Secondary | ICD-10-CM

## 2016-08-18 LAB — CBC
HEMATOCRIT: 44.2 % (ref 36.0–46.0)
Hemoglobin: 14.4 g/dL (ref 12.0–15.0)
MCH: 29.1 pg (ref 26.0–34.0)
MCHC: 32.6 g/dL (ref 30.0–36.0)
MCV: 89.3 fL (ref 78.0–100.0)
Platelets: 280 10*3/uL (ref 150–400)
RBC: 4.95 MIL/uL (ref 3.87–5.11)
RDW: 13.3 % (ref 11.5–15.5)
WBC: 8.5 10*3/uL (ref 4.0–10.5)

## 2016-08-18 LAB — BASIC METABOLIC PANEL
Anion gap: 8 (ref 5–15)
BUN: 19 mg/dL (ref 6–20)
CHLORIDE: 111 mmol/L (ref 101–111)
CO2: 21 mmol/L — ABNORMAL LOW (ref 22–32)
Calcium: 9 mg/dL (ref 8.9–10.3)
Creatinine, Ser: 0.87 mg/dL (ref 0.44–1.00)
GFR calc Af Amer: >60 mL/min (ref >60)
GFR calc non Af Amer: >60 mL/min (ref >60)
GLUCOSE: 92 mg/dL (ref 65–99)
POTASSIUM: 4.2 mmol/L (ref 3.5–5.1)
Sodium: 140 mmol/L (ref 135–145)

## 2016-08-18 LAB — I-STAT TROPONIN, ED: Troponin i, poc: 0 ng/mL (ref 0.00–0.08)

## 2016-08-18 IMAGING — CR DG CHEST 2V
2 series · 2 of 2 positions shown · non-contrast
Comparison: Radiographs [DATE]

CLINICAL DATA: Right-sided chest pain.  Dizziness.

EXAM:
CHEST  2 VIEW

[w chest pa]
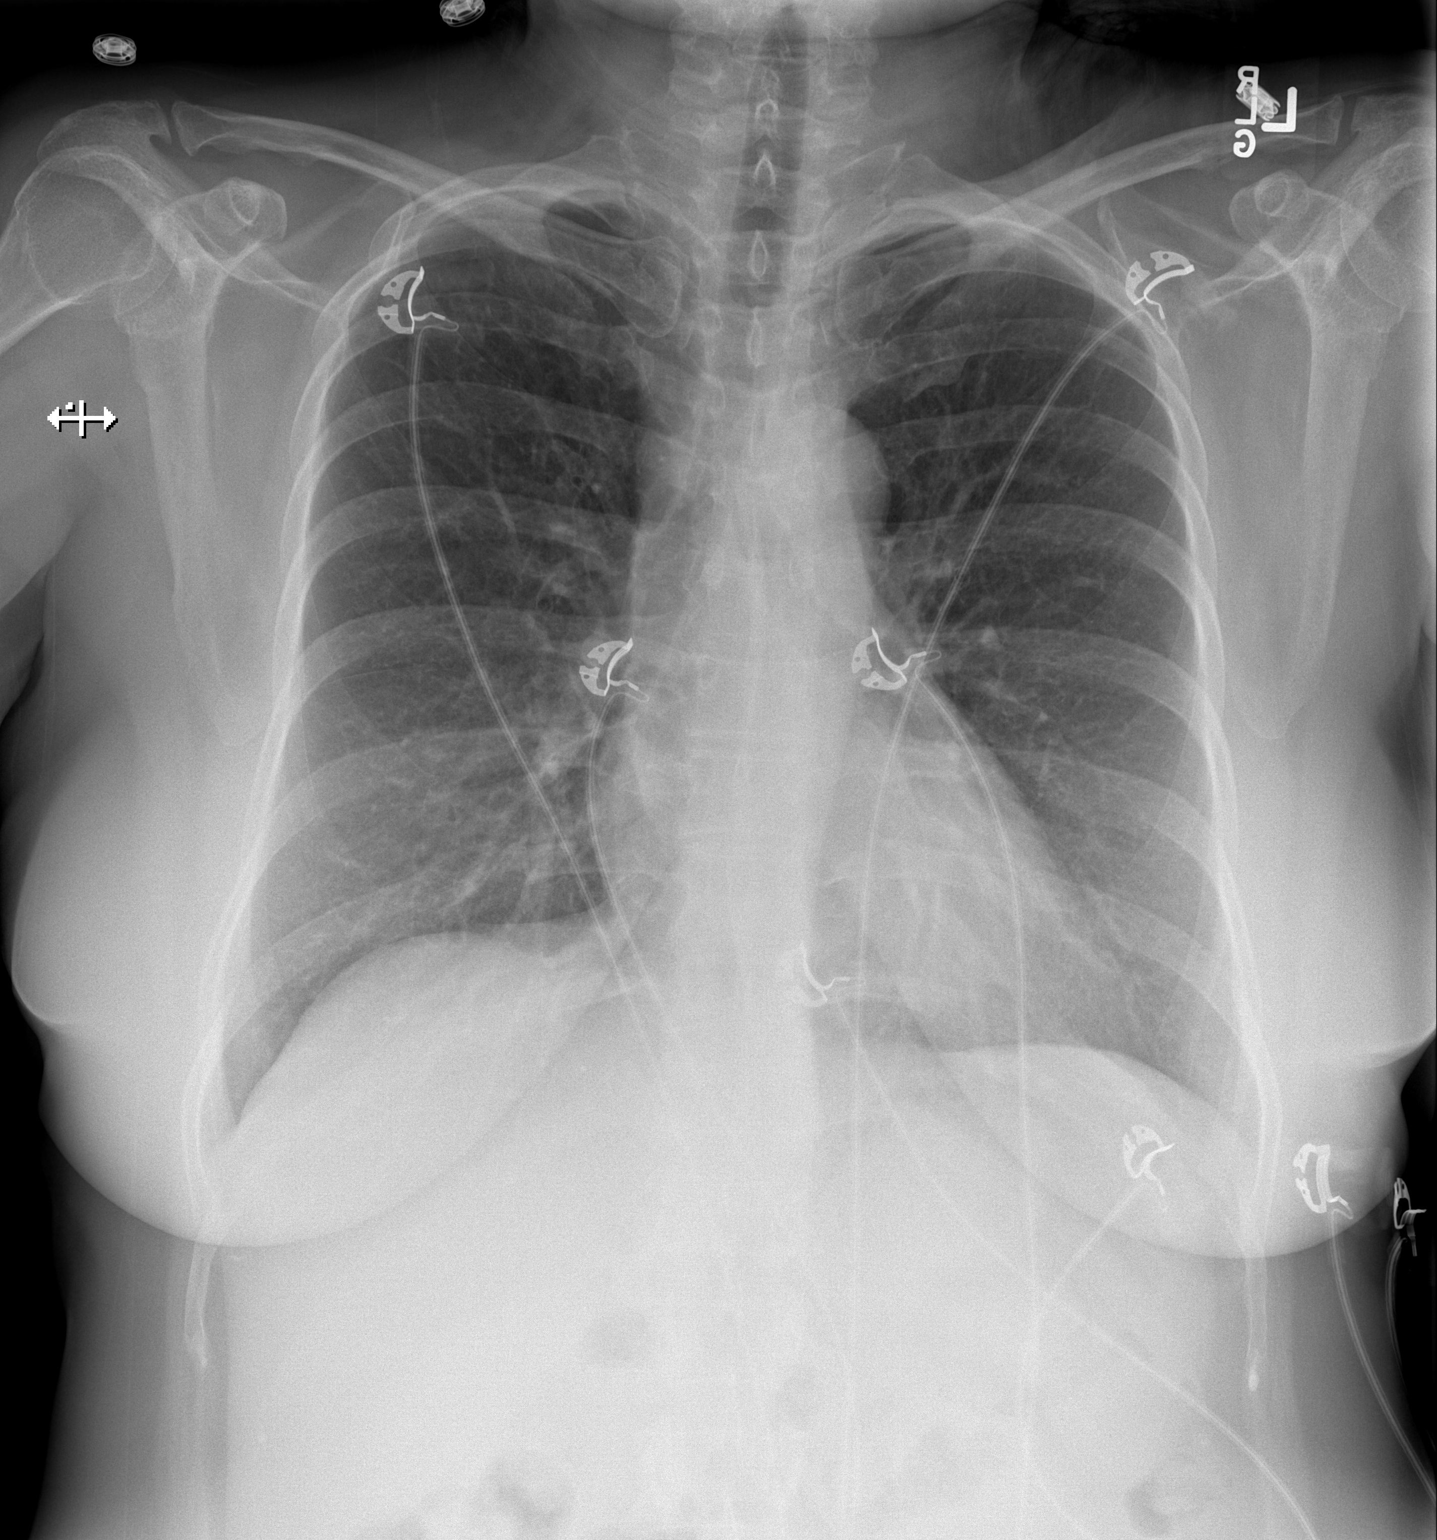

[w chest lat]
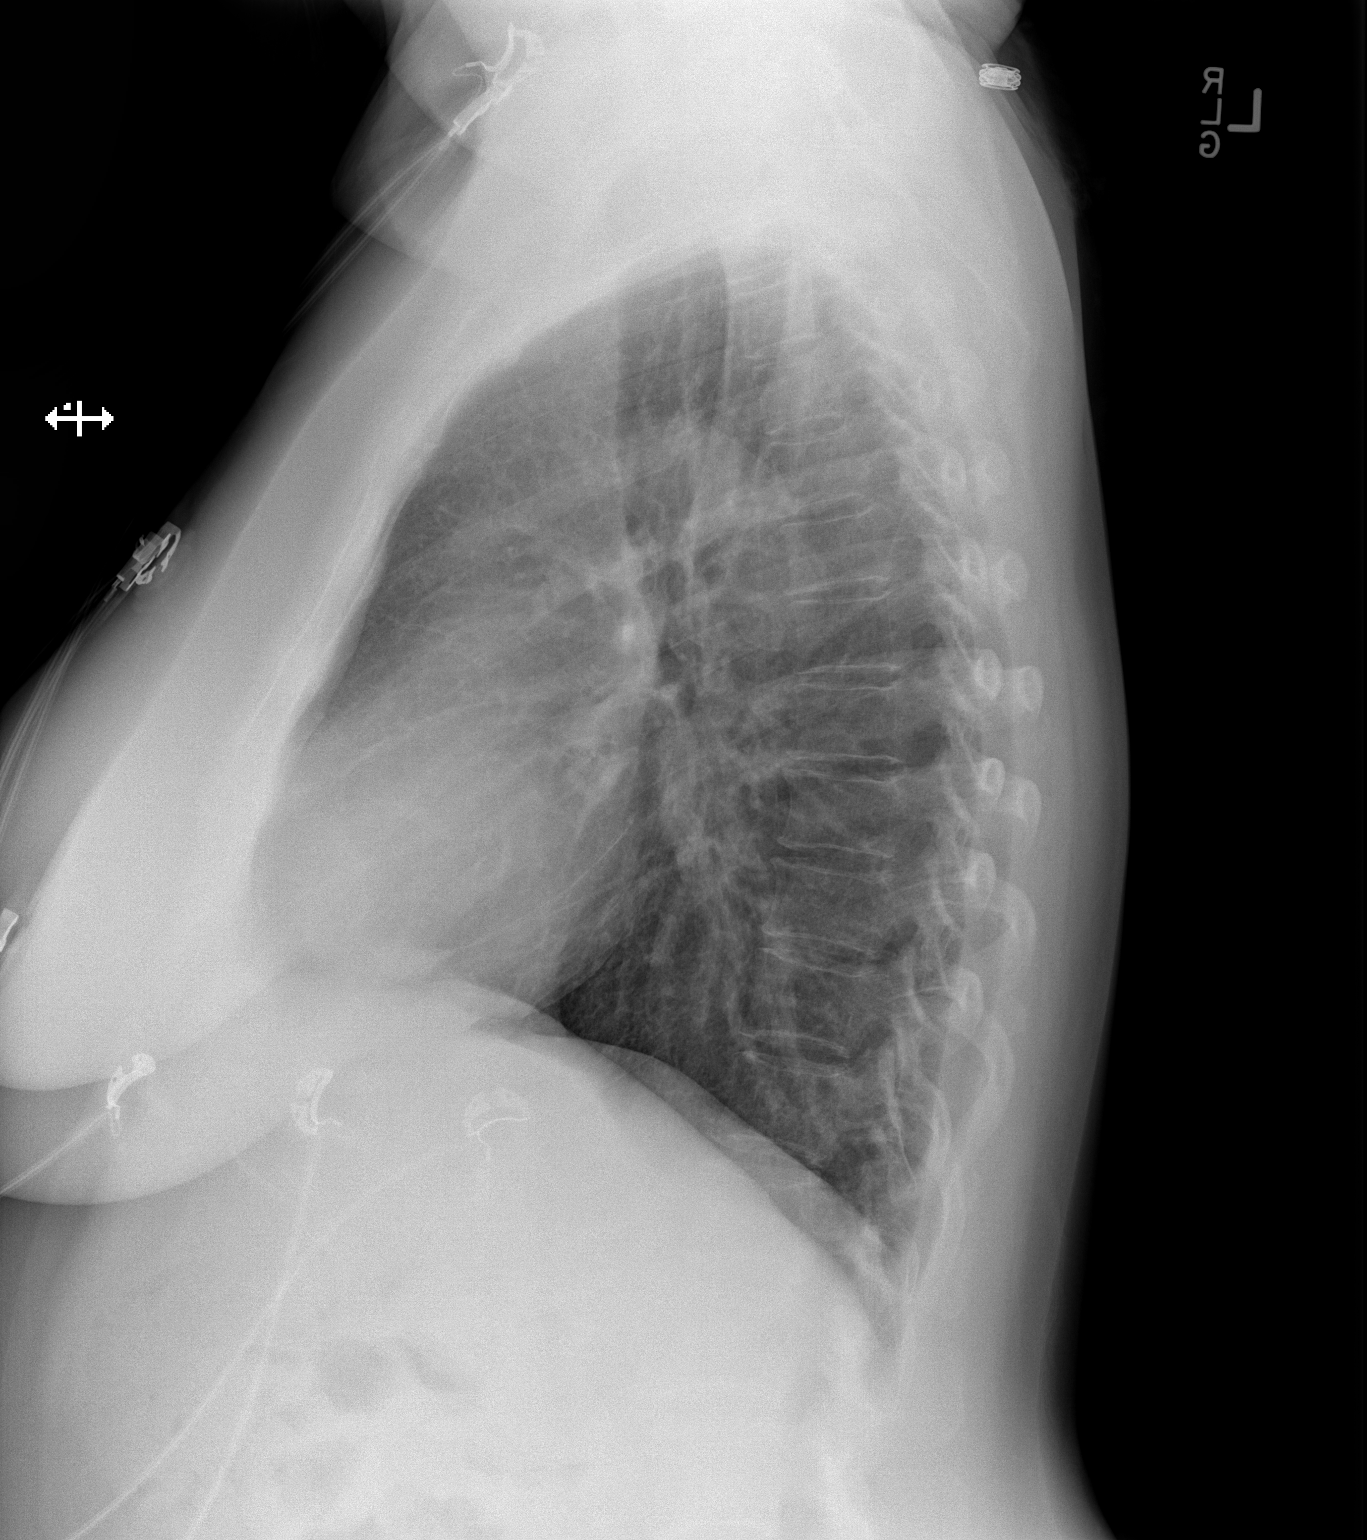

[2 of 2 positions shown; findings below may reference images not displayed]

FINDINGS: The cardiomediastinal contours are normal. Mild chronic interstitial
coarsening is stable. Pulmonary vasculature is normal. No
consolidation, pleural effusion, or pneumothorax. No acute osseous
abnormalities are seen.
IMPRESSION: No active cardiopulmonary disease.

## 2016-08-18 MED ORDER — PROMETHAZINE HCL 25 MG PO TABS: 25.0000 mg | ORAL_TABLET | Freq: Four times a day (QID) | ORAL | 0 refills | Status: DC | PRN

## 2016-08-18 MED ORDER — IBUPROFEN 800 MG PO TABS: 800.0000 mg | ORAL_TABLET | Freq: Three times a day (TID) | ORAL | 0 refills | Status: DC

## 2016-08-18 MED ORDER — MORPHINE SULFATE (PF) 4 MG/ML IV SOLN
4.0000 mg | Freq: Once | INTRAVENOUS | Status: AC
  Administered 2016-08-18: 4 mg via INTRAVENOUS
  Filled 2016-08-18: qty 1

## 2016-08-18 MED ORDER — HYDROXYZINE HCL 25 MG PO TABS: 25.0000 mg | ORAL_TABLET | Freq: Four times a day (QID) | ORAL | 0 refills | Status: DC

## 2016-08-18 MED ORDER — PROMETHAZINE HCL 25 MG/ML IJ SOLN
25.0000 mg | Freq: Once | INTRAMUSCULAR | Status: AC
  Administered 2016-08-18: 25 mg via INTRAVENOUS
  Filled 2016-08-18: qty 1

## 2016-08-18 MED ORDER — TRAMADOL HCL 50 MG PO TABS: 50.0000 mg | ORAL_TABLET | Freq: Four times a day (QID) | ORAL | 0 refills | Status: DC | PRN

## 2016-08-18 NOTE — ED Provider Notes (Signed)
[facility]PT Provider Note   CSN: [id number] Arrival date & time: 08/18/16  [id number]     History   Chief Complaint Chief Complaint  Patient presents with  . Chest Pain    HPI Kerry Young is a 51 y.o. female.  Patient presents with multiple complaints. She reports that for the last month or so she has been having dizziness episodes. Often when she bends over and then stands up or moves her head she feels like she is spinning. This happens mostly when she is at work, reports that she does have a very demanding job. Yesterday she started having pain in her right breast. Pain is sharp and stabbing, worsens if she moves her arm, bends over or touches the area. She denies any direct injury. There is no shortness of breath. Patient very concerned she might have breast cancer or brain cancer.      Past Medical History:  Diagnosis Date  . Anxiety   . Migraines     There are no active problems to display for this patient.   History reviewed. No pertinent surgical history.  OB History    No data available       Home Medications    Prior to Admission medications   Medication Sig Start Date End Date Taking? Authorizing Provider  ALPRAZolam Prudy Feeler(XANAX) 1 MG tablet Take 0.5-1 mg by mouth 2 (two) times daily as needed for anxiety.    Yes Historical Provider, MD  fluticasone (FLONASE) 50 MCG/ACT nasal spray Place 2 sprays into both nostrils daily. 07/25/16  Yes Iantha FallenKenneth T Leaphart, PA-C  benzonatate (TESSALON) 100 MG capsule Take 1 capsule (100 mg total) by mouth every 8 (eight) hours. Patient not taking: Reported on 08/18/2016 07/25/16   Rise MuKenneth T Leaphart, PA-C  docusate sodium (COLACE) 100 MG capsule Take 1 capsule (100 mg total) by mouth every 12 (twelve) hours. Patient not taking: Reported on 08/18/2016 04/30/16   Shon Batonourtney F Horton, MD  lidocaine (XYLOCAINE) 2 % jelly Apply 1 application topically 3 (three) times daily as needed. Patient not taking: Reported on 05/22/2016 04/30/16    Shon Batonourtney F Horton, MD  naproxen (NAPROSYN) 375 MG tablet Take 1 tablet (375 mg total) by mouth 2 (two) times daily as needed. Patient not taking: Reported on 05/22/2016 04/30/16   Lyndal Pulleyaniel Knott, MD  Nitroglycerin 0.4 % OINT Apply to rectum twice daily Patient not taking: Reported on 05/22/2016 04/30/16   Shon Batonourtney F Horton, MD    Family History History reviewed. No pertinent family history.  Social History Social History  Substance Use Topics  . Smoking status: Current Every Day Smoker    Packs/day: 0.50    Types: Cigarettes  . Smokeless tobacco: Never Used  . Alcohol use No     Comment: occ     Allergies   Other; Percocet [oxycodone-acetaminophen]; and Zofran [ondansetron hcl]   Review of Systems Review of Systems  Cardiovascular: Positive for chest pain.  Neurological: Positive for dizziness.  All other systems reviewed and are negative.    Physical Exam Updated Vital Signs BP 117/93 (BP Location: Right Arm)   Pulse 82   Temp 97.9 F (36.6 C) (Oral)   Resp 19   Ht 5\' 2"  (1.575 m)   Wt 175 lb (79.4 kg)   SpO2 99%   BMI 32.01 kg/m   Physical Exam  Constitutional: She is oriented to person, place, and time. She appears well-developed and well-nourished. No distress.  HENT:  Head: Normocephalic and atraumatic.  Right  Ear: Hearing normal.  Left Ear: Hearing normal.  Nose: Nose normal.  Mouth/Throat: Oropharynx is clear and moist and mucous membranes are normal.  Eyes: Conjunctivae and EOM are normal. Pupils are equal, round, and reactive to light.  Neck: Normal range of motion. Neck supple.  Cardiovascular: Regular rhythm, S1 normal and S2 normal.  Exam reveals no gallop and no friction rub.   No murmur heard. Pulmonary/Chest: Effort normal and breath sounds normal. No respiratory distress. She exhibits no tenderness. Right breast exhibits tenderness. Right breast exhibits no inverted nipple, no mass, no nipple discharge and no skin change. Breasts are symmetrical.   Abdominal: Soft. Normal appearance and bowel sounds are normal. There is no hepatosplenomegaly. There is no tenderness. There is no rebound, no guarding, no tenderness at McBurney's point and negative Murphy's sign. No hernia.  Musculoskeletal: Normal range of motion.  Neurological: She is alert and oriented to person, place, and time. She has normal strength. No cranial nerve deficit or sensory deficit. Coordination normal. GCS eye subscore is 4. GCS verbal subscore is 5. GCS motor subscore is 6.  Skin: Skin is warm, dry and intact. No rash noted. No cyanosis.  Psychiatric: Her speech is normal and behavior is normal. Thought content normal. Her mood appears anxious.  Nursing note and vitals reviewed.    ED Treatments / Results  Labs (all labs ordered are listed, but only abnormal results are displayed) Labs Reviewed  CBC  BASIC METABOLIC PANEL  I-STAT TROPOININ, ED    EKG  EKG Interpretation  Date/Time:  Tuesday August 18 2016 05:09:29 EST Ventricular Rate:  91 PR Interval:    QRS Duration: 87 QT Interval:  359 QTC Calculation: 442 R Axis:   45 Text Interpretation:  Sinus rhythm Normal ECG Confirmed by Meleni Delahunt  MD, Starling Jessie ([id number]) on 08/18/2016 5:11:50 AM       Radiology No results found.  Procedures Procedures (including critical care time)  Medications Ordered in ED Medications  morphine 4 MG/ML injection 4 mg (not administered)  promethazine (PHENERGAN) injection 25 mg (not administered)     Initial Impression / Assessment and Plan / ED Course  I have reviewed the triage vital signs and the nursing notes.  Pertinent labs & imaging results that were available during my care of the patient were reviewed by me and considered in my medical decision making (see chart for details).     Patient presents with multiple complaints. She has been having ongoing issues with dizziness and sense of spinning with changes of position of her head for more than a month.  This could potentially be consistent with vertigo. Symptoms are currently present. Neurologic exam is unremarkable, no focal deficits. Treat symptomatically, follow up with PCP.  Patient complaining of right breast pain. Breast examination does not reveal any overlying skin changes, masses, signs of infection. There is mild tenderness present, but the entire chest wall appears to be tender. 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 Treat with analgesia and follow-up with PCP.  Patient extremely anxious. There is certainly anxiety and possible panic attack component to her symptoms. She is fearful that she is going to lose her job. She also is fearful of multiple kinds of cancer. Will treat with Vistaril as needed for anxiety.  Final Clinical Impressions(s) / ED Diagnoses   Final diagnoses:  Chest wall pain  Anxiety  Vertigo    New Prescriptions New Prescriptions   No medications on file  [name], MD 08/18/16 [phone]

## 2016-08-18 NOTE — ED Triage Notes (Signed)
Pt presents to ED with c/o right-sided chest pain, onset yesterday--- describes pain as "feels like it's being punched".  Pt also c/o dizziness, which has been on-going for a month.  Pt denies shortness of breath.

## 2016-09-01 ENCOUNTER — Encounter (HOSPITAL_COMMUNITY): Payer: Self-pay

## 2016-09-01 ENCOUNTER — Emergency Department (HOSPITAL_COMMUNITY): Payer: Self-pay

## 2016-09-01 ENCOUNTER — Emergency Department (HOSPITAL_COMMUNITY)
Admission: EM | Admit: 2016-09-01 | Discharge: 2016-09-01 | Disposition: A | Discharge status: Home / Self Care | Payer: Self-pay | Attending: Emergency Medicine | Admitting: Emergency Medicine

## 2016-09-01 DIAGNOSIS — R197 Diarrhea, unspecified: Secondary | ICD-10-CM

## 2016-09-01 DIAGNOSIS — F17210 Nicotine dependence, cigarettes, uncomplicated: Secondary | ICD-10-CM | POA: Insufficient documentation

## 2016-09-01 DIAGNOSIS — R112 Nausea with vomiting, unspecified: Secondary | ICD-10-CM | POA: Insufficient documentation

## 2016-09-01 DIAGNOSIS — J069 Acute upper respiratory infection, unspecified: Secondary | ICD-10-CM | POA: Insufficient documentation

## 2016-09-01 LAB — COMPREHENSIVE METABOLIC PANEL
ALBUMIN: 4 g/dL (ref 3.5–5.0)
ALT: 31 U/L (ref 14–54)
ANION GAP: 9 (ref 5–15)
AST: 30 U/L (ref 15–41)
Alkaline Phosphatase: 67 U/L (ref 38–126)
BUN: 12 mg/dL (ref 6–20)
CHLORIDE: 108 mmol/L (ref 101–111)
CO2: 19 mmol/L — AB (ref 22–32)
Calcium: 9.1 mg/dL (ref 8.9–10.3)
Creatinine, Ser: 0.75 mg/dL (ref 0.44–1.00)
GFR calc Af Amer: >60 mL/min (ref >60)
GFR calc non Af Amer: >60 mL/min (ref >60)
Glucose, Bld: 117 mg/dL — ABNORMAL HIGH (ref 65–99)
POTASSIUM: 3.7 mmol/L (ref 3.5–5.1)
SODIUM: 136 mmol/L (ref 135–145)
TOTAL PROTEIN: 7.8 g/dL (ref 6.5–8.1)
Total Bilirubin: 0.6 mg/dL (ref 0.3–1.2)

## 2016-09-01 LAB — CBC WITH DIFFERENTIAL/PLATELET
BASOS ABS: 0 10*3/uL (ref 0.0–0.1)
BASOS PCT: 0 %
EOS ABS: 0.1 10*3/uL (ref 0.0–0.7)
Eosinophils Relative: 1 %
HCT: 43.1 % (ref 36.0–46.0)
Hemoglobin: 14.8 g/dL (ref 12.0–15.0)
Lymphocytes Relative: 18 %
Lymphs Abs: 1.1 10*3/uL (ref 0.7–4.0)
MCH: 29.8 pg (ref 26.0–34.0)
MCHC: 34.3 g/dL (ref 30.0–36.0)
MCV: 86.7 fL (ref 78.0–100.0)
MONO ABS: 0.7 10*3/uL (ref 0.1–1.0)
Monocytes Relative: 12 %
Neutro Abs: 4.3 10*3/uL (ref 1.7–7.7)
Neutrophils Relative %: 69 %
PLATELETS: 261 10*3/uL (ref 150–400)
RBC: 4.97 MIL/uL (ref 3.87–5.11)
RDW: 13 % (ref 11.5–15.5)
WBC: 6.2 10*3/uL (ref 4.0–10.5)

## 2016-09-01 IMAGING — CR DG CHEST 2V
2 series · 2 of 2 positions shown · non-contrast
Comparison: [DATE]

CLINICAL DATA: Nausea, vomiting, body aches, and flu symptoms.

EXAM:
CHEST  2 VIEW

[w chest lat]
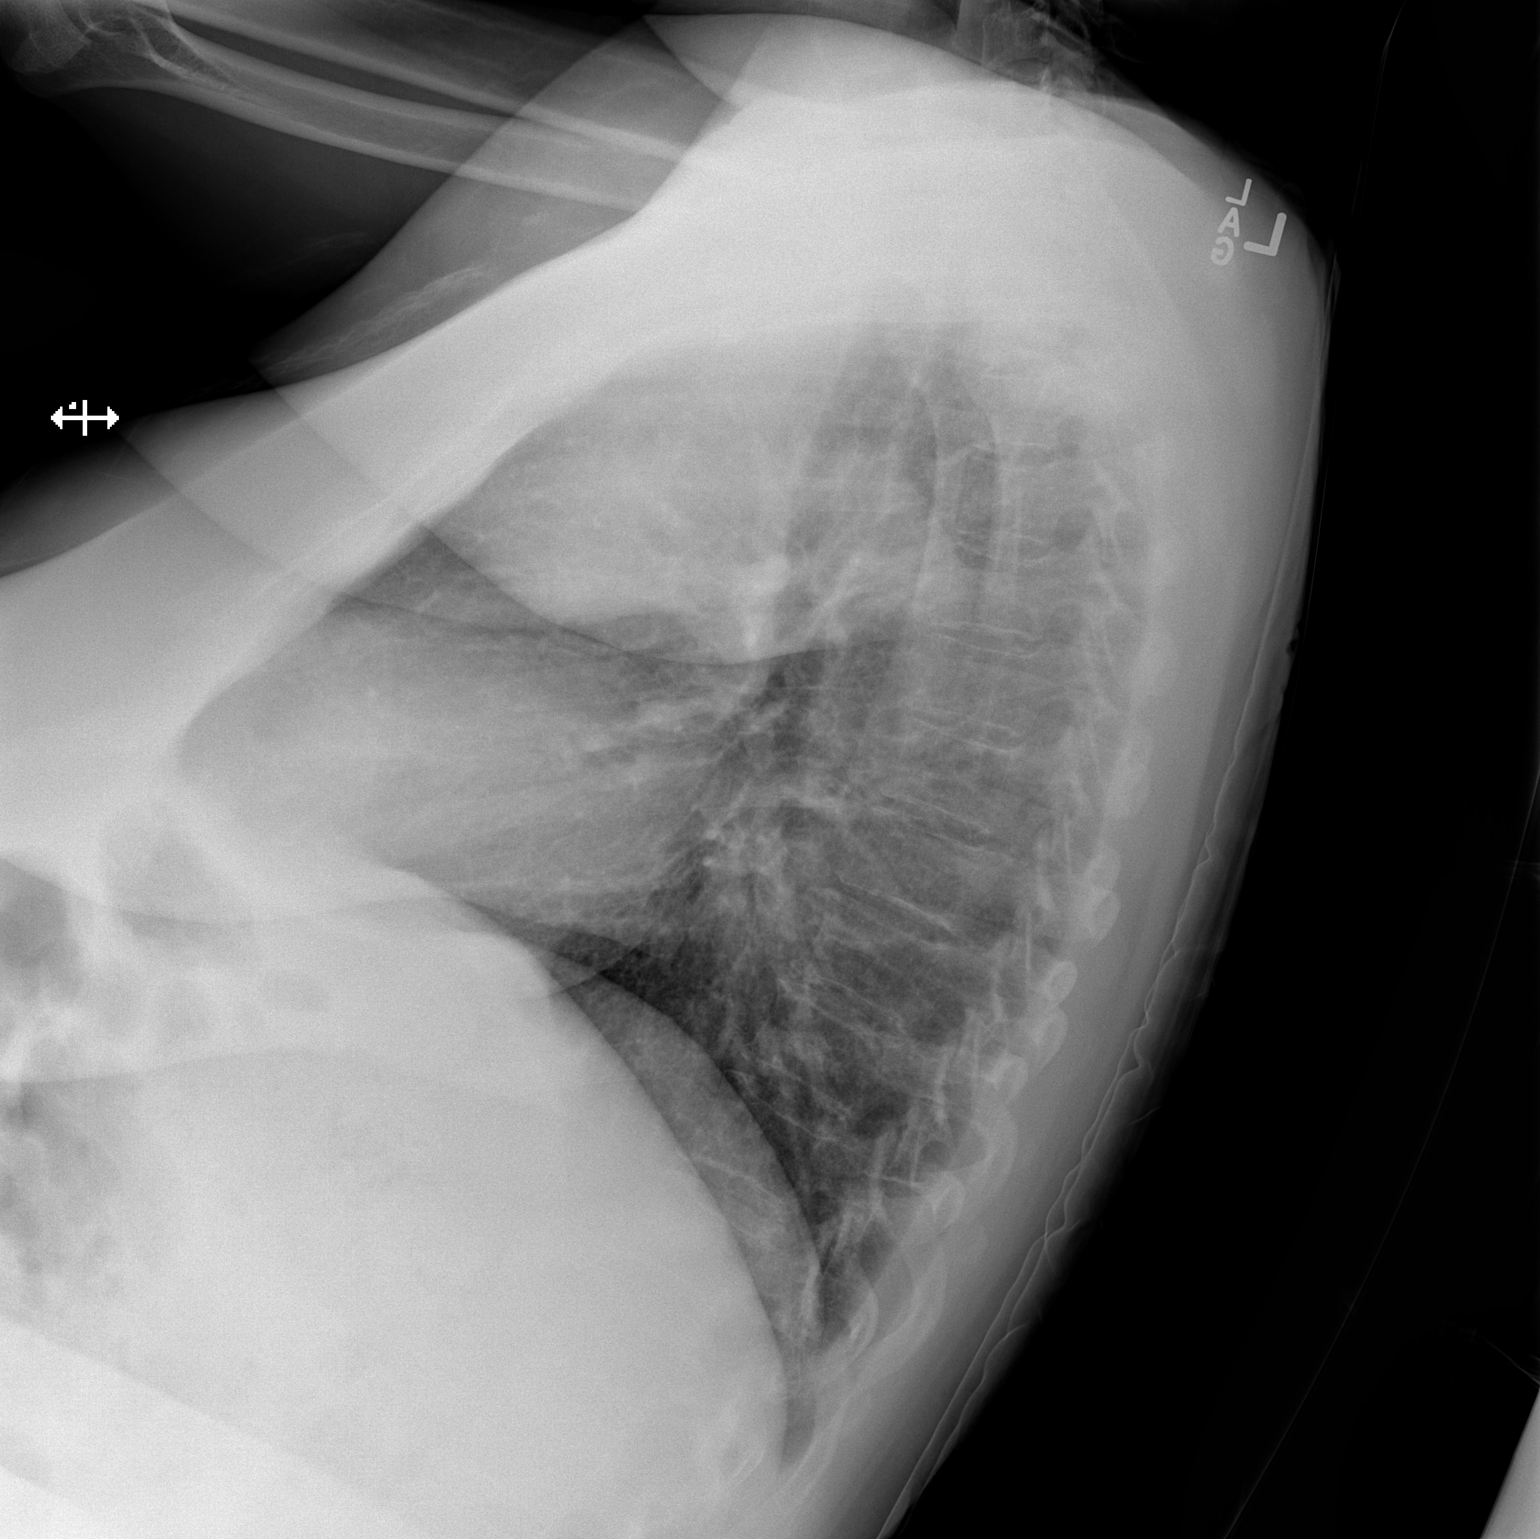

[x chest ap]
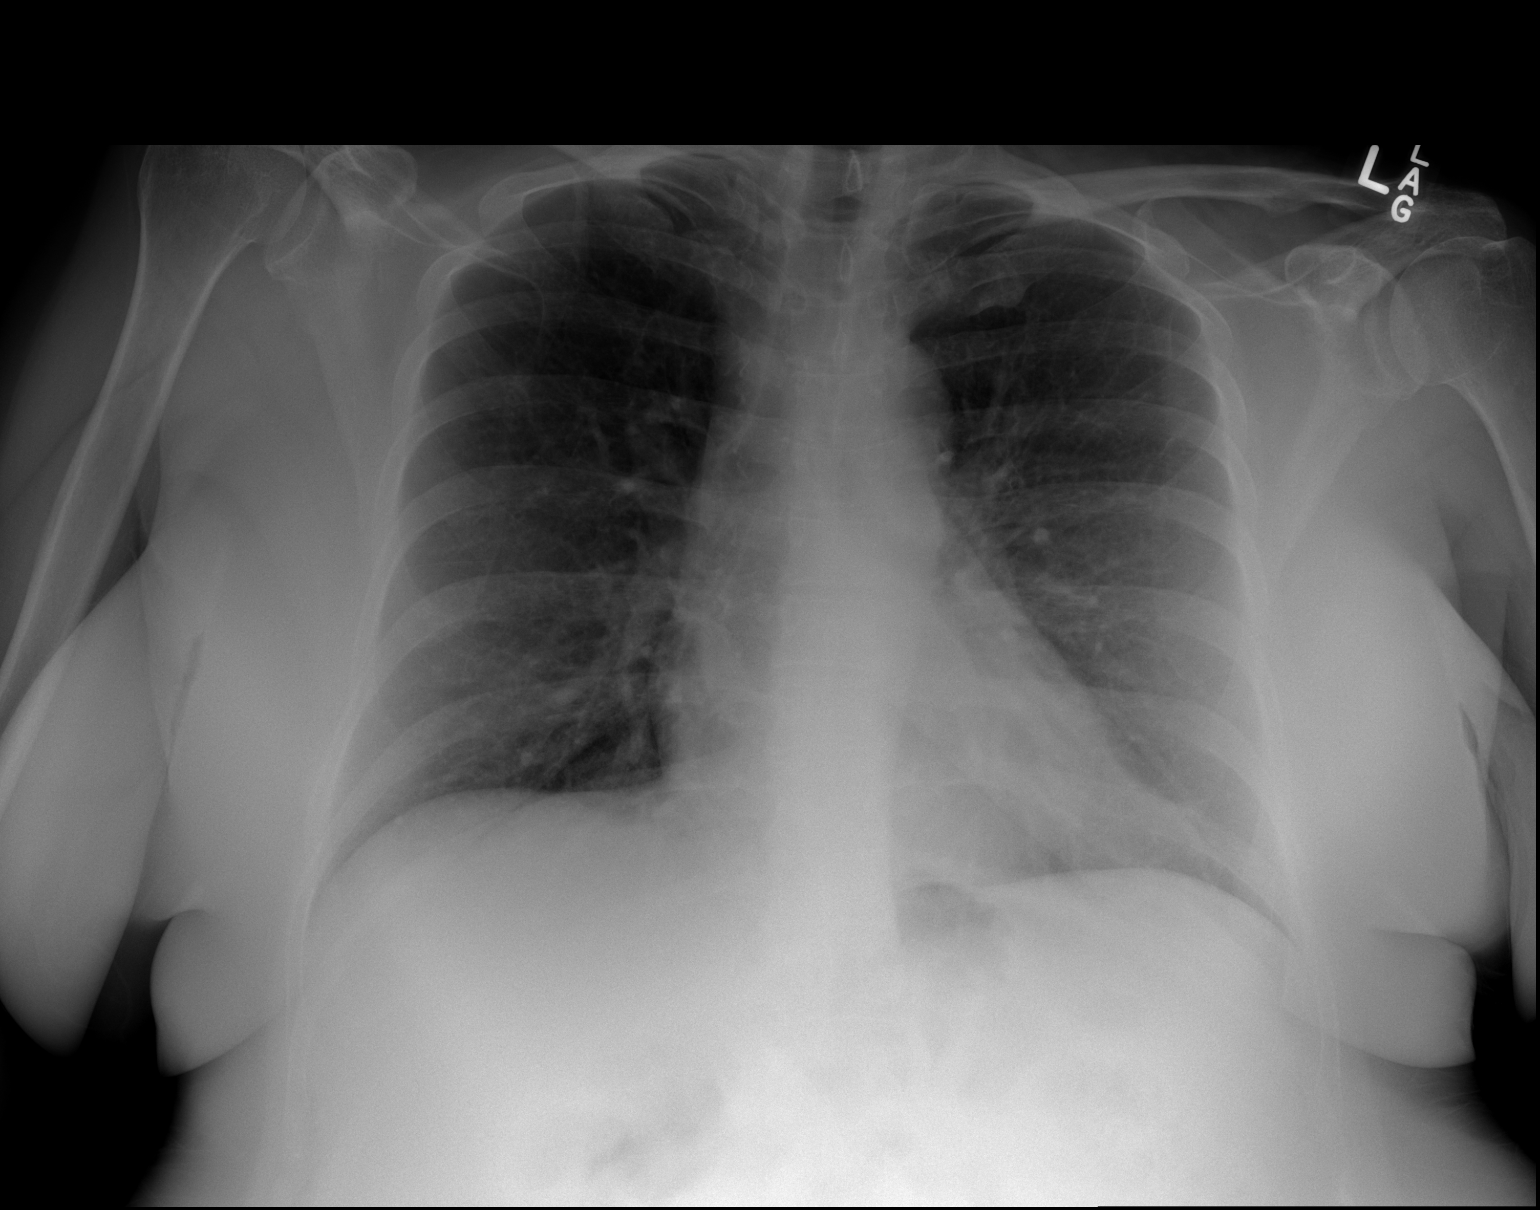

[2 of 2 positions shown; findings below may reference images not displayed]

FINDINGS: The heart size and mediastinal contours are within normal limits.
Both lungs are clear. The visualized skeletal structures are
unremarkable.
IMPRESSION: No active cardiopulmonary disease.

## 2016-09-01 MED ORDER — METHOCARBAMOL 500 MG PO TABS: 500.0000 mg | ORAL_TABLET | Freq: Two times a day (BID) | ORAL | 0 refills | Status: DC

## 2016-09-01 MED ORDER — MORPHINE SULFATE (PF) 4 MG/ML IV SOLN
4.0000 mg | Freq: Once | INTRAVENOUS | Status: AC
  Administered 2016-09-01: 4 mg via INTRAVENOUS
  Filled 2016-09-01: qty 1

## 2016-09-01 MED ORDER — PROMETHAZINE HCL 25 MG PO TABS: 25.0000 mg | ORAL_TABLET | Freq: Four times a day (QID) | ORAL | 0 refills | Status: DC | PRN

## 2016-09-01 MED ORDER — SODIUM CHLORIDE 0.9 % IV BOLUS (SEPSIS)
1000.0000 mL | Freq: Once | INTRAVENOUS | Status: AC
  Administered 2016-09-01: 1000 mL via INTRAVENOUS

## 2016-09-01 MED ORDER — DIPHENOXYLATE-ATROPINE 2.5-0.025 MG PO TABS: 2.0000 | ORAL_TABLET | Freq: Four times a day (QID) | ORAL | 0 refills | Status: DC | PRN

## 2016-09-01 MED ORDER — PROMETHAZINE HCL 25 MG/ML IJ SOLN
25.0000 mg | Freq: Once | INTRAMUSCULAR | Status: AC
  Administered 2016-09-01: 25 mg via INTRAVENOUS
  Filled 2016-09-01: qty 1

## 2016-09-01 NOTE — ED Notes (Signed)
Pt states the ginger ale made her nauseous after one sip

## 2016-09-01 NOTE — ED Provider Notes (Signed)
[facility] Provider Note   CSN: [id number] Arrival date & time: 09/01/16  0358     History   Chief Complaint Chief Complaint  Patient presents with  . Emesis    HPI Kerry Young is a 51 y.o. female.  Patient presents with complaints of cough, chest congestion, sore throat, nausea, vomiting, diarrhea. Symptoms have been ongoing for 2 days. Patient reports that she feels very dehydrated. She also has been experiencing severe cough that has been intermittently productive of sputum. She now is having some spasms in her back when she coughs.      Past Medical History:  Diagnosis Date  . Anxiety   . Migraines     There are no active problems to display for this patient.   History reviewed. No pertinent surgical history.  OB History    No data available       Home Medications    Prior to Admission medications   Medication Sig Start Date End Date Taking? Authorizing Provider  ALPRAZolam Prudy Feeler(XANAX) 1 MG tablet Take 0.5-1 mg by mouth 2 (two) times daily as needed for anxiety.     Historical Provider, MD  fluticasone (FLONASE) 50 MCG/ACT nasal spray Place 2 sprays into both nostrils daily. 07/25/16   Rise MuKenneth T Leaphart, PA-C  hydrOXYzine (ATARAX/VISTARIL) 25 MG tablet Take 1 tablet (25 mg total) by mouth every 6 (six) hours. 08/18/16   Gilda Creasehristopher J Terease Marcotte, MD  ibuprofen (ADVIL,MOTRIN) 800 MG tablet Take 1 tablet (800 mg total) by mouth 3 (three) times daily. 08/18/16   Gilda Creasehristopher J Kellon Chalk, MD  promethazine (PHENERGAN) 25 MG tablet Take 1 tablet (25 mg total) by mouth every 6 (six) hours as needed for nausea or vomiting. 08/18/16   Gilda Creasehristopher J Gesenia Bantz, MD  traMADol (ULTRAM) 50 MG tablet Take 1 tablet (50 mg total) by mouth every 6 (six) hours as needed. 08/18/16   Gilda Creasehristopher J Varshini Arrants, MD    Family History History reviewed. No pertinent family history.  Social History Social History  Substance Use Topics  . Smoking status: Current Every Day Smoker    Packs/day:  0.50    Types: Cigarettes  . Smokeless tobacco: Never Used  . Alcohol use No     Comment: occ     Allergies   Other; Percocet [oxycodone-acetaminophen]; and Zofran [ondansetron hcl]   Review of Systems Review of Systems  HENT: Positive for congestion and sore throat.   Gastrointestinal: Positive for diarrhea, nausea and vomiting.  All other systems reviewed and are negative.    Physical Exam Updated Vital Signs BP 114/88 (BP Location: Right Arm)   Pulse 107   Temp 98.7 F (37.1 C) (Oral)   Resp 22   SpO2 98%   Physical Exam  Constitutional: She is oriented to person, place, and time. She appears well-developed and well-nourished. No distress.  HENT:  Head: Normocephalic and atraumatic.  Right Ear: Hearing normal.  Left Ear: Hearing normal.  Nose: Nose normal.  Mouth/Throat: Oropharynx is clear and moist and mucous membranes are normal.  Eyes: Conjunctivae and EOM are normal. Pupils are equal, round, and reactive to light.  Neck: Normal range of motion. Neck supple.  Cardiovascular: Regular rhythm, S1 normal and S2 normal.  Exam reveals no gallop and no friction rub.   No murmur heard. Pulmonary/Chest: Effort normal and breath sounds normal. No respiratory distress. She exhibits no tenderness.  Abdominal: Soft. Normal appearance and bowel sounds are normal. There is no hepatosplenomegaly. There is no tenderness. There is  no rebound, no guarding, no tenderness at McBurney's point and negative Murphy's sign. No hernia.  Musculoskeletal: Normal range of motion.  Neurological: She is alert and oriented to person, place, and time. She has normal strength. No cranial nerve deficit or sensory deficit. Coordination normal. GCS eye subscore is 4. GCS verbal subscore is 5. GCS motor subscore is 6.  Skin: Skin is warm, dry and intact. No rash noted. No cyanosis.  Psychiatric: Her speech is normal and behavior is normal. Thought content normal. Her mood appears anxious.  Nursing  note and vitals reviewed.    ED Treatments / Results  Labs (all labs ordered are listed, but only abnormal results are displayed) Labs Reviewed  COMPREHENSIVE METABOLIC PANEL - Abnormal; Notable for the following:       Result Value   CO2 19 (*)    Glucose, Bld 117 (*)    All other components within normal limits  CBC WITH DIFFERENTIAL/PLATELET    EKG  EKG Interpretation None       Radiology No results found.  Procedures Procedures (including critical care time)  Medications Ordered in ED Medications  morphine 4 MG/ML injection 4 mg (not administered)  sodium chloride 0.9 % bolus 1,000 mL (1,000 mLs Intravenous New Bag/Given 09/01/16 0431)  promethazine (PHENERGAN) injection 25 mg (25 mg Intravenous Given 09/01/16 0431)     Initial Impression / Assessment and Plan / ED Course  I have reviewed the triage vital signs and the nursing notes.  Pertinent labs & imaging results that were available during my care of the patient were reviewed by me and considered in my medical decision making (see chart for details).     Patient presents with complaints of nausea, vomiting, diarrhea with upper respiratory infection symptoms. His compressive been ongoing for several days. Presentation consistent with influenza or viral process. Patient reports feeling very dehydrated. Lab work is unremarkable. She was given IV fluids and symptomatic treatment.  Final Clinical Impressions(s) / ED Diagnoses   Final diagnoses:  Nausea vomiting and diarrhea  Upper respiratory tract infection, unspecified type    New Prescriptions New Prescriptions   No medications on file     [name], MD 09/01/16 0502

## 2016-09-01 NOTE — ED Notes (Signed)
Patient requesting pain medication prior to chest xray. EDP notified

## 2016-09-01 NOTE — ED Notes (Signed)
Patient verbalizes understanding of discharge instructions, prescriptions, home care and follow up care. Patient out of department at this time. 

## 2016-09-01 NOTE — ED Triage Notes (Signed)
Pt complains of vomiting and diarrhea since Sunday morning, she states that it started with a itch in her throat and then a cough and then the vomiting and diarrhea

## 2016-09-09 ENCOUNTER — Encounter (HOSPITAL_COMMUNITY): Payer: Self-pay | Admitting: *Deleted

## 2016-09-09 ENCOUNTER — Emergency Department (HOSPITAL_COMMUNITY): Payer: Self-pay

## 2016-09-09 ENCOUNTER — Emergency Department (HOSPITAL_COMMUNITY)
Admission: EM | Admit: 2016-09-09 | Discharge: 2016-09-09 | Disposition: A | Discharge status: Home / Self Care | Payer: Self-pay | Attending: Emergency Medicine | Admitting: Emergency Medicine

## 2016-09-09 DIAGNOSIS — R51 Headache: Secondary | ICD-10-CM | POA: Insufficient documentation

## 2016-09-09 DIAGNOSIS — Z79899 Other long term (current) drug therapy: Secondary | ICD-10-CM | POA: Insufficient documentation

## 2016-09-09 DIAGNOSIS — G8929 Other chronic pain: Secondary | ICD-10-CM | POA: Insufficient documentation

## 2016-09-09 DIAGNOSIS — R519 Headache, unspecified: Secondary | ICD-10-CM

## 2016-09-09 DIAGNOSIS — F17210 Nicotine dependence, cigarettes, uncomplicated: Secondary | ICD-10-CM | POA: Insufficient documentation

## 2016-09-09 DIAGNOSIS — M5442 Lumbago with sciatica, left side: Secondary | ICD-10-CM | POA: Insufficient documentation

## 2016-09-09 LAB — COMPREHENSIVE METABOLIC PANEL
ALBUMIN: 3.6 g/dL (ref 3.5–5.0)
ALT: 35 U/L (ref 14–54)
ANION GAP: 11 (ref 5–15)
AST: 26 U/L (ref 15–41)
Alkaline Phosphatase: 65 U/L (ref 38–126)
BUN: 12 mg/dL (ref 6–20)
CHLORIDE: 110 mmol/L (ref 101–111)
CO2: 20 mmol/L — ABNORMAL LOW (ref 22–32)
Calcium: 9 mg/dL (ref 8.9–10.3)
Creatinine, Ser: 0.7 mg/dL (ref 0.44–1.00)
GFR calc Af Amer: >60 mL/min (ref >60)
GFR calc non Af Amer: >60 mL/min (ref >60)
GLUCOSE: 95 mg/dL (ref 65–99)
POTASSIUM: 4.1 mmol/L (ref 3.5–5.1)
SODIUM: 141 mmol/L (ref 135–145)
TOTAL PROTEIN: 6.4 g/dL — AB (ref 6.5–8.1)
Total Bilirubin: 0.5 mg/dL (ref 0.3–1.2)

## 2016-09-09 LAB — CBC WITH DIFFERENTIAL/PLATELET
BASOS ABS: 0.1 10*3/uL (ref 0.0–0.1)
BASOS PCT: 1 %
EOS ABS: 0.3 10*3/uL (ref 0.0–0.7)
EOS PCT: 4 %
HCT: 41.5 % (ref 36.0–46.0)
Hemoglobin: 13.8 g/dL (ref 12.0–15.0)
Lymphocytes Relative: 32 %
Lymphs Abs: 2.5 10*3/uL (ref 0.7–4.0)
MCH: 29.3 pg (ref 26.0–34.0)
MCHC: 33.3 g/dL (ref 30.0–36.0)
MCV: 88.1 fL (ref 78.0–100.0)
MONO ABS: 0.7 10*3/uL (ref 0.1–1.0)
MONOS PCT: 10 %
NEUTROS ABS: 4.1 10*3/uL (ref 1.7–7.7)
Neutrophils Relative %: 53 %
PLATELETS: 322 10*3/uL (ref 150–400)
RBC: 4.71 MIL/uL (ref 3.87–5.11)
RDW: 12.8 % (ref 11.5–15.5)
WBC: 7.6 10*3/uL (ref 4.0–10.5)

## 2016-09-09 LAB — URINALYSIS, ROUTINE W REFLEX MICROSCOPIC
BILIRUBIN URINE: NEGATIVE
Glucose, UA: NEGATIVE mg/dL
Hgb urine dipstick: NEGATIVE
KETONES UR: NEGATIVE mg/dL
LEUKOCYTES UA: NEGATIVE
NITRITE: NEGATIVE
PH: 5 (ref 5.0–8.0)
Protein, ur: NEGATIVE mg/dL
SPECIFIC GRAVITY, URINE: 1.026 (ref 1.005–1.030)

## 2016-09-09 LAB — I-STAT CG4 LACTIC ACID, ED: Lactic Acid, Venous: 1.87 mmol/L (ref 0.5–1.9)

## 2016-09-09 LAB — POC URINE PREG, ED: PREG TEST UR: NEGATIVE

## 2016-09-09 LAB — LIPASE, BLOOD: Lipase: 39 U/L (ref 11–51)

## 2016-09-09 IMAGING — CR DG CHEST 2V
3 series · 3 of 3 positions shown · non-contrast
Comparison: [DATE] and earlier.

CLINICAL DATA: 50-year-old female with cough congestion and
headache for 10 days. Initial encounter. Smoker.

EXAM:
CHEST  2 VIEW

[chest pa]
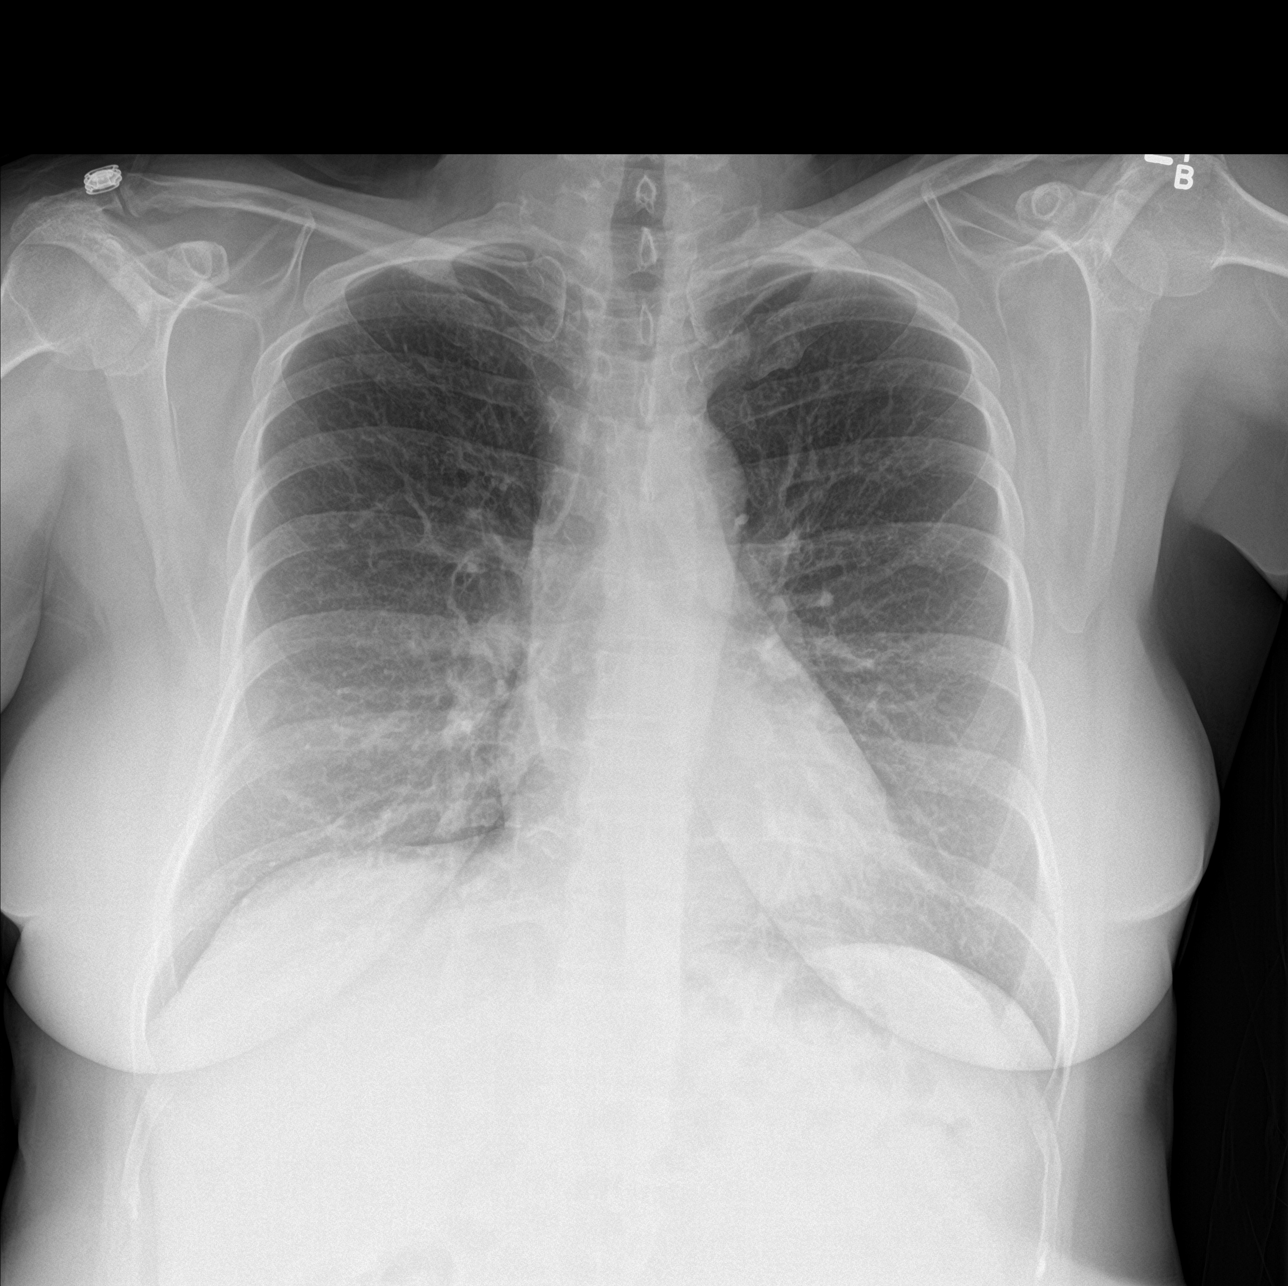

[chest lat (1 of 2)]
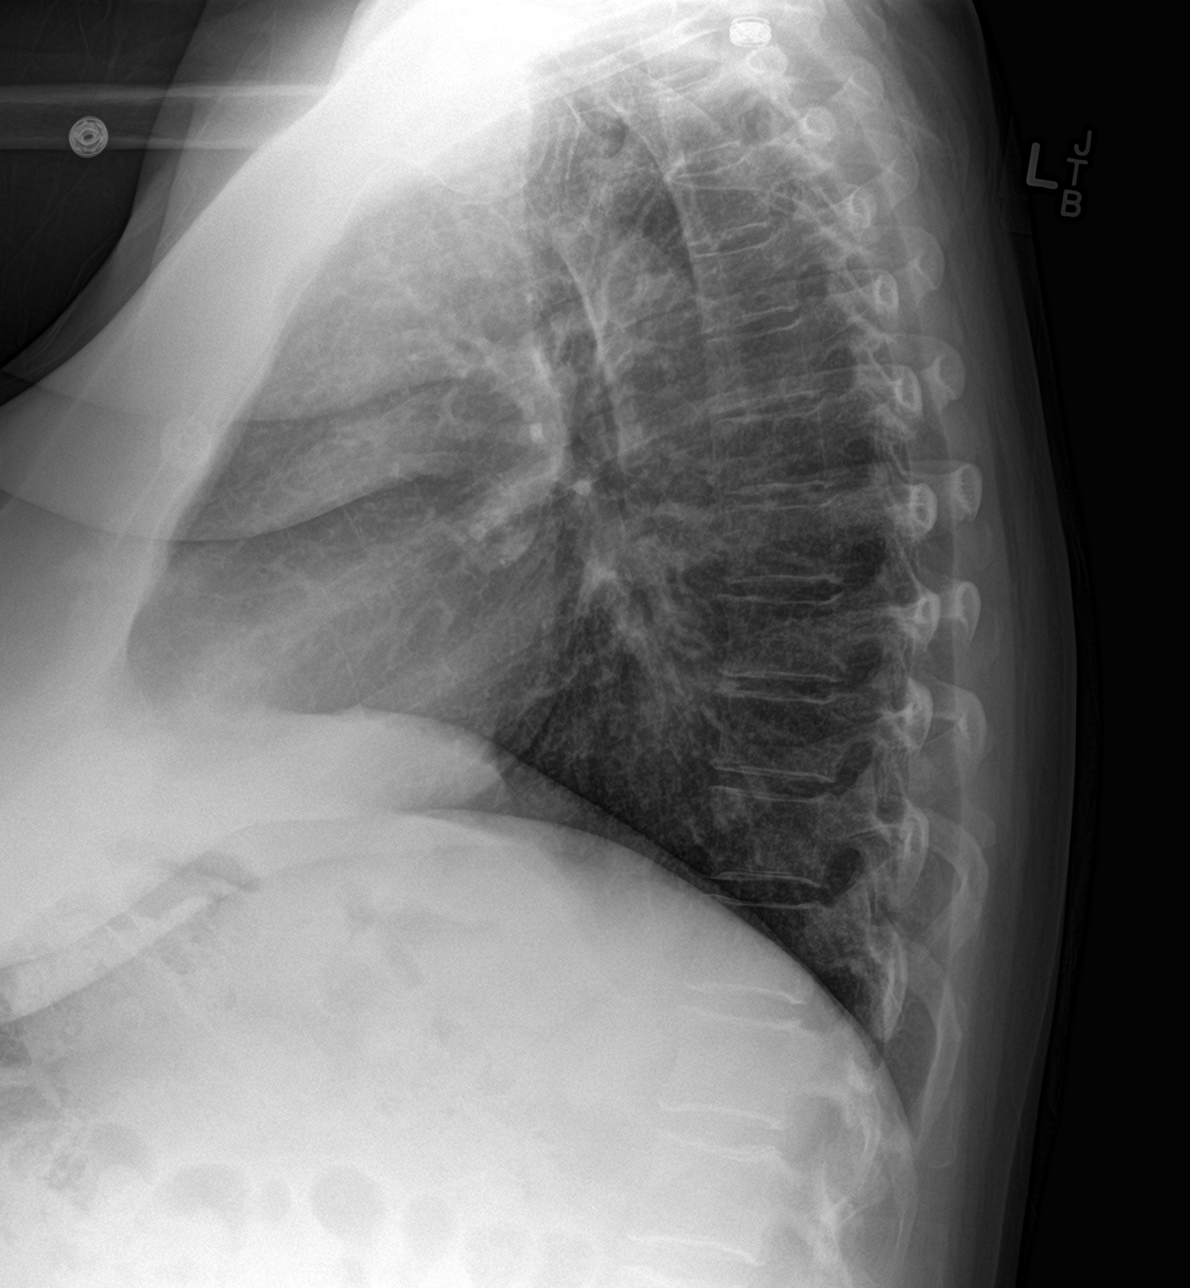

[chest lat (2 of 2)]
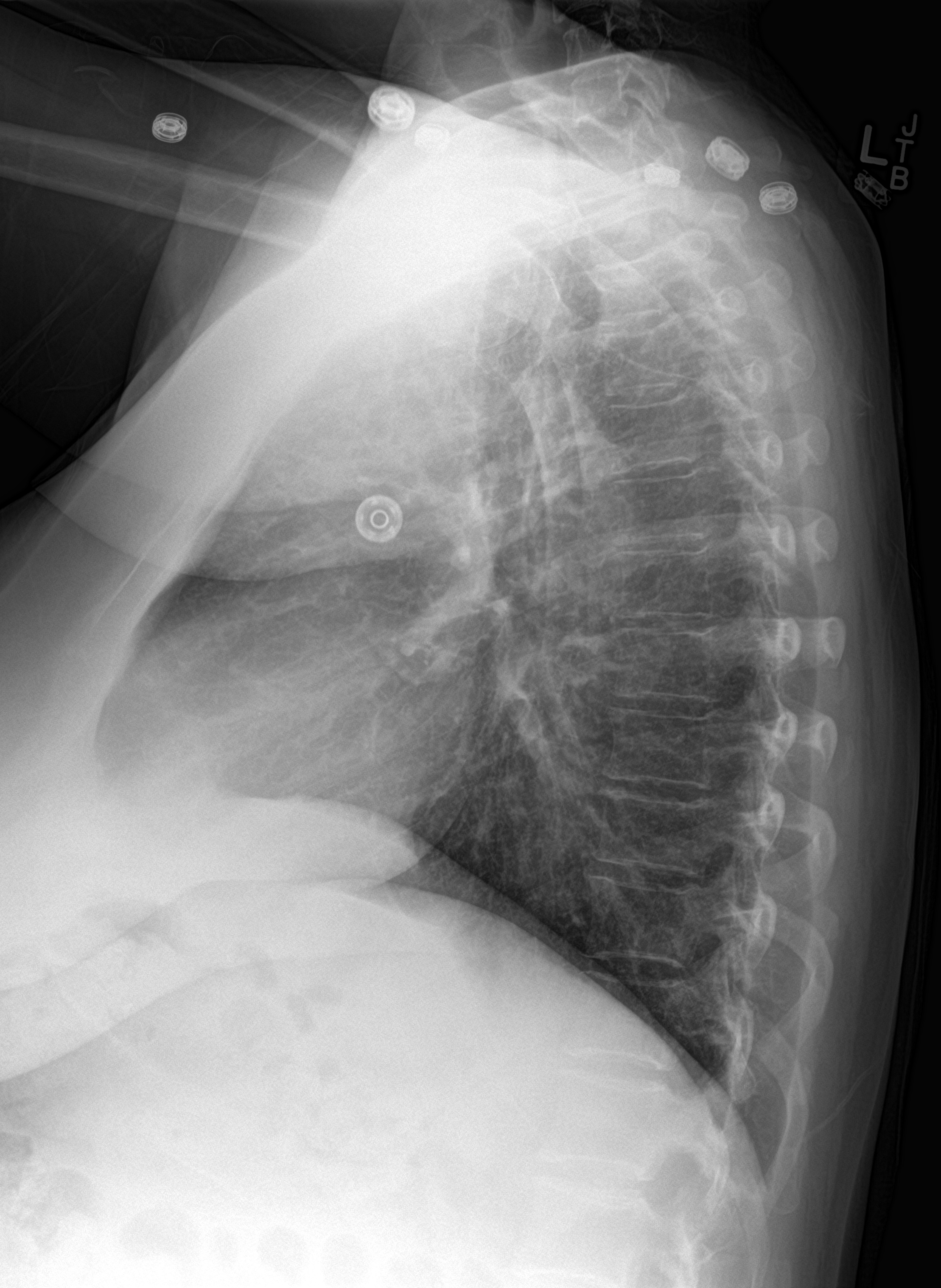

[3 of 3 positions shown; findings below may reference images not displayed]

FINDINGS: Stable lung volumes. Mediastinal contours remain normal. Visualized
tracheal air column is within normal limits. No pneumothorax,
pulmonary edema, pleural effusion or confluent pulmonary opacity.
Mild chronic increased interstitial markings appear stable. No acute
osseous abnormality identified. Negative visible bowel gas pattern.
IMPRESSION: No acute cardiopulmonary abnormality.

## 2016-09-09 MED ORDER — SODIUM CHLORIDE 0.9 % IV BOLUS (SEPSIS)
1000.0000 mL | Freq: Once | INTRAVENOUS | Status: AC
  Administered 2016-09-09: 1000 mL via INTRAVENOUS

## 2016-09-09 MED ORDER — METOCLOPRAMIDE HCL 5 MG/ML IJ SOLN
10.0000 mg | Freq: Once | INTRAMUSCULAR | Status: AC
  Administered 2016-09-09: 10 mg via INTRAVENOUS
  Filled 2016-09-09: qty 2

## 2016-09-09 MED ORDER — PROMETHAZINE HCL 25 MG PO TABS: 25.0000 mg | ORAL_TABLET | Freq: Four times a day (QID) | ORAL | 0 refills | Status: DC | PRN

## 2016-09-09 MED ORDER — CYCLOBENZAPRINE HCL 10 MG PO TABS: 10.0000 mg | ORAL_TABLET | Freq: Two times a day (BID) | ORAL | 0 refills | Status: DC | PRN

## 2016-09-09 MED ORDER — DIPHENHYDRAMINE HCL 50 MG/ML IJ SOLN
25.0000 mg | Freq: Once | INTRAMUSCULAR | Status: AC
  Administered 2016-09-09: 25 mg via INTRAVENOUS
  Filled 2016-09-09: qty 1

## 2016-09-09 NOTE — ED Notes (Signed)
Pt oob to br with steady gait 

## 2016-09-09 NOTE — Discharge Instructions (Signed)
Please follow-up with your PCP for further management of your headaches and back pain. If symptoms worsen, please return to the nearest ED.

## 2016-09-09 NOTE — ED Provider Notes (Signed)
[facility]-EMERGENCY DEPT Provider Note   CSN: [id number] Arrival date & time: 09/09/16  5621     History   Chief Complaint Chief Complaint  Patient presents with  . Migraine    HPI Kerry Young is a 51 y.o. female with a past medical history significant for migraines and anxiety who presents with nausea, vomiting, diarrhea, rhinorrhea, congestion, cough, and headache. Patient reports that for the last week and a half, she has been having symptoms including nausea, vomiting, diarrhea. She reports that she came to the ED last week and was given Phenergan which has helped her symptoms are mainly. She says she has not vomited today but has had continued nausea and diarrhea. She has been eating and drinking as well and feels that she might be dehydrated as she is constantly thirsty. She says she has a long history of migraines requiring ED visits for cocktails. She says that her headache has been ongoing since her last visit and is typical for migraines. She experiences associated photophobia. She says the pain is across the top of her head and is moderate to severe. She says her eye (Tylenol has not helped at home. She says that she has been having no trouble with urination including note frequency, dysuria, or hematuria. She says that she is having continued watery diarrhea but no blood in her bowel movements. She denies any vaginal discharge and vaginal bleeding or other complaints. She denies any abdominal pain, chest pain, or palpitations. She reports a cough with a productive white sputum. She is that she has felt lightheaded as she has been feeling very thirsty. She reports that she's been having some chronic left low back pain that is consistent with her sciatic pain. No changes with this.   HPI  Past Medical History:  Diagnosis Date  . Anxiety   . Migraines     There are no active problems to display for this patient.   History reviewed. No pertinent surgical history.  OB History    No data available       Home Medications    Prior to Admission medications   Medication Sig Start Date End Date Taking? Authorizing Provider  acetaminophen (TYLENOL) 500 MG tablet Take 1,000 mg by mouth every 6 (six) hours as needed for moderate pain.    Historical Provider, MD  ALPRAZolam Prudy Feeler) 1 MG tablet Take 0.5-1 mg by mouth 2 (two) times daily as needed for anxiety.     Historical Provider, MD  diphenoxylate-atropine (LOMOTIL) 2.5-0.025 MG tablet Take 2 tablets by mouth 4 (four) times daily as needed for diarrhea or loose stools. 09/01/16   [name], MD  fluticasone (FLONASE) 50 MCG/ACT nasal spray Place 2 sprays into both nostrils daily. Patient not taking: Reported on 09/01/2016 07/25/16   Rise Mu, PA-C  hydrOXYzine (ATARAX/VISTARIL) 25 MG tablet Take 1 tablet (25 mg total) by mouth every 6 (six) hours. Patient not taking: Reported on 09/01/2016 08/18/16   [name], MD  ibuprofen (ADVIL,MOTRIN) 800 MG tablet Take 1 tablet (800 mg total) by mouth 3 (three) times daily. 08/18/16   [name], MD  methocarbamol (ROBAXIN) 500 MG tablet Take 1 tablet (500 mg total) by mouth 2 (two) times daily. 09/01/16   [name], MD  Phenyleph-Doxylamine-DM-APAP (ALKA-SELTZER PLS NIGHT CLD/FLU PO) Take 2 capsules by mouth every 6 (six) hours as needed (cold).    Historical Provider, MD  promethazine (PHENERGAN) 25 MG tablet Take 1 tablet (25 mg total)  by mouth every 6 (six) hours as needed for nausea or vomiting. 08/18/16   Gilda Creasehristopher J Pollina, MD  promethazine (PHENERGAN) 25 MG tablet Take 1 tablet (25 mg total) by mouth every 6 (six) hours as needed for nausea or vomiting. 09/01/16   Gilda Creasehristopher J Pollina, MD  Sertraline HCl (ZOLOFT PO) Take 1 tablet by mouth daily.    Historical Provider, MD  traMADol (ULTRAM) 50 MG tablet Take 1 tablet (50 mg total) by mouth every 6 (six) hours as needed. Patient not taking: Reported on 09/01/2016 08/18/16    Gilda Creasehristopher J Pollina, MD    Family History No family history on file.  Social History Social History  Substance Use Topics  . Smoking status: Current Every Day Smoker    Packs/day: 0.50    Types: Cigarettes  . Smokeless tobacco: Never Used  . Alcohol use No     Comment: occ     Allergies   Other; Percocet [oxycodone-acetaminophen]; and Zofran [ondansetron hcl]   Review of Systems Review of Systems  Constitutional: Positive for chills and fatigue. Negative for diaphoresis and fever.  HENT: Positive for congestion and rhinorrhea.   Eyes: Positive for photophobia. Negative for visual disturbance.  Respiratory: Positive for cough. Negative for chest tightness, shortness of breath, wheezing and stridor.   Cardiovascular: Negative for chest pain, palpitations and leg swelling.  Gastrointestinal: Positive for diarrhea, nausea and vomiting. Negative for abdominal pain and constipation.  Genitourinary: Negative for decreased urine volume, dysuria and flank pain.  Musculoskeletal: Positive for back pain. Negative for neck pain and neck stiffness.  Neurological: Positive for light-headedness. Negative for seizures, syncope, numbness and headaches.  Psychiatric/Behavioral: Negative for behavioral problems and confusion.  All other systems reviewed and are negative.    Physical Exam Updated Vital Signs BP 122/83 (BP Location: Left Arm)   Pulse 92   Temp 98.6 F (37 C) (Oral)   Resp 14   SpO2 98%   Physical Exam  Constitutional: She appears well-developed and well-nourished. No distress.  HENT:  Head: Normocephalic and atraumatic.  Right Ear: External ear normal.  Left Ear: External ear normal.  Mouth/Throat: Oropharynx is clear and moist. No oropharyngeal exudate.  Eyes: Conjunctivae and EOM are normal.  Neck: Normal range of motion. Neck supple.  Cardiovascular: Normal rate, regular rhythm and normal heart sounds.   No murmur heard. Pulmonary/Chest: Effort normal and  breath sounds normal. No stridor. No respiratory distress. She has no wheezes. She has no rales. She exhibits no tenderness.  Abdominal: Soft. There is no tenderness.  Musculoskeletal: She exhibits tenderness. She exhibits no edema.       Lumbar back: She exhibits tenderness and pain. She exhibits no laceration.       Back:  Neurological: She is alert. She is not disoriented. No cranial nerve deficit or sensory deficit. She exhibits normal muscle tone. GCS eye subscore is 4. GCS verbal subscore is 5. GCS motor subscore is 6.  Skin: Skin is warm and dry. No rash noted. No erythema.  Psychiatric: She has a normal mood and affect.  Nursing note and vitals reviewed.    ED Treatments / Results  Labs (all labs ordered are listed, but only abnormal results are displayed) Labs Reviewed  COMPREHENSIVE METABOLIC PANEL - Abnormal; Notable for the following:       Result Value   CO2 20 (*)    Total Protein 6.4 (*)    All other components within normal limits  URINALYSIS, ROUTINE W REFLEX MICROSCOPIC -  Abnormal; Notable for the following:    APPearance HAZY (*)    All other components within normal limits  CBC WITH DIFFERENTIAL/PLATELET  LIPASE, BLOOD  I-STAT CG4 LACTIC ACID, ED  POC URINE PREG, ED    EKG  EKG Interpretation None       Radiology Dg Chest 2 View  Result Date: 09/09/2016 CLINICAL DATA:  51 year old female with cough congestion and headache for 10 days. Initial encounter. Smoker. EXAM: CHEST  2 VIEW COMPARISON:  09/01/2016 and earlier. FINDINGS: Stable lung volumes. Mediastinal contours remain normal. Visualized tracheal air column is within normal limits. No pneumothorax, pulmonary edema, pleural effusion or confluent pulmonary opacity. Mild chronic increased interstitial markings appear stable. No acute osseous abnormality identified. Negative visible bowel gas pattern. IMPRESSION: No acute cardiopulmonary abnormality. Electronically Signed   By: Odessa Fleming M.D.   On:  09/09/2016 09:27    Procedures Procedures (including critical care time)  Medications Ordered in ED Medications  sodium chloride 0.9 % bolus 1,000 mL (0 mLs Intravenous Stopped 09/09/16 1453)  sodium chloride 0.9 % bolus 1,000 mL (0 mLs Intravenous Stopped 09/09/16 1119)  metoCLOPramide (REGLAN) injection 10 mg (10 mg Intravenous Given 09/09/16 0944)  diphenhydrAMINE (BENADRYL) injection 25 mg (25 mg Intravenous Given 09/09/16 0940)     Initial Impression / Assessment and Plan / ED Course  I have reviewed the triage vital signs and the nursing notes.  Pertinent labs & imaging results that were available during my care of the patient were reviewed by me and considered in my medical decision making (see chart for details).     Kerry Young is a 51 y.o. female with a past medical history significant for migraines and anxiety who presents with nausea, vomiting, diarrhea, rhinorrhea, congestion, cough, and headache.  History and exam are seen above.   On exam, patient has tenderness in her left low back. No CVA tenderness. No focal neurologic deficits. Abdomen nontender. Lungs clear. Chest nontender. Photophobia present. No other abnormalities on exam.  Patient likely is dehydrated in the setting of nausea, vomiting, diarrhea, and decreased by mouth intake. Patient given fluids and migraine cocktail to help her symptoms. Patient will have workup to look for electron abnormal maladies or occult infection including possible pneumonia or UTI.  Anticipate reassessment following workup.   No evidence of lab abnormality Or infection. No pneumonia.  On reassessment, headache is resolved. Patient continues to have chronic low back pain. Patient reports that she is out of  Her previously prescribed nausea medications. Patient will be given prescription of Phenergan.  Given likely muscular cause of back pain, patient will be given prescription for several doses of Flexeril. Patient will follow up  with PCP for further management of chronic back pain and headaches.  Final Clinical Impressions(s) / ED Diagnoses   Final diagnoses:  Bad headache  Chronic left-sided low back pain with left-sided sciatica    New Prescriptions Discharge Medication List as of 09/09/2016  3:06 PM    START taking these medications   Details  cyclobenzaprine (FLEXERIL) 10 MG tablet Take 1 tablet (10 mg total) by mouth 2 (two) times daily as needed for muscle spasms., Starting Wed 09/09/2016, Print    !! promethazine (PHENERGAN) 25 MG tablet Take 1 tablet (25 mg total) by mouth every 6 (six) hours as needed for nausea or vomiting., Starting Wed 09/09/2016, Print     !! - Potential duplicate medications found. Please discuss with provider.      Clinical Impression: 1.  Bad headache   2. Chronic left-sided low back pain with left-sided sciatica     Disposition: Discharge  Condition: Good  I have discussed the results, Dx and Tx plan with the pt(& family if present). He/she/they expressed understanding and agree(s) with the plan. Discharge instructions discussed at great length. Strict return precautions discussed and pt &/or family have verbalized understanding of the instructions. No further questions at time of discharge.    Discharge Medication List as of 09/09/2016  3:06 PM    START taking these medications   Details  cyclobenzaprine (FLEXERIL) 10 MG tablet Take 1 tablet (10 mg total) by mouth 2 (two) times daily as needed for muscle spasms., Starting Wed 09/09/2016, Print    !! promethazine (PHENERGAN) 25 MG tablet Take 1 tablet (25 mg total) by mouth every 6 (six) hours as needed for nausea or vomiting., Starting Wed 09/09/2016, Print     !! - Potential duplicate medications found. Please discuss with provider.      Follow Up: Ileana Ladd, MD [address] [place] [address] [phone]     [facility] EMERGENCY DEPARTMENT [address] [id number] mc [place] [address] [phone]       Heide Scales, MD 09/09/16 2055

## 2016-09-09 NOTE — ED Notes (Signed)
Pt would like an update as to how much longer it'll be. Pt states her back is hurting and she is starving.

## 2016-09-09 NOTE — ED Notes (Signed)
Patient transported to X-ray 

## 2016-09-09 NOTE — ED Triage Notes (Signed)
Pt c/o n/v/d last week, pt have improved, pt presents today for eval for headache, sensitivity to light, & excessive thirst, pt c/o chronic lower back pain, pt ambulatory, A&O x4

## 2016-09-09 NOTE — ED Notes (Signed)
Pt states she understaNDS INSTRUCTIONS. hOME STABLE WITH STEADY GAIT.

## 2016-11-10 ENCOUNTER — Encounter (HOSPITAL_COMMUNITY): Payer: Self-pay

## 2016-11-10 DIAGNOSIS — G43909 Migraine, unspecified, not intractable, without status migrainosus: Secondary | ICD-10-CM | POA: Insufficient documentation

## 2016-11-10 DIAGNOSIS — Z79899 Other long term (current) drug therapy: Secondary | ICD-10-CM | POA: Insufficient documentation

## 2016-11-10 DIAGNOSIS — M25561 Pain in right knee: Secondary | ICD-10-CM | POA: Insufficient documentation

## 2016-11-10 DIAGNOSIS — F17210 Nicotine dependence, cigarettes, uncomplicated: Secondary | ICD-10-CM | POA: Insufficient documentation

## 2016-11-10 NOTE — ED Triage Notes (Addendum)
Pt reports 10/10 HA pain w/ hx of migraines. Pt also reports right knee pain w/o traumatic injury. Pt denies recent heavy lifting. Pt A+OX4, speaking in complete sentences.

## 2016-11-11 ENCOUNTER — Emergency Department (HOSPITAL_COMMUNITY)
Admission: EM | Admit: 2016-11-11 | Discharge: 2016-11-11 | Disposition: A | Discharge status: Home / Self Care | Payer: Self-pay | Attending: Emergency Medicine | Admitting: Emergency Medicine

## 2016-11-11 ENCOUNTER — Emergency Department (HOSPITAL_COMMUNITY): Payer: Self-pay

## 2016-11-11 DIAGNOSIS — M25561 Pain in right knee: Secondary | ICD-10-CM

## 2016-11-11 DIAGNOSIS — G43009 Migraine without aura, not intractable, without status migrainosus: Secondary | ICD-10-CM

## 2016-11-11 IMAGING — CR DG KNEE COMPLETE 4+V*R*
5 series · 5 of 5 positions shown · non-contrast
Comparison: None.

CLINICAL DATA: Right knee pain over 3 weeks posteriorly and in the
patellar region. No known injury.

EXAM:
RIGHT KNEE - COMPLETE 4+ VIEW

[x knee ap right]
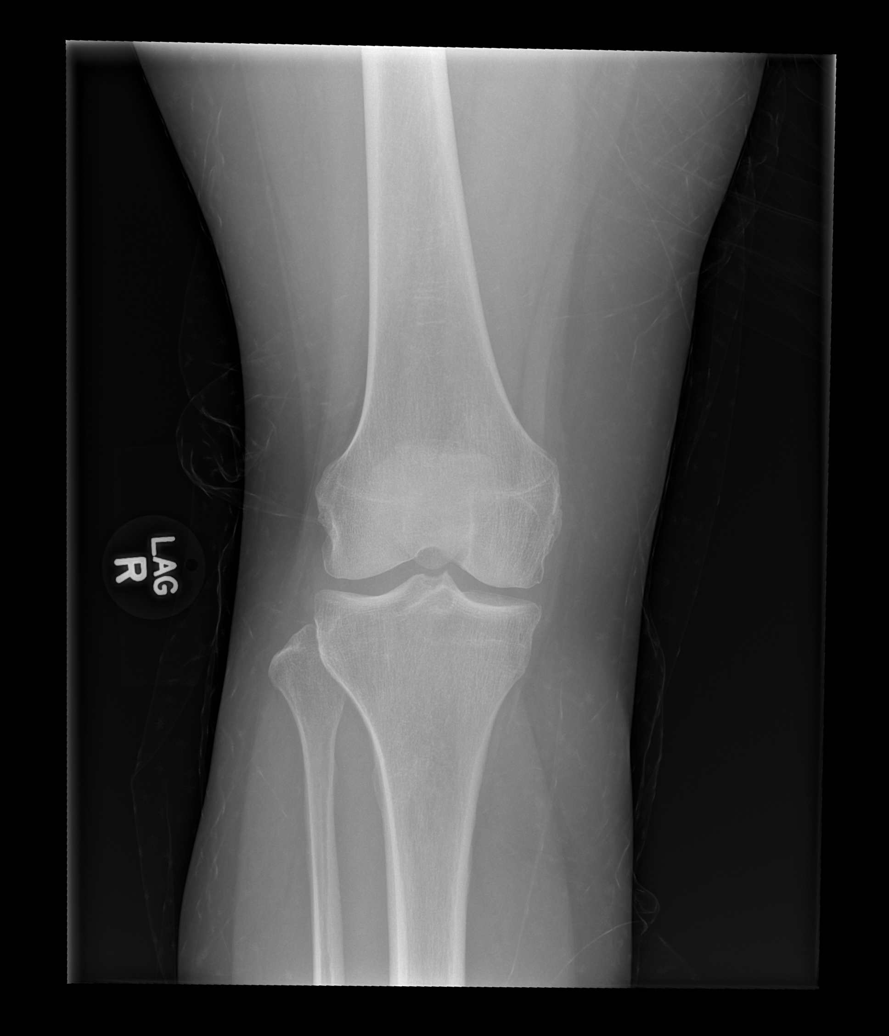

[x knee obl right (1 of 2)]
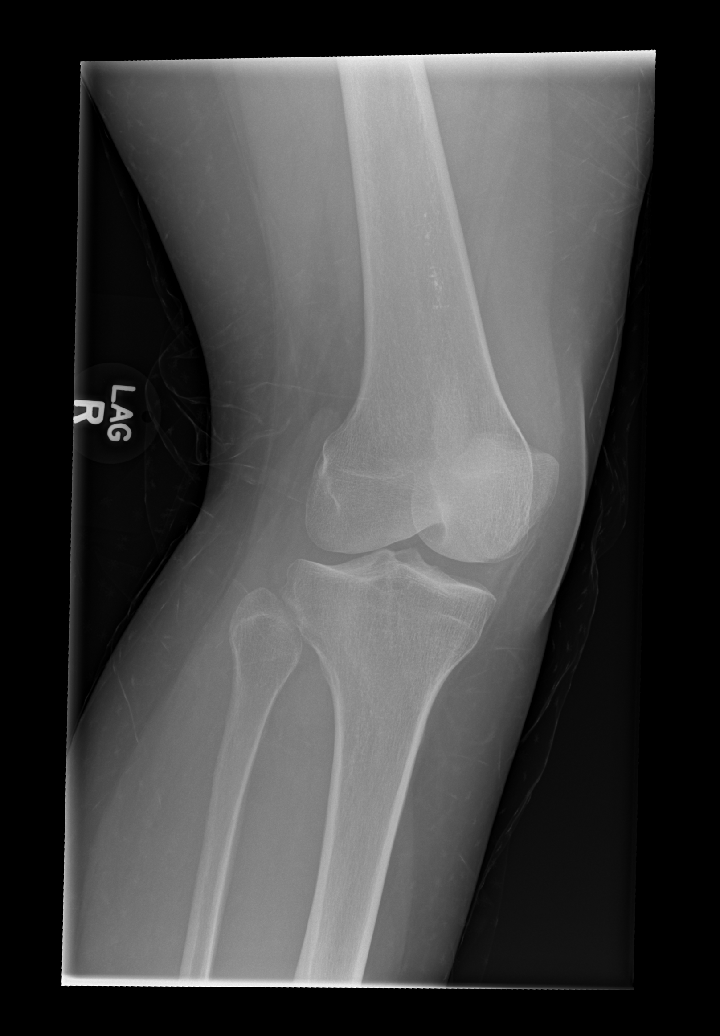

[x knee obl right (2 of 2)]
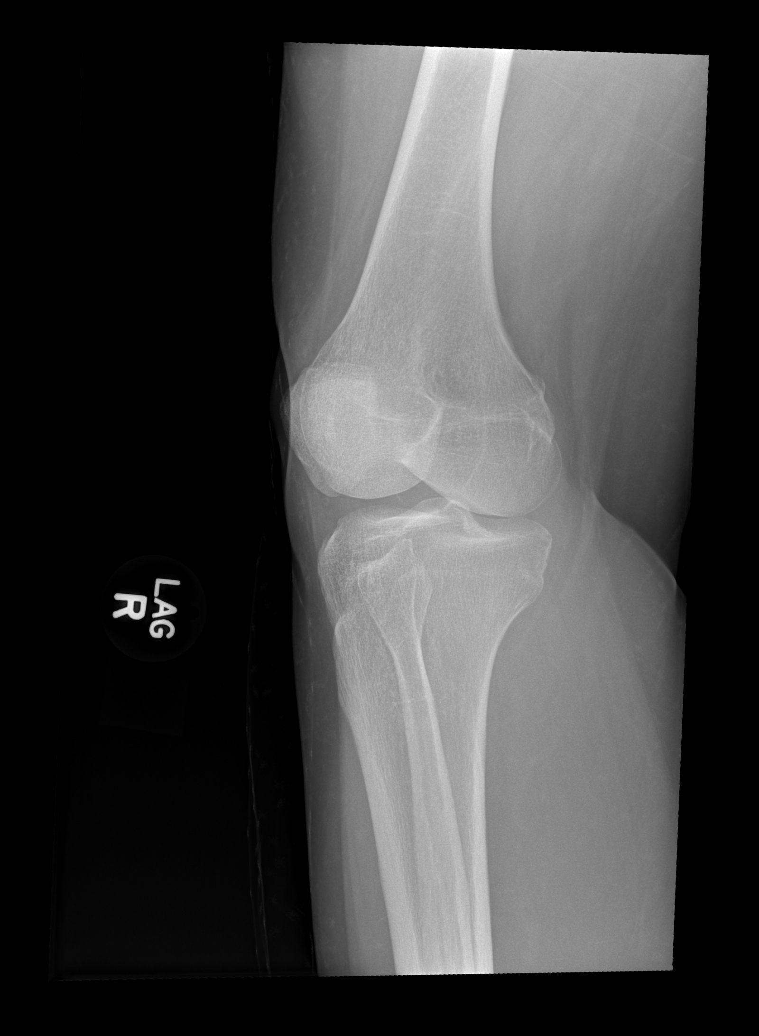

[x knee lat right (1 of 2)]
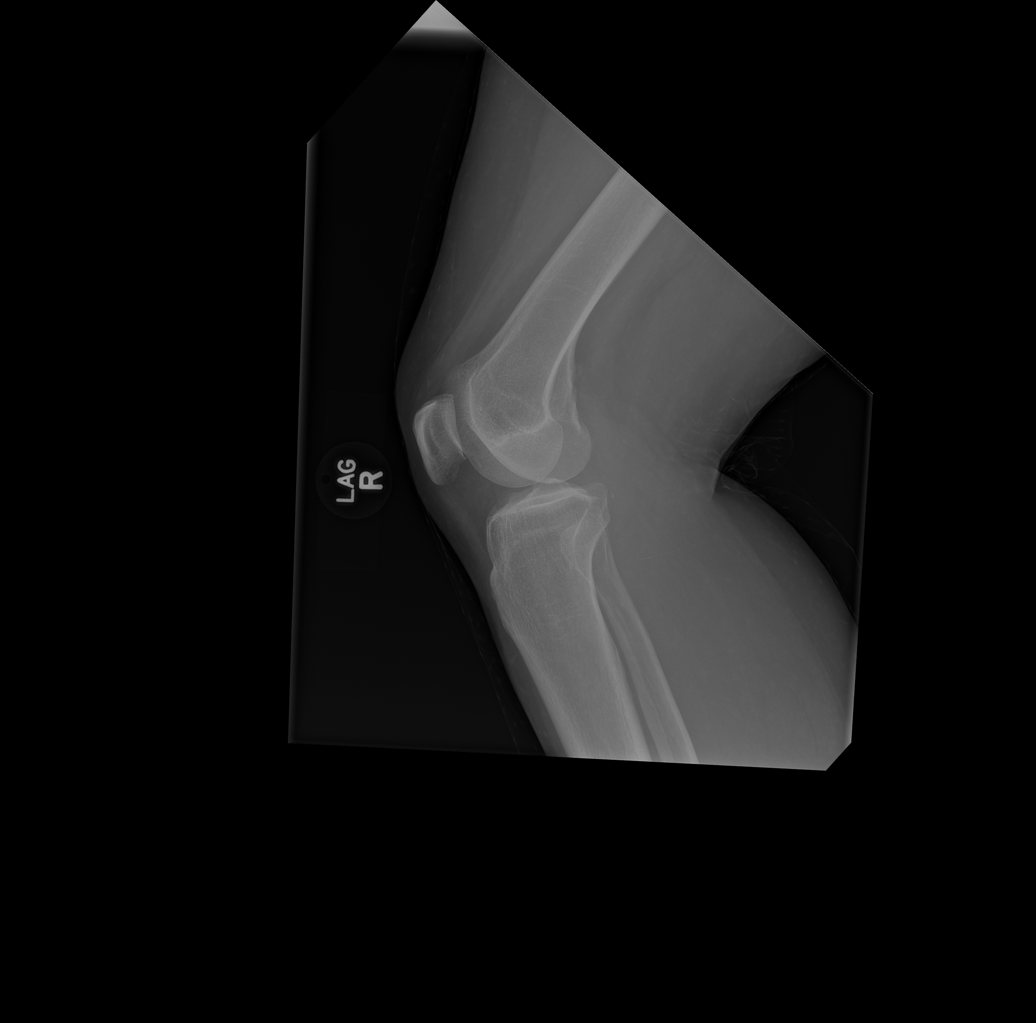

[x knee lat right (2 of 2)]
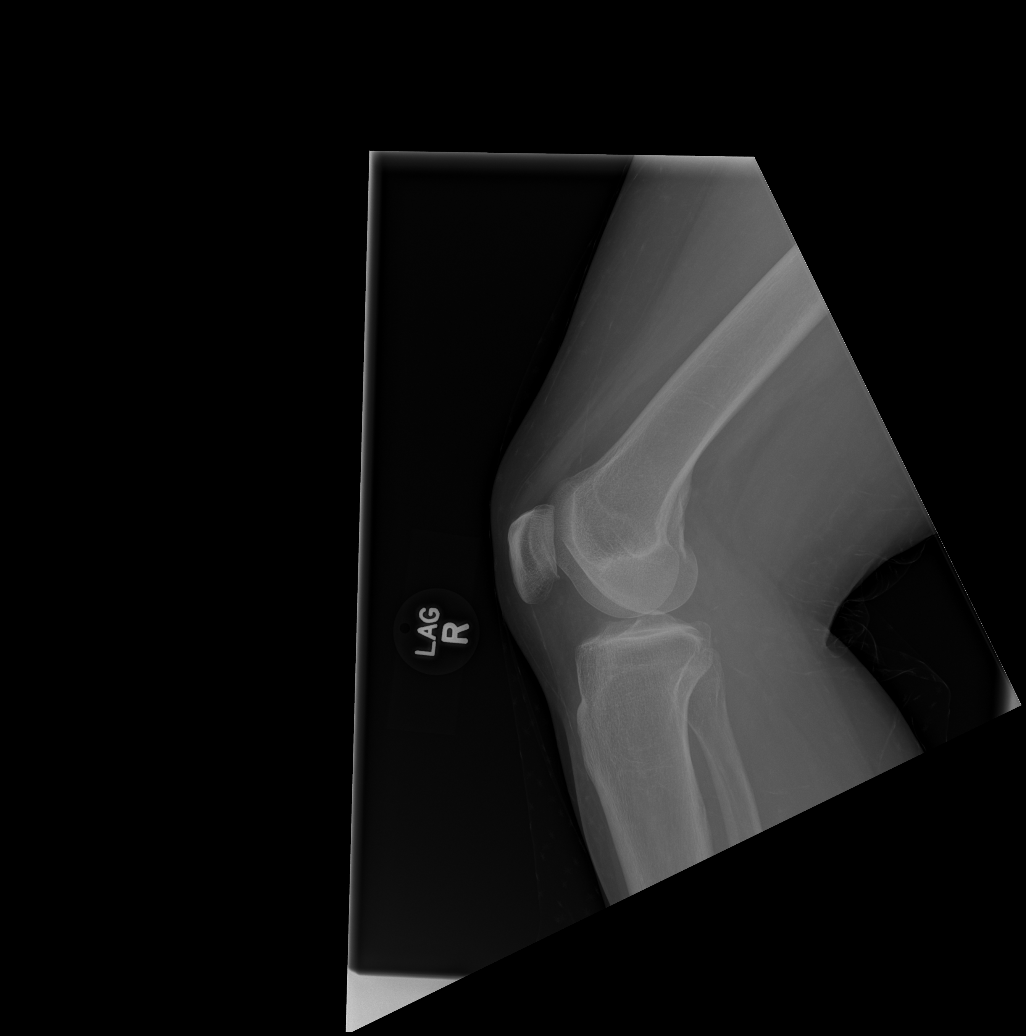

[5 of 5 positions shown; findings below may reference images not displayed]

FINDINGS: No evidence of fracture, dislocation, or joint effusion. No evidence
of arthropathy or other focal bone abnormality. Soft tissues are
unremarkable.
IMPRESSION: No acute osseous abnormality of the right knee.

## 2016-11-11 MED ORDER — PROCHLORPERAZINE EDISYLATE 5 MG/ML IJ SOLN
10.0000 mg | Freq: Once | INTRAMUSCULAR | Status: AC
  Administered 2016-11-11: 10 mg via INTRAVENOUS
  Filled 2016-11-11: qty 2

## 2016-11-11 MED ORDER — DIPHENHYDRAMINE HCL 50 MG/ML IJ SOLN
25.0000 mg | Freq: Once | INTRAMUSCULAR | Status: AC
  Administered 2016-11-11: 25 mg via INTRAVENOUS
  Filled 2016-11-11: qty 1

## 2016-11-11 MED ORDER — SODIUM CHLORIDE 0.9 % IV BOLUS (SEPSIS)
1000.0000 mL | Freq: Once | INTRAVENOUS | Status: AC
  Administered 2016-11-11: 1000 mL via INTRAVENOUS

## 2016-11-11 MED ORDER — BUTALBITAL-APAP-CAFFEINE 50-325-40 MG PO TABS
1.0000 | ORAL_TABLET | Freq: Four times a day (QID) | ORAL | 0 refills | Status: DC | PRN
Start: 2016-11-11 — End: 2017-04-25

## 2016-11-11 MED ORDER — KETOROLAC TROMETHAMINE 30 MG/ML IJ SOLN
30.0000 mg | Freq: Once | INTRAMUSCULAR | Status: AC
  Administered 2016-11-11: 30 mg via INTRAVENOUS
  Filled 2016-11-11: qty 1

## 2016-11-11 NOTE — Discharge Instructions (Signed)
Return here as needed. Follow up with the orthopedist provided.  °

## 2016-11-11 NOTE — ED Provider Notes (Signed)
[facility]-EMERGENCY DEPT Provider Note   CSN: [id number] Arrival date & time: 11/10/16  2314   By signing my name below, I, Thelma Barge, attest that this documentation has been prepared under the direction and in the presence of [employer], PA-C. Electronically Signed: Thelma Barge, Scribe. 11/11/16. 1:10 AM.  History   Chief Complaint Chief Complaint  Patient presents with  . Migraine  . Knee Pain    Right    The history is provided by the patient. No language interpreter was used.  HPI Comments: Kerry Young is a 51 y.o. female who presents to the Emergency Department complaining of a gradually worsening headache on her left side that started ~8 hours ago. She has associated nausea and vomiting.  She also complains of right knee pain x 3 weeks. Her pain worsens when she walks. She states her knee feels like it's going to "give away" on her and she describes it as "icy little needles that are stabbing" her knee. She denies swelling in the area.   Past Medical History:  Diagnosis Date  . Anxiety   . Migraines     There are no active problems to display for this patient.   History reviewed. No pertinent surgical history.  OB History    No data available       Home Medications    Prior to Admission medications   Medication Sig Start Date End Date Taking? Authorizing Provider  acetaminophen (TYLENOL) 500 MG tablet Take 1,000 mg by mouth every 6 (six) hours as needed for moderate pain.    Historical Provider, MD  ALPRAZolam Prudy Feeler) 1 MG tablet Take 0.5-1 mg by mouth 2 (two) times daily as needed for anxiety.     Historical Provider, MD  cyclobenzaprine (FLEXERIL) 10 MG tablet Take 1 tablet (10 mg total) by mouth 2 (two) times daily as needed for muscle spasms. 09/09/16   [name] Tegeler, MD  diphenoxylate-atropine (LOMOTIL) 2.5-0.025 MG tablet Take 2 tablets by mouth 4 (four) times daily as needed for diarrhea or loose stools. 09/01/16   [name], MD    fluticasone (FLONASE) 50 MCG/ACT nasal spray Place 2 sprays into both nostrils daily. Patient not taking: Reported on 09/01/2016 07/25/16   Rise Mu, PA-C  hydrOXYzine (ATARAX/VISTARIL) 25 MG tablet Take 1 tablet (25 mg total) by mouth every 6 (six) hours. Patient not taking: Reported on 09/01/2016 08/18/16   [name], MD  ibuprofen (ADVIL,MOTRIN) 800 MG tablet Take 1 tablet (800 mg total) by mouth 3 (three) times daily. 08/18/16   [name], MD  methocarbamol (ROBAXIN) 500 MG tablet Take 1 tablet (500 mg total) by mouth 2 (two) times daily. Patient not taking: Reported on 09/09/2016 09/01/16   [name], MD  Phenyleph-Doxylamine-DM-APAP (ALKA-SELTZER PLS NIGHT CLD/FLU PO) Take 2 capsules by mouth every 6 (six) hours as needed (cold).    Historical Provider, MD  promethazine (PHENERGAN) 25 MG tablet Take 1 tablet (25 mg total) by mouth every 6 (six) hours as needed for nausea or vomiting. Patient not taking: Reported on 09/09/2016 08/18/16   [name], MD  promethazine (PHENERGAN) 25 MG tablet Take 1 tablet (25 mg total) by mouth every 6 (six) hours as needed for nausea or vomiting. 09/01/16   [name], MD  promethazine (PHENERGAN) 25 MG tablet Take 1 tablet (25 mg total) by mouth every 6 (six) hours as needed for nausea or vomiting. 09/09/16   Heide Scales, MD  Sertraline HCl (ZOLOFT PO) Take 1 tablet by mouth daily.    Historical Provider, MD  traMADol (ULTRAM) 50 MG tablet Take 1 tablet (50 mg total) by mouth every 6 (six) hours as needed. 08/18/16   [name], MD    Family History History reviewed. No pertinent family history.  Social History Social History  Substance Use Topics  . Smoking status: Current Every Day Smoker    Packs/day: 0.50    Types: Cigarettes  . Smokeless tobacco: Never Used  . Alcohol use No     Comment: occ     Allergies   Other; Percocet [oxycodone-acetaminophen]; and Zofran  [ondansetron hcl]   Review of Systems Review of Systems All systems reviewed and are negative for acute change except as noted in the HPI.  Physical Exam Updated Vital Signs BP 108/82 (BP Location: Right Arm)   Pulse 94   Temp 97.8 F (36.6 C) (Oral)   Resp 18   SpO2 95%   Physical Exam  Constitutional: She is oriented to person, place, and time. She appears well-developed and well-nourished. No distress.  HENT:  Head: Normocephalic and atraumatic.  Eyes: Conjunctivae are normal. Pupils are equal, round, and reactive to light.  Neck: Normal range of motion. Neck supple.  Cardiovascular: Normal rate.   Pulmonary/Chest: Effort normal and breath sounds normal.  Abdominal: She exhibits no distension.  Musculoskeletal: She exhibits tenderness. She exhibits no edema.  Pain over right patella No effusion No increased warmth or swelling  Neurological: She is alert and oriented to person, place, and time. She displays normal reflexes. No sensory deficit. She exhibits normal muscle tone. Coordination normal.  Skin: Skin is warm and dry. Capillary refill takes less than 2 seconds.  Psychiatric: She has a normal mood and affect. Her behavior is normal.  Nursing note and vitals reviewed.    ED Treatments / Results  DIAGNOSTIC STUDIES: Oxygen Saturation is 95% on RA, normal by my interpretation.    COORDINATION OF CARE: 1:10 AM Discussed treatment plan with pt at bedside and pt agreed to plan.   Labs (all labs ordered are listed, but only abnormal results are displayed) Labs Reviewed - No data to display  EKG  EKG Interpretation None       Radiology No results found.  Procedures Procedures (including critical care time)  Medications Ordered in ED Medications - No data to display   Initial Impression / Assessment and Plan / ED Course  I have reviewed the triage vital signs and the nursing notes.  Pertinent labs & imaging results that were available during my care  of the patient were reviewed by me and considered in my medical decision making (see chart for details).     Patient be referred to orthopedics for her knee pain.  Told to return here as needed.  Patient agrees the plan and all questions were answered.  The patient's headache is under good control at this point Final Clinical Impressions(s) / ED Diagnoses   Final diagnoses:  None    New Prescriptions New Prescriptions   No medications on file     I personally performed the services described in this documentation, which was scribed in my presence. The recorded information has been reviewed and is accurate.    Charlestine Night, PA-C 11/11/16 0300    Zadie Rhine, MD 11/13/16 [phone]

## 2016-11-22 ENCOUNTER — Encounter (HOSPITAL_COMMUNITY): Payer: Self-pay | Admitting: Emergency Medicine

## 2016-11-22 ENCOUNTER — Emergency Department (HOSPITAL_COMMUNITY)
Admission: EM | Admit: 2016-11-22 | Discharge: 2016-11-23 | Disposition: A | Discharge status: Home / Self Care | Payer: Self-pay | Attending: Emergency Medicine | Admitting: Emergency Medicine

## 2016-11-22 DIAGNOSIS — Z79899 Other long term (current) drug therapy: Secondary | ICD-10-CM | POA: Insufficient documentation

## 2016-11-22 DIAGNOSIS — F17210 Nicotine dependence, cigarettes, uncomplicated: Secondary | ICD-10-CM | POA: Insufficient documentation

## 2016-11-22 DIAGNOSIS — R197 Diarrhea, unspecified: Secondary | ICD-10-CM | POA: Insufficient documentation

## 2016-11-22 DIAGNOSIS — R51 Headache: Secondary | ICD-10-CM | POA: Insufficient documentation

## 2016-11-22 DIAGNOSIS — R519 Headache, unspecified: Secondary | ICD-10-CM

## 2016-11-22 DIAGNOSIS — R112 Nausea with vomiting, unspecified: Secondary | ICD-10-CM | POA: Insufficient documentation

## 2016-11-22 LAB — URINALYSIS, ROUTINE W REFLEX MICROSCOPIC
BILIRUBIN URINE: NEGATIVE
Glucose, UA: NEGATIVE mg/dL
Hgb urine dipstick: NEGATIVE
Ketones, ur: NEGATIVE mg/dL
Leukocytes, UA: NEGATIVE
Nitrite: NEGATIVE
Protein, ur: NEGATIVE mg/dL
Specific Gravity, Urine: 1.024 (ref 1.005–1.030)
pH: 6 (ref 5.0–8.0)

## 2016-11-22 LAB — CBC
HCT: 42.4 % (ref 36.0–46.0)
HEMOGLOBIN: 14.6 g/dL (ref 12.0–15.0)
MCH: 29.9 pg (ref 26.0–34.0)
MCHC: 34.4 g/dL (ref 30.0–36.0)
MCV: 86.7 fL (ref 78.0–100.0)
Platelets: 340 10*3/uL (ref 150–400)
RBC: 4.89 MIL/uL (ref 3.87–5.11)
RDW: 13.5 % (ref 11.5–15.5)
WBC: 8.8 10*3/uL (ref 4.0–10.5)

## 2016-11-22 LAB — COMPREHENSIVE METABOLIC PANEL
ALBUMIN: 3.9 g/dL (ref 3.5–5.0)
ALT: 27 U/L (ref 14–54)
ANION GAP: 8 (ref 5–15)
AST: 34 U/L (ref 15–41)
Alkaline Phosphatase: 74 U/L (ref 38–126)
BUN: 12 mg/dL (ref 6–20)
CO2: 22 mmol/L (ref 22–32)
Calcium: 9.1 mg/dL (ref 8.9–10.3)
Chloride: 109 mmol/L (ref 101–111)
Creatinine, Ser: 0.68 mg/dL (ref 0.44–1.00)
GFR calc non Af Amer: >60 mL/min (ref >60)
GLUCOSE: 125 mg/dL — AB (ref 65–99)
POTASSIUM: 3.6 mmol/L (ref 3.5–5.1)
SODIUM: 139 mmol/L (ref 135–145)
TOTAL PROTEIN: 7.3 g/dL (ref 6.5–8.1)

## 2016-11-22 LAB — LIPASE, BLOOD: LIPASE: 28 U/L (ref 11–51)

## 2016-11-22 NOTE — ED Triage Notes (Signed)
Pt comes in with complaints of a migraine that started last night followed by emesis and diarrhea.  Endorses hot flashes. Also complains of right heel pain.  States it feels like carpet burn. Small pinpoint scab noted to right heel.  Pt A&O x4 and ambulatory.

## 2016-11-22 NOTE — ED Notes (Signed)
Pt c/o n/v x3 and diarrhea x 4-5, 10/10 chronic migraines not controlled by tylenol and advil. Pt last took medication around 2000 tonight. N/V/D started last night.

## 2016-11-23 MED ORDER — SODIUM CHLORIDE 0.9 % IV BOLUS (SEPSIS)
1000.0000 mL | Freq: Once | INTRAVENOUS | Status: AC
  Administered 2016-11-23: 1000 mL via INTRAVENOUS

## 2016-11-23 MED ORDER — KETOROLAC TROMETHAMINE 30 MG/ML IJ SOLN
30.0000 mg | Freq: Once | INTRAMUSCULAR | Status: AC
  Administered 2016-11-23: 30 mg via INTRAVENOUS
  Filled 2016-11-23: qty 1

## 2016-11-23 MED ORDER — PROCHLORPERAZINE EDISYLATE 5 MG/ML IJ SOLN
10.0000 mg | Freq: Once | INTRAMUSCULAR | Status: AC
Start: 2016-11-23 — End: 2016-11-23
  Administered 2016-11-23: 10 mg via INTRAVENOUS
  Filled 2016-11-23: qty 2

## 2016-11-23 MED ORDER — BACITRACIN ZINC 500 UNIT/GM EX OINT
TOPICAL_OINTMENT | Freq: Once | CUTANEOUS | Status: AC
  Administered 2016-11-23: 1 via TOPICAL
  Filled 2016-11-23: qty 1.8

## 2016-11-23 MED ORDER — DIPHENHYDRAMINE HCL 50 MG/ML IJ SOLN
25.0000 mg | Freq: Once | INTRAMUSCULAR | Status: AC
  Administered 2016-11-23: 25 mg via INTRAVENOUS
  Filled 2016-11-23: qty 1

## 2016-11-23 NOTE — ED Provider Notes (Signed)
[facility]ER[facility]Y DEPT Provider Note   CSN: [id number] Arrival date & time: 11/22/16  2226  By signing my name below, I, Kerry Young, attest that this documentation has been prepared under the direction and in the presence of non-physician practitioner, Roxy Horseman, PA-C. Electronically Signed: Majel Young, Scribe. 11/23/2016. 12:17 AM.  History   Chief Complaint No chief complaint on file.  The history is provided by the patient. No language interpreter was used.   HPI Comments: Kerry Young is a 51 y.o. female with PMHx of anxiety and migraines, who presents to the Emergency Department complaining of gradually worsening, right-sided headache consistent with previous migraines that began last night. Pt reports associated nausea with multiple episodes of vomiting and diarrhea that she attributes to her headache. She states her last migraine occurred on 11/14/16 in which she visited [facility] ED and was treated with complete relief of her symptoms. Pt also complains of gradually worsening, right heel pain that began yesterday while "chasing her dog." She notes it feels like "six layers of skin have been ripped off her foot." She denies any other complaints.   Past Medical History:  Diagnosis Date  . Anxiety   . Migraines    There are no active problems to display for this patient.  History reviewed. No pertinent surgical history.  OB History    No data available     Home Medications    Prior to Admission medications   Medication Sig Start Date End Date Taking? Authorizing Provider  acetaminophen (TYLENOL) 500 MG tablet Take 1,000 mg by mouth every 6 (six) hours as needed for moderate pain.    [provider]  ALPRAZolam Prudy Feeler) 1 MG tablet Take 0.5-1 mg by mouth 2 (two) times daily as needed for anxiety.     [provider]  butalbital-acetaminophen-caffeine (FIORICET, ESGIC) 50-325-40 MG tablet Take 1 tablet by mouth every 6 (six) hours as needed for migraine. 11/11/16  11/11/17  Lawyer, [name], PA-C  cyclobenzaprine (FLEXERIL) 10 MG tablet Take 1 tablet (10 mg total) by mouth 2 (two) times daily as needed for muscle spasms. 09/09/16   Tegeler, [name], MD  diphenoxylate-atropine (LOMOTIL) 2.5-0.025 MG tablet Take 2 tablets by mouth 4 (four) times daily as needed for diarrhea or loose stools. 09/01/16   [name], MD  fluticasone (FLONASE) 50 MCG/ACT nasal spray Place 2 sprays into both nostrils daily. Patient not taking: Reported on 09/01/2016 07/25/16   Demetrios Loll T, PA-C  hydrOXYzine (ATARAX/VISTARIL) 25 MG tablet Take 1 tablet (25 mg total) by mouth every 6 (six) hours. Patient not taking: Reported on 09/01/2016 08/18/16   [name], MD  ibuprofen (ADVIL,MOTRIN) 800 MG tablet Take 1 tablet (800 mg total) by mouth 3 (three) times daily. 08/18/16   [name], MD  methocarbamol (ROBAXIN) 500 MG tablet Take 1 tablet (500 mg total) by mouth 2 (two) times daily. Patient not taking: Reported on 09/09/2016 09/01/16   [name], MD  Phenyleph-Doxylamine-DM-APAP (ALKA-SELTZER PLS NIGHT CLD/FLU PO) Take 2 capsules by mouth every 6 (six) hours as needed (cold).    [provider]  promethazine (PHENERGAN) 25 MG tablet Take 1 tablet (25 mg total) by mouth every 6 (six) hours as needed for nausea or vomiting. Patient not taking: Reported on 09/09/2016 08/18/16   [name], MD  promethazine (PHENERGAN) 25 MG tablet Take 1 tablet (25 mg total) by mouth every 6 (six) hours as needed for nausea or vomiting. 09/01/16   Pollina,  [name], MD  promethazine (PHENERGAN) 25 MG tablet Take 1 tablet (25 mg total) by mouth every 6 (six) hours as needed for nausea or vomiting. 09/09/16   Tegeler, [name], MD  Sertraline HCl (ZOLOFT PO) Take 1 tablet by mouth daily.    [provider]  traMADol (ULTRAM) 50 MG tablet Take 1 tablet (50 mg total) by mouth every 6 (six) hours as needed. 08/18/16    [name], MD    Family History No family history on file.  Social History Social History  Substance Use Topics  . Smoking status: Current Every Day Smoker    Packs/day: 0.50    Types: Cigarettes  . Smokeless tobacco: Never Used  . Alcohol use No     Comment: occ   Allergies   Other; Percocet [oxycodone-acetaminophen]; and Zofran [ondansetron hcl]  Review of Systems Review of Systems  Gastrointestinal: Positive for diarrhea, nausea and vomiting.  Skin: Positive for wound (right heel).  Neurological: Positive for headaches.   Physical Exam Updated Vital Signs BP 119/89 (BP Location: Left Arm)   Pulse 82   Temp 98 F (36.7 C) (Oral)   Resp 18   Ht 5\' 2"  (1.575 m)   Wt 170 lb (77.1 kg)   SpO2 98%   BMI 31.09 kg/m   Physical Exam  Constitutional: She is oriented to person, place, and time. She appears well-developed and well-nourished. No distress.  HENT:  Head: Normocephalic and atraumatic.  Right Ear: External ear normal.  Left Ear: External ear normal.  Eyes: Conjunctivae and EOM are normal. Pupils are equal, round, and reactive to light.  Neck: Normal range of motion. Neck supple.  No pain with neck flexion, no meningismus  Cardiovascular: Normal rate, regular rhythm and normal heart sounds.  Exam reveals no gallop and no friction rub.   No murmur heard. Pulmonary/Chest: Effort normal and breath sounds normal. No respiratory distress. She has no wheezes. She has no rales. She exhibits no tenderness.  Abdominal: Soft. She exhibits no distension and no mass. There is no tenderness. There is no rebound and no guarding.  Musculoskeletal: Normal range of motion. She exhibits no edema or tenderness.  Normal gait.  Neurological: She is alert and oriented to person, place, and time. She has normal reflexes.  CN 3-12 intact, normal finger to nose, no pronator drift, sensation and strength intact bilaterally.  Skin: Skin is warm and dry.  Psychiatric: She  has a normal mood and affect. Her behavior is normal. Judgment and thought content normal.  Nursing note and vitals reviewed.  ED Treatments / Results  DIAGNOSTIC STUDIES:  Oxygen Saturation is 98% on RA, normal by my interpretation.    COORDINATION OF CARE:  12:15 AM Discussed treatment plan with pt at bedside and pt agreed to plan.  Labs (all labs ordered are listed, but only abnormal results are displayed) Labs Reviewed  COMPREHENSIVE METABOLIC PANEL - Abnormal; Notable for the following:       Result Value   Glucose, Bld 125 (*)    Total Bilirubin <0.1 (*)    All other components within normal limits  LIPASE, BLOOD  CBC  URINALYSIS, ROUTINE W REFLEX MICROSCOPIC    EKG  EKG Interpretation None       Radiology No results found.  Procedures Procedures (including critical care time)  Medications Ordered in ED Medications - No data to display  Initial Impression / Assessment and Plan / ED Course  I have reviewed the triage vital  signs and the nursing notes.  Pertinent labs & imaging results that were available during my care of the patient were reviewed by me and considered in my medical decision making (see chart for details).     Pt HA treated and improved while in ED.  Presentation is like pts typical HA and is not concerning for [facility], ICH, Meningitis, or temporal arteritis. Pt is afebrile with no focal neuro deficits, nuchal rigidity, or change in vision. Pt is to follow up with PCP to discuss prophylactic medication. Pt verbalizes understanding and is agreeable with plan to dc.    I personally performed the services described in this documentation, which was scribed in my presence. The recorded information has been reviewed and is accurate.     Final Clinical Impressions(s) / ED Diagnoses   Final diagnoses:  Acute nonintractable headache, unspecified headache type  Nausea vomiting and diarrhea    New Prescriptions New Prescriptions   No medications  on file     Roxy HorsemanBrowning, Osiel Stick, Cordelia Poche-C 11/23/16 [id number]    Devoria AlbeKnapp, Iva, MD 11/23/16 [phone]

## 2016-12-03 ENCOUNTER — Emergency Department (HOSPITAL_COMMUNITY)
Admission: EM | Admit: 2016-12-03 | Discharge: 2016-12-03 | Disposition: A | Discharge status: Home / Self Care | Payer: Self-pay | Attending: Emergency Medicine | Admitting: Emergency Medicine

## 2016-12-03 ENCOUNTER — Encounter (HOSPITAL_COMMUNITY): Payer: Self-pay | Admitting: Emergency Medicine

## 2016-12-03 DIAGNOSIS — J069 Acute upper respiratory infection, unspecified: Secondary | ICD-10-CM | POA: Insufficient documentation

## 2016-12-03 DIAGNOSIS — F17210 Nicotine dependence, cigarettes, uncomplicated: Secondary | ICD-10-CM | POA: Insufficient documentation

## 2016-12-03 DIAGNOSIS — B9789 Other viral agents as the cause of diseases classified elsewhere: Secondary | ICD-10-CM

## 2016-12-03 DIAGNOSIS — Z79899 Other long term (current) drug therapy: Secondary | ICD-10-CM | POA: Insufficient documentation

## 2016-12-03 DIAGNOSIS — Z720 Tobacco use: Secondary | ICD-10-CM

## 2016-12-03 NOTE — ED Triage Notes (Signed)
Patient here with complaints of cough, sore throat, itchy eyes and runny knows for 3 days. Reports taking OTC medication with no relief.

## 2016-12-03 NOTE — ED Provider Notes (Signed)
[facility]-EMERG[facility] DEPT Provider Note    By signing my name below, I, Earmon Phoenix, attest that this documentation has been prepared under the direction and in the presence of [address], [employer]. Electronically Signed: Earmon Phoenix, ED Scribe. 12/03/16. 12:45 PM.    History   Chief Complaint Chief Complaint  Patient presents with  . Cough  . Sore Throat   The history is provided by the patient and medical records. No language interpreter was used.  URI   This is a new problem. The current episode started 2 days ago. The problem has been gradually worsening. There has been no fever. Associated symptoms include congestion, ear pain, rhinorrhea, sore throat and cough. Pertinent negatives include no chest pain, no abdominal pain, no diarrhea, no nausea, no vomiting, no dysuria and no rash. She has tried other medications (alka seltzer) for the symptoms. The treatment provided no relief.     Kerry Young is a 51 y.o. female who presents to the Emergency Department complaining of a constant moderate burning/itching sore throat that began two days ago. She reports associated congestion, clear rhinorrhea, bilateral ear itching and burning (left worse than right), and productive cough of yellowish sputum. She has taken OTC Alka Seltzer cold medication and cough drops for symptoms with no significant relief. No known aggravating factors. She denies known sick contacts but works in [employer]. She denies drooling, fever, chills, trismus, ear drainage, SOB, CP, abdominal pain, constipation, diarrhea, nausea, vomiting, hematuria, dysuria, body aches, numbness, tingling, focal weakness, rashes, or any other complaints at this time. She is a current everyday smoker.   Past Medical History:  Diagnosis Date  . Anxiety   . Migraines     There are no active problems to display for this patient.   History reviewed. No pertinent surgical history.  OB History    No data available        Home Medications    Prior to Admission medications   Medication Sig Start Date End Date Taking? Authorizing Provider  acetaminophen (TYLENOL) 500 MG tablet Take 1,000 mg by mouth every 6 (six) hours as needed for moderate pain.    [provider]  ALPRAZolam Prudy Feeler) 1 MG tablet Take 0.5-1 mg by mouth 2 (two) times daily as needed for anxiety.     [provider]  butalbital-acetaminophen-caffeine (FIORICET, ESGIC) 50-325-40 MG tablet Take 1 tablet by mouth every 6 (six) hours as needed for migraine. Patient not taking: Reported on 11/23/2016 11/11/16 11/11/17  Charlestine Night, PA-C  cyclobenzaprine (FLEXERIL) 10 MG tablet Take 1 tablet (10 mg total) by mouth 2 (two) times daily as needed for muscle spasms. Patient not taking: Reported on 11/23/2016 09/09/16   Tegeler, [name], MD  diphenoxylate-atropine (LOMOTIL) 2.5-0.025 MG tablet Take 2 tablets by mouth 4 (four) times daily as needed for diarrhea or loose stools. Patient not taking: Reported on 11/23/2016 09/01/16   [name], MD  fluticasone [facility]) 50 MCG/ACT nasal spray Place 2 sprays into both nostrils daily. Patient not taking: Reported on 09/01/2016 07/25/16   Demetrios Loll T, PA-C  hydrOXYzine (ATARAX/VISTARIL) 25 MG tablet Take 1 tablet (25 mg total) by mouth every 6 (six) hours. Patient not taking: Reported on 09/01/2016 08/18/16   [name], MD  ibuprofen (ADVIL,MOTRIN) 200 MG tablet Take 400 mg by mouth every 6 (six) hours as needed for headache, mild pain or moderate pain.    [provider]  ibuprofen (ADVIL,MOTRIN) 800 MG tablet Take 1 tablet (800  mg total) by mouth 3 (three) times daily. Patient not taking: Reported on 11/23/2016 08/18/16   Gilda CreasePollina, [name], MD  methocarbamol (ROBAXIN) 500 MG tablet Take 1 tablet (500 mg total) by mouth 2 (two) times daily. Patient not taking: Reported on 09/09/2016 09/01/16   Gilda CreasePollina, [name], MD  promethazine (PHENERGAN) 25  MG tablet Take 1 tablet (25 mg total) by mouth every 6 (six) hours as needed for nausea or vomiting. Patient not taking: Reported on 09/09/2016 08/18/16   Gilda CreasePollina, [name], MD  promethazine (PHENERGAN) 25 MG tablet Take 1 tablet (25 mg total) by mouth every 6 (six) hours as needed for nausea or vomiting. Patient not taking: Reported on 11/23/2016 09/01/16   Gilda CreasePollina, [name], MD  promethazine (PHENERGAN) 25 MG tablet Take 1 tablet (25 mg total) by mouth every 6 (six) hours as needed for nausea or vomiting. Patient not taking: Reported on 11/23/2016 09/09/16   Tegeler, Canary Brimhristopher J, MD  traMADol (ULTRAM) 50 MG tablet Take 1 tablet (50 mg total) by mouth every 6 (six) hours as needed. Patient taking differently: Take 50 mg by mouth every 6 (six) hours as needed for moderate pain or severe pain.  08/18/16   Gilda CreasePollina, [name], MD    Family History No family history on file.  Social History Social History  Substance Use Topics  . Smoking status: Current Every Day Smoker    Packs/day: 0.50    Types: Cigarettes  . Smokeless tobacco: Never Used  . Alcohol use No     Comment: occ     Allergies   Other; Percocet [oxycodone-acetaminophen]; and Zofran [ondansetron hcl]   Review of Systems Review of Systems  Constitutional: Negative for chills and fever.  HENT: Positive for congestion, ear pain, rhinorrhea and sore throat. Negative for drooling, ear discharge and trouble swallowing.   Eyes: Positive for itching.  Respiratory: Positive for cough. Negative for shortness of breath.   Cardiovascular: Negative for chest pain.  Gastrointestinal: Negative for abdominal pain, constipation, diarrhea, nausea and vomiting.  Genitourinary: Negative for dysuria and hematuria.  Musculoskeletal: Negative for arthralgias and myalgias.  Skin: Negative for color change and rash.  Allergic/Immunologic: Negative for immunocompromised state.  Neurological: Negative for weakness and numbness.   Psychiatric/Behavioral: Negative for confusion.  All other systems reviewed and are negative for acute change except as noted in the HPI.     Physical Exam Updated Vital Signs BP 112/87 (BP Location: Right Arm)   Pulse (!) 103   Temp 98 F (36.7 C) (Oral)   Resp 18   SpO2 99%   Physical Exam  Constitutional: She is oriented to person, place, and time. Vital signs are normal. She appears well-developed and well-nourished.  Non-toxic appearance. No distress.  Afebrile, nontoxic, NAD  HENT:  Head: Normocephalic and atraumatic.  Right Ear: Hearing, tympanic membrane, external ear and ear canal normal.  Left Ear: Hearing, tympanic membrane, external ear and ear canal normal.  Nose: Mucosal edema and rhinorrhea present.  Mouth/Throat: Uvula is midline, oropharynx is clear and moist and mucous membranes are normal. No trismus in the jaw. No uvula swelling. Tonsils are 0 on the right. Tonsils are 0 on the left. No tonsillar exudate.  Ears are clear bilaterally. Nose with mucosal edema and clear rhinorrhea. Oropharynx clear and moist, without uvular swelling or deviation, no trismus or drooling, no tonsillar swelling or erythema, no exudates.  Eyes: Conjunctivae and EOM are normal. Right eye exhibits no discharge. Left eye exhibits no discharge.  Neck: Normal range of motion. Neck supple.  Cardiovascular: Normal rate, regular rhythm, normal heart sounds and intact distal pulses.  Exam reveals no gallop and no friction rub.   No murmur heard. Pulmonary/Chest: Effort normal and breath sounds normal. No respiratory distress. She has no decreased breath sounds. She has no wheezes. She has no rhonchi. She has no rales.  CTAB in all lung fields, no w/r/r, no hypoxia or increased WOB, speaking in full sentences, SpO2 99% on RA  Abdominal: Soft. Normal appearance and bowel sounds are normal. She exhibits no distension. There is no tenderness. There is no rigidity, no rebound, no guarding, no CVA  tenderness, no tenderness at McBurney's point and negative Murphy's sign.  Musculoskeletal: Normal range of motion.  Neurological: She is alert and oriented to person, place, and time. She has normal strength. No sensory deficit.  Skin: Skin is warm, dry and intact. No rash noted.  Psychiatric: She has a normal mood and affect.  Nursing note and vitals reviewed.    ED Treatments / Results  DIAGNOSTIC STUDIES: Oxygen Saturation is 99% on RA, normal by my interpretation.   COORDINATION OF CARE: 12:28 PM- Advised pt that symptoms are likely viral in nature. Recommended symptomatic treatment. Will provide work note. Pt verbalizes understanding and agrees to plan.  Medications - No data to display  Labs (all labs ordered are listed, but only abnormal results are displayed) Labs Reviewed - No data to display  EKG  EKG Interpretation None       Radiology No results found.  Procedures Procedures (including critical care time)  Medications Ordered in ED Medications - No data to display   Initial Impression / Assessment and Plan / ED Course  I have reviewed the triage vital signs and the nursing notes.  Pertinent labs & imaging results that were available during my care of the patient were reviewed by me and considered in my medical decision making (see chart for details).     51 y.o. female here with URI symptoms x2 days. Pt is afebrile with a clear lung exam. Mild rhinorrhea and congestion; throat and ears clear. Likely viral URI. Pt is agreeable to symptomatic treatment with close follow up with PCP as needed but spoke at length about emergent changing or worsening of symptoms that should prompt return to ER. Pt voices understanding and is agreeable to plan. Stable at time of discharge. Smoking cessation strongly encouraged.  I personally performed the services described in this documentation, which was scribed in my presence. The recorded information has been reviewed and is  accurate.    Final Clinical Impressions(s) / ED Diagnoses   Final diagnoses:  Viral URI with cough  Viral upper respiratory tract infection  Tobacco user    New Prescriptions New Prescriptions   No medications on [address] [place], [place] 12/03/16 1252    Charlynne Pander, MD 12/05/16 [phone]

## 2016-12-03 NOTE — Discharge Instructions (Signed)
Continue to stay well-hydrated. Gargle warm salt water and spit it out. Use chloraseptic spray as needed for sore throat. Continue to alternate between Tylenol and Ibuprofen for pain or fever. Use Mucinex for cough suppression/expectoration of mucus. Use netipot and flonase to help with nasal congestion. May consider over-the-counter Benadryl or other antihistamine to decrease secretions and for help with your symptoms. STOP SMOKING! Follow up with your primary care doctor in 5-7 days for recheck of ongoing symptoms. Return to emergency department for emergent changing or worsening of symptoms. °

## 2016-12-07 ENCOUNTER — Encounter (HOSPITAL_COMMUNITY): Payer: Self-pay | Admitting: Emergency Medicine

## 2016-12-07 DIAGNOSIS — R109 Unspecified abdominal pain: Secondary | ICD-10-CM | POA: Insufficient documentation

## 2016-12-07 DIAGNOSIS — Z5321 Procedure and treatment not carried out due to patient leaving prior to being seen by health care provider: Secondary | ICD-10-CM | POA: Insufficient documentation

## 2016-12-07 MED ORDER — OXYCODONE-ACETAMINOPHEN 5-325 MG PO TABS
1.0000 | ORAL_TABLET | ORAL | Status: DC | PRN
  Administered 2016-12-07: 1 via ORAL
  Filled 2016-12-07: qty 1

## 2016-12-07 NOTE — ED Triage Notes (Signed)
Patient here with complaints of left sided flank pain onset of yesterday. Pt hx of kidney stones. C/o minor burning with voiding.

## 2016-12-08 ENCOUNTER — Emergency Department (HOSPITAL_COMMUNITY)
Admission: EM | Admit: 2016-12-08 | Discharge: 2016-12-08 | Disposition: A | Discharge status: Home / Self Care | Payer: Self-pay | Attending: Emergency Medicine | Admitting: Emergency Medicine

## 2016-12-08 ENCOUNTER — Encounter (HOSPITAL_COMMUNITY): Payer: Self-pay | Admitting: Emergency Medicine

## 2016-12-08 ENCOUNTER — Emergency Department (HOSPITAL_COMMUNITY): Payer: Self-pay

## 2016-12-08 DIAGNOSIS — Z79899 Other long term (current) drug therapy: Secondary | ICD-10-CM | POA: Insufficient documentation

## 2016-12-08 DIAGNOSIS — F17210 Nicotine dependence, cigarettes, uncomplicated: Secondary | ICD-10-CM | POA: Insufficient documentation

## 2016-12-08 DIAGNOSIS — N3000 Acute cystitis without hematuria: Secondary | ICD-10-CM | POA: Insufficient documentation

## 2016-12-08 LAB — URINALYSIS, ROUTINE W REFLEX MICROSCOPIC
Bacteria, UA: NONE SEEN
Bilirubin Urine: NEGATIVE
GLUCOSE, UA: NEGATIVE mg/dL
Hgb urine dipstick: NEGATIVE
Ketones, ur: NEGATIVE mg/dL
NITRITE: NEGATIVE
PH: 5 (ref 5.0–8.0)
Protein, ur: NEGATIVE mg/dL
SPECIFIC GRAVITY, URINE: 1.024 (ref 1.005–1.030)

## 2016-12-08 LAB — CBC WITH DIFFERENTIAL/PLATELET
Basophils Absolute: 0 10*3/uL (ref 0.0–0.1)
Basophils Relative: 0 %
Eosinophils Absolute: 0.3 10*3/uL (ref 0.0–0.7)
Eosinophils Relative: 4 %
HEMATOCRIT: 44.8 % (ref 36.0–46.0)
HEMOGLOBIN: 14.7 g/dL (ref 12.0–15.0)
LYMPHS PCT: 34 %
Lymphs Abs: 2.7 10*3/uL (ref 0.7–4.0)
MCH: 28.8 pg (ref 26.0–34.0)
MCHC: 32.8 g/dL (ref 30.0–36.0)
MCV: 87.8 fL (ref 78.0–100.0)
MONO ABS: 0.7 10*3/uL (ref 0.1–1.0)
MONOS PCT: 9 %
NEUTROS ABS: 4.2 10*3/uL (ref 1.7–7.7)
NEUTROS PCT: 53 %
Platelets: 319 10*3/uL (ref 150–400)
RBC: 5.1 MIL/uL (ref 3.87–5.11)
RDW: 13.4 % (ref 11.5–15.5)
WBC: 8 10*3/uL (ref 4.0–10.5)

## 2016-12-08 LAB — COMPREHENSIVE METABOLIC PANEL
ALBUMIN: 3.9 g/dL (ref 3.5–5.0)
ALK PHOS: 72 U/L (ref 38–126)
ALT: 35 U/L (ref 14–54)
AST: 34 U/L (ref 15–41)
Anion gap: 10 (ref 5–15)
BILIRUBIN TOTAL: 0.7 mg/dL (ref 0.3–1.2)
BUN: 12 mg/dL (ref 6–20)
CALCIUM: 9.1 mg/dL (ref 8.9–10.3)
CO2: 25 mmol/L (ref 22–32)
Chloride: 102 mmol/L (ref 101–111)
Creatinine, Ser: 0.82 mg/dL (ref 0.44–1.00)
GFR calc Af Amer: >60 mL/min (ref >60)
GLUCOSE: 86 mg/dL (ref 65–99)
POTASSIUM: 3.9 mmol/L (ref 3.5–5.1)
Sodium: 137 mmol/L (ref 135–145)
TOTAL PROTEIN: 7.4 g/dL (ref 6.5–8.1)

## 2016-12-08 IMAGING — CT CT RENAL STONE PROTOCOL
2 of 3 series · 16 of 46 positions shown, 18 images · non-contrast
Comparison: [DATE]

CLINICAL DATA: 50-year-old female with left flank pain and dysuria
starting 2 days ago. Initial encounter.

EXAM:
CT ABDOMEN AND PELVIS WITHOUT CONTRAST
TECHNIQUE: Multidetector CT imaging of the abdomen and pelvis was performed
following the standard protocol without IV contrast.

[Series 4: lung · axial · 0.74mm/px · z∈[-113,-19]mm · 13 of 55 slices shown, 15 images]
[im 4/55  soft-tissue]
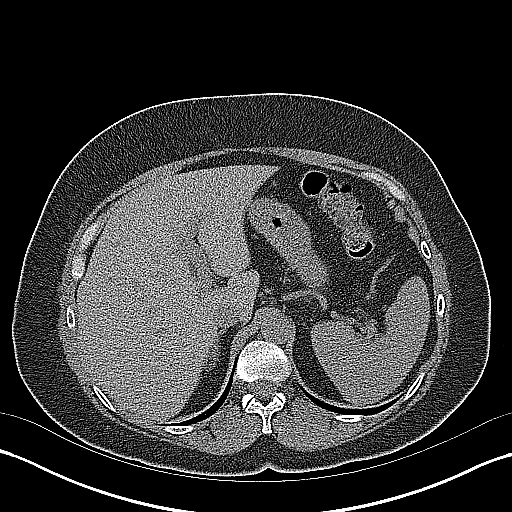
[im 4/55  bone]
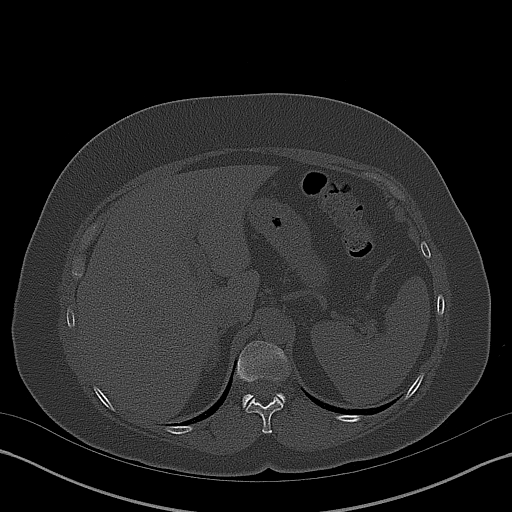
[im 7/55  soft-tissue]
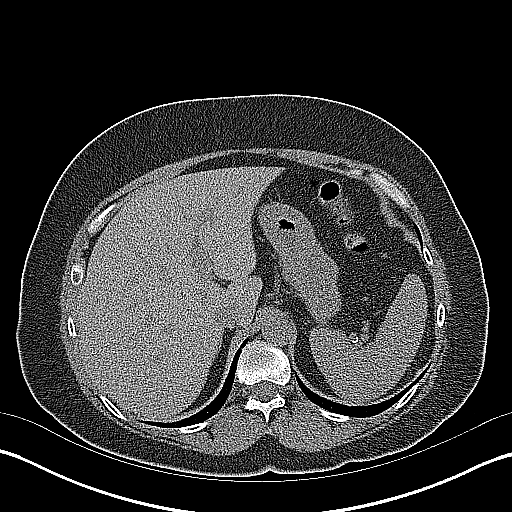
[im 11/55  soft-tissue]
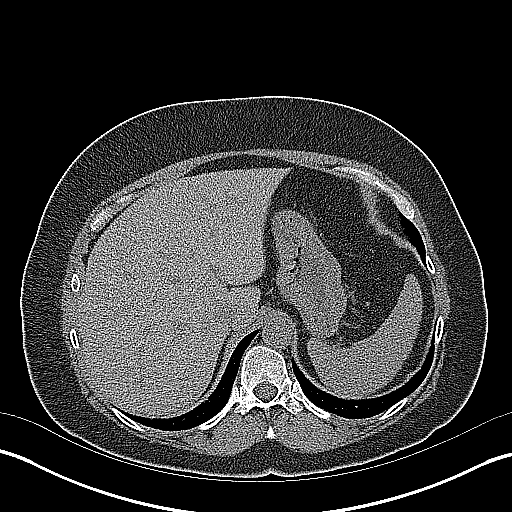
[im 16/55  soft-tissue]
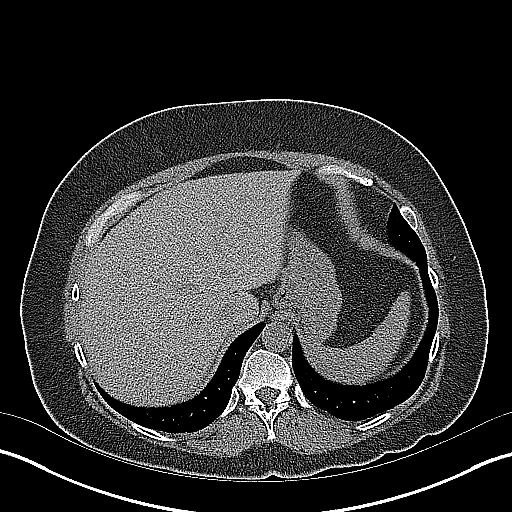
[im 20/55  soft-tissue]
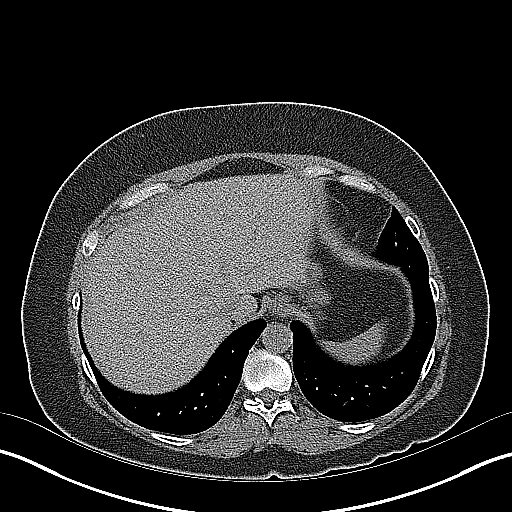
[im 23/55  soft-tissue]
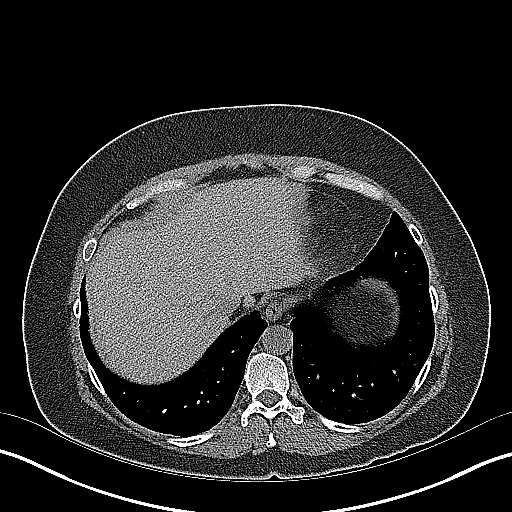
[im 28/55  soft-tissue]
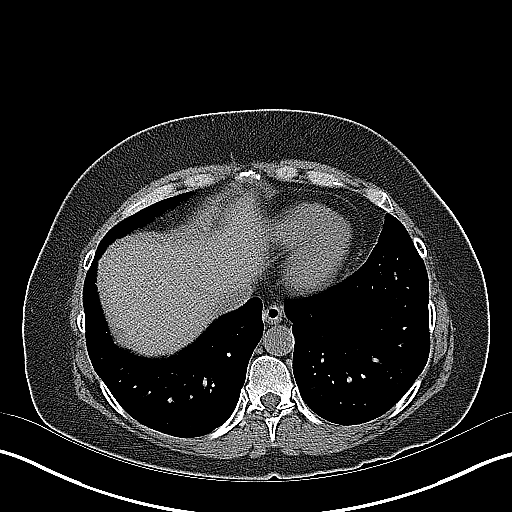
[im 32/55  soft-tissue]
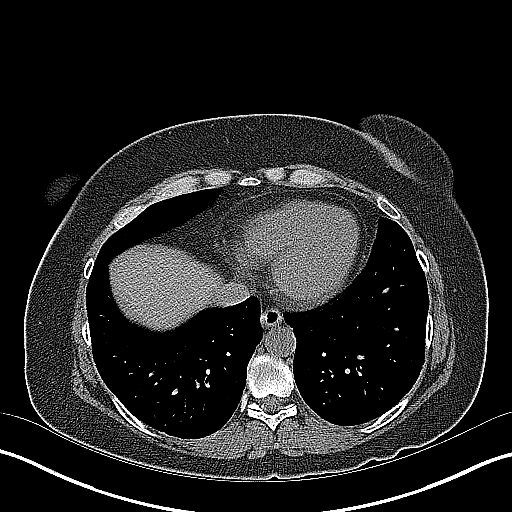
[im 35/55  soft-tissue]
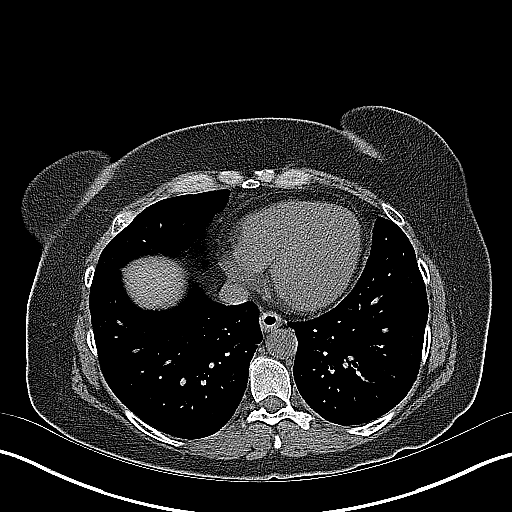
[im 35/55  bone]
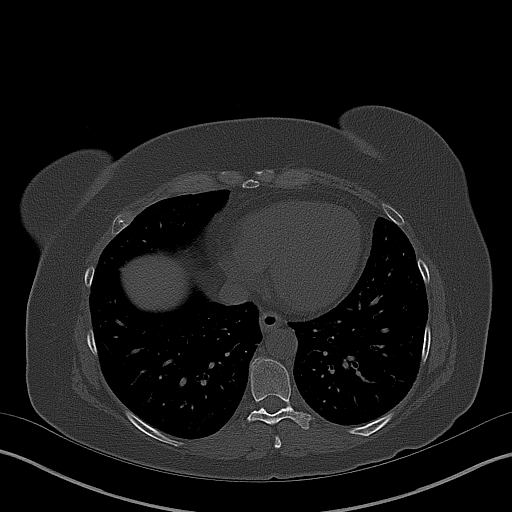
[im 39/55  soft-tissue]
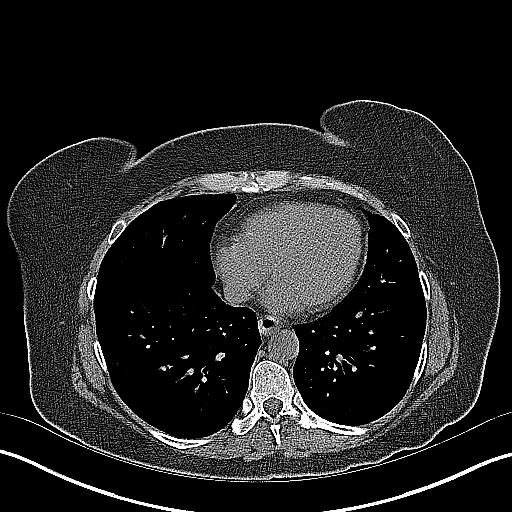
[im 44/55  soft-tissue]
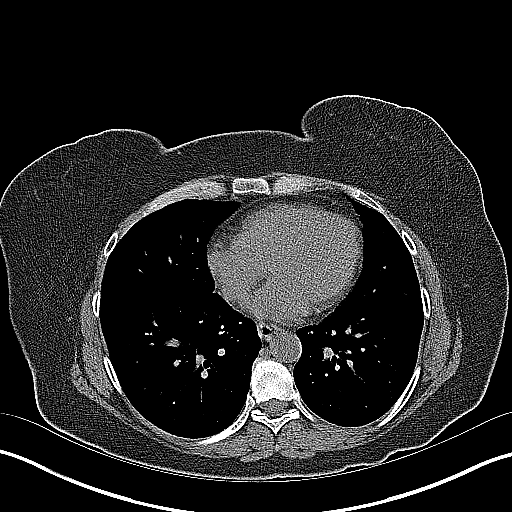
[im 48/55  soft-tissue]
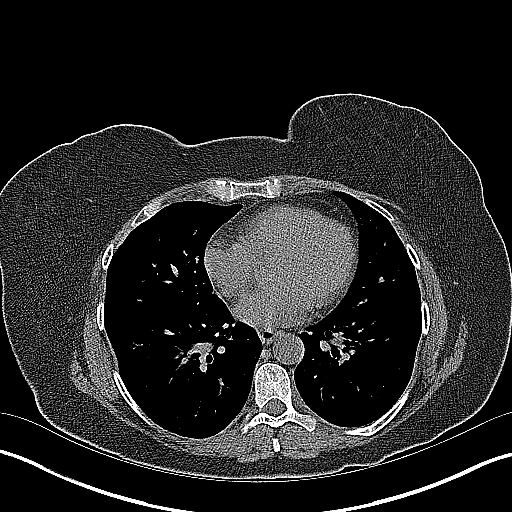
[im 51/55  soft-tissue]
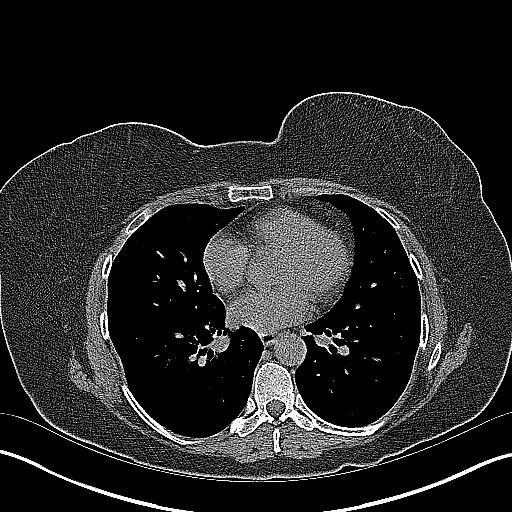

[Series 5: coronal · coronal · 0.81mm/px · 3 of 145 slices shown]
[im 49/145  soft-tissue]
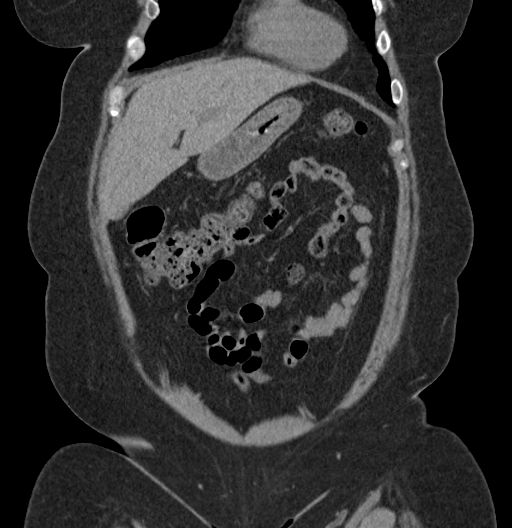
[im 65/145  soft-tissue]
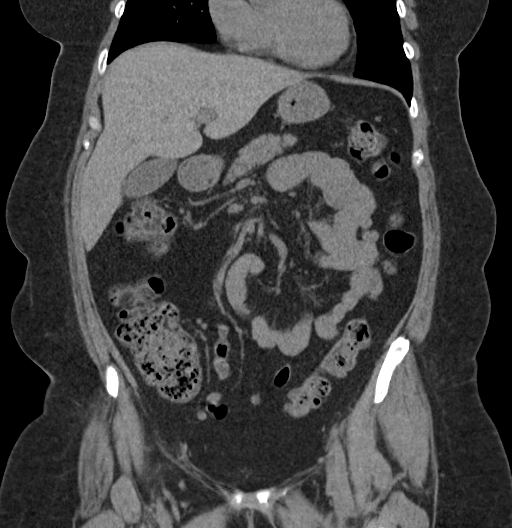
[im 81/145  soft-tissue]
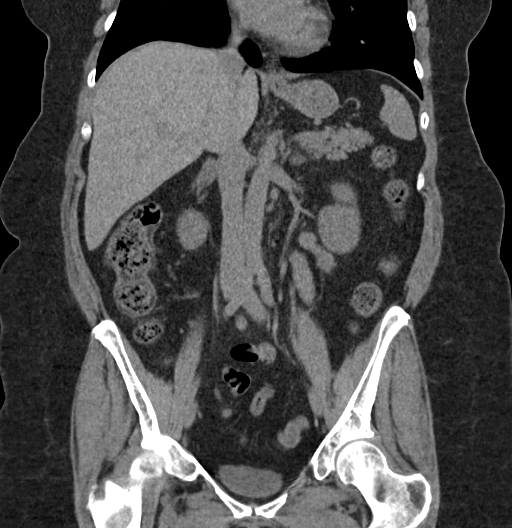

[16 of 46 positions shown; findings below may reference images not displayed]

FINDINGS: Lower chest: Minimal basilar atelectasis/ scarring. Heart size
within normal limits.

Hepatobiliary: Taking into account limitation by non contrast
imaging, no worrisome hepatic lesion. Liver top-normal size. No
calcified gallstone.

Pancreas: Taking into account limitation by non contrast imaging, no
mass or inflammation.

Spleen: Taking into account limitation by non contrast imaging, no
mass or enlargement.

Adrenals/Urinary Tract: No renal or ureteral obstructing stone or
hydronephrosis. Taking into account limitation by non contrast
imaging, no renal or adrenal mass. Noncontrast filled views of the
urinary bladder unremarkable.

Stomach/Bowel: Under distended portions of stomach, small bowel and
colon limited evaluation however, no extraluminal bowel inflammatory
process, free fluid or free air. Specifically, no inflammation
surrounds the appendix or colonic diverticula.

Circumferential thickening rectosigmoid region and gastric body may
be related to under distension although difficult to exclude mild
inflammation. Surrounding fat planes unremarkable.

Vascular/Lymphatic: No abdominal aortic aneurysm. Focal
calcification splenic hilum may represent simple vascular
calcifications versus tiny 6 mm aneurysm without change compared to
[DATE]. The splenic artery proximal to this region measures
mm. No adenopathy.

Reproductive: No worrisome uterine or adnexal abnormality

Other: No free air or abnormal fluid collection. No bowel containing
hernia.

Musculoskeletal: No osseous destructive lesion. Facet degenerative
changes with bilateral L5-S1 foraminal narrowing.
IMPRESSION: No renal or ureteral obstructing stone or evidence of
hydronephrosis.

No extraluminal bowel inflammatory process identified. Under
distension of portions of the stomach, small bowel and colon limit
evaluation.

Stable focal calcification splenic hilum may represent simple
vascular calcifications versus tiny 6 mm aneurysm. The splenic
artery proximal to this region measures 5.4 mm.

## 2016-12-08 MED ORDER — HYDROMORPHONE HCL 1 MG/ML IJ SOLN
1.0000 mg | Freq: Once | INTRAMUSCULAR | Status: AC
  Administered 2016-12-08: 1 mg via INTRAVENOUS
  Filled 2016-12-08: qty 1

## 2016-12-08 MED ORDER — PROCHLORPERAZINE EDISYLATE 5 MG/ML IJ SOLN
10.0000 mg | Freq: Once | INTRAMUSCULAR | Status: AC
  Administered 2016-12-08: 10 mg via INTRAVENOUS
  Filled 2016-12-08: qty 2

## 2016-12-08 MED ORDER — CEPHALEXIN 500 MG PO CAPS
500.0000 mg | ORAL_CAPSULE | Freq: Once | ORAL | Status: AC
  Administered 2016-12-08: 500 mg via ORAL
  Filled 2016-12-08: qty 1

## 2016-12-08 MED ORDER — HYDROCODONE-ACETAMINOPHEN 5-325 MG PO TABS: 1.0000 | ORAL_TABLET | Freq: Four times a day (QID) | ORAL | 0 refills | Status: DC | PRN

## 2016-12-08 MED ORDER — CEPHALEXIN 500 MG PO CAPS: 500.0000 mg | ORAL_CAPSULE | Freq: Four times a day (QID) | ORAL | 0 refills | Status: DC

## 2016-12-08 MED ORDER — FENTANYL CITRATE (PF) 100 MCG/2ML IJ SOLN
50.0000 ug | INTRAMUSCULAR | Status: DC | PRN
  Administered 2016-12-08: 50 ug via NASAL
  Filled 2016-12-08: qty 2

## 2016-12-08 NOTE — Discharge Instructions (Signed)
Follow-up with your doctor next week for recheck drink plenty of fluids 

## 2016-12-08 NOTE — ED Notes (Signed)
Bed: [facility] Expected date:  Expected time:  Means of arrival:  Comments: 

## 2016-12-08 NOTE — ED Provider Notes (Signed)
[facility]-EMERGENCY DEPT Provider Note   CSN: [id number] Arrival date & time: 12/08/16  4098     History   Chief Complaint Chief Complaint  Patient presents with  . Flank Pain    HPI Kerry Young is a 51 y.o. female.  Patient complains of left flank pain also complains of some dysuria   The history is provided by the patient.  Flank Pain  This is a new problem. The problem occurs constantly. The problem has not changed since onset.Pertinent negatives include no chest pain, no abdominal pain and no headaches. Nothing aggravates the symptoms. Nothing relieves the symptoms. She has tried nothing for the symptoms. The treatment provided no relief.    Past Medical History:  Diagnosis Date  . Anxiety   . Migraines     There are no active problems to display for this patient.   History reviewed. No pertinent surgical history.  OB History    No data available       Home Medications    Prior to Admission medications   Medication Sig Start Date End Date Taking? Authorizing Provider  acetaminophen (TYLENOL) 500 MG tablet Take 1,000 mg by mouth every 6 (six) hours as needed for moderate pain.   Yes [provider]  ALPRAZolam Prudy Feeler) 1 MG tablet Take 0.5-1 mg by mouth 2 (two) times daily as needed for anxiety.    Yes [provider]  diphenhydrAMINE (BENADRYL) 25 mg capsule Take 50 mg by mouth every 6 (six) hours as needed.   Yes [provider]  ibuprofen (ADVIL,MOTRIN) 200 MG tablet Take 400 mg by mouth every 6 (six) hours as needed for headache, mild pain or moderate pain.   Yes [provider]  traMADol (ULTRAM) 50 MG tablet Take 1 tablet (50 mg total) by mouth every 6 (six) hours as needed. Patient taking differently: Take 50 mg by mouth every 6 (six) hours as needed for moderate pain or severe pain.  08/18/16  Yes Pollina, [name], MD  butalbital-acetaminophen-caffeine (FIORICET, ESGIC) [phone] MG tablet Take 1 tablet by mouth every  6 (six) hours as needed for migraine. Patient not taking: Reported on 11/23/2016 11/11/16 11/11/17  Lawyer, [name], PA-C  cephALEXin (KEFLEX) 500 MG capsule Take 1 capsule (500 mg total) by mouth 4 (four) times daily. 12/08/16   [name], MD  cyclobenzaprine (FLEXERIL) 10 MG tablet Take 1 tablet (10 mg total) by mouth 2 (two) times daily as needed for muscle spasms. Patient not taking: Reported on 11/23/2016 09/09/16   Tegeler, [name], MD  diphenoxylate-atropine (LOMOTIL) 2.5-0.025 MG tablet Take 2 tablets by mouth 4 (four) times daily as needed for diarrhea or loose stools. Patient not taking: Reported on 11/23/2016 09/01/16   [name], MD  fluticasone [facility]) 50 MCG/ACT nasal spray Place 2 sprays into both nostrils daily. Patient not taking: Reported on 09/01/2016 07/25/16   Demetrios Loll T, PA-C  HYDROcodone-acetaminophen (NORCO/VICODIN) 5-325 MG tablet Take 1 tablet by mouth every 6 (six) hours as needed for moderate pain. 12/08/16   [name], MD  hydrOXYzine (ATARAX/VISTARIL) 25 MG tablet Take 1 tablet (25 mg total) by mouth every 6 (six) hours. Patient not taking: Reported on 09/01/2016 08/18/16   [name], MD  ibuprofen (ADVIL,MOTRIN) 800 MG tablet Take 1 tablet (800 mg total) by mouth 3 (three) times daily. Patient not taking: Reported on 11/23/2016 08/18/16   [name], MD  methocarbamol (ROBAXIN) 500 MG tablet Take 1 tablet (500 mg total) by  mouth 2 (two) times daily. Patient not taking: Reported on 09/09/2016 09/01/16   Gilda CreasePollina, [name], MD  promethazine (PHENERGAN) 25 MG tablet Take 1 tablet (25 mg total) by mouth every 6 (six) hours as needed for nausea or vomiting. Patient not taking: Reported on 09/09/2016 08/18/16   Gilda CreasePollina, [name], MD  promethazine (PHENERGAN) 25 MG tablet Take 1 tablet (25 mg total) by mouth every 6 (six) hours as needed for nausea or vomiting. Patient not taking: Reported on 11/23/2016 09/01/16   Gilda CreasePollina,  [name], MD  promethazine (PHENERGAN) 25 MG tablet Take 1 tablet (25 mg total) by mouth every 6 (six) hours as needed for nausea or vomiting. Patient not taking: Reported on 11/23/2016 09/09/16   Tegeler, Canary Brimhristopher J, MD    Family History History reviewed. No pertinent family history.  Social History Social History  Substance Use Topics  . Smoking status: Current Every Day Smoker    Packs/day: 0.50    Types: Cigarettes  . Smokeless tobacco: Never Used  . Alcohol use No     Comment: occ     Allergies   Other; Percocet [oxycodone-acetaminophen]; and Zofran [ondansetron hcl]   Review of Systems Review of Systems  Constitutional: Negative for appetite change and fatigue.  HENT: Negative for congestion, ear discharge and sinus pressure.   Eyes: Negative for discharge.  Respiratory: Negative for cough.   Cardiovascular: Negative for chest pain.  Gastrointestinal: Negative for abdominal pain and diarrhea.  Genitourinary: Positive for dysuria and flank pain. Negative for frequency and hematuria.  Musculoskeletal: Negative for back pain.  Skin: Negative for rash.  Neurological: Negative for seizures and headaches.  Psychiatric/Behavioral: Negative for hallucinations.     Physical Exam Updated Vital Signs BP 103/87 (BP Location: Left Arm)   Pulse (!) 109   Temp 98.2 F (36.8 C) (Oral)   SpO2 97%   Physical Exam  Constitutional: She is oriented to person, place, and time. She appears well-developed.  HENT:  Head: Normocephalic.  Eyes: Conjunctivae and EOM are normal. No scleral icterus.  Neck: Neck supple. No thyromegaly present.  Cardiovascular: Normal rate and regular rhythm.  Exam reveals no gallop and no friction rub.   No murmur heard. Pulmonary/Chest: No stridor. She has no wheezes. She has no rales. She exhibits no tenderness.  Abdominal: She exhibits no distension. There is no tenderness. There is no rebound.  Genitourinary:  Genitourinary Comments: Tender  left flank moderate  Musculoskeletal: Normal range of motion. She exhibits no edema.  Lymphadenopathy:    She has no cervical adenopathy.  Neurological: She is oriented to person, place, and time. She exhibits normal muscle tone. Coordination normal.  Skin: No rash noted. No erythema.  Psychiatric: She has a normal mood and affect. Her behavior is normal.     ED Treatments / Results  Labs (all labs ordered are listed, but only abnormal results are displayed) Labs Reviewed  URINALYSIS, ROUTINE W REFLEX MICROSCOPIC - Abnormal; Notable for the following:       Result Value   Leukocytes, UA TRACE (*)    Squamous Epithelial / LPF 0-5 (*)    All other components within normal limits  URINE CULTURE  CBC WITH DIFFERENTIAL/PLATELET  COMPREHENSIVE METABOLIC PANEL    EKG  EKG Interpretation None       Radiology Ct Renal Stone Study  Result Date: 12/08/2016 CLINICAL DATA:  51 year old female with left flank pain and dysuria starting 2 days ago. Initial encounter. EXAM: CT ABDOMEN AND PELVIS WITHOUT CONTRAST  TECHNIQUE: Multidetector CT imaging of the abdomen and pelvis was performed following the standard protocol without IV contrast. COMPARISON:  08/13/2015 FINDINGS: Lower chest: Minimal basilar atelectasis/ scarring. Heart size within normal limits. Hepatobiliary: Taking into account limitation by non contrast imaging, no worrisome hepatic lesion. Liver top-normal size. No calcified gallstone. Pancreas: Taking into account limitation by non contrast imaging, no mass or inflammation. Spleen: Taking into account limitation by non contrast imaging, no mass or enlargement. Adrenals/Urinary Tract: No renal or ureteral obstructing stone or hydronephrosis. Taking into account limitation by non contrast imaging, no renal or adrenal mass. Noncontrast filled views of the urinary bladder unremarkable. Stomach/Bowel: Under distended portions of stomach, small bowel and colon limited evaluation however, no  extraluminal bowel inflammatory process, free fluid or free air. Specifically, no inflammation surrounds the appendix or colonic diverticula. Circumferential thickening rectosigmoid region and gastric body may be related to under distension although difficult to exclude mild inflammation. Surrounding fat planes unremarkable. Vascular/Lymphatic: No abdominal aortic aneurysm. Focal calcification splenic hilum may represent simple vascular calcifications versus tiny 6 mm aneurysm without change compared to 08/13/2015. The splenic artery proximal to this region measures 5.4 mm. No adenopathy. Reproductive: No worrisome uterine or adnexal abnormality Other: No free air or abnormal fluid collection. No bowel containing hernia. Musculoskeletal: No osseous destructive lesion. Facet degenerative changes with bilateral L5-S1 foraminal narrowing. IMPRESSION: No renal or ureteral obstructing stone or evidence of hydronephrosis. No extraluminal bowel inflammatory process identified. Under distension of portions of the stomach, small bowel and colon limit evaluation. Stable focal calcification splenic hilum may represent simple vascular calcifications versus tiny 6 mm aneurysm. The splenic artery proximal to this region measures 5.4 mm. Electronically Signed   By: Lacy Duverney M.D.   On: 12/08/2016 10:25    Procedures Procedures (including critical care time)  Medications Ordered in ED Medications  fentaNYL (SUBLIMAZE) injection 50 mcg (50 mcg Nasal Given 12/08/16 0810)  cephALEXin (KEFLEX) capsule 500 mg (not administered)  HYDROmorphone (DILAUDID) injection 1 mg (1 mg Intravenous Given 12/08/16 1049)  prochlorperazine (COMPAZINE) injection 10 mg (10 mg Intravenous Given 12/08/16 1049)     Initial Impression / Assessment and Plan / ED Course  I have reviewed the triage vital signs and the nursing notes.  Pertinent labs & imaging results that were available during my care of the patient were reviewed by me and  considered in my medical decision making (see chart for details).     Patient has urinary tract infection that she will be treated with Keflex with. Urine culture done. Patient will follow-up with PCP  Final Clinical Impressions(s) / ED Diagnoses   Final diagnoses:  Acute cystitis without hematuria    New Prescriptions New Prescriptions   CEPHALEXIN (KEFLEX) 500 MG CAPSULE    Take 1 capsule (500 mg total) by mouth 4 (four) times daily.   HYDROCODONE-ACETAMINOPHEN (NORCO/VICODIN) 5-325 MG TABLET    Take 1 tablet by mouth every 6 (six) hours as needed for moderate pain.     [name], MD 12/08/16 1226

## 2016-12-08 NOTE — ED Triage Notes (Signed)
Pt c/o stabbing left flank pain and dysuria onset 2 days ago, hx of renal calculi, reports this feels similar. No hematuria.

## 2016-12-09 LAB — URINE CULTURE

## 2016-12-09 MED ORDER — CEPHALEXIN 500 MG PO CAPS: 500.0000 mg | ORAL_CAPSULE | Freq: Four times a day (QID) | ORAL | 0 refills | Status: DC

## 2016-12-09 NOTE — ED Provider Notes (Signed)
Notified by pharmacy that patient lost Keflex Rx. Reviewed labs, records. Multiple species on UCx but given initial decision to treat, will prescribe Keflex. Pt has no relevant allergies. Pharmacy aware.   Shaune PollackIsaacs, Stepahnie Campo, MD 12/09/16 [phone]

## 2017-01-03 ENCOUNTER — Encounter (HOSPITAL_COMMUNITY): Payer: Self-pay | Admitting: Emergency Medicine

## 2017-01-03 ENCOUNTER — Emergency Department (HOSPITAL_COMMUNITY)
Admission: EM | Admit: 2017-01-03 | Discharge: 2017-01-03 | Disposition: A | Discharge status: Home / Self Care | Payer: Self-pay | Attending: Emergency Medicine | Admitting: Emergency Medicine

## 2017-01-03 DIAGNOSIS — R110 Nausea: Secondary | ICD-10-CM | POA: Insufficient documentation

## 2017-01-03 DIAGNOSIS — R102 Pelvic and perineal pain: Secondary | ICD-10-CM

## 2017-01-03 DIAGNOSIS — M545 Low back pain, unspecified: Secondary | ICD-10-CM

## 2017-01-03 DIAGNOSIS — Z79899 Other long term (current) drug therapy: Secondary | ICD-10-CM | POA: Insufficient documentation

## 2017-01-03 DIAGNOSIS — R1030 Lower abdominal pain, unspecified: Secondary | ICD-10-CM | POA: Insufficient documentation

## 2017-01-03 DIAGNOSIS — F17210 Nicotine dependence, cigarettes, uncomplicated: Secondary | ICD-10-CM | POA: Insufficient documentation

## 2017-01-03 DIAGNOSIS — R300 Dysuria: Secondary | ICD-10-CM | POA: Insufficient documentation

## 2017-01-03 HISTORY — DX: Urinary tract infection, site not specified: N39.0

## 2017-01-03 HISTORY — DX: Sciatica, unspecified side: M54.30

## 2017-01-03 LAB — URINALYSIS, ROUTINE W REFLEX MICROSCOPIC
Bilirubin Urine: NEGATIVE
Glucose, UA: NEGATIVE mg/dL
Hgb urine dipstick: NEGATIVE
Ketones, ur: NEGATIVE mg/dL
Leukocytes, UA: NEGATIVE
NITRITE: NEGATIVE
PH: 5.5 (ref 5.0–8.0)
PROTEIN: NEGATIVE mg/dL
Specific Gravity, Urine: >1.03 — ABNORMAL HIGH (ref 1.005–1.030)

## 2017-01-03 MED ORDER — NAPROXEN 500 MG PO TABS: 500.0000 mg | ORAL_TABLET | Freq: Two times a day (BID) | ORAL | 0 refills | Status: DC

## 2017-01-03 MED ORDER — PROMETHAZINE HCL 25 MG PO TABS: 25.0000 mg | ORAL_TABLET | Freq: Four times a day (QID) | ORAL | 0 refills | Status: DC | PRN

## 2017-01-03 MED ORDER — NAPROXEN 500 MG PO TABS
500.0000 mg | ORAL_TABLET | Freq: Once | ORAL | Status: AC
  Administered 2017-01-03: 500 mg via ORAL
  Filled 2017-01-03: qty 1

## 2017-01-03 NOTE — ED Provider Notes (Signed)
[facility]-EMERGENCY DEPT Provider Note   CSN: [id number] Arrival date & time: 01/03/17  5621  By signing my name below, I, [name], attest that this documentation has been prepared under the direction and in the presence of [employer], PA-C. Electronically Signed: [name], Scribe. 01/03/2017. 10:19 AM.  History   Chief Complaint Chief Complaint  Patient presents with  . Recurrent UTI  . Back Pain   The history is provided by the patient. No language interpreter was used.    HPI Comments: Kerry Young is a 51 y.o. female with PMHx inluding recurrent UTI's who presents to the Emergency Department complaining of persistent dysuria for about one month. She reports some associated cramping lower abdominal "pressure" and aching bilateral flank pain. Patient was seen here on 5/22 for the same and was discharged with Keflex, but has been unable to complete the course because it makes her nauseous. She reports shetook about 3 days of Keflex. She states she has been taking Tylenol and Advil without improvement of her flank pain. Patient notes she has been trying to hydrate well lately but her urine is progressively becoming darker. She is post-menopausal. She denies vaginal discharge, fevers, chills, chest pain, dyspnea, hematuria, vomiting, or any other associated symptoms.  Past Medical History:  Diagnosis Date  . Anxiety   . Migraines   . Sciatica   . UTI (urinary tract infection)     There are no active problems to display for this patient.   History reviewed. No pertinent surgical history.  OB History    No data available       Home Medications    Prior to Admission medications   Medication Sig Start Date End Date Taking? Authorizing Provider  acetaminophen (TYLENOL) 500 MG tablet Take 1,000 mg by mouth every 6 (six) hours as needed for moderate pain.    [provider]  ALPRAZolam Prudy Feeler) 1 MG tablet Take 0.5-1 mg by mouth 2 (two) times daily as needed for  anxiety.     [provider]  butalbital-acetaminophen-caffeine (FIORICET, ESGIC) 50-325-40 MG tablet Take 1 tablet by mouth every 6 (six) hours as needed for migraine. Patient not taking: Reported on 11/23/2016 11/11/16 11/11/17  Lawyer, [name], PA-C  cephALEXin (KEFLEX) 500 MG capsule Take 1 capsule (500 mg total) by mouth 4 (four) times daily. 12/09/16   Shaune Pollack, MD  cyclobenzaprine (FLEXERIL) 10 MG tablet Take 1 tablet (10 mg total) by mouth 2 (two) times daily as needed for muscle spasms. Patient not taking: Reported on 11/23/2016 09/09/16   Tegeler, [name], MD  diphenhydrAMINE (BENADRYL) 25 mg capsule Take 50 mg by mouth every 6 (six) hours as needed.    [provider]  diphenoxylate-atropine (LOMOTIL) 2.5-0.025 MG tablet Take 2 tablets by mouth 4 (four) times daily as needed for diarrhea or loose stools. Patient not taking: Reported on 11/23/2016 09/01/16   [name], MD  fluticasone [facility]) 50 MCG/ACT nasal spray Place 2 sprays into both nostrils daily. Patient not taking: Reported on 09/01/2016 07/25/16   Demetrios Loll T, PA-C  HYDROcodone-acetaminophen (NORCO/VICODIN) 5-325 MG tablet Take 1 tablet by mouth every 6 (six) hours as needed for moderate pain. 12/08/16   [name], MD  hydrOXYzine (ATARAX/VISTARIL) 25 MG tablet Take 1 tablet (25 mg total) by mouth every 6 (six) hours. Patient not taking: Reported on 09/01/2016 08/18/16   [name], MD  ibuprofen (ADVIL,MOTRIN) 200 MG tablet Take 400 mg by mouth every 6 (six) hours as  needed for headache, mild pain or moderate pain.    [provider]  ibuprofen (ADVIL,MOTRIN) 800 MG tablet Take 1 tablet (800 mg total) by mouth 3 (three) times daily. Patient not taking: Reported on 11/23/2016 08/18/16   Gilda CreasePollina, [name], MD  methocarbamol (ROBAXIN) 500 MG tablet Take 1 tablet (500 mg total) by mouth 2 (two) times daily. Patient not taking: Reported on 09/09/2016 09/01/16    Gilda CreasePollina, [name], MD  naproxen (NAPROSYN) 500 MG tablet Take 1 tablet (500 mg total) by mouth 2 (two) times daily. 01/03/17   [name], [name], PA-C  promethazine (PHENERGAN) 25 MG tablet Take 1 tablet (25 mg total) by mouth every 6 (six) hours as needed for nausea or vomiting. 01/03/17   [name], [name], PA-C  traMADol (ULTRAM) 50 MG tablet Take 1 tablet (50 mg total) by mouth every 6 (six) hours as needed. Patient taking differently: Take 50 mg by mouth every 6 (six) hours as needed for moderate pain or severe pain.  08/18/16   Gilda CreasePollina, [name], MD    Family History History reviewed. No pertinent family history.  Social History Social History  Substance Use Topics  . Smoking status: Current Every Day Smoker    Packs/day: 0.50    Types: Cigarettes  . Smokeless tobacco: Never Used  . Alcohol use No     Comment: occ     Allergies   Other; Percocet [oxycodone-acetaminophen]; and Zofran [ondansetron hcl]   Review of Systems Review of Systems  Constitutional: Negative for chills and fever.  Respiratory: Negative for shortness of breath.   Cardiovascular: Negative for chest pain.  Gastrointestinal: Positive for abdominal pain and nausea. Negative for vomiting.  Genitourinary: Positive for dysuria and flank pain. Negative for hematuria, vaginal bleeding and vaginal discharge.   Physical Exam Updated Vital Signs BP 103/80 (BP Location: Right Arm)   Pulse 70   Temp 97.6 F (36.4 C) (Oral)   Wt 78 kg (172 lb)   LMP  (Exact Date)   SpO2 99%   BMI 31.46 kg/m   Physical Exam  Constitutional: She appears well-developed and well-nourished. No distress.  HENT:  Head: Normocephalic and atraumatic.  Mouth/Throat: Oropharynx is clear and moist. No oropharyngeal exudate.  Eyes: Conjunctivae are normal. Pupils are equal, round, and reactive to light. Right eye exhibits no discharge. Left eye exhibits no discharge. No scleral icterus.  Neck: Normal range of motion. Neck  supple. No thyromegaly present.  Cardiovascular: Normal rate, regular rhythm, normal heart sounds and intact distal pulses.  Exam reveals no gallop and no friction rub.   No murmur heard. Pulmonary/Chest: Effort normal and breath sounds normal. No stridor. No respiratory distress. She has no wheezes. She has no rales.  Abdominal: Soft. Bowel sounds are normal. She exhibits no distension. There is tenderness. There is no rebound, no guarding and no CVA tenderness.  Suprapubic pressure on palpation. Bilateral flank tenderness to palpation. No CVA tenderness bilaterally.   Musculoskeletal: She exhibits no edema.  No midline lumbar tenderness  Lymphadenopathy:    She has no cervical adenopathy.  Neurological: She is alert. Coordination normal.  Skin: Skin is warm and dry. No rash noted. She is not diaphoretic. No pallor.  Psychiatric: She has a normal mood and affect.  Nursing note and vitals reviewed.  ED Treatments / Results  Labs (all labs ordered are listed, but only abnormal results are displayed) Labs Reviewed  URINALYSIS, ROUTINE W REFLEX MICROSCOPIC - Abnormal; Notable for the following:  Result Value   APPearance HAZY (*)    Specific Gravity, Urine >1.030 (*)    All other components within normal limits  URINE CULTURE    EKG  EKG Interpretation None       Radiology No results found.  Procedures Procedures (including critical care time)  DIAGNOSTIC STUDIES: Oxygen Saturation is 97% on RA, normal by my interpretation.    COORDINATION OF CARE: 10:15 AM Discussed treatment plan with pt at bedside and pt agreed to plan.  Medications Ordered in ED Medications  naproxen (NAPROSYN) tablet 500 mg (500 mg Oral Given 01/03/17 1026)     Initial Impression / Assessment and Plan / ED Course  I have reviewed the triage vital signs and the nursing notes.  Pertinent labs & imaging results that were available during my care of the patient were reviewed by me and  considered in my medical decision making (see chart for details).     Patient with negative UA. Urine culture sent. No indication for an about a treatment at this time. Will treat symptomatically with follow-up to urology. Question interstitial cystitis. Patient is well-appearing with stable vitals. Return precautions discussed. Patient understands and agrees with plan. I discussed patient case with Dr. Particia Nearing who guided the patient's management and agrees with plan.   Final Clinical Impressions(s) / ED Diagnoses   Final diagnoses:  Dysuria  Acute bilateral low back pain without sciatica  Suprapubic pressure  Nausea    New Prescriptions Discharge Medication List as of 01/03/2017 10:53 AM    START taking these medications   Details  naproxen (NAPROSYN) 500 MG tablet Take 1 tablet (500 mg total) by mouth 2 (two) times daily., Starting Sun 01/03/2017, Print       I personally performed the services described in this documentation, which was scribed in my presence. The recorded information has been reviewed and is accurate.    Emi Holes, PA-C 01/03/17 1124    Jacalyn Lefevre, MD 01/03/17 1241

## 2017-01-03 NOTE — Discharge Instructions (Signed)
Medications: Naprosyn, Phenergan  Treatment: Take Naprosyn twice daily as prescribed. You can alternate with Tylenol as prescribed over-the-counter. Take one half to one tablet Phenergan every 6 hours as needed for nausea or vomiting. Stay well-hydrated. He needs a heating pad to your back 3-4 times daily alternating 20 minutes on, 20 minutes off.  Follow-up: Please follow-up with the urologist, Dr. Judie GrieveLomboy, as soon as possible for further evaluation and treatment of your symptoms. Please return to emergency department if you develop any new or worsening symptoms.

## 2017-01-03 NOTE — ED Notes (Signed)
Bed: WTR5 Expected date:  Expected time:  Means of arrival:  Comments: 

## 2017-01-03 NOTE — ED Triage Notes (Addendum)
Burning and pressure on urination x 1 month.Pt reports recurrent bladder pressure. Has not been seen by PCP after last visit to ED. Pt did not complete antibiotics as prescribed due to nausea. Pt also c/o recurrent low back pain, unresponsive to OTC meds

## 2017-01-04 LAB — URINE CULTURE: Special Requests: NORMAL

## 2017-01-09 ENCOUNTER — Encounter (HOSPITAL_COMMUNITY): Payer: Self-pay | Admitting: *Deleted

## 2017-01-09 ENCOUNTER — Emergency Department (HOSPITAL_COMMUNITY)
Admission: EM | Admit: 2017-01-09 | Discharge: 2017-01-09 | Disposition: A | Discharge status: Home / Self Care | Payer: Self-pay | Attending: Emergency Medicine | Admitting: Emergency Medicine

## 2017-01-09 DIAGNOSIS — F419 Anxiety disorder, unspecified: Secondary | ICD-10-CM | POA: Insufficient documentation

## 2017-01-09 DIAGNOSIS — F17210 Nicotine dependence, cigarettes, uncomplicated: Secondary | ICD-10-CM | POA: Insufficient documentation

## 2017-01-09 DIAGNOSIS — M5432 Sciatica, left side: Secondary | ICD-10-CM | POA: Insufficient documentation

## 2017-01-09 MED ORDER — TRAMADOL HCL 50 MG PO TABS: 50.0000 mg | ORAL_TABLET | Freq: Four times a day (QID) | ORAL | 0 refills | Status: DC | PRN

## 2017-01-09 MED ORDER — TRAMADOL HCL 50 MG PO TABS
50.0000 mg | ORAL_TABLET | Freq: Once | ORAL | Status: AC
  Administered 2017-01-09: 50 mg via ORAL
  Filled 2017-01-09: qty 1

## 2017-01-09 NOTE — ED Provider Notes (Signed)
[facility] DEPT Provider Note   By signing my name below, I, Earmon PhoenixJennifer Waddell, attest that this documentation has been prepared under the direction and in the presence of Demetrios LollKenneth Concetta Guion, PA-C. Electronically Signed: Earmon PhoenixJennifer Waddell, ED Scribe. 01/09/17. 9:25 AM.     History   Chief Complaint Chief Complaint  Patient presents with  . Leg Pain   HPI  Kerry Young is a 51 y.o. female with PMHx of sciatica who presents to the Emergency Department complaining of an exacerbation of her normal sciatic pain that began about one week ago. She reports associated pain in the left lower back that radiates down her posterior LLE to her foot. She has been taking Naproxen for pain but states it makes her nauseous so she cannot take it. She has been taking Phenergan as well with no significant relief. [facility] and was prescribed the medications. She is requesting a prescription for Vicodin stating that is the only thing that normally helps. There are no modifying factors noted. She denies bowel or bladder incontinence, saddle anesthesia, urinary retention, numbness, urinary symptoms, tingling or weakness of any extremity, fever, chills. She denies any trauma, injury or fall.   Past Medical History:  Diagnosis Date  . Anxiety   . Migraines   . Sciatica   . UTI (urinary tract infection)     There are no active problems to display for this patient.   History reviewed. No pertinent surgical history.  OB History    No data available       Home Medications    Prior to Admission medications   Medication Sig Start Date End Date Taking? Authorizing Provider  acetaminophen (TYLENOL) 500 MG tablet Take 1,000 mg by mouth every 6 (six) hours as needed for moderate pain.    [provider]  ALPRAZolam Prudy Feeler(XANAX) 1 MG tablet Take 0.5-1 mg by mouth 2 (two) times daily as needed for anxiety.     [provider]  butalbital-acetaminophen-caffeine (FIORICET,  ESGIC) 50-325-40 MG tablet Take 1 tablet by mouth every 6 (six) hours as needed for migraine. Patient not taking: Reported on 11/23/2016 11/11/16 11/11/17  Lawyer, Cristal Deerhristopher, PA-C  cephALEXin (KEFLEX) 500 MG capsule Take 1 capsule (500 mg total) by mouth 4 (four) times daily. 12/09/16   Shaune PollackIsaacs, Cameron, MD  cyclobenzaprine (FLEXERIL) 10 MG tablet Take 1 tablet (10 mg total) by mouth 2 (two) times daily as needed for muscle spasms. Patient not taking: Reported on 11/23/2016 09/09/16   Tegeler, Canary Brimhristopher J, MD  diphenhydrAMINE (BENADRYL) 25 mg capsule Take 50 mg by mouth every 6 (six) hours as needed.    [provider]  diphenoxylate-atropine (LOMOTIL) 2.5-0.025 MG tablet Take 2 tablets by mouth 4 (four) times daily as needed for diarrhea or loose stools. Patient not taking: Reported on 11/23/2016 09/01/16   Gilda CreasePollina, [name], MD  fluticasone [facility](FLONASE) 50 MCG/ACT nasal spray Place 2 sprays into both nostrils daily. Patient not taking: Reported on 09/01/2016 07/25/16   Demetrios LollLeaphart, Keylor Rands T, PA-C  HYDROcodone-acetaminophen (NORCO/VICODIN) 5-325 MG tablet Take 1 tablet by mouth every 6 (six) hours as needed for moderate pain. 12/08/16   Bethann BerkshireZammit, Joseph, MD  hydrOXYzine (ATARAX/VISTARIL) 25 MG tablet Take 1 tablet (25 mg total) by mouth every 6 (six) hours. Patient not taking: Reported on 09/01/2016 08/18/16   Gilda CreasePollina, [name], MD  ibuprofen (ADVIL,MOTRIN) 200 MG tablet Take 400 mg by mouth every 6 (six) hours as needed for headache, mild pain or moderate  pain.    [provider]  ibuprofen (ADVIL,MOTRIN) 800 MG tablet Take 1 tablet (800 mg total) by mouth 3 (three) times daily. Patient not taking: Reported on 11/23/2016 08/18/16   [name], MD  methocarbamol (ROBAXIN) 500 MG tablet Take 1 tablet (500 mg total) by mouth 2 (two) times daily. Patient not taking: Reported on 09/09/2016 09/01/16   [name], MD  naproxen (NAPROSYN) 500 MG tablet Take 1 tablet (500 mg  total) by mouth 2 (two) times daily. 01/03/17   Law, Waylan Boga, PA-C  promethazine (PHENERGAN) 25 MG tablet Take 1 tablet (25 mg total) by mouth every 6 (six) hours as needed for nausea or vomiting. 01/03/17   Law, Waylan Boga, PA-C  traMADol (ULTRAM) 50 MG tablet Take 1 tablet (50 mg total) by mouth every 6 (six) hours as needed. 01/09/17   Rise Mu, PA-C    Family History No family history on file.  Social History Social History  Substance Use Topics  . Smoking status: Current Every Day Smoker    Packs/day: 0.50    Types: Cigarettes  . Smokeless tobacco: Never Used  . Alcohol use No     Comment: occ     Allergies   Other; Percocet [oxycodone-acetaminophen]; and Zofran [ondansetron hcl]   Review of Systems Review of Systems  Constitutional: Negative for chills and fever.  Gastrointestinal: Negative for nausea and vomiting.       No bowel or bladder incontinence  Musculoskeletal: Positive for back pain.  Neurological: Negative for weakness and numbness.     Physical Exam Updated Vital Signs BP 101/80 (BP Location: Left Arm)   Pulse 94   Temp 98.1 F (36.7 C)   Resp 16   SpO2 98%   Physical Exam  Constitutional: She is oriented to person, place, and time. She appears well-developed and well-nourished.  HENT:  Head: Normocephalic and atraumatic.  Neck: Normal range of motion.  Cardiovascular: Normal rate and intact distal pulses.   Pulmonary/Chest: Effort normal.  Abdominal:  No CVA tenderness  Musculoskeletal: Normal range of motion.  No midline T spine or L spine tenderness. No deformities or step offs noted. Full ROM. Pelvis is stable.  Patient with left-sided paraspinal lumbar tenderness that radiates down her left buttocks into her left leg. Positive straight leg raise test the left. Tense musculature noted. DP pulses are 2+ bilaterally. Cap refill normal. Patient able to ambulate with normal gait.   Neurological: She is alert and oriented to  person, place, and time.  Strength 5 out of 5 in lower extremities bilaterally. Patellar reflexes normal. Sensation intact sharp dull.  Skin: Skin is warm and dry. Capillary refill takes less than 2 seconds.  Psychiatric: She has a normal mood and affect. Her behavior is normal.  Nursing note and vitals reviewed.    ED Treatments / Results  DIAGNOSTIC STUDIES: Oxygen Saturation is 98% on RA, normal by my interpretation.   COORDINATION OF CARE: 9:16 AM- Ether Griffins prescribe Tramadol and encouraged referral to [facility]. Will provide work note. Informed pt that Vicodin would not be prescribed from the ED. Pt verbalizes understanding and agrees to plan.  Medications  traMADol (ULTRAM) tablet 50 mg (50 mg Oral Given 01/09/17 0927)    Labs (all labs ordered are listed, but only abnormal results are displayed) Labs Reviewed - No data to display  EKG  EKG Interpretation None       Radiology No results found.  Procedures Procedures (including critical care time)  Medications Ordered in ED Medications  traMADol (ULTRAM) tablet 50 mg (not administered)     Initial Impression / Assessment and Plan / ED Course  I have reviewed the triage vital signs and the nursing notes.  Pertinent labs & imaging results that were available during my care of the patient were reviewed by me and considered in my medical decision making (see chart for details).     Patient with exacerbation of chronic sciatica that began about one week ago. She was seen at [facility] 6 days ago and was prescribed Naproxen and Phenergan which she states makes her nauseous she has not been taking. No neurological deficits and normal neuro exam. Patient is ambulatory. No loss of bowel or bladder control. No concern for cauda equina. No fever, night sweats, weight loss, h/o cancer, IVDA, no recent procedure to back. No urinary symptoms suggestive of UTI. Supportive care and return precaution discussed. Appears safe for discharge at  this time. Follow up as indicated in discharge paperwork.   I have reviewed the patient's information in the [place] Controlled Substance Database for the past 12 months and found them to have xanax and one 10 days supply of hydrocodone. Do not feel like pt needs hydrodocodone.      Final Clinical Impressions(s) / ED Diagnoses   Final diagnoses:  Sciatic pain, left    New Prescriptions New Prescriptions   TRAMADOL (ULTRAM) 50 MG TABLET    Take 1 tablet (50 mg total) by mouth every 6 (six) hours as needed.   I personally performed the services described in this documentation, which was scribed in my presence. The recorded information has been reviewed and is accurate.     Rise Mu, PA-C 01/09/17 0949    Lavera Guise, MD 01/09/17 [phone]

## 2017-01-09 NOTE — ED Triage Notes (Signed)
To ED for new pain med. States she has hx of sciatica and given Naproxen a few days ago [facility]. Making pt sick and unable to take. States the only med that works is Hydrocodone. Pt ambulatory. No recent injury

## 2017-01-09 NOTE — Discharge Instructions (Signed)
Please take the meds as prescribed.  May alternate with Motrin and Tylenol. Make sure you follow up with primary care doctor. Workup has been normal. Please take medications as prescribed and instructed.  SEEK IMMEDIATE MEDICAL ATTENTION IF: New numbness, tingling, weakness, or problem with the use of your arms or legs.  Severe back pain not relieved with medications.  Change in bowel or bladder control.  Urinary retention.  Numbness in your groin.  Increasing pain in any areas of the body (such as chest or abdominal pain).  Shortness of breath, dizziness or fainting.  Nausea (feeling sick to your stomach), vomiting, fever, or sweats.

## 2017-03-10 ENCOUNTER — Encounter (HOSPITAL_COMMUNITY): Payer: Self-pay | Admitting: Emergency Medicine

## 2017-03-10 ENCOUNTER — Emergency Department (HOSPITAL_COMMUNITY): Admission: EM | Admit: 2017-03-10 | Discharge: 2017-03-10 | Discharge status: Against Medical Advice | Payer: Self-pay

## 2017-03-10 ENCOUNTER — Emergency Department (HOSPITAL_COMMUNITY)
Admission: EM | Admit: 2017-03-10 | Discharge: 2017-03-10 | Disposition: A | Discharge status: Home / Self Care | Payer: Self-pay | Attending: Emergency Medicine | Admitting: Emergency Medicine

## 2017-03-10 DIAGNOSIS — M5432 Sciatica, left side: Secondary | ICD-10-CM | POA: Insufficient documentation

## 2017-03-10 DIAGNOSIS — Z79899 Other long term (current) drug therapy: Secondary | ICD-10-CM | POA: Insufficient documentation

## 2017-03-10 DIAGNOSIS — F17210 Nicotine dependence, cigarettes, uncomplicated: Secondary | ICD-10-CM | POA: Insufficient documentation

## 2017-03-10 DIAGNOSIS — Z791 Long term (current) use of non-steroidal anti-inflammatories (NSAID): Secondary | ICD-10-CM | POA: Insufficient documentation

## 2017-03-10 MED ORDER — TRAMADOL HCL 50 MG PO TABS: 50.0000 mg | ORAL_TABLET | Freq: Two times a day (BID) | ORAL | 0 refills | Status: DC | PRN

## 2017-03-10 MED ORDER — PREDNISONE 20 MG PO TABS
60.0000 mg | ORAL_TABLET | Freq: Once | ORAL | Status: AC
  Administered 2017-03-10: 60 mg via ORAL
  Filled 2017-03-10: qty 3

## 2017-03-10 MED ORDER — TRAMADOL HCL 50 MG PO TABS
50.0000 mg | ORAL_TABLET | Freq: Once | ORAL | Status: AC
  Administered 2017-03-10: 50 mg via ORAL

## 2017-03-10 MED ORDER — METHOCARBAMOL 500 MG PO TABS: 500.0000 mg | ORAL_TABLET | Freq: Two times a day (BID) | ORAL | 0 refills | Status: DC

## 2017-03-10 MED ORDER — PREDNISONE 20 MG PO TABS: ORAL_TABLET | ORAL | 0 refills | Status: DC

## 2017-03-10 MED ORDER — TRAMADOL HCL 50 MG PO TABS: 50.0000 mg | ORAL_TABLET | Freq: Four times a day (QID) | ORAL | 0 refills | Status: DC | PRN

## 2017-03-10 MED ORDER — METHOCARBAMOL 500 MG PO TABS
500.0000 mg | ORAL_TABLET | Freq: Three times a day (TID) | ORAL | Status: DC | PRN
  Administered 2017-03-10: 500 mg via ORAL
  Filled 2017-03-10: qty 1

## 2017-03-10 MED ORDER — METHOCARBAMOL 500 MG PO TABS: 500.0000 mg | ORAL_TABLET | Freq: Three times a day (TID) | ORAL | 0 refills | Status: DC | PRN

## 2017-03-10 NOTE — ED Provider Notes (Signed)
[facility] Provider Note   CSN: [id number] Arrival date & time: 03/10/17  1324     History   Chief Complaint Chief Complaint  Patient presents with  . Back Pain    HPI Kerry Young is a 51 y.o. female.  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  She denies any loss of bowel or bladder.  No foot drop.  No decrease in sensation.      Past Medical History:  Diagnosis Date  . Anxiety   . Migraines   . Sciatica   . UTI (urinary tract infection)     There are no active problems to display for this patient.   No past surgical history on file.  OB History    No data available       Home Medications    Prior to Admission medications   Medication Sig Start Date End Date Taking? Authorizing Provider  acetaminophen (TYLENOL) 500 MG tablet Take 1,000 mg by mouth every 6 (six) hours as needed for moderate pain.    [provider]  ALPRAZolam Prudy Feeler) 1 MG tablet Take 0.5-1 mg by mouth 2 (two) times daily as needed for anxiety.     [provider]  butalbital-acetaminophen-caffeine (FIORICET, ESGIC) 50-325-40 MG tablet Take 1 tablet by mouth every 6 (six) hours as needed for migraine. Patient not taking: Reported on 11/23/2016 11/11/16 11/11/17  Lawyer, [name], PA-C  cephALEXin (KEFLEX) 500 MG capsule Take 1 capsule (500 mg total) by mouth 4 (four) times daily. 12/09/16   Shaune Pollack, MD  cyclobenzaprine (FLEXERIL) 10 MG tablet Take 1 tablet (10 mg total) by mouth 2 (two) times daily as needed for muscle spasms. Patient not taking: Reported on 11/23/2016 09/09/16   Tegeler, [name], MD  diphenhydrAMINE (BENADRYL) 25 mg capsule Take 50 mg by mouth every 6 (six) hours as needed.    [provider]  diphenoxylate-atropine (LOMOTIL) 2.5-0.025 MG tablet Take 2  tablets by mouth 4 (four) times daily as needed for diarrhea or loose stools. Patient not taking: Reported on 11/23/2016 09/01/16   [name], MD  fluticasone [facility]) 50 MCG/ACT nasal spray Place 2 sprays into both nostrils daily. Patient not taking: Reported on 09/01/2016 07/25/16   Demetrios Loll T, PA-C  HYDROcodone-acetaminophen (NORCO/VICODIN) 5-325 MG tablet Take 1 tablet by mouth every 6 (six) hours as needed for moderate pain. 12/08/16   [name], MD  hydrOXYzine (ATARAX/VISTARIL) 25 MG tablet Take 1 tablet (25 mg total) by mouth every 6 (six) hours. Patient not taking: Reported on 09/01/2016 08/18/16   [name], MD  ibuprofen (ADVIL,MOTRIN) 200 MG tablet Take 400 mg by mouth every 6 (six) hours as needed for headache, mild pain or moderate pain.    [provider]  ibuprofen (ADVIL,MOTRIN) 800 MG tablet Take 1 tablet (800 mg total) by mouth 3 (three) times daily. Patient not taking: Reported on 11/23/2016 08/18/16   [name], MD  methocarbamol (ROBAXIN) 500 MG tablet Take 1 tablet (500 mg total) by mouth 2 (two) times daily. 03/10/17   Earley Favor, NP  methocarbamol (ROBAXIN) 500 MG tablet Take 1 tablet (500 mg total) by mouth every 8 (eight) hours as needed for muscle spasms. 03/10/17   Earley Favor, NP  naproxen (NAPROSYN) 500 MG tablet Take  1 tablet (500 mg total) by mouth 2 (two) times daily. 01/03/17   Law, Waylan Boga, PA-C  predniSONE (DELTASONE) 20 MG tablet 3 Tabs PO Days 1-3, then 2 tabs PO Days 4-6, then 1 tab PO Day 7-9, then Half Tab PO Day 10-12 03/10/17   Earley Favor, NP  promethazine (PHENERGAN) 25 MG tablet Take 1 tablet (25 mg total) by mouth every 6 (six) hours as needed for nausea or vomiting. 01/03/17   Law, Waylan Boga, PA-C  traMADol (ULTRAM) 50 MG tablet Take 1 tablet (50 mg total) by mouth every 12 (twelve) hours as needed for severe pain. 03/10/17   Earley Favor, NP  traMADol (ULTRAM) 50 MG tablet Take 1 tablet (50 mg  total) by mouth every 6 (six) hours as needed. 03/10/17   Earley Favor, NP    Family History No family history on file.  Social History Social History  Substance Use Topics  . Smoking status: Current Every Day Smoker    Packs/day: 0.50    Types: Cigarettes  . Smokeless tobacco: Never Used  . Alcohol use No     Comment: occ     Allergies   Other; Percocet [oxycodone-acetaminophen]; and Zofran [ondansetron hcl]   Review of Systems Review of Systems  Constitutional: Negative for fever.  Respiratory: Negative for shortness of breath.   Musculoskeletal: Positive for back pain.  Skin: Negative for rash.  Neurological: Negative for weakness and numbness.  All other systems reviewed and are negative.    Physical Exam Updated Vital Signs BP 104/75   Pulse 79   Temp 97.9 F (36.6 C)   Resp 18   SpO2 100%   Physical Exam  Constitutional: She appears well-developed and well-nourished. No distress.  Eyes: Pupils are equal, round, and reactive to light.  Neck: Normal range of motion.  Cardiovascular: Normal rate.   Pulmonary/Chest: Effort normal.  Abdominal: Soft.  Musculoskeletal: Normal range of motion.       Back:       Legs: Neurological: She is alert.  Skin: Skin is warm.  Psychiatric: She has a normal mood and affect.  Nursing note and vitals reviewed.    ED Treatments / Results  Labs (all labs ordered are listed, but only abnormal results are displayed) Labs Reviewed - No data to display  EKG  EKG Interpretation None       Radiology No results found.  Procedures Procedures (including critical care time)  Medications Ordered in ED Medications  methocarbamol (ROBAXIN) tablet 500 mg (not administered)  traMADol (ULTRAM) tablet 50 mg (not administered)  predniSONE (DELTASONE) tablet 60 mg (not administered)     Initial Impression / Assessment and Plan / ED Course  I have reviewed the triage vital signs and the nursing notes.  Pertinent labs  & imaging results that were available during my care of the patient were reviewed by me and considered in my medical decision making (see chart for details).     No imaging needed at this time.  Patient will be placed on steroids for a 12 day taper.  Muscle relaxer for for 5 days tramadol at night.  Follow-up with orthopedics or neurosurgery if symptoms worsen  Final Clinical Impressions(s) / ED Diagnoses   Final diagnoses:  Sciatica of left side    New Prescriptions New Prescriptions   METHOCARBAMOL (ROBAXIN) 500 MG TABLET    Take 1 tablet (500 mg total) by mouth 2 (two) times daily.   PREDNISONE (DELTASONE) 20 MG TABLET  3 Tabs PO Days 1-3, then 2 tabs PO Days 4-6, then 1 tab PO Day 7-9, then Half Tab PO Day 10-12   TRAMADOL (ULTRAM) 50 MG TABLET    Take 1 tablet (50 mg total) by mouth every 12 (twelve) hours as needed for severe pain.     Earley Favor, NP 03/10/17 1638    Shon Baton, MD 03/10/17 [phone]

## 2017-03-10 NOTE — ED Notes (Signed)
Med ordered from pharmacy.

## 2017-03-10 NOTE — Discharge Instructions (Signed)
You have been started on a steroid, which is a very potent anti-inflammatory, please do not take any Advil or ibuprofen with this medication, you be given a muscle relaxer called Robaxin.  Please take this as directed as well as tramadol, which she can use for severe pain.  I would like you use it at night so you can rest and only during the day.  If absolute necessary.  You be given referrals to neurosurgery, i.e., Dr. Mikal Plane were orthopedic, i.e. Dr. Eulah Pont if symptoms worsen.  Otherwise, follow-up with your primary care physician

## 2017-03-10 NOTE — ED Triage Notes (Addendum)
Pt states 24 hours of sharp shooting pain from left lower back/buttocks that shoots down into leg into foot. Pt states she began feeling this at work, where she bends and lifts a lot. HX of this happening before to same side.  Hx of sciatica. Ambulatory at triage.

## 2017-04-25 ENCOUNTER — Emergency Department (HOSPITAL_COMMUNITY)
Admission: EM | Admit: 2017-04-25 | Discharge: 2017-04-25 | Disposition: A | Discharge status: Home / Self Care | Payer: Self-pay | Attending: Emergency Medicine | Admitting: Emergency Medicine

## 2017-04-25 ENCOUNTER — Encounter (HOSPITAL_COMMUNITY): Payer: Self-pay | Admitting: *Deleted

## 2017-04-25 DIAGNOSIS — K529 Noninfective gastroenteritis and colitis, unspecified: Secondary | ICD-10-CM

## 2017-04-25 DIAGNOSIS — F17210 Nicotine dependence, cigarettes, uncomplicated: Secondary | ICD-10-CM | POA: Insufficient documentation

## 2017-04-25 DIAGNOSIS — Z79899 Other long term (current) drug therapy: Secondary | ICD-10-CM | POA: Insufficient documentation

## 2017-04-25 DIAGNOSIS — E876 Hypokalemia: Secondary | ICD-10-CM | POA: Insufficient documentation

## 2017-04-25 DIAGNOSIS — K5289 Other specified noninfective gastroenteritis and colitis: Secondary | ICD-10-CM | POA: Insufficient documentation

## 2017-04-25 LAB — LIPASE, BLOOD: Lipase: 36 U/L (ref 11–51)

## 2017-04-25 LAB — URINALYSIS, ROUTINE W REFLEX MICROSCOPIC
Bilirubin Urine: NEGATIVE
Glucose, UA: NEGATIVE mg/dL
HGB URINE DIPSTICK: NEGATIVE
KETONES UR: NEGATIVE mg/dL
Leukocytes, UA: NEGATIVE
Nitrite: NEGATIVE
PROTEIN: NEGATIVE mg/dL
Specific Gravity, Urine: 1.028 (ref 1.005–1.030)
pH: 5 (ref 5.0–8.0)

## 2017-04-25 LAB — COMPREHENSIVE METABOLIC PANEL
ALK PHOS: 61 U/L (ref 38–126)
ALT: 12 U/L — AB (ref 14–54)
AST: 18 U/L (ref 15–41)
Albumin: 3.7 g/dL (ref 3.5–5.0)
Anion gap: 9 (ref 5–15)
BUN: 8 mg/dL (ref 6–20)
CALCIUM: 9.7 mg/dL (ref 8.9–10.3)
CO2: 20 mmol/L — ABNORMAL LOW (ref 22–32)
CREATININE: 0.76 mg/dL (ref 0.44–1.00)
Chloride: 110 mmol/L (ref 101–111)
Glucose, Bld: 98 mg/dL (ref 65–99)
Potassium: 3.3 mmol/L — ABNORMAL LOW (ref 3.5–5.1)
Sodium: 139 mmol/L (ref 135–145)
Total Bilirubin: 0.7 mg/dL (ref 0.3–1.2)
Total Protein: 7.2 g/dL (ref 6.5–8.1)

## 2017-04-25 LAB — CBC
HCT: 44.2 % (ref 36.0–46.0)
Hemoglobin: 15 g/dL (ref 12.0–15.0)
MCH: 29.2 pg (ref 26.0–34.0)
MCHC: 33.9 g/dL (ref 30.0–36.0)
MCV: 86.2 fL (ref 78.0–100.0)
PLATELETS: 344 10*3/uL (ref 150–400)
RBC: 5.13 MIL/uL — AB (ref 3.87–5.11)
RDW: 13.4 % (ref 11.5–15.5)
WBC: 10.8 10*3/uL — AB (ref 4.0–10.5)

## 2017-04-25 LAB — PREGNANCY, URINE: PREG TEST UR: NEGATIVE

## 2017-04-25 MED ORDER — POTASSIUM CHLORIDE ER 10 MEQ PO TBCR: 10.0000 meq | EXTENDED_RELEASE_TABLET | Freq: Every day | ORAL | 0 refills | Status: DC

## 2017-04-25 MED ORDER — SODIUM CHLORIDE 0.9 % IV BOLUS (SEPSIS)
1000.0000 mL | Freq: Once | INTRAVENOUS | Status: AC
  Administered 2017-04-25: 1000 mL via INTRAVENOUS

## 2017-04-25 MED ORDER — PROMETHAZINE HCL 25 MG RE SUPP: 25.0000 mg | Freq: Four times a day (QID) | RECTAL | 0 refills | Status: DC | PRN

## 2017-04-25 MED ORDER — ACETAMINOPHEN 500 MG PO TABS
1000.0000 mg | ORAL_TABLET | Freq: Once | ORAL | Status: AC
  Administered 2017-04-25: 1000 mg via ORAL
  Filled 2017-04-25: qty 2

## 2017-04-25 MED ORDER — PROMETHAZINE HCL 25 MG PO TABS: 25.0000 mg | ORAL_TABLET | Freq: Four times a day (QID) | ORAL | 0 refills | Status: DC | PRN

## 2017-04-25 MED ORDER — PRAMOXINE HCL 1 % RE FOAM: 1.0000 "application " | Freq: Three times a day (TID) | RECTAL | 0 refills | Status: DC | PRN

## 2017-04-25 MED ORDER — PROMETHAZINE HCL 25 MG/ML IJ SOLN
25.0000 mg | Freq: Once | INTRAMUSCULAR | Status: AC
  Administered 2017-04-25: 25 mg via INTRAMUSCULAR
  Filled 2017-04-25: qty 1

## 2017-04-25 NOTE — ED Provider Notes (Signed)
[facility] Provider Note   CSN: [id number] Arrival date & time: 04/25/17  5621    History   Chief Complaint Chief Complaint  Patient presents with  . Emesis  . Diarrhea    HPI Kerry Young is a 51 y.o. female.  HPI  51 y/o female with hx of 3 days of vomiting and diarrhea - 10 episodes of diarrhea last night - she has had vomiting that has now become dry heaving - symptoms are persistent over 3 days - no blood in stools but b/c of all the stools she has had recurrent hemorrhoids.  The patient complains of diffuse abdominal tightness and pain which is chronic for the last 3 days. 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 She denies any travel, new medications or recent antibiotics. She has had nothing for the symptoms over the last 3 days. She now feels dehydrated and lightheaded  Past Medical History:  Diagnosis Date  . Anxiety   . Migraines   . Sciatica   . UTI (urinary tract infection)     There are no active problems to display for this patient.   History reviewed. No pertinent surgical history.  OB History    No data available       Home Medications    Prior to Admission medications   Medication Sig Start Date End Date Taking? Authorizing Provider  ALPRAZolam Prudy Feeler) 1 MG tablet Take 1 mg by mouth at bedtime as needed for anxiety.    Yes [provider]  phenylephrine-shark liver oil-mineral oil-petrolatum (PREPARATION H) 0.25-3-14-71.9 % rectal ointment Place 1 application rectally 2 (two) times daily as needed for hemorrhoids.   Yes [provider]  potassium chloride (K-DUR) 10 MEQ tablet Take 1 tablet (10 mEq total) by mouth daily. 04/25/17   Eber Hong, MD  pramoxine (PROCTOFOAM) 1 % foam Place 1 application rectally 3 (three) times daily as needed for anal itching. 04/25/17   Eber Hong, MD  promethazine (PHENERGAN) 25 MG suppository Place 1  suppository (25 mg total) rectally every 6 (six) hours as needed for nausea or vomiting. 04/25/17   Eber Hong, MD  promethazine (PHENERGAN) 25 MG tablet Take 1 tablet (25 mg total) by mouth every 6 (six) hours as needed for nausea or vomiting. 04/25/17   Eber Hong, MD    Family History History reviewed. No pertinent family history.  Social History Social History  Substance Use Topics  . Smoking status: Current Every Day Smoker    Packs/day: 0.50    Types: Cigarettes  . Smokeless tobacco: Never Used  . Alcohol use No     Comment: occ     Allergies   Other; Percocet [oxycodone-acetaminophen]; and Zofran [ondansetron hcl]   Review of Systems Review of Systems  All other systems reviewed and are negative.    Physical Exam Updated Vital Signs BP 106/86   Pulse 68   Temp 98.3 F (36.8 C) (Oral)   Resp 18   SpO2 99%   Physical Exam  Constitutional: She appears well-developed and well-nourished. No distress.  HENT:  Head: Normocephalic and atraumatic.  Mouth/Throat: Oropharynx is clear and moist. No oropharyngeal exudate.  Eyes: Pupils are equal, round, and reactive to light. Conjunctivae and EOM are normal. Right eye exhibits no discharge. Left eye exhibits no discharge. No scleral icterus.  Neck: Normal range of motion. Neck supple. No JVD present. No thyromegaly  present.  Cardiovascular: Normal rate, regular rhythm, normal heart sounds and intact distal pulses.  Exam reveals no gallop and no friction rub.   No murmur heard. Pulmonary/Chest: Effort normal and breath sounds normal. No respiratory distress. She has no wheezes. She has no rales.  Abdominal: Soft. Bowel sounds are normal. She exhibits no distension and no mass. There is tenderness ( Diffuse mild abdominal tenderness with no guarding. No rigidity, no peritoneal signs).  Musculoskeletal: Normal range of motion. She exhibits no edema or tenderness.  Lymphadenopathy:    She has no cervical adenopathy.    Neurological: She is alert. Coordination normal.  Skin: Skin is warm and dry. No rash noted. No erythema.  Psychiatric: She has a normal mood and affect. Her behavior is normal.  Nursing note and vitals reviewed.    ED Treatments / Results  Labs (all labs ordered are listed, but only abnormal results are displayed) Labs Reviewed  COMPREHENSIVE METABOLIC PANEL - Abnormal; Notable for the following:       Result Value   Potassium 3.3 (*)    CO2 20 (*)    ALT 12 (*)    All other components within normal limits  CBC - Abnormal; Notable for the following:    WBC 10.8 (*)    RBC 5.13 (*)    All other components within normal limits  URINALYSIS, ROUTINE W REFLEX MICROSCOPIC - Abnormal; Notable for the following:    APPearance CLOUDY (*)    All other components within normal limits  LIPASE, BLOOD  PREGNANCY, URINE     Radiology No results found.  Procedures Procedures (including critical care time)  Medications Ordered in ED Medications  promethazine (PHENERGAN) injection 25 mg (25 mg Intramuscular Given 04/25/17 0838)  sodium chloride 0.9 % bolus 1,000 mL (0 mLs Intravenous Stopped 04/25/17 1001)  acetaminophen (TYLENOL) tablet 1,000 mg (1,000 mg Oral Given 04/25/17 0926)     Initial Impression / Assessment and Plan / ED Course  I have reviewed the triage vital signs and the nursing notes.  Pertinent labs & imaging results that were available during my care of the patient were reviewed by me and considered in my medical decision making (see chart for details).     Has abd pain without focal findings Predominantly diarrhea with resolving vomiting Check lytes, rehydrate, IM Phenergan Pt asking for "generic Hydrocodone"  Final Clinical Impressions(s) / ED Diagnoses   Final diagnoses:  Gastroenteritis  Hypokalemia    New Prescriptions Discharge Medication List as of 04/25/2017  9:59 AM    START taking these medications   Details  potassium chloride (K-DUR) 10 MEQ  tablet Take 1 tablet (10 mEq total) by mouth daily., Starting Sun 04/25/2017, Print    pramoxine (PROCTOFOAM) 1 % foam Place 1 application rectally 3 (three) times daily as needed for anal itching., Starting Sun 04/25/2017, Print    promethazine (PHENERGAN) 25 MG suppository Place 1 suppository (25 mg total) rectally every 6 (six) hours as needed for nausea or vomiting., Starting Sun 04/25/2017, Print         Eber Hong, MD 04/25/17 1204

## 2017-04-25 NOTE — ED Notes (Signed)
Pt voices understanding of discharge instructions. In NAD at departure. Ambulatory with steady gait.

## 2017-04-25 NOTE — ED Notes (Signed)
Pt given water per Dr. Hyacinth Meeker, pt had no issues with swallowing. Notified Dr. Hyacinth Meeker

## 2017-04-25 NOTE — ED Triage Notes (Signed)
Pt is having n/v/d since Friday. Reports severe pain to abd and rectum due to hemorrhoids.

## 2017-04-25 NOTE — Discharge Instructions (Signed)
Your potassium was slightly low because of the vomiting and diarrhea Your other blood work was reassuring  Take one tablet of potassium daily for 5 days Phenergan either by mouth or by rectum every 6 hours as needed for nausea Proctofoam for your hemorrhoids.   BRAT diet for 3 days Imodium only as needed Tylenol or naprosyn for pain ER for worsening symtpoms.

## 2017-04-26 ENCOUNTER — Encounter (HOSPITAL_COMMUNITY): Payer: Self-pay | Admitting: Emergency Medicine

## 2017-04-26 ENCOUNTER — Emergency Department (HOSPITAL_COMMUNITY)
Admission: EM | Admit: 2017-04-26 | Discharge: 2017-04-26 | Disposition: A | Discharge status: Home / Self Care | Payer: Self-pay | Attending: Emergency Medicine | Admitting: Emergency Medicine

## 2017-04-26 DIAGNOSIS — F17210 Nicotine dependence, cigarettes, uncomplicated: Secondary | ICD-10-CM | POA: Insufficient documentation

## 2017-04-26 DIAGNOSIS — K6289 Other specified diseases of anus and rectum: Secondary | ICD-10-CM | POA: Insufficient documentation

## 2017-04-26 DIAGNOSIS — K649 Unspecified hemorrhoids: Secondary | ICD-10-CM | POA: Insufficient documentation

## 2017-04-26 DIAGNOSIS — Z79899 Other long term (current) drug therapy: Secondary | ICD-10-CM | POA: Insufficient documentation

## 2017-04-26 MED ORDER — NAPROXEN 500 MG PO TABS: 500.0000 mg | ORAL_TABLET | Freq: Two times a day (BID) | ORAL | 0 refills | Status: DC | PRN

## 2017-04-26 NOTE — ED Provider Notes (Signed)
[facility]-EMERGENCY DEPT Provider Note   CSN: [id number] Arrival date & time: 04/26/17  0143     History   Chief Complaint Chief Complaint  Patient presents with  . Hemorrhoids    HPI Kerry Young is a 51 y.o. female.  51 year old female with a history of migraines, sciatica, UTI presents to the emergency department after being seen at [facility] yesterday afternoon. She is complaining of hemorrhoids which are causing her rectal pain. She states that she was prescribed a foam for this, but was unable to afford it because it was expensive. She was previously seen for vomiting and diarrhea reporting multiple bowel movements and episodes of emesis. She states that both of these have subsided since her previous evaluation. She denies any difficulty having a bowel movement. No melena or hematochezia. She is unable to ambulate or sit without discomfort. She reports a history of hemorrhoids in the past, mostly after childbirth. She is requesting a tablet of Vicodin for pain control.   The history is provided by the patient. No language interpreter was used.    Past Medical History:  Diagnosis Date  . Anxiety   . Migraines   . Sciatica   . UTI (urinary tract infection)     There are no active problems to display for this patient.   History reviewed. No pertinent surgical history.  OB History    No data available       Home Medications    Prior to Admission medications   Medication Sig Start Date End Date Taking? Authorizing Provider  ALPRAZolam Prudy Feeler) 1 MG tablet Take 1 mg by mouth at bedtime as needed for anxiety.     [provider]  naproxen (NAPROSYN) 500 MG tablet Take 1 tablet (500 mg total) by mouth every 12 (twelve) hours as needed for mild pain or moderate pain. 04/26/17   Antony Madura, PA-C  phenylephrine-shark liver oil-mineral oil-petrolatum (PREPARATION H) 0.25-3-14-71.9 % rectal ointment Place 1 application rectally 2 (two) times daily as needed for  hemorrhoids.    [provider]  potassium chloride (K-DUR) 10 MEQ tablet Take 1 tablet (10 mEq total) by mouth daily. 04/25/17   Eber Hong, MD  pramoxine (PROCTOFOAM) 1 % foam Place 1 application rectally 3 (three) times daily as needed for anal itching. 04/25/17   Eber Hong, MD  promethazine (PHENERGAN) 25 MG suppository Place 1 suppository (25 mg total) rectally every 6 (six) hours as needed for nausea or vomiting. 04/25/17   Eber Hong, MD  promethazine (PHENERGAN) 25 MG tablet Take 1 tablet (25 mg total) by mouth every 6 (six) hours as needed for nausea or vomiting. 04/25/17   Eber Hong, MD    Family History No family history on file.  Social History Social History  Substance Use Topics  . Smoking status: Current Every Day Smoker    Packs/day: 0.50    Types: Cigarettes  . Smokeless tobacco: Never Used  . Alcohol use No     Comment: occ     Allergies   Other; Percocet [oxycodone-acetaminophen]; and Zofran [ondansetron hcl]   Review of Systems Review of Systems Ten systems reviewed and are negative for acute change, except as noted in the HPI.    Physical Exam Updated Vital Signs BP 105/74 (BP Location: Right Arm)   Pulse 82   Temp (!) 97.4 F (36.3 C) (Oral)   Resp 18   SpO2 99%   Physical Exam  Constitutional: She is oriented to person, place, and time.  She appears well-developed and well-nourished. No distress.  Nontoxic and in NAD  HENT:  Head: Normocephalic and atraumatic.  Eyes: Conjunctivae and EOM are normal. No scleral icterus.  Neck: Normal range of motion.  Pulmonary/Chest: Effort normal. No respiratory distress.  Respirations even and unlabored  Genitourinary:  Genitourinary Comments: Small external, non-thrombosed hemorrhoid. No anal fissure.  Musculoskeletal: Normal range of motion.  Neurological: She is alert and oriented to person, place, and time. She exhibits normal muscle tone. Coordination normal.  Skin: Skin is warm and  dry. No rash noted. She is not diaphoretic. No erythema. No pallor.  Psychiatric: She has a normal mood and affect. Her behavior is normal.  Nursing note and vitals reviewed.    ED Treatments / Results  Labs (all labs ordered are listed, but only abnormal results are displayed) Results for orders placed or performed during the hospital encounter of 04/25/17  Lipase, blood  Result Value Ref Range   Lipase 36 11 - 51 U/L  Comprehensive metabolic panel  Result Value Ref Range   Sodium 139 135 - 145 mmol/L   Potassium 3.3 (L) 3.5 - 5.1 mmol/L   Chloride 110 101 - 111 mmol/L   CO2 20 (L) 22 - 32 mmol/L   Glucose, Bld 98 65 - 99 mg/dL   BUN 8 6 - 20 mg/dL   Creatinine, Ser 1.61 0.44 - 1.00 mg/dL   Calcium 9.7 8.9 - 09.6 mg/dL   Total Protein 7.2 6.5 - 8.1 g/dL   Albumin 3.7 3.5 - 5.0 g/dL   AST 18 15 - 41 U/L   ALT 12 (L) 14 - 54 U/L   Alkaline Phosphatase 61 38 - 126 U/L   Total Bilirubin 0.7 0.3 - 1.2 mg/dL   GFR calc non Af Amer >60 >60 mL/min   GFR calc Af Amer >60 >60 mL/min   Anion gap 9 5 - 15  CBC  Result Value Ref Range   WBC 10.8 (H) 4.0 - 10.5 K/uL   RBC 5.13 (H) 3.87 - 5.11 MIL/uL   Hemoglobin 15.0 12.0 - 15.0 g/dL   HCT 04.5 40.9 - 81.1 %   MCV 86.2 78.0 - 100.0 fL   MCH 29.2 26.0 - 34.0 pg   MCHC 33.9 30.0 - 36.0 g/dL   RDW 91.4 78.2 - 95.6 %   Platelets 344 150 - 400 K/uL  Urinalysis, Routine w reflex microscopic  Result Value Ref Range   Color, Urine YELLOW YELLOW   APPearance CLOUDY (A) CLEAR   Specific Gravity, Urine 1.028 1.005 - 1.030   pH 5.0 5.0 - 8.0   Glucose, UA NEGATIVE NEGATIVE mg/dL   Hgb urine dipstick NEGATIVE NEGATIVE   Bilirubin Urine NEGATIVE NEGATIVE   Ketones, ur NEGATIVE NEGATIVE mg/dL   Protein, ur NEGATIVE NEGATIVE mg/dL   Nitrite NEGATIVE NEGATIVE   Leukocytes, UA NEGATIVE NEGATIVE  Pregnancy, urine  Result Value Ref Range   Preg Test, Ur NEGATIVE NEGATIVE    EKG  EKG Interpretation None       Radiology No results  found.  Procedures Procedures (including critical care time)  Medications Ordered in ED Medications - No data to display   Initial Impression / Assessment and Plan / ED Course  I have reviewed the triage vital signs and the nursing notes.  Pertinent labs & imaging results that were available during my care of the patient were reviewed by me and considered in my medical decision making (see chart for details).  51 year old female presenting for rectal pain which she attributes to hemorrhoids. She does have evidence of the small external hemorrhoid which is nonthrombosed. Patient was seen yesterday afternoon of [facility] for vomiting and diarrhea. She was given a prescription for Proctofoam which she was unable to fill due to cost. She is requesting tablets of Vicodin for pain management. I have explained to the patient that we are unable to provide this for her today. She has been encouraged to follow up with her primary care doctor should she require additional pain control. Will prescribe naproxen and have recommended sitz baths. Return precautions discussed and provided. Patient discharged in stable condition with no unaddressed concerns.   Vitals:   04/26/17 0216  BP: 105/74  Pulse: 82  Resp: 18  Temp: (!) 97.4 F (36.3 C)  TempSrc: Oral  SpO2: 99%    Final Clinical Impressions(s) / ED Diagnoses   Final diagnoses:  Rectal pain    New Prescriptions New Prescriptions   NAPROXEN (NAPROSYN) 500 MG TABLET    Take 1 tablet (500 mg total) by mouth every 12 (twelve) hours as needed for mild pain or moderate pain.     Antony Madura, PA-C 04/26/17 0415    Dione Booze, MD 04/26/17 [phone]

## 2017-04-26 NOTE — ED Triage Notes (Signed)
Pt reports she was seen at [facility] earlier today for her hemorrhoids (external) and was prescribed an ointment.  States even with her Good RX card the ointment would still be 70 dollars and she cannot afford it.  Denies bleeding at site but states the hemorrhoids are "huge".  Pt ambulatory but unable to sit normally due to pain.

## 2017-07-04 ENCOUNTER — Encounter (HOSPITAL_COMMUNITY): Payer: Self-pay | Admitting: Emergency Medicine

## 2017-07-04 ENCOUNTER — Emergency Department (HOSPITAL_COMMUNITY): Payer: Self-pay

## 2017-07-04 ENCOUNTER — Emergency Department (HOSPITAL_COMMUNITY)
Admission: EM | Admit: 2017-07-04 | Discharge: 2017-07-04 | Disposition: A | Discharge status: Home / Self Care | Payer: Self-pay | Attending: Emergency Medicine | Admitting: Emergency Medicine

## 2017-07-04 DIAGNOSIS — S29012A Strain of muscle and tendon of back wall of thorax, initial encounter: Secondary | ICD-10-CM | POA: Insufficient documentation

## 2017-07-04 DIAGNOSIS — Y929 Unspecified place or not applicable: Secondary | ICD-10-CM | POA: Insufficient documentation

## 2017-07-04 DIAGNOSIS — Y939 Activity, unspecified: Secondary | ICD-10-CM | POA: Insufficient documentation

## 2017-07-04 DIAGNOSIS — F17210 Nicotine dependence, cigarettes, uncomplicated: Secondary | ICD-10-CM | POA: Insufficient documentation

## 2017-07-04 DIAGNOSIS — Z79899 Other long term (current) drug therapy: Secondary | ICD-10-CM | POA: Insufficient documentation

## 2017-07-04 DIAGNOSIS — T148XXA Other injury of unspecified body region, initial encounter: Secondary | ICD-10-CM

## 2017-07-04 DIAGNOSIS — X58XXXA Exposure to other specified factors, initial encounter: Secondary | ICD-10-CM | POA: Insufficient documentation

## 2017-07-04 DIAGNOSIS — Y999 Unspecified external cause status: Secondary | ICD-10-CM | POA: Insufficient documentation

## 2017-07-04 IMAGING — CR DG SHOULDER 2+V*R*
3 series · 3 of 3 positions shown · non-contrast
Comparison: None

CLINICAL DATA: Right shoulder pain.

EXAM:
RIGHT SHOULDER - 2+ VIEW

[w shoulder external right]
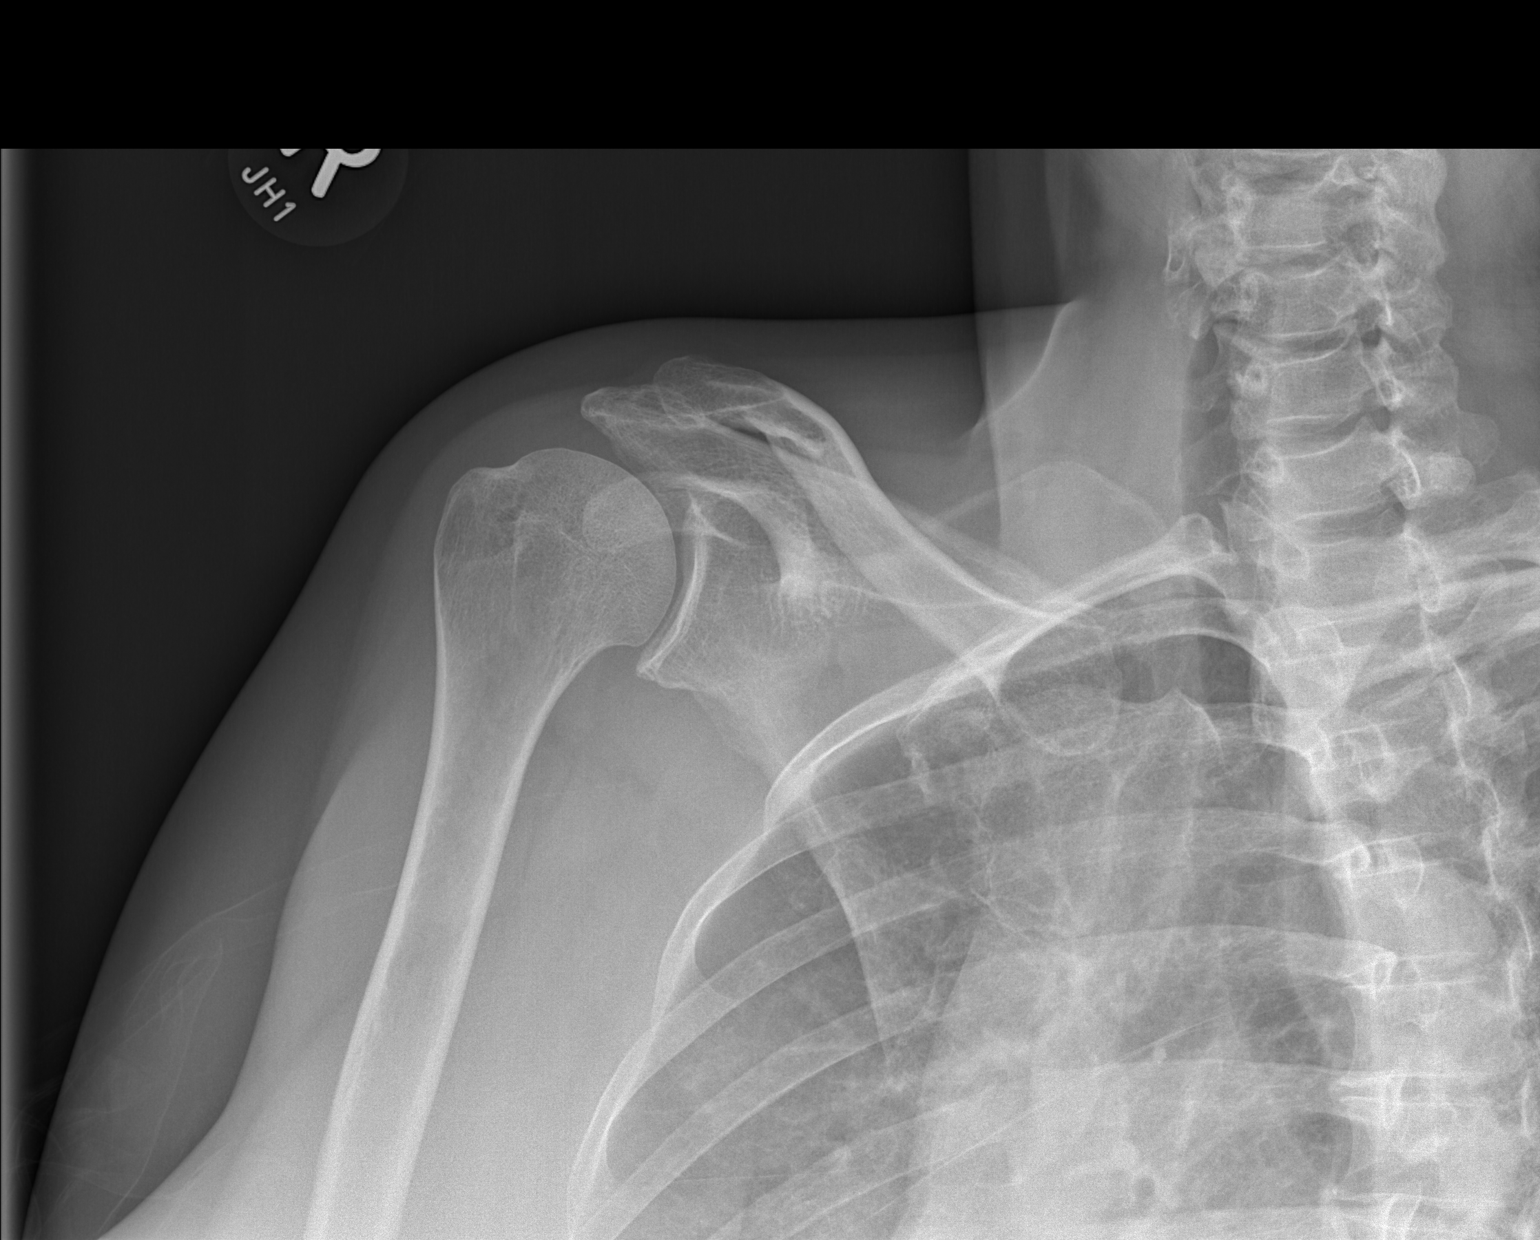

[w shoulder y-view right]
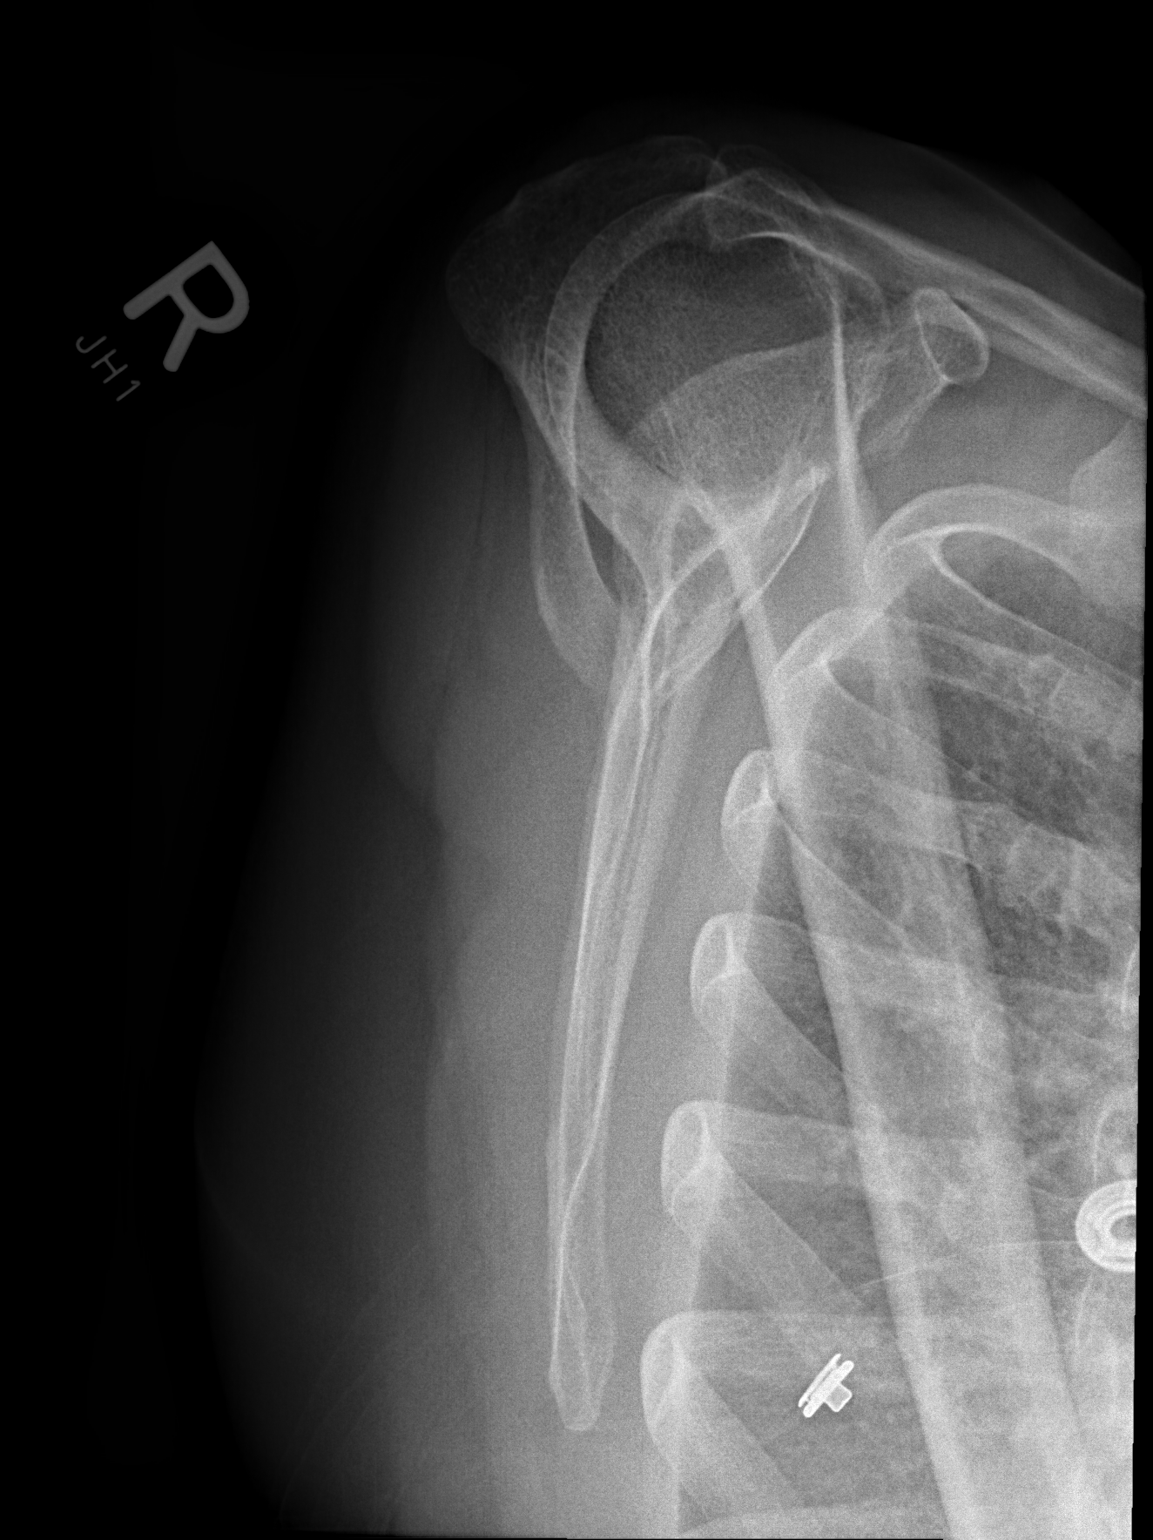

[x shoulder axillary right]
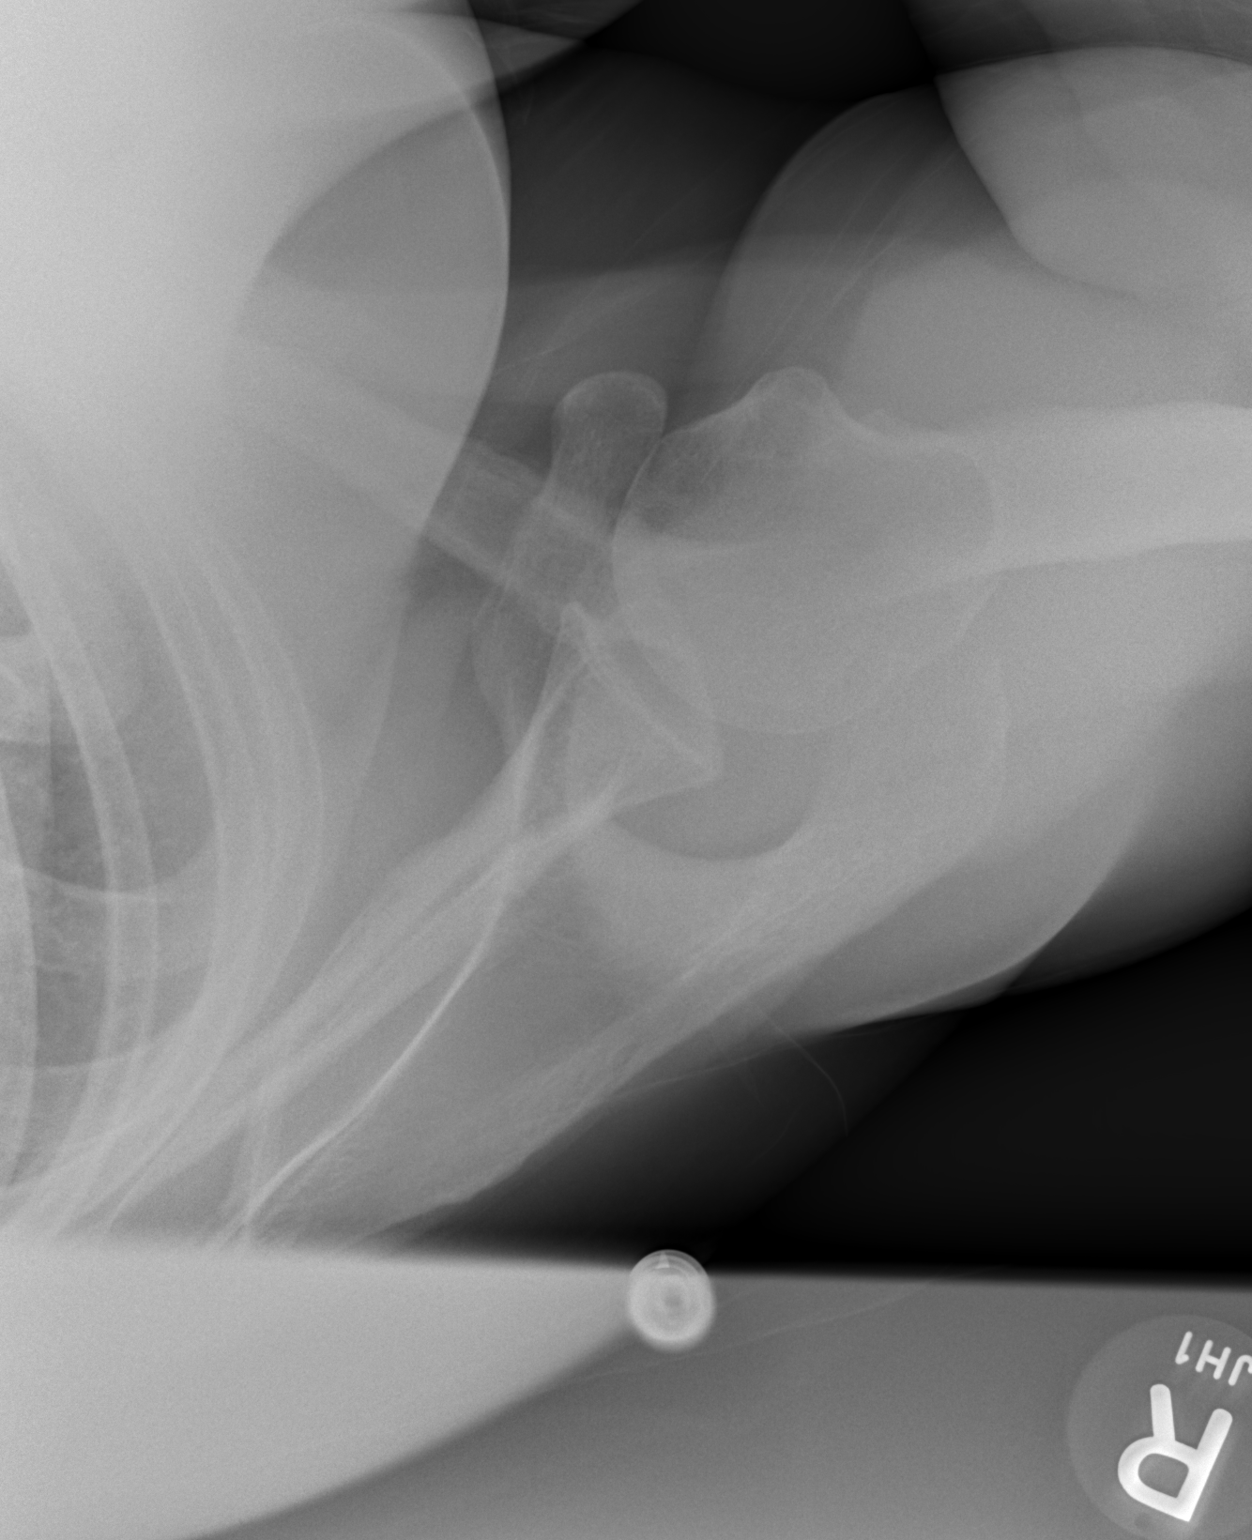

[3 of 3 positions shown; findings below may reference images not displayed]

FINDINGS: There is no evidence of fracture or dislocation. There is no
evidence of arthropathy or other focal bone abnormality. Soft
tissues are unremarkable.
IMPRESSION: Negative.

## 2017-07-04 MED ORDER — METHOCARBAMOL 500 MG PO TABS: 500.0000 mg | ORAL_TABLET | Freq: Two times a day (BID) | ORAL | 0 refills | Status: DC | PRN

## 2017-07-04 MED ORDER — METHOCARBAMOL 500 MG PO TABS
500.0000 mg | ORAL_TABLET | Freq: Once | ORAL | Status: AC
  Administered 2017-07-04: 500 mg via ORAL
  Filled 2017-07-04: qty 1

## 2017-07-04 MED ORDER — HYDROCODONE-ACETAMINOPHEN 5-325 MG PO TABS: 1.0000 | ORAL_TABLET | Freq: Four times a day (QID) | ORAL | 0 refills | Status: DC | PRN

## 2017-07-04 MED ORDER — HYDROCODONE-ACETAMINOPHEN 5-325 MG PO TABS
1.0000 | ORAL_TABLET | Freq: Once | ORAL | Status: AC
  Administered 2017-07-04: 1 via ORAL
  Filled 2017-07-04: qty 1

## 2017-07-04 NOTE — ED Provider Notes (Signed)
[facility]-EMERGENCY DEPT Provider Note   CSN: [id number] Arrival date & time: 07/04/17  0919     History   Chief Complaint Chief Complaint  Patient presents with  . Back Pain    HPI Kerry Young a 51 y.o. female.  The history Young provided by the patient and medical records. No language interpreter was used.  Back Pain       Kerry Young Young a left hand dominant 51 y.o. female  with a PMH of anxiety, migraines who presents to the Emergency Department complaining of right upper back pain which began yesterday. Patient states that she stretched yesterday morning and suddenly felt a sharp, non-radiating pain to the upper back. She denies any known injury, but states that she did brush the snow off of her car for quite some time on Thursday and this may have been contributory.  He has tried Tylenol, Advil, Aspercreme, heat and ice all with little improvement.  Pain Young worse with certain movements, especially brushing her hair or lifting her hand above her head.  No history of similar pain.  No chest pain, shortness of breath, urinary symptoms, numbness, tingling or weakness.   Past Medical History:  Diagnosis Date  . Anxiety   . Migraines   . Sciatica   . UTI (urinary tract infection)     There are no active problems to display for this patient.   History reviewed. No pertinent surgical history.  OB History    No data available       Home Medications    Prior to Admission medications   Medication Sig Start Date End Date Taking? Authorizing Provider  ALPRAZolam Prudy Feeler(XANAX) 1 MG tablet Take 1 mg by mouth at bedtime as needed for anxiety.     [provider]  HYDROcodone-acetaminophen (NORCO/VICODIN) 5-325 MG tablet Take 1 tablet by mouth every 6 (six) hours as needed for moderate pain or severe pain. 07/04/17   Ward, Chase PicketJaime Pilcher, PA-C  methocarbamol (ROBAXIN) 500 MG tablet Take 1 tablet (500 mg total) by mouth 2 (two) times daily as needed  for muscle spasms. 07/04/17   Ward, Chase PicketJaime Pilcher, PA-C  naproxen (NAPROSYN) 500 MG tablet Take 1 tablet (500 mg total) by mouth every 12 (twelve) hours as needed for mild pain or moderate pain. 04/26/17   Antony MaduraHumes, Kelly, PA-C  phenylephrine-shark liver oil-mineral oil-petrolatum (PREPARATION H) 0.25-3-14-71.9 % rectal ointment Place 1 application rectally 2 (two) times daily as needed for hemorrhoids.    [provider]  potassium chloride (K-DUR) 10 MEQ tablet Take 1 tablet (10 mEq total) by mouth daily. 04/25/17   Eber HongMiller, Brian, MD  pramoxine (PROCTOFOAM) 1 % foam Place 1 application rectally 3 (three) times daily as needed for anal itching. 04/25/17   Eber HongMiller, Brian, MD  promethazine (PHENERGAN) 25 MG suppository Place 1 suppository (25 mg total) rectally every 6 (six) hours as needed for nausea or vomiting. 04/25/17   Eber HongMiller, Brian, MD  promethazine (PHENERGAN) 25 MG tablet Take 1 tablet (25 mg total) by mouth every 6 (six) hours as needed for nausea or vomiting. 04/25/17   Eber HongMiller, Brian, MD    Family History No family history on file.  Social History Social History   Tobacco Use  . Smoking status: Current Every Day Smoker    Packs/day: 0.50    Types: Cigarettes  . Smokeless tobacco: Never Used  Substance Use Topics  . Alcohol use: No    Comment: occ  . Drug  use: No     Allergies   Other; Percocet [oxycodone-acetaminophen]; and Zofran [ondansetron hcl]   Review of Systems Review of Systems  Musculoskeletal: Positive for back pain. Negative for neck pain.  All other systems reviewed and are negative.    Physical Exam Updated Vital Signs BP 116/77 (BP Location: Left Arm)   Pulse 75   Temp 98.3 F (36.8 C) (Oral)   Resp 18   Ht 5\' 2"  (1.575 m)   Wt 78.5 kg (173 lb)   SpO2 94%   BMI 31.64 kg/m   Physical Exam  Constitutional: [name] oriented to person, place, and time. She appears well-developed and well-nourished. No distress.  HENT:  Head: Normocephalic and  atraumatic.  Cardiovascular: Normal rate, regular rhythm and normal heart sounds.  No murmur heard. Pulmonary/Chest: Effort normal and breath sounds normal. No respiratory distress.  Abdominal: Soft. She exhibits no distension. There Young no tenderness.  Musculoskeletal:       Back:  No midline C/T/L spine tenderness. Tenderness to palpation as depicted in image. + Neer's. Full ROM and 5/5 strength in bilateral UE's. Sensation intact.   Neurological: [name] alert and oriented to person, place, and time.  Skin: Skin Young warm and dry.  Nursing note and vitals reviewed.    ED Treatments / Results  Labs (all labs ordered are listed, but only abnormal results are displayed) Labs Reviewed - No data to display  EKG  EKG Interpretation None       Radiology Dg Shoulder Right  Result Date: 07/04/2017 CLINICAL DATA:  Right shoulder pain. EXAM: RIGHT SHOULDER - 2+ VIEW COMPARISON:  None FINDINGS: There Young no evidence of fracture or dislocation. There Young no evidence of arthropathy or other focal bone abnormality. Soft tissues are unremarkable. IMPRESSION: Negative. Electronically Signed   By: Signa Kell M.D.   On: 07/04/2017 11:35    Procedures Procedures (including critical care time)  Medications Ordered in ED Medications  HYDROcodone-acetaminophen (NORCO/VICODIN) 5-325 MG per tablet 1 tablet (1 tablet Oral Given 07/04/17 1043)  methocarbamol (ROBAXIN) tablet 500 mg (500 mg Oral Given 07/04/17 1043)     Initial Impression / Assessment and Plan / ED Course  I have reviewed the triage vital signs and the nursing notes.  Pertinent labs & imaging results that were available during my care of the patient were reviewed by me and considered in my medical decision making (see chart for details).    Kerry Young a 51 y.o. female who presents to ED for right-sided upper back pain and posterior shoulder pain. No extremity weakness, numbness, tingling. No fever or infectious symptoms.  All 4 extremities NVI. No midline tenderness. Tender on exam and pain reproducible with certain movements, especially lifting arm above head. C/w musculoskeletal etiology.  No chest pain or shortness of breath. X-ray negative.  Feels improved with symptomatic medications in ED.  I have reviewed return precautions and symptomatic home care instructions.  Patient has a primary care provider and agrees to call tomorrow to arrange follow-up appointment for recheck. All questions answered.    Final Clinical Impressions(s) / ED Diagnoses   Final diagnoses:  Muscle strain    ED Discharge Orders        Ordered    methocarbamol (ROBAXIN) 500 MG tablet  2 times daily PRN     07/04/17 1216    HYDROcodone-acetaminophen (NORCO/VICODIN) 5-325 MG tablet  Every 6 hours PRN     07/04/17 1216  Ward, Chase PicketJaime Pilcher, PA-C 07/04/17 1226    Vanetta MuldersZackowski, Scott, MD 07/05/17 0830

## 2017-07-04 NOTE — Discharge Instructions (Signed)
It was my pleasure taking care of you today!   Ibuprofen as needed for mild to moderate pain. Norco only as needed for severe pain - This can make you very drowsy - please do not drink alcohol, operate heavy machinery or drive on this medication.   Please call your primary care doctor tomorrow to schedule a follow up appointment for recheck.   Return to ER for new or worsening symptoms, any additional concerns.

## 2017-07-04 NOTE — ED Triage Notes (Signed)
Patient reports right upper back pain that not relieved with OTC medications such as icy hot, pain patches.  Patient denies any injury or falls. But reports she did scrape snow with that arm earlier in the week.

## 2017-07-04 NOTE — ED Notes (Signed)
ED Provider at bedside. 

## 2017-07-04 NOTE — ED Notes (Signed)
Patient transported to X-ray 

## 2017-09-08 ENCOUNTER — Emergency Department (HOSPITAL_COMMUNITY)
Admission: EM | Admit: 2017-09-08 | Discharge: 2017-09-08 | Disposition: A | Discharge status: Home / Self Care | Payer: Self-pay | Attending: Emergency Medicine | Admitting: Emergency Medicine

## 2017-09-08 ENCOUNTER — Encounter (HOSPITAL_COMMUNITY): Payer: Self-pay | Admitting: Emergency Medicine

## 2017-09-08 DIAGNOSIS — K029 Dental caries, unspecified: Secondary | ICD-10-CM | POA: Insufficient documentation

## 2017-09-08 DIAGNOSIS — F17210 Nicotine dependence, cigarettes, uncomplicated: Secondary | ICD-10-CM | POA: Insufficient documentation

## 2017-09-08 MED ORDER — PENICILLIN V POTASSIUM 500 MG PO TABS: 500.0000 mg | ORAL_TABLET | Freq: Four times a day (QID) | ORAL | 0 refills | Status: AC

## 2017-09-08 MED ORDER — KETOROLAC TROMETHAMINE 30 MG/ML IJ SOLN
30.0000 mg | Freq: Once | INTRAMUSCULAR | Status: AC
Start: 2017-09-08 — End: 2017-09-08
  Administered 2017-09-08: 30 mg via INTRAMUSCULAR
  Filled 2017-09-08: qty 1

## 2017-09-08 MED ORDER — NAPROXEN 500 MG PO TABS: 500.0000 mg | ORAL_TABLET | Freq: Two times a day (BID) | ORAL | 0 refills | Status: DC | PRN

## 2017-09-08 NOTE — ED Triage Notes (Signed)
Patient here from home with complaints of left lower dental pain x2 days. OTC medications with no relief.

## 2017-09-08 NOTE — ED Provider Notes (Signed)
[facility]-EMERGENCY DEPT Provider Note   CSN: [id number] Arrival date & time: 09/08/17  7425     History   Chief Complaint Chief Complaint  Patient presents with  . Dental Pain    HPI Kerry Young is a 52 y.o. female w PMHx anxiety, presenting to the ED complaining of dental pain located left lower back molar. Pt states pain began 2 days ago, described as throbbing and shooting pain. Has used orajel and applied aspirin topically without much relief of symptoms. Denies difficulty swallowing or breathing, fever, chills, N/V, or other symptoms today. Has a dentist but states she has not made an appointment.   The history is provided by the patient.    Past Medical History:  Diagnosis Date  . Anxiety   . Migraines   . Sciatica   . UTI (urinary tract infection)     There are no active problems to display for this patient.   History reviewed. No pertinent surgical history.  OB History    No data available       Home Medications    Prior to Admission medications   Medication Sig Start Date End Date Taking? Authorizing Provider  ALPRAZolam Prudy Feeler) 1 MG tablet Take 1 mg by mouth at bedtime as needed for anxiety.     [provider]  HYDROcodone-acetaminophen (NORCO/VICODIN) 5-325 MG tablet Take 1 tablet by mouth every 6 (six) hours as needed for moderate pain or severe pain. 07/04/17   Ward, Chase Picket, PA-C  methocarbamol (ROBAXIN) 500 MG tablet Take 1 tablet (500 mg total) by mouth 2 (two) times daily as needed for muscle spasms. 07/04/17   Ward, Chase Picket, PA-C  naproxen (NAPROSYN) 500 MG tablet Take 1 tablet (500 mg total) by mouth 2 (two) times daily as needed. 09/08/17   Camielle Sizer, [place] N, PA-C  penicillin v potassium (VEETID) 500 MG tablet Take 1 tablet (500 mg total) by mouth 4 (four) times daily for 7 days. 09/08/17 09/15/17  Bodin Gorka, [place] N, PA-C  phenylephrine-shark liver oil-mineral oil-petrolatum (PREPARATION H)  0.25-3-14-71.9 % rectal ointment Place 1 application rectally 2 (two) times daily as needed for hemorrhoids.    [provider]  potassium chloride (K-DUR) 10 MEQ tablet Take 1 tablet (10 mEq total) by mouth daily. 04/25/17   Eber Hong, MD  pramoxine (PROCTOFOAM) 1 % foam Place 1 application rectally 3 (three) times daily as needed for anal itching. 04/25/17   Eber Hong, MD  promethazine (PHENERGAN) 25 MG suppository Place 1 suppository (25 mg total) rectally every 6 (six) hours as needed for nausea or vomiting. 04/25/17   Eber Hong, MD  promethazine (PHENERGAN) 25 MG tablet Take 1 tablet (25 mg total) by mouth every 6 (six) hours as needed for nausea or vomiting. 04/25/17   Eber Hong, MD    Family History No family history on file.  Social History Social History   Tobacco Use  . Smoking status: Current Every Day Smoker    Packs/day: 0.50    Types: Cigarettes  . Smokeless tobacco: Never Used  Substance Use Topics  . Alcohol use: No    Comment: occ  . Drug use: No     Allergies   Other; Percocet [oxycodone-acetaminophen]; and Zofran [ondansetron hcl]   Review of Systems Review of Systems  Constitutional: Negative for fever.  HENT: Positive for dental problem. Negative for trouble swallowing and voice change.   Respiratory: Negative for stridor.      Physical Exam Updated Vital  Signs BP (!) 135/93 (BP Location: Right Arm)   Pulse 79   Temp 98.2 F (36.8 C) (Oral)   Resp 20   SpO2 100%   Physical Exam  Constitutional: She appears well-developed and well-nourished. No distress.  Tolerating secretions  HENT:  Head: Normocephalic and atraumatic.  Mouth/Throat: Uvula is midline and oropharynx is clear and moist. No trismus in the jaw. Dental caries present. No dental abscesses or uvula swelling.    Left lower wisdom tooth is decayed almost to gingiva. Surrounding gingival tenderness without significant erythema, no fluctuance. No sublingual edema.     Eyes: Conjunctivae are normal.  Neck: Normal range of motion. Neck supple.  Cardiovascular: Normal rate and intact distal pulses.  Pulmonary/Chest: Effort normal. No stridor.  Lymphadenopathy:    She has no cervical adenopathy.  Psychiatric: She has a normal mood and affect. Her behavior is normal.  Nursing note and vitals reviewed.    ED Treatments / Results  Labs (all labs ordered are listed, but only abnormal results are displayed) Labs Reviewed - No data to display  EKG  EKG Interpretation None       Radiology No results found.  Procedures Procedures (including critical care time)  Medications Ordered in ED Medications  ketorolac (TORADOL) 30 MG/ML injection 30 mg (30 mg Intramuscular Given 09/08/17 0849)     Initial Impression / Assessment and Plan / ED Course  I have reviewed the triage vital signs and the nursing notes.  Pertinent labs & imaging results that were available during my care of the patient were reviewed by me and considered in my medical decision making (see chart for details).     Patient with dental caries.  No gross abscess.  VSS, afebrile, tolerating secretions. Exam unconcerning for peritonsillar abscess, Ludwig's angina or spread of infection.  Will treat with penicillin and pain medicine.  Urged patient to follow-up with dentist. Pt safe for discharge.  Discussed results, findings, treatment and follow up. Patient advised of return precautions. Patient verbalized understanding and agreed with plan.  Final Clinical Impressions(s) / ED Diagnoses   Final diagnoses:  Pain due to dental caries    ED Discharge Orders        Ordered    penicillin v potassium (VEETID) 500 MG tablet  4 times daily     09/08/17 0848    naproxen (NAPROSYN) 500 MG tablet  2 times daily PRN     09/08/17 0848       Amit Meloy, [place] N, PA-C 09/08/17 0858    Miria Cappelli, [place] N, PA-C 09/08/17 [id number]    Mancel BaleWentz, Elliott, MD 09/09/17 [phone]

## 2017-09-08 NOTE — Discharge Instructions (Signed)
Please read instructions below. Take the antibiotic, Penicillin V, 4 times per day until they are gone. You can take Naproxen up to 2 times per day with meals, as needed for pain. Schedule an appointment with your dentist, or use the dental resource guide attached. Return to the ER for difficulty swallowing or breathing, fever, or new or worsening symptoms.

## 2017-09-15 ENCOUNTER — Encounter (HOSPITAL_COMMUNITY): Payer: Self-pay | Admitting: *Deleted

## 2017-09-15 ENCOUNTER — Emergency Department (HOSPITAL_COMMUNITY)
Admission: EM | Admit: 2017-09-15 | Discharge: 2017-09-15 | Disposition: A | Discharge status: Home / Self Care | Payer: Self-pay | Attending: Emergency Medicine | Admitting: Emergency Medicine

## 2017-09-15 ENCOUNTER — Other Ambulatory Visit: Payer: Self-pay

## 2017-09-15 DIAGNOSIS — R110 Nausea: Secondary | ICD-10-CM | POA: Insufficient documentation

## 2017-09-15 DIAGNOSIS — F17210 Nicotine dependence, cigarettes, uncomplicated: Secondary | ICD-10-CM | POA: Insufficient documentation

## 2017-09-15 DIAGNOSIS — K0889 Other specified disorders of teeth and supporting structures: Secondary | ICD-10-CM | POA: Insufficient documentation

## 2017-09-15 MED ORDER — HYDROCODONE-ACETAMINOPHEN 5-325 MG PO TABS
1.0000 | ORAL_TABLET | Freq: Once | ORAL | Status: AC
  Administered 2017-09-15: 1 via ORAL
  Filled 2017-09-15: qty 1

## 2017-09-15 MED ORDER — LIDOCAINE VISCOUS 2 % MT SOLN: 20.0000 mL | OROMUCOSAL | 0 refills | Status: DC | PRN

## 2017-09-15 MED ORDER — CLINDAMYCIN HCL 300 MG PO CAPS: 300.0000 mg | ORAL_CAPSULE | Freq: Three times a day (TID) | ORAL | 0 refills | Status: DC

## 2017-09-15 MED ORDER — PROMETHAZINE HCL 25 MG PO TABS: 25.0000 mg | ORAL_TABLET | Freq: Four times a day (QID) | ORAL | 0 refills | Status: DC | PRN

## 2017-09-15 NOTE — Discharge Instructions (Signed)
You have a dental injury/infection. It is very important that you get evaluated by a dentist as soon as possible. Call tomorrow to schedule an appointment. Ibuprofen and tylenol as needed for pain. Take your full course of antibiotics. Read the instructions below. ° °Eat a soft or liquid diet and rinse your mouth out after meals with warm water. You should see a dentist or return here at once if you have increased swelling, increased pain or uncontrolled bleeding from the site of your injury. ° °SEEK MEDICAL CARE IF:  °You have increased pain not controlled with medicines.  °You have swelling around your tooth, in your face or neck.  °You have bleeding which starts, continues, or gets worse.  °You have a fever >101 °If you are unable to open your mouth ° ° °

## 2017-09-15 NOTE — ED Provider Notes (Signed)
[facility] EMERGENCY DEPARTMENT Provider Note   CSN: [id number] Arrival date & time: 09/15/17  4098     History   Chief Complaint Chief Complaint  Patient presents with  . Dental Pain    HPI Kerry Young is a 52 y.o. female.  HPI 52 year old with history of my anxiety, migraines, sciatic nerve pain presents to the emergency department today with ongoing left lower back molar pain.  Patient states that her pain started on 2/18.  Was seen in the ED on 2/20.  She was given penicillin and naproxen.  States that the penicillin is making her stomach upset and that the naproxen is not helping her pain.  Patient describes the pain as throbbing and shooting in nature.  States that she does not have any insurance or money to follow-up with a dentist at this time.  She denies any difficulties breathing, swallowing, fevers, chills.  She does report some nausea due to the penicillin and pain.  Denies any other associated symptoms including abdominal pain, urinary symptoms, diarrhea.  Patient is continuing to take the naproxen and Tylenol without any relief. Past Medical History:  Diagnosis Date  . Anxiety   . Migraines   . Sciatica   . UTI (urinary tract infection)     There are no active problems to display for this patient.   History reviewed. No pertinent surgical history.  OB History    No data available       Home Medications    Prior to Admission medications   Medication Sig Start Date End Date Taking? Authorizing Provider  ALPRAZolam Prudy Feeler) 1 MG tablet Take 1 mg by mouth at bedtime as needed for anxiety.     [provider]  clindamycin (CLEOCIN) 300 MG capsule Take 1 capsule (300 mg total) by mouth 3 (three) times daily. 09/15/17   Rise Mu, PA-C  HYDROcodone-acetaminophen (NORCO/VICODIN) 5-325 MG tablet Take 1 tablet by mouth every 6 (six) hours as needed for moderate pain or severe pain. 07/04/17   Ward, Chase Picket, PA-C  lidocaine  (XYLOCAINE) 2 % solution Use as directed 20 mLs in the mouth or throat as needed for mouth pain. 09/15/17   Rise Mu, PA-C  methocarbamol (ROBAXIN) 500 MG tablet Take 1 tablet (500 mg total) by mouth 2 (two) times daily as needed for muscle spasms. 07/04/17   Ward, Chase Picket, PA-C  naproxen (NAPROSYN) 500 MG tablet Take 1 tablet (500 mg total) by mouth 2 (two) times daily as needed. 09/08/17   Robinson, [place] N, PA-C  penicillin v potassium (VEETID) 500 MG tablet Take 1 tablet (500 mg total) by mouth 4 (four) times daily for 7 days. 09/08/17 09/15/17  Robinson, [place] N, PA-C  phenylephrine-shark liver oil-mineral oil-petrolatum (PREPARATION H) 0.25-3-14-71.9 % rectal ointment Place 1 application rectally 2 (two) times daily as needed for hemorrhoids.    [provider]  potassium chloride (K-DUR) 10 MEQ tablet Take 1 tablet (10 mEq total) by mouth daily. 04/25/17   Eber Hong, MD  pramoxine (PROCTOFOAM) 1 % foam Place 1 application rectally 3 (three) times daily as needed for anal itching. 04/25/17   Eber Hong, MD  promethazine (PHENERGAN) 25 MG tablet Take 1 tablet (25 mg total) by mouth every 6 (six) hours as needed for nausea or vomiting. 09/15/17   Rise Mu, PA-C    Family History History reviewed. No pertinent family history.  Social History Social History   Tobacco Use  . Smoking  status: Current Every Day Smoker    Packs/day: 0.50    Types: Cigarettes  . Smokeless tobacco: Never Used  Substance Use Topics  . Alcohol use: No    Comment: occ  . Drug use: No     Allergies   Other; Percocet [oxycodone-acetaminophen]; and Zofran [ondansetron hcl]   Review of Systems Review of Systems  Constitutional: Negative for chills and fever.  HENT: Positive for dental problem. Negative for trouble swallowing.   Gastrointestinal: Positive for nausea. Negative for abdominal pain, diarrhea and vomiting.  Genitourinary: Negative for dysuria, frequency and  urgency.     Physical Exam Updated Vital Signs BP 106/77   Pulse 72   Temp 98.2 F (36.8 C) (Oral)   Resp 17   SpO2 95%   Physical Exam Constitutional: She appears well-developed and well-nourished. No distress.  Tolerating secretions  HENT:  Head: Normocephalic and atraumatic.  Mouth/Throat: Uvula is midline and oropharynx is clear and moist. No trismus in the jaw. Dental caries present. No dental abscesses or uvula swelling.    Left lower wisdom tooth is decayed almost to gingiva.  Tender to palpation of the left lower molar surrounding gingival tenderness without significant erythema, no fluctuance. No sublingual edema.  There is no gross abscess.  No sublingual or submandibular swelling.  No trismus noted.  Uvula is midline.   Eyes: Conjunctivae are normal.  Ears: TM is unremarkable laterally.  Canals are normal.  No pain with manipulation of the external ear. Neck: Normal range of motion. Neck supple.  Cardiovascular: Normal rate and intact distal pulses.  Pulmonary/Chest: Effort normal. No stridor.  Lymphadenopathy:    She has no cervical adenopathy.  Psychiatric: She has a normal mood and affect. Her behavior is normal.  Nursing note and vitals reviewed.     ED Treatments / Results  Labs (all labs ordered are listed, but only abnormal results are displayed) Labs Reviewed - No data to display  EKG  EKG Interpretation None       Radiology No results found.  Procedures Procedures (including critical care time)  Medications Ordered in ED Medications  HYDROcodone-acetaminophen (NORCO/VICODIN) 5-325 MG per tablet 1 tablet (1 tablet Oral Given 09/15/17 0836)     Initial Impression / Assessment and Plan / ED Course  I have reviewed the triage vital signs and the nursing notes.  Pertinent labs & imaging results that were available during my care of the patient were reviewed by me and considered in my medical decision making (see chart for details).      Patient with toothache that is ongoing despite naproxen and penicillin.  Patient states the penicillin is making her stomach upset.  Patient denies any difficulties breathing, swallowing, fevers.  No significant swelling noted.  Oropharynx is clear.  No gross abscess.  Exam unconcerning for Ludwig's angina or spread of infection.  He is requesting prescription for Phenergan.  Instructed patient that I will change her antibiotics to clindamycin and to her take a probiotic to help with the nausea.  Have given her Phenergan.  Encouraged her to continue the naproxen and Tylenol.  Have given her referral to dentistry.   Pt is hemodynamically stable, in NAD, & able to ambulate in the ED. Evaluation does not show pathology that would require ongoing emergent intervention or inpatient treatment. I explained the diagnosis to the patient. Pain has been managed & has no complaints prior to dc. Pt is comfortable with above plan and is stable for discharge at this time.  All questions were answered prior to disposition. Strict return precautions for f/u to the ED were discussed. Encouraged follow up with PCP.   Final Clinical Impressions(s) / ED Diagnoses   Final diagnoses:  Pain, dental    ED Discharge Orders        Ordered    clindamycin (CLEOCIN) 300 MG capsule  3 times daily     09/15/17 0841    promethazine (PHENERGAN) 25 MG tablet  Every 6 hours PRN     09/15/17 0841    lidocaine (XYLOCAINE) 2 % solution  As needed     09/15/17 0841       Rise Mu, PA-C 09/15/17 0855    Gerhard Munch, MD 09/15/17 [phone]

## 2017-09-15 NOTE — ED Triage Notes (Signed)
[facility] for same but no relief with meds given. States she is having left lower dental pain. Airway intact.

## 2017-10-17 ENCOUNTER — Emergency Department (HOSPITAL_COMMUNITY): Admission: EM | Admit: 2017-10-17 | Discharge: 2017-10-17 | Discharge status: Against Medical Advice | Payer: Self-pay

## 2017-10-17 NOTE — ED Notes (Signed)
Patient called with no response.

## 2017-10-17 NOTE — ED Notes (Signed)
Patient called for triage with no response from lobby, restroom or outside.

## 2017-10-18 ENCOUNTER — Other Ambulatory Visit: Payer: Self-pay

## 2017-10-18 ENCOUNTER — Encounter (HOSPITAL_COMMUNITY): Payer: Self-pay

## 2017-10-18 ENCOUNTER — Emergency Department (HOSPITAL_COMMUNITY): Payer: Self-pay

## 2017-10-18 ENCOUNTER — Emergency Department (HOSPITAL_COMMUNITY)
Admission: EM | Admit: 2017-10-18 | Discharge: 2017-10-18 | Disposition: A | Discharge status: Home / Self Care | Payer: Self-pay | Attending: Emergency Medicine | Admitting: Emergency Medicine

## 2017-10-18 DIAGNOSIS — S46912A Strain of unspecified muscle, fascia and tendon at shoulder and upper arm level, left arm, initial encounter: Secondary | ICD-10-CM | POA: Insufficient documentation

## 2017-10-18 DIAGNOSIS — Y999 Unspecified external cause status: Secondary | ICD-10-CM | POA: Insufficient documentation

## 2017-10-18 DIAGNOSIS — F17210 Nicotine dependence, cigarettes, uncomplicated: Secondary | ICD-10-CM | POA: Insufficient documentation

## 2017-10-18 DIAGNOSIS — X503XXA Overexertion from repetitive movements, initial encounter: Secondary | ICD-10-CM | POA: Insufficient documentation

## 2017-10-18 DIAGNOSIS — T148XXA Other injury of unspecified body region, initial encounter: Secondary | ICD-10-CM

## 2017-10-18 DIAGNOSIS — Y929 Unspecified place or not applicable: Secondary | ICD-10-CM | POA: Insufficient documentation

## 2017-10-18 DIAGNOSIS — Y93E6 Activity, residential relocation: Secondary | ICD-10-CM | POA: Insufficient documentation

## 2017-10-18 DIAGNOSIS — M542 Cervicalgia: Secondary | ICD-10-CM | POA: Insufficient documentation

## 2017-10-18 IMAGING — CR DG SHOULDER 2+V*L*
2 series · 2 of 2 positions shown · non-contrast
Comparison: None.

CLINICAL DATA: Left shoulder pain common no known injury, initial
encounter

EXAM:
LEFT SHOULDER - 2+ VIEW

[w shoulder external left]
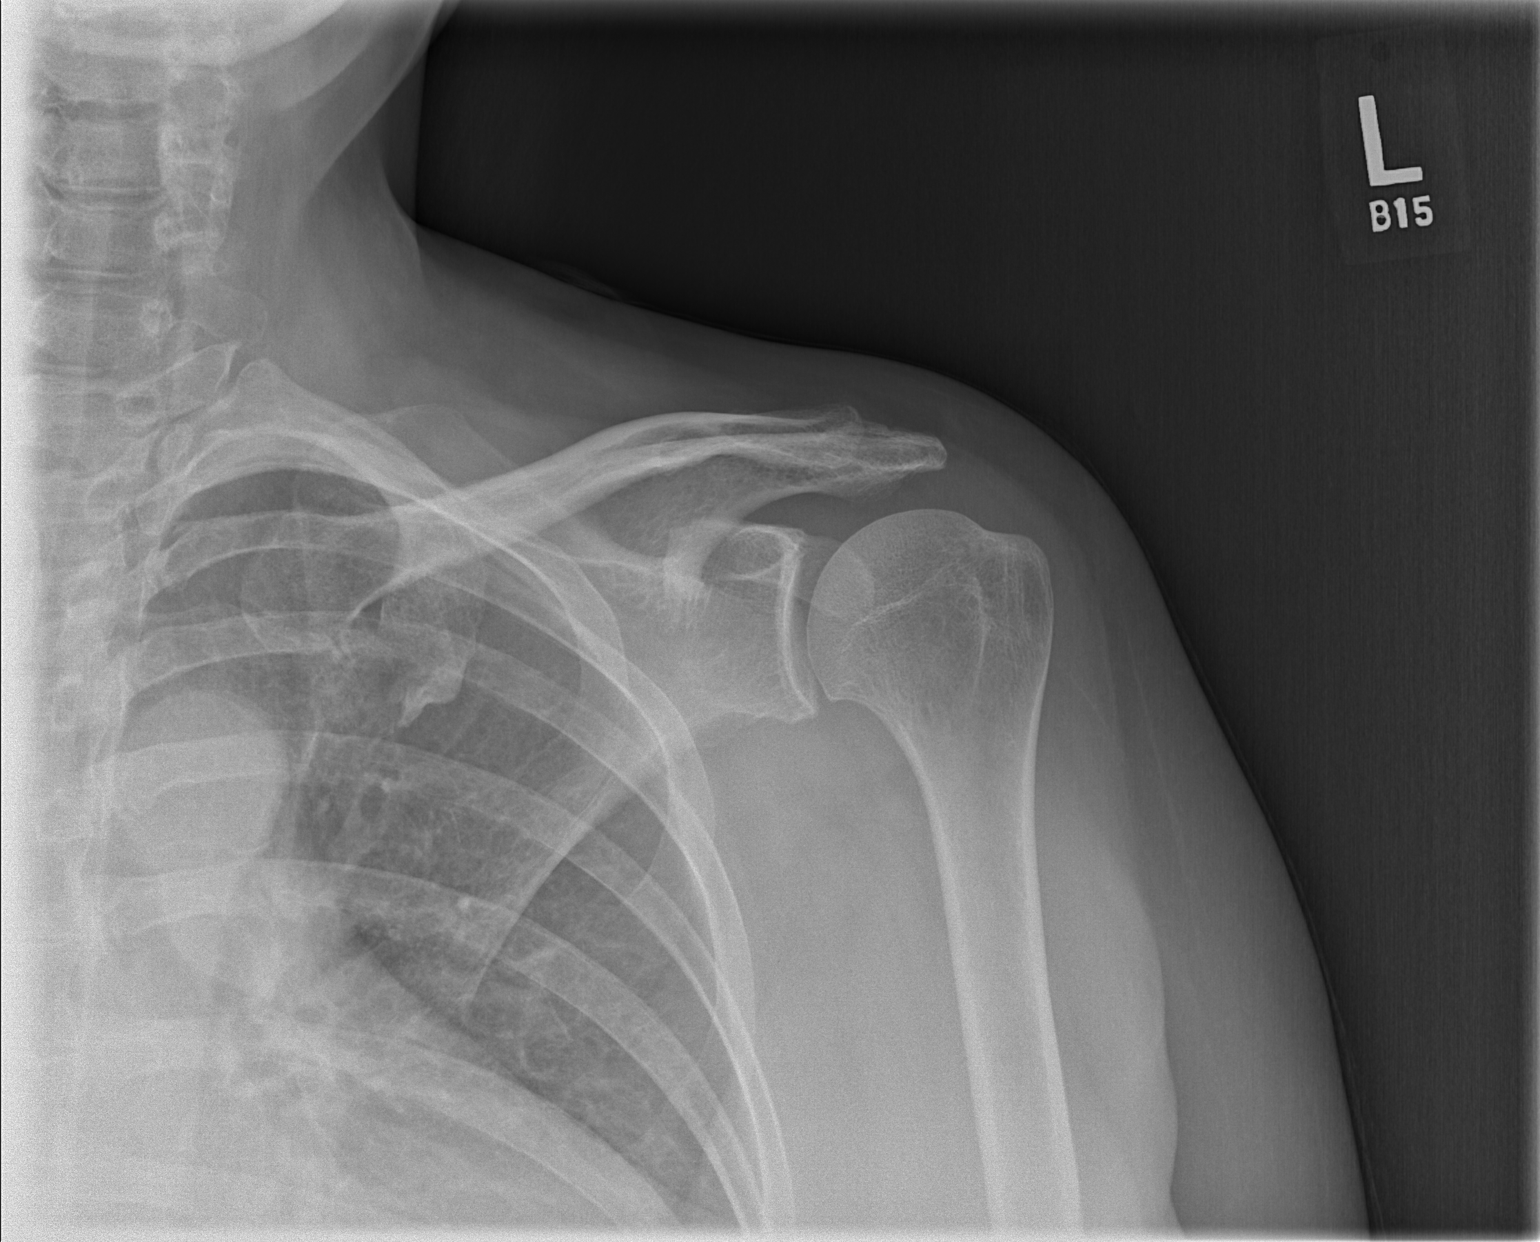

[w shoulder y-view left]
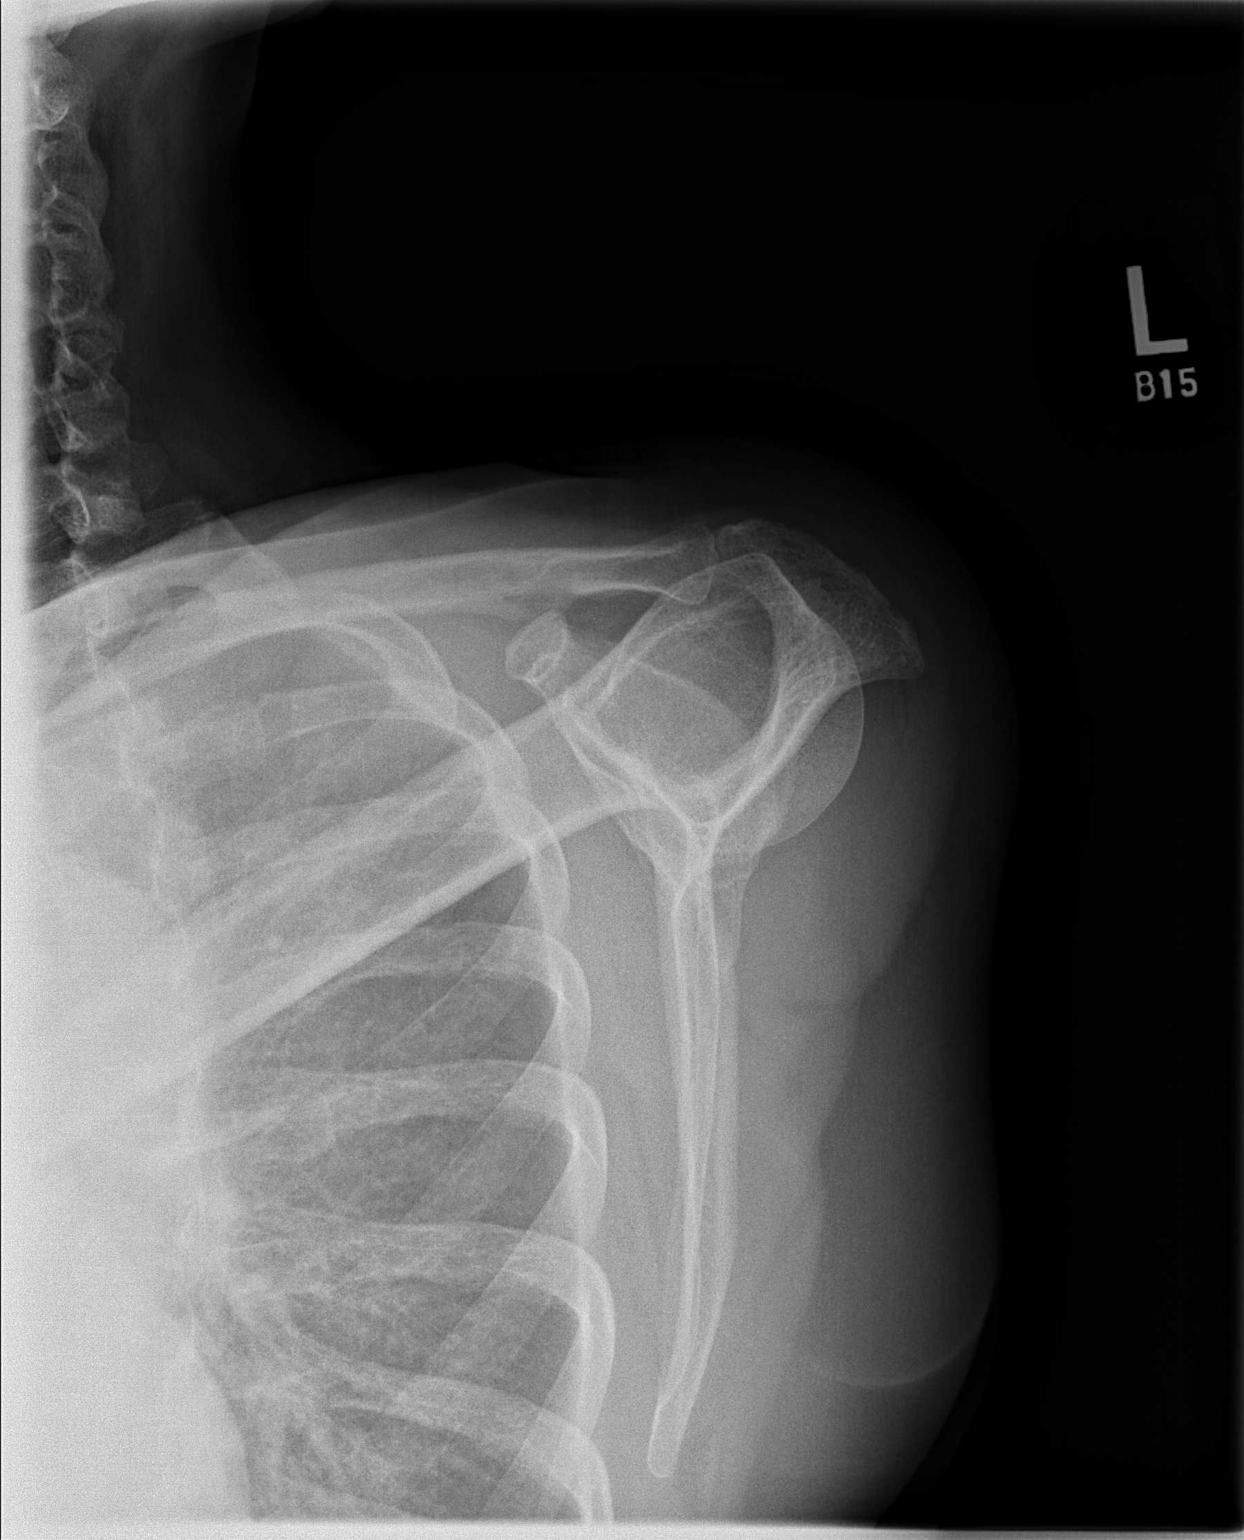

[2 of 2 positions shown; findings below may reference images not displayed]

FINDINGS: Mild degenerative changes of the acromioclavicular joint are seen.
No fracture or dislocation is noted. No soft tissue abnormality is
seen.
IMPRESSION: Mild degenerative change without acute abnormality.

## 2017-10-18 MED ORDER — CYCLOBENZAPRINE HCL 10 MG PO TABS: 10.0000 mg | ORAL_TABLET | Freq: Two times a day (BID) | ORAL | 0 refills | Status: DC | PRN

## 2017-10-18 MED ORDER — KETOROLAC TROMETHAMINE 30 MG/ML IJ SOLN
30.0000 mg | Freq: Once | INTRAMUSCULAR | Status: DC
  Filled 2017-10-18: qty 1

## 2017-10-18 NOTE — ED Triage Notes (Addendum)
Patient c/o left scapula pain that radiates into the left lateral neck since yesterday.. Pain is worse when she turns her neck and ambulates. Patient states she has recently moved and has been lifting and unpacking things.

## 2017-10-18 NOTE — ED Provider Notes (Signed)
[facility]-EMERGENCY DEPT Provider Note   CSN: [id number] Arrival date & time: 10/18/17  0945     History   Chief Complaint Chief Complaint  Patient presents with  . scapula pain    HPI Kerry Young is a 52 y.o. female.  HPI  Kerry Young is a 52 year old female with a history of anxiety, sciatica and migraines who presents to the emergency department for evaluation of left scapula pain.  Patient reports that she recently moved and has been lifting heavy boxes for the past few days.  Reports yesterday afternoon she developed pain over the left scapula and neck.  She reports that her muscle feels very tight.  States that pain is 8/10 in severity and constant.  It is worsened when she rotates the neck to the left or moves her left arm in any direction.  She tried taking Tylenol last night and taking a hot shower this morning without significant improvement in her pain.  She denies previous shoulder surgeries.  Denies recent injury to this shoulder.  Denies joint swelling, redness, fever, chills, numbness, weakness, chest pain, shortness of breath.  Past Medical History:  Diagnosis Date  . Anxiety   . Migraines   . Sciatica   . UTI (urinary tract infection)     There are no active problems to display for this patient.   History reviewed. No pertinent surgical history.   OB History   None      Home Medications    Prior to Admission medications   Medication Sig Start Date End Date Taking? Authorizing Provider  ALPRAZolam Prudy Feeler) 1 MG tablet Take 1 mg by mouth at bedtime as needed for anxiety.     [provider]  clindamycin (CLEOCIN) 300 MG capsule Take 1 capsule (300 mg total) by mouth 3 (three) times daily. 09/15/17   Rise Mu, PA-C  HYDROcodone-acetaminophen (NORCO/VICODIN) 5-325 MG tablet Take 1 tablet by mouth every 6 (six) hours as needed for moderate pain or severe pain. 07/04/17   Ward, Chase Picket, PA-C  lidocaine (XYLOCAINE)  2 % solution Use as directed 20 mLs in the mouth or throat as needed for mouth pain. 09/15/17   Rise Mu, PA-C  methocarbamol (ROBAXIN) 500 MG tablet Take 1 tablet (500 mg total) by mouth 2 (two) times daily as needed for muscle spasms. 07/04/17   Ward, Chase Picket, PA-C  naproxen (NAPROSYN) 500 MG tablet Take 1 tablet (500 mg total) by mouth 2 (two) times daily as needed. 09/08/17   Robinson, [place] N, PA-C  phenylephrine-shark liver oil-mineral oil-petrolatum (PREPARATION H) 0.25-3-14-71.9 % rectal ointment Place 1 application rectally 2 (two) times daily as needed for hemorrhoids.    [provider]  potassium chloride (K-DUR) 10 MEQ tablet Take 1 tablet (10 mEq total) by mouth daily. 04/25/17   Eber Hong, MD  pramoxine (PROCTOFOAM) 1 % foam Place 1 application rectally 3 (three) times daily as needed for anal itching. 04/25/17   Eber Hong, MD  promethazine (PHENERGAN) 25 MG tablet Take 1 tablet (25 mg total) by mouth every 6 (six) hours as needed for nausea or vomiting. 09/15/17   Rise Mu, PA-C    Family History History reviewed. No pertinent family history.  Social History Social History   Tobacco Use  . Smoking status: Current Every Day Smoker    Packs/day: 0.15    Types: Cigarettes  . Smokeless tobacco: Never Used  Substance Use Topics  . Alcohol use: Yes  Comment: rare  . Drug use: No     Allergies   Other; Percocet [oxycodone-acetaminophen]; and Zofran [ondansetron hcl]   Review of Systems Review of Systems  Constitutional: Negative for chills and fever.  Respiratory: Negative for shortness of breath.   Cardiovascular: Negative for chest pain.  Musculoskeletal: Positive for myalgias (left neck and shoulder pain). Negative for joint swelling.  Skin: Negative for color change and wound.  Neurological: Negative for weakness and numbness.     Physical Exam Updated Vital Signs BP 117/90 (BP Location: Right Arm)   Pulse 99   Temp  (!) 97.5 F (36.4 C) (Oral)   Resp 18   Ht 5\' 2"  (1.575 m)   Wt 80.7 kg (178 lb)   SpO2 98%   BMI 32.56 kg/m   Physical Exam  Constitutional: She appears well-developed and well-nourished. No distress.  HENT:  Head: Normocephalic and atraumatic.  Eyes: Right eye exhibits no discharge. Left eye exhibits no discharge.  Neck: Normal range of motion. Neck supple.  Tender to palpation over left paraspinal muscles of the cervical spine which feel tight. Full active ROM of the neck, although acutely tender with neck extension and left lateral flexion. No overlying bruising or erythema.   Cardiovascular: Normal rate and regular rhythm. Exam reveals no friction rub.  No murmur heard. Pulmonary/Chest: Effort normal and breath sounds normal. No stridor. No respiratory distress. She has no wheezes. She has no rales.  Musculoskeletal:       Arms: Left shoulder with tenderness to palpation as depicted in image. Full active ROM, although tender with shoulder abduction and flexion. Negative empty can test. No swelling, erythema or ecchymosis present. No step-off, crepitus, or deformity appreciated. 5/5 muscle strength of UE. 2+ radial pulse. Distal sensation to light touch intact in bilateral UE.   Neurological: She is alert. Coordination normal.  Skin: Skin is warm and dry. Capillary refill takes less than 2 seconds. She is not diaphoretic.  Psychiatric: She has a normal mood and affect. Her behavior is normal.  Nursing note and vitals reviewed.    ED Treatments / Results  Labs (all labs ordered are listed, but only abnormal results are displayed) Labs Reviewed - No data to display  EKG None  Radiology Dg Shoulder Left  Result Date: 10/18/2017 CLINICAL DATA:  Left shoulder pain common no known injury, initial encounter EXAM: LEFT SHOULDER - 2+ VIEW COMPARISON:  None. FINDINGS: Mild degenerative changes of the acromioclavicular joint are seen. No fracture or dislocation is noted. No soft  tissue abnormality is seen. IMPRESSION: Mild degenerative change without acute abnormality. Electronically Signed   By: Alcide Clever M.D.   On: 10/18/2017 13:05    Procedures Procedures (including critical care time)  Medications Ordered in ED Medications  ketorolac (TORADOL) 30 MG/ML injection 30 mg (30 mg Intramuscular Refused 10/18/17 1357)     Initial Impression / Assessment and Plan / ED Course  I have reviewed the triage vital signs and the nursing notes.  Pertinent labs & imaging results that were available during my care of the patient were reviewed by me and considered in my medical decision making (see chart for details).    X-ray of the left shoulder reveals mild degenerative changes of the left AC joint.  No acute fracture or abnormality.  Tenderness is located over the upper and middle fibers of the trapezius muscle.  Exam consistent with musculoskeletal strain.  Left upper extremity is neurovascularly intact on exam.  No erythema, warmth or  signs of infection.  No concern for ACS given no associated chest pain or shortness of breath and tenderness is reproducible to palpation and worsened with arm and neck movement.  Patient's vital signs stable.  Discussed symptomatic management with NSAIDs, Tylenol and heat.  We will also discharge home with a muscle relaxer.  Counseled patient that this medicine can make her drowsy and she should not drive or work while taking it.  Patient given Toradol in the emergency department.  Gave her option for steroid burst and she declines.  Counseled her to follow-up with her primary care doctor for recheck.  Patient agrees and voiced understanding to the above plan and has no complaints prior to discharge.  Final Clinical Impressions(s) / ED Diagnoses   Final diagnoses:  Muscle strain    ED Discharge Orders        Ordered    cyclobenzaprine (FLEXERIL) 10 MG tablet  2 times daily PRN     10/18/17 1402       Kellie ShropshireShrosbree, Emily J, PA-C 10/18/17  1402    Donnetta Hutchingook, Brian, MD 10/22/17 1347

## 2017-10-18 NOTE — ED Notes (Signed)
Bed: WTR8 Expected date:  Expected time:  Means of arrival:  Comments: 

## 2017-10-18 NOTE — Discharge Instructions (Signed)
Your x-ray was reassuring.  As we discussed please take Tylenol for your pain every 6 hours.  You can also take Motrin 600 mg every 6 hours for your pain.  Also apply heat to the back to help with your symptoms.  I have written you a prescription for muscle relaxer called Flexeril.  This medicine can make you drowsy so please do not drive or work while taking it.  Please try to get rest over the next few days and avoid heavy lifting.  Follow-up with your regular doctor if your symptoms are not improving in the next few days.  Return to the emergency department if you have any new or concerning symptoms.

## 2018-01-02 ENCOUNTER — Emergency Department (HOSPITAL_COMMUNITY)
Admission: EM | Admit: 2018-01-02 | Discharge: 2018-01-02 | Disposition: A | Discharge status: Home / Self Care | Payer: Self-pay | Attending: Emergency Medicine | Admitting: Emergency Medicine

## 2018-01-02 ENCOUNTER — Encounter (HOSPITAL_COMMUNITY): Payer: Self-pay | Admitting: Emergency Medicine

## 2018-01-02 DIAGNOSIS — F17210 Nicotine dependence, cigarettes, uncomplicated: Secondary | ICD-10-CM | POA: Insufficient documentation

## 2018-01-02 DIAGNOSIS — M5432 Sciatica, left side: Secondary | ICD-10-CM | POA: Insufficient documentation

## 2018-01-02 MED ORDER — HYDROCODONE-ACETAMINOPHEN 5-325 MG PO TABS
1.0000 | ORAL_TABLET | Freq: Once | ORAL | Status: AC
  Administered 2018-01-02: 1 via ORAL
  Filled 2018-01-02: qty 1

## 2018-01-02 MED ORDER — PROMETHAZINE HCL 25 MG PO TABS
12.5000 mg | ORAL_TABLET | Freq: Once | ORAL | Status: AC
  Administered 2018-01-02: 12.5 mg via ORAL
  Filled 2018-01-02: qty 1

## 2018-01-02 MED ORDER — METHOCARBAMOL 500 MG PO TABS: 500.0000 mg | ORAL_TABLET | Freq: Two times a day (BID) | ORAL | 0 refills | Status: DC

## 2018-01-02 MED ORDER — DICLOFENAC SODIUM 50 MG PO TBEC: 50.0000 mg | DELAYED_RELEASE_TABLET | Freq: Two times a day (BID) | ORAL | 0 refills | Status: DC

## 2018-01-02 MED ORDER — CYCLOBENZAPRINE HCL 10 MG PO TABS
10.0000 mg | ORAL_TABLET | Freq: Once | ORAL | Status: AC
  Administered 2018-01-02: 10 mg via ORAL
  Filled 2018-01-02: qty 1

## 2018-01-02 NOTE — ED Notes (Signed)
Bed: WTR7 Expected date:  Expected time:  Means of arrival:  Comments: 

## 2018-01-02 NOTE — ED Provider Notes (Signed)
[facility]-EMERGENCY DEPT Provider Note   CSN: [id number] Arrival date & time: 01/02/18  1101     History   Chief Complaint Chief Complaint  Patient presents with  . Back Pain    HPI Kerry Young is a 52 y.o. female who presents to the ED with back pain. Patient reports that the pain started yesterday after carrying 2 cases of soda up stairs. The pain is located in the left lower back and radiates to the left leg. Patient denies UTI symptoms. Patient reports she did not fall. Patient reports similar back pain in the past and always on the left side.  The history is provided by the patient. No language interpreter was used.  Back Pain   This is a new problem. The current episode started yesterday. The problem occurs constantly. The problem has been gradually worsening. The pain is associated with lifting heavy objects. The pain is present in the lumbar spine. The pain is moderate. The symptoms are aggravated by bending and twisting. Pertinent negatives include no bowel incontinence and no bladder incontinence.    Past Medical History:  Diagnosis Date  . Anxiety   . Migraines   . Sciatica   . UTI (urinary tract infection)     There are no active problems to display for this patient.   History reviewed. No pertinent surgical history.   OB History   None      Home Medications    Prior to Admission medications   Medication Sig Start Date End Date Taking? Authorizing Provider  ALPRAZolam Prudy Feeler) 1 MG tablet Take 1 mg by mouth at bedtime as needed for anxiety.     [provider]  diclofenac (VOLTAREN) 50 MG EC tablet Take 1 tablet (50 mg total) by mouth 2 (two) times daily. 01/02/18   Janne Napoleon, NP  lidocaine (XYLOCAINE) 2 % solution Use as directed 20 mLs in the mouth or throat as needed for mouth pain. 09/15/17   Rise Mu, PA-C  methocarbamol (ROBAXIN) 500 MG tablet Take 1 tablet (500 mg total) by mouth 2 (two) times daily.  01/02/18   Janne Napoleon, NP  phenylephrine-shark liver oil-mineral oil-petrolatum (PREPARATION H) 0.25-3-14-71.9 % rectal ointment Place 1 application rectally 2 (two) times daily as needed for hemorrhoids.    [provider]  potassium chloride (K-DUR) 10 MEQ tablet Take 1 tablet (10 mEq total) by mouth daily. 04/25/17   Eber Hong, MD  pramoxine (PROCTOFOAM) 1 % foam Place 1 application rectally 3 (three) times daily as needed for anal itching. 04/25/17   Eber Hong, MD  promethazine (PHENERGAN) 25 MG tablet Take 1 tablet (25 mg total) by mouth every 6 (six) hours as needed for nausea or vomiting. 09/15/17   Rise Mu, PA-C    Family History No family history on file.  Social History Social History   Tobacco Use  . Smoking status: Current Every Day Smoker    Packs/day: 0.15    Types: Cigarettes  . Smokeless tobacco: Never Used  Substance Use Topics  . Alcohol use: Yes    Comment: rare  . Drug use: No     Allergies   Other; Percocet [oxycodone-acetaminophen]; and Zofran [ondansetron hcl]   Review of Systems Review of Systems  Gastrointestinal: Negative for bowel incontinence.  Genitourinary: Negative for bladder incontinence.  Musculoskeletal: Positive for arthralgias and back pain.  All other systems reviewed and are negative.    Physical Exam Updated Vital Signs BP  117/87 (BP Location: Right Arm)   Pulse 75   Temp 97.9 F (36.6 C) (Oral)   Resp 17   SpO2 100%   Physical Exam  Constitutional: She appears well-developed and well-nourished. No distress.  HENT:  Head: Normocephalic and atraumatic.  Eyes: EOM are normal.  Neck: Neck supple.  Cardiovascular: Normal rate and intact distal pulses.  Pulmonary/Chest: Effort normal.  Musculoskeletal:       Lumbar back: She exhibits tenderness and spasm. She exhibits no deformity and normal pulse. Decreased range of motion: due to pain.       Back:  Neurological: She is alert. She has normal  strength. No sensory deficit. Gait normal.  Reflex Scores:      Bicep reflexes are 2+ on the right side and 2+ on the left side.      Brachioradialis reflexes are 2+ on the right side and 2+ on the left side.      Patellar reflexes are 2+ on the right side and 2+ on the left side. Radial and pedal pulses 2+, adequate circulation, grips are equal.  Skin: Skin is warm and dry.  Psychiatric: She has a normal mood and affect. Her behavior is normal.  Nursing note and vitals reviewed.    ED Treatments / Results  Labs (all labs ordered are listed, but only abnormal results are displayed) Labs Reviewed - No data to display  Radiology No results found.  Procedures Procedures (including critical care time)  Medications Ordered in ED Medications  cyclobenzaprine (FLEXERIL) tablet 10 mg (10 mg Oral Given 01/02/18 1229)  HYDROcodone-acetaminophen (NORCO/VICODIN) 5-325 MG per tablet 1 tablet (1 tablet Oral Given 01/02/18 1229)  promethazine (PHENERGAN) tablet 12.5 mg (12.5 mg Oral Given 01/02/18 1250)   Patient's pain improved with medication here in the ED.   Initial Impression / Assessment and Plan / ED Course  I have reviewed the triage vital signs and the nursing notes. Patient with back pain.  No neurological deficits and normal neuro exam.  Patient can walk but states is painful.  No loss of bowel or bladder control.  No concern for cauda equina.  No fever, night sweats, weight loss, h/o cancer, IVDU.  RICE protocol and pain medicine indicated and discussed with patient. Will treat with muscle relaxer and NSAIDS. Patient to f/u with PCP. Final Clinical Impressions(s) / ED Diagnoses   Final diagnoses:  Sciatica, left side    ED Discharge Orders        Ordered    methocarbamol (ROBAXIN) 500 MG tablet  2 times daily     01/02/18 1220    diclofenac (VOLTAREN) 50 MG EC tablet  2 times daily     01/02/18 1220       Damian Leavelleese, EvaroHope M, [place]NP 01/02/18 1310    Lorre NickAllen, Anthony, MD 01/05/18  1238

## 2018-01-02 NOTE — ED Triage Notes (Signed)
Pt reports about couple hours after carrying 2 cases of soda yesterday up stairs starting having left lower back pain that radiates down left leg. Denies any falls

## 2018-01-02 NOTE — Discharge Instructions (Addendum)
Do not take the muscle relaxer while driving as it will make you sleepy. Follow up with your primary care doctor. Return here as needed.

## 2018-02-07 ENCOUNTER — Emergency Department (HOSPITAL_COMMUNITY): Payer: Self-pay

## 2018-02-07 ENCOUNTER — Emergency Department (HOSPITAL_COMMUNITY)
Admission: EM | Admit: 2018-02-07 | Discharge: 2018-02-07 | Disposition: A | Discharge status: Home / Self Care | Payer: Self-pay | Attending: Emergency Medicine | Admitting: Emergency Medicine

## 2018-02-07 ENCOUNTER — Encounter (HOSPITAL_COMMUNITY): Payer: Self-pay | Admitting: Emergency Medicine

## 2018-02-07 DIAGNOSIS — R109 Unspecified abdominal pain: Secondary | ICD-10-CM

## 2018-02-07 DIAGNOSIS — R194 Change in bowel habit: Secondary | ICD-10-CM | POA: Insufficient documentation

## 2018-02-07 DIAGNOSIS — R1084 Generalized abdominal pain: Secondary | ICD-10-CM | POA: Insufficient documentation

## 2018-02-07 DIAGNOSIS — Z79899 Other long term (current) drug therapy: Secondary | ICD-10-CM | POA: Insufficient documentation

## 2018-02-07 DIAGNOSIS — F17210 Nicotine dependence, cigarettes, uncomplicated: Secondary | ICD-10-CM | POA: Insufficient documentation

## 2018-02-07 LAB — COMPREHENSIVE METABOLIC PANEL
ALK PHOS: 62 U/L (ref 38–126)
ALT: 31 U/L (ref 0–44)
ANION GAP: 9 (ref 5–15)
AST: 26 U/L (ref 15–41)
Albumin: 4.1 g/dL (ref 3.5–5.0)
BUN: 14 mg/dL (ref 6–20)
CHLORIDE: 108 mmol/L (ref 98–111)
CO2: 23 mmol/L (ref 22–32)
Calcium: 9.8 mg/dL (ref 8.9–10.3)
Creatinine, Ser: 0.53 mg/dL (ref 0.44–1.00)
GFR calc Af Amer: >60 mL/min (ref >60)
GFR calc non Af Amer: >60 mL/min (ref >60)
Glucose, Bld: 95 mg/dL (ref 70–99)
POTASSIUM: 4 mmol/L (ref 3.5–5.1)
Sodium: 140 mmol/L (ref 135–145)
Total Bilirubin: 0.4 mg/dL (ref 0.3–1.2)
Total Protein: 7.7 g/dL (ref 6.5–8.1)

## 2018-02-07 LAB — URINALYSIS, ROUTINE W REFLEX MICROSCOPIC
BILIRUBIN URINE: NEGATIVE
Glucose, UA: NEGATIVE mg/dL
HGB URINE DIPSTICK: NEGATIVE
KETONES UR: NEGATIVE mg/dL
Leukocytes, UA: NEGATIVE
NITRITE: NEGATIVE
Protein, ur: NEGATIVE mg/dL
SPECIFIC GRAVITY, URINE: 1.019 (ref 1.005–1.030)
pH: 5 (ref 5.0–8.0)

## 2018-02-07 LAB — I-STAT BETA HCG BLOOD, ED (MC, WL, AP ONLY): I-stat hCG, quantitative: <5 m[IU]/mL (ref <5)

## 2018-02-07 LAB — CBC
HEMATOCRIT: 47.3 % — AB (ref 36.0–46.0)
HEMOGLOBIN: 15.8 g/dL — AB (ref 12.0–15.0)
MCH: 29.9 pg (ref 26.0–34.0)
MCHC: 33.4 g/dL (ref 30.0–36.0)
MCV: 89.4 fL (ref 78.0–100.0)
PLATELETS: 358 10*3/uL (ref 150–400)
RBC: 5.29 MIL/uL — ABNORMAL HIGH (ref 3.87–5.11)
RDW: 13.4 % (ref 11.5–15.5)
WBC: 9.7 10*3/uL (ref 4.0–10.5)

## 2018-02-07 LAB — LIPASE, BLOOD: LIPASE: 32 U/L (ref 11–51)

## 2018-02-07 IMAGING — CT CT ABD-PELV W/ CM
2 of 5 series · 16 of 46 positions shown, 18 images · IV contrast (ISOVUE)
Comparison: [DATE]

CLINICAL DATA: Pt reports for 3 weeks had constant diarrhea and
lower abd pains. Reports 7-10 times a day. denies any recent
infection, antibiotics use, or being around anyone sick.

EXAM:
CT ABDOMEN AND PELVIS WITH CONTRAST
TECHNIQUE: Multidetector CT imaging of the abdomen and pelvis was performed
using the standard protocol following bolus administration of
intravenous contrast.
CONTRAST:  100mL [HO] IOPAMIDOL ([HO]) INJECTION 61%

[Series 2: axial st · axial · 0.68mm/px · z∈[+1047,+1447]mm · 13 of 94 slices shown, 15 images]
[im 7/94  soft-tissue]
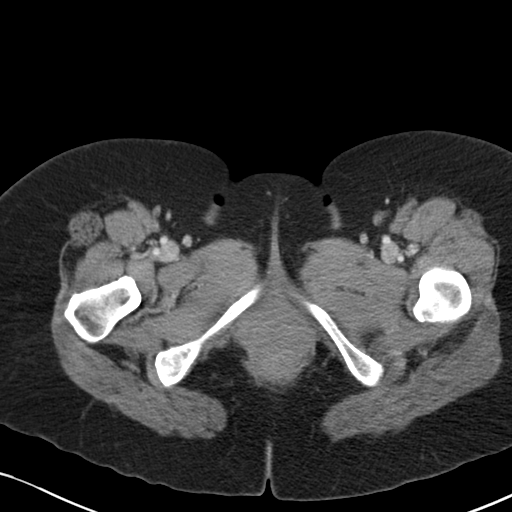
[im 7/94  bone]
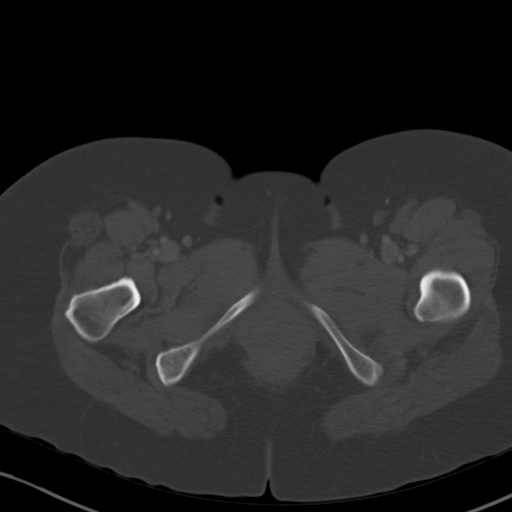
[im 13/94  soft-tissue]
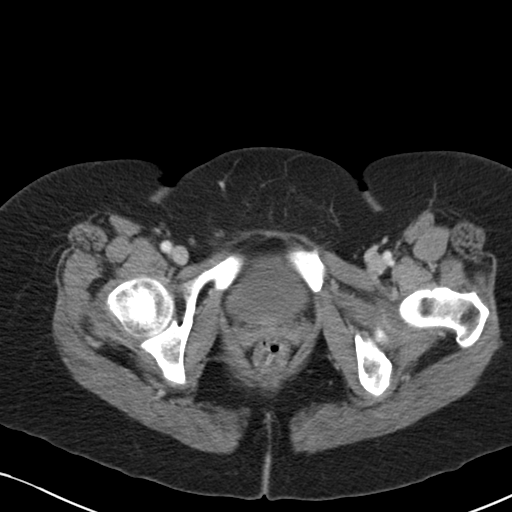
[im 19/94  soft-tissue]
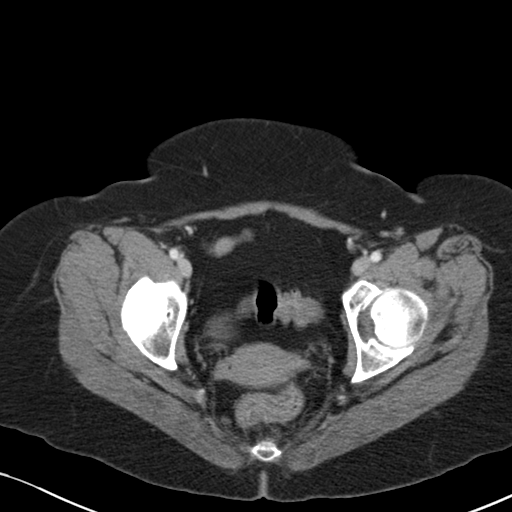
[im 25/94  soft-tissue]
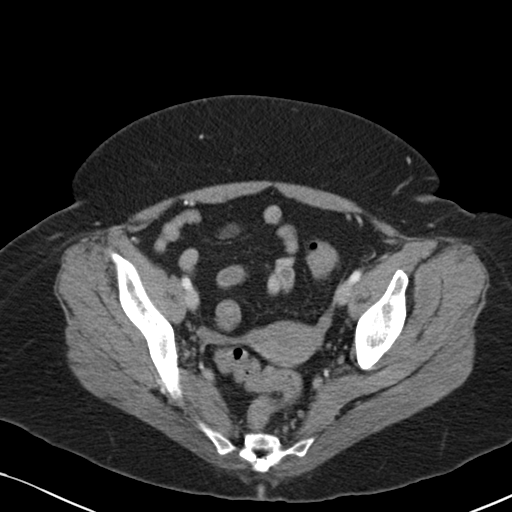
[im 32/94  soft-tissue]
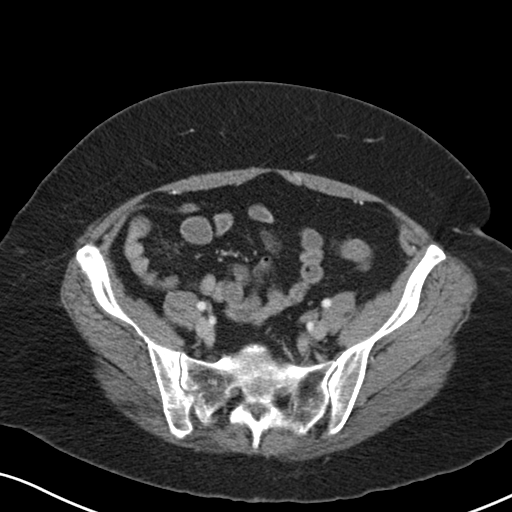
[im 38/94  soft-tissue]
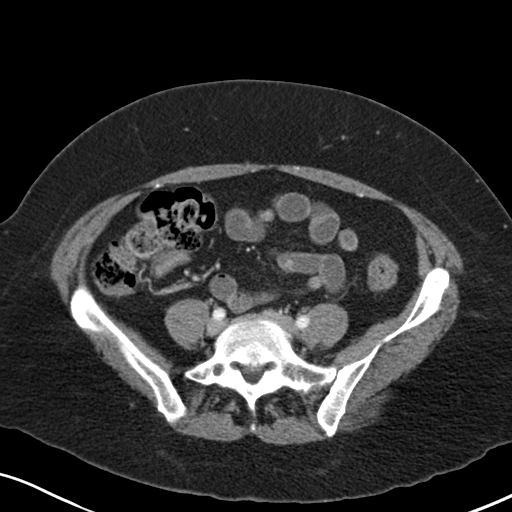
[im 50/94  soft-tissue]
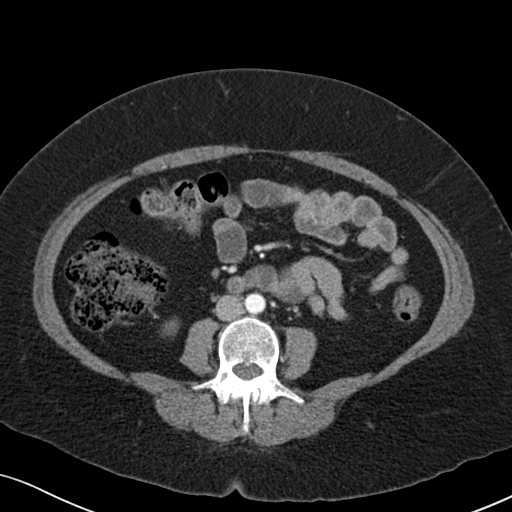
[im 56/94  soft-tissue]
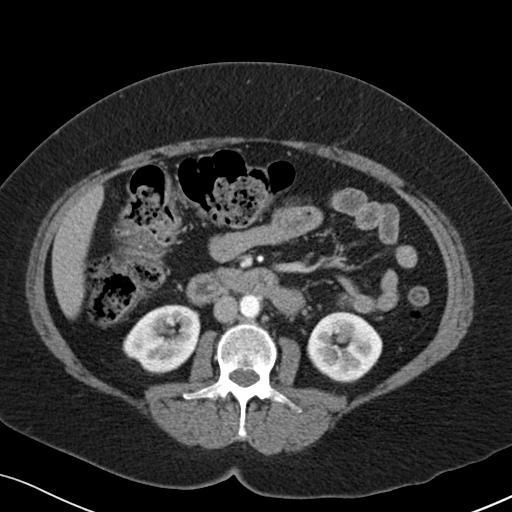
[im 63/94  soft-tissue]
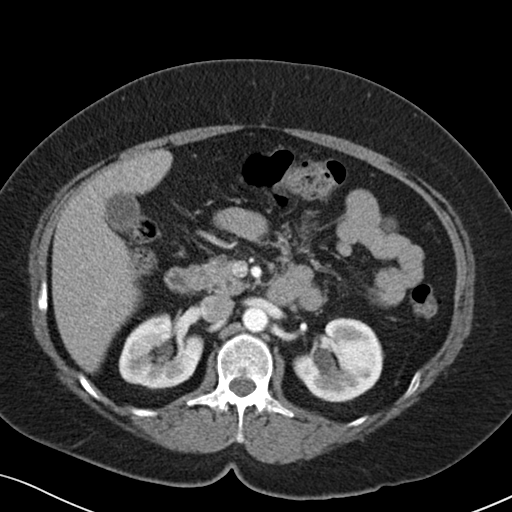
[im 63/94  bone]
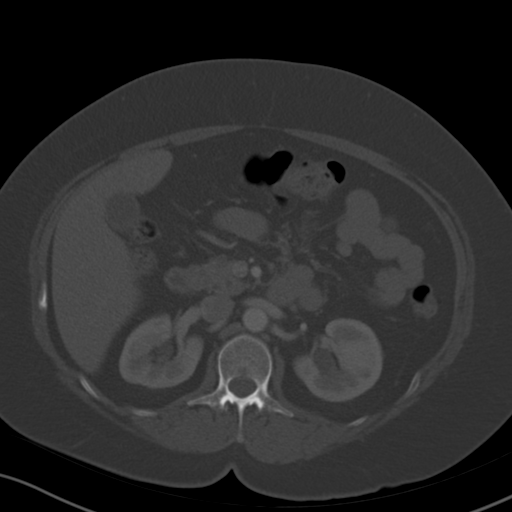
[im 69/94  soft-tissue]
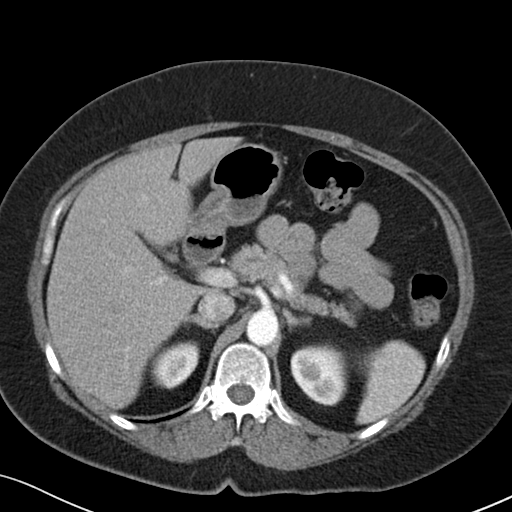
[im 75/94  soft-tissue]
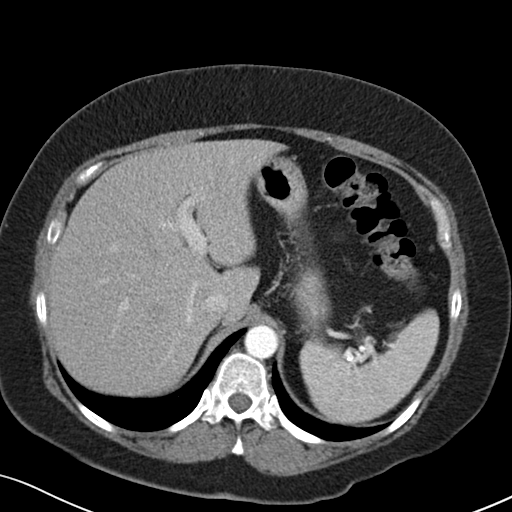
[im 81/94  soft-tissue]
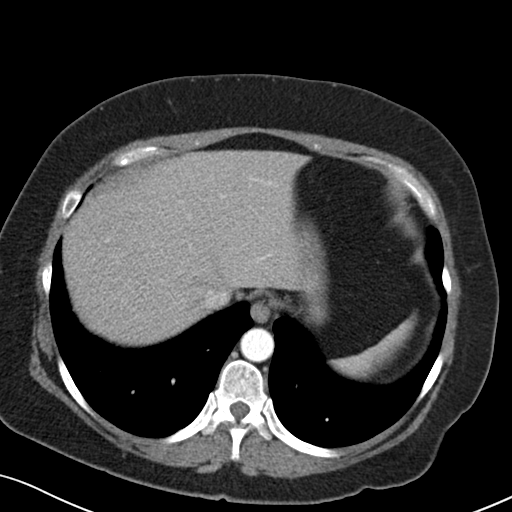
[im 87/94  soft-tissue]
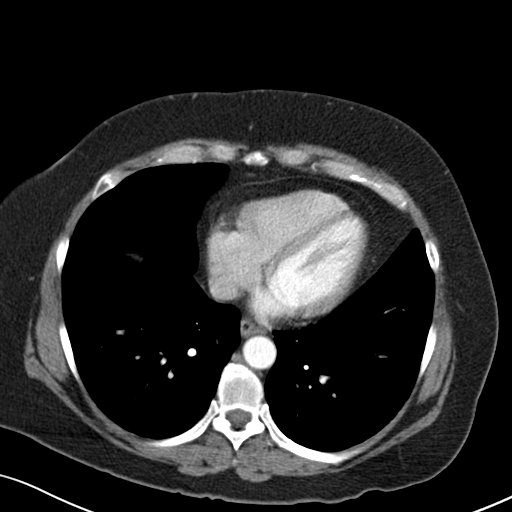

[Series 4: coronal st · coronal · 0.71mm/px · 3 of 87 slices shown]
[im 29/87  soft-tissue]
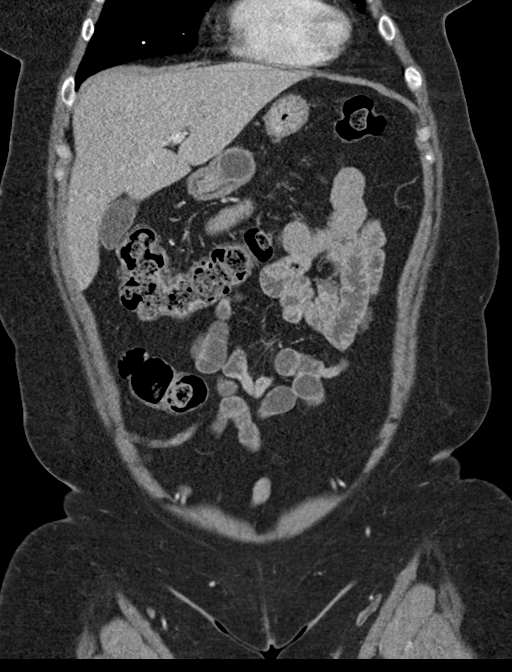
[im 39/87  soft-tissue]
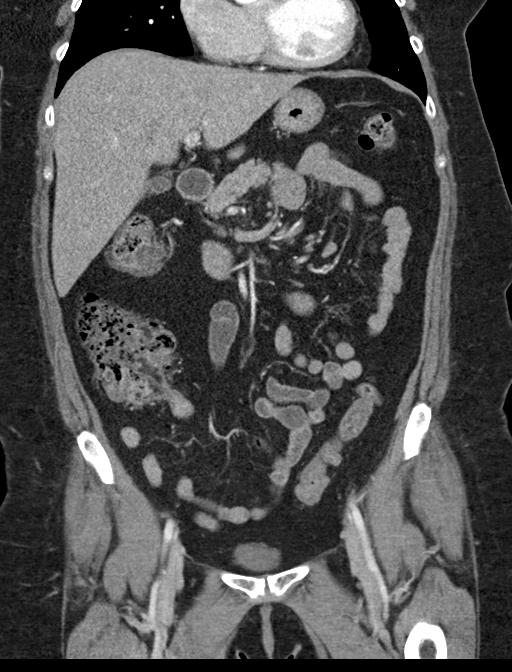
[im 48/87  soft-tissue]
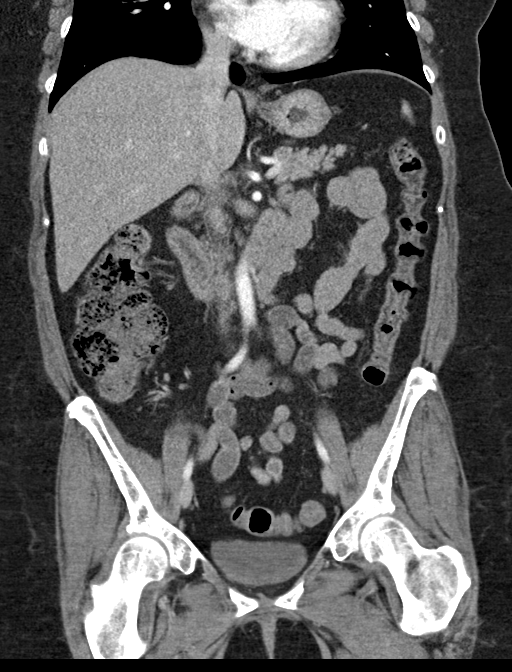

[16 of 46 positions shown; findings below may reference images not displayed]

FINDINGS: Lower chest: No acute abnormality.

Hepatobiliary: No focal liver abnormality is seen. No gallstones,
gallbladder wall thickening, or biliary dilatation.

Pancreas: Unremarkable. No pancreatic ductal dilatation or
surrounding inflammatory changes.

Spleen: Normal in size without focal abnormality.

Adrenals/Urinary Tract: Adrenal glands are unremarkable. Kidneys are
normal, without renal calculi, focal lesion, or hydronephrosis.
Bladder is unremarkable.

Stomach/Bowel: Stomach and small bowel decompressed. Normal
appendix. Moderate amount of fecal material in the proximal colon,
decompressed distally. No regional inflammatory/edematous change.

Vascular/Lymphatic: No significant vascular findings are present. No
enlarged abdominal or pelvic lymph nodes.

Reproductive: Uterus and bilateral adnexa are unremarkable.

Other: No ascites.  No free air.

Musculoskeletal: No acute or significant osseous findings.
IMPRESSION: 1. No acute findings.
2. Nondilated bowel with moderate proximal colonic fecal material.

## 2018-02-07 MED ORDER — DICYCLOMINE HCL 10 MG PO CAPS: 10.0000 mg | ORAL_CAPSULE | Freq: Once | ORAL | Status: DC

## 2018-02-07 MED ORDER — DICYCLOMINE HCL 20 MG PO TABS: 20.0000 mg | ORAL_TABLET | Freq: Three times a day (TID) | ORAL | 0 refills | Status: DC

## 2018-02-07 MED ORDER — DICYCLOMINE HCL 10 MG PO CAPS
20.0000 mg | ORAL_CAPSULE | Freq: Once | ORAL | Status: AC
  Administered 2018-02-07: 20 mg via ORAL
  Filled 2018-02-07: qty 2

## 2018-02-07 MED ORDER — PROMETHAZINE HCL 25 MG/ML IJ SOLN
12.5000 mg | Freq: Once | INTRAMUSCULAR | Status: AC
  Administered 2018-02-07: 12.5 mg via INTRAVENOUS
  Filled 2018-02-07 (×2): qty 1

## 2018-02-07 MED ORDER — IOPAMIDOL (ISOVUE-300) INJECTION 61%
INTRAVENOUS | Status: AC
  Filled 2018-02-07: qty 100

## 2018-02-07 MED ORDER — IOPAMIDOL (ISOVUE-300) INJECTION 61%
100.0000 mL | Freq: Once | INTRAVENOUS | Status: AC | PRN
Start: 2018-02-07 — End: 2018-02-07
  Administered 2018-02-07: 100 mL via INTRAVENOUS

## 2018-02-07 MED ORDER — MORPHINE SULFATE (PF) 4 MG/ML IV SOLN
4.0000 mg | Freq: Once | INTRAVENOUS | Status: AC
  Administered 2018-02-07: 4 mg via INTRAVENOUS
  Filled 2018-02-07: qty 1

## 2018-02-07 NOTE — ED Triage Notes (Signed)
Pt reports for 3 weeks had constant diarrhea and lower abd pains. Reports 7-10 times a day. denies any recent infection, antibiotics use, or being around anyone sick.

## 2018-02-07 NOTE — Discharge Instructions (Addendum)
Return if symptoms are getting worse. °

## 2018-02-07 NOTE — ED Provider Notes (Signed)
[facility]-EMERGENCY DEPT Provider Note   CSN: [id number] Arrival date & time: 02/07/18  1529     History   Chief Complaint Chief Complaint  Patient presents with  . Diarrhea  . abd pain    HPI Kerry Young is a 52 y.o. female.  The history is provided by the patient.  She has history of anxiety and comes in with 3-week history of abdominal cramping and diarrhea.  She states that she has not had true diarrhea, but is running to have a bowel movement every hour.  She has had nocturnal symptoms.  Abdominal cramping is severe and she rates pain currently at 8/10.  It does not get any better after she moves her bowels.  She states that when she eats, she will have to run to the bathroom to move her bowels almost immediately.  Appetite has been diminished, but she has not lost any weight.  She denies any nausea or vomiting.  She denies fever or chills.  She has noted some intermittent blood and mucus in stools.  She has treated herself with loperamide and with probiotics without any benefit.  She denies nausea or vomiting.  She denies fever or chills or sweats.  She denies arthralgias or myalgias.  She denies any sick contacts.  She denies recent travel.  She denies recent antibiotic use.  Past Medical History:  Diagnosis Date  . Anxiety   . Migraines   . Sciatica   . UTI (urinary tract infection)     There are no active problems to display for this patient.   History reviewed. No pertinent surgical history.   OB History   None      Home Medications    Prior to Admission medications   Medication Sig Start Date End Date Taking? Authorizing Provider  ALPRAZolam Prudy Feeler) 1 MG tablet Take 1 mg by mouth at bedtime as needed for anxiety.     [provider]  diclofenac (VOLTAREN) 50 MG EC tablet Take 1 tablet (50 mg total) by mouth 2 (two) times daily. 01/02/18   Janne Napoleon, NP  lidocaine (XYLOCAINE) 2 % solution Use as directed 20 mLs in the mouth  or throat as needed for mouth pain. 09/15/17   Rise Mu, PA-C  methocarbamol (ROBAXIN) 500 MG tablet Take 1 tablet (500 mg total) by mouth 2 (two) times daily. 01/02/18   Janne Napoleon, NP  phenylephrine-shark liver oil-mineral oil-petrolatum (PREPARATION H) 0.25-3-14-71.9 % rectal ointment Place 1 application rectally 2 (two) times daily as needed for hemorrhoids.    [provider]  potassium chloride (K-DUR) 10 MEQ tablet Take 1 tablet (10 mEq total) by mouth daily. 04/25/17   Eber Hong, MD  pramoxine (PROCTOFOAM) 1 % foam Place 1 application rectally 3 (three) times daily as needed for anal itching. 04/25/17   Eber Hong, MD  promethazine (PHENERGAN) 25 MG tablet Take 1 tablet (25 mg total) by mouth every 6 (six) hours as needed for nausea or vomiting. 09/15/17   Rise Mu, PA-C    Family History No family history on file.  Social History Social History   Tobacco Use  . Smoking status: Current Every Day Smoker    Packs/day: 0.15    Types: Cigarettes  . Smokeless tobacco: Never Used  Substance Use Topics  . Alcohol use: Yes    Comment: rare  . Drug use: No     Allergies   Other; Percocet [oxycodone-acetaminophen]; and Zofran [ondansetron hcl]  Review of Systems Review of Systems  All other systems reviewed and are negative.    Physical Exam Updated Vital Signs BP 125/88 (BP Location: Left Arm)   Pulse 89   Temp 97.6 F (36.4 C) (Oral)   Resp 19   SpO2 98%   Physical Exam  Nursing note and vitals reviewed.  52 year old female, resting comfortably and in no acute distress. Vital signs are normal. Oxygen saturation is 98%, which is normal. Head is normocephalic and atraumatic. PERRLA, EOMI. Oropharynx is clear. Neck is nontender and supple without adenopathy or JVD. Back is nontender and there is no CVA tenderness. Lungs are clear without rales, wheezes, or rhonchi. Chest is nontender. Heart has regular rate and rhythm without  murmur. Abdomen is soft, flat, nontender without masses or hepatosplenomegaly and peristalsis is hypoactive. Extremities have no cyanosis or edema, full range of motion is present. Skin is warm and dry without rash. Neurologic: Mental status is normal, cranial nerves are intact, there are no motor or sensory deficits.  ED Treatments / Results  Labs (all labs ordered are listed, but only abnormal results are displayed) Labs Reviewed  CBC - Abnormal; Notable for the following components:      Result Value   RBC 5.29 (*)    Hemoglobin 15.8 (*)    HCT 47.3 (*)    All other components within normal limits  GASTROINTESTINAL PANEL BY PCR, STOOL (REPLACES STOOL CULTURE)  C DIFFICILE QUICK SCREEN W PCR REFLEX  LIPASE, BLOOD  COMPREHENSIVE METABOLIC PANEL  URINALYSIS, ROUTINE W REFLEX MICROSCOPIC  I-STAT BETA HCG BLOOD, ED ([facility], [facility], [facility] ONLY)   Radiology Ct Abdomen Pelvis W Contrast  Result Date: 02/07/2018 CLINICAL DATA:  Pt reports for 3 weeks had constant diarrhea and lower abd pains. Reports 7-10 times a day. denies any recent infection, antibiotics use, or being around anyone sick. EXAM: CT ABDOMEN AND PELVIS WITH CONTRAST TECHNIQUE: Multidetector CT imaging of the abdomen and pelvis was performed using the standard protocol following bolus administration of intravenous contrast. CONTRAST:  100mL ISOVUE-300 IOPAMIDOL (ISOVUE-300) INJECTION 61% COMPARISON:  12/08/2016 FINDINGS: Lower chest: No acute abnormality. Hepatobiliary: No focal liver abnormality is seen. No gallstones, gallbladder wall thickening, or biliary dilatation. Pancreas: Unremarkable. No pancreatic ductal dilatation or surrounding inflammatory changes. Spleen: Normal in size without focal abnormality. Adrenals/Urinary Tract: Adrenal glands are unremarkable. Kidneys are normal, without renal calculi, focal lesion, or hydronephrosis. Bladder is unremarkable. Stomach/Bowel: Stomach and small bowel decompressed. Normal appendix.  Moderate amount of fecal material in the proximal colon, decompressed distally. No regional inflammatory/edematous change. Vascular/Lymphatic: No significant vascular findings are present. No enlarged abdominal or pelvic lymph nodes. Reproductive: Uterus and bilateral adnexa are unremarkable. Other: No ascites.  No free air. Musculoskeletal: No acute or significant osseous findings. IMPRESSION: 1. No acute findings. 2. Nondilated bowel with moderate proximal colonic fecal material. Electronically Signed   By: Corlis Leak  Hassell M.D.   On: 02/07/2018 19:47    Procedures Procedures   Medications Ordered in ED Medications  promethazine (PHENERGAN) injection 12.5 mg (has no administration in time range)  iopamidol (ISOVUE-300) 61 % injection (has no administration in time range)  dicyclomine (BENTYL) capsule 20 mg (has no administration in time range)  morphine 4 MG/ML injection 4 mg (4 mg Intravenous Given 02/07/18 1902)  iopamidol (ISOVUE-300) 61 % injection 100 mL (100 mLs Intravenous Contrast Given 02/07/18 1924)     Initial Impression / Assessment and Plan / ED Course  I have reviewed the triage  vital signs and the nursing notes.  Pertinent labs & imaging results that were available during my care of the patient were reviewed by me and considered in my medical decision making (see chart for details).  Diarrhea of uncertain cause.  Screening labs are unremarkable.  I am concerned about possibility of Crohn's disease.  Will send for CT of abdomen and pelvis.  Old records are reviewed, and she has no relevant past visits.  Prior CT scans of abdomen and pelvis have shown diverticulosis.  CT scan shows no acute process, no evidence of inflammation to suggest Crohn's disease.  Moderate stool burden without signs of diarrhea.  Patient is advised of these findings.  At this point, I feel that antispasmodics would be the most appropriate treatment.  She is discharged with prescription for dicyclomine, but is  also referred to gastroenterology for follow-up.  Final Clinical Impressions(s) / ED Diagnoses   Final diagnoses:  Abdominal cramping  Frequent bowel movements    ED Discharge Orders        Ordered    dicyclomine (BENTYL) 20 MG tablet  3 times daily before meals & bedtime     02/07/18 2129       Dione Booze, MD 02/07/18 2134

## 2018-04-22 ENCOUNTER — Telehealth (HOSPITAL_COMMUNITY): Payer: Self-pay

## 2018-04-22 NOTE — Telephone Encounter (Signed)
Tried to call it could not be completed °

## 2018-05-18 ENCOUNTER — Emergency Department (HOSPITAL_COMMUNITY)
Admission: EM | Admit: 2018-05-18 | Discharge: 2018-05-18 | Disposition: A | Discharge status: Home / Self Care | Payer: Self-pay | Attending: Emergency Medicine | Admitting: Emergency Medicine

## 2018-05-18 ENCOUNTER — Other Ambulatory Visit: Payer: Self-pay

## 2018-05-18 ENCOUNTER — Encounter (HOSPITAL_COMMUNITY): Payer: Self-pay | Admitting: Emergency Medicine

## 2018-05-18 DIAGNOSIS — F17210 Nicotine dependence, cigarettes, uncomplicated: Secondary | ICD-10-CM | POA: Insufficient documentation

## 2018-05-18 DIAGNOSIS — M62830 Muscle spasm of back: Secondary | ICD-10-CM | POA: Insufficient documentation

## 2018-05-18 DIAGNOSIS — M545 Low back pain, unspecified: Secondary | ICD-10-CM

## 2018-05-18 DIAGNOSIS — Z79899 Other long term (current) drug therapy: Secondary | ICD-10-CM | POA: Insufficient documentation

## 2018-05-18 MED ORDER — CYCLOBENZAPRINE HCL 10 MG PO TABS: 10.0000 mg | ORAL_TABLET | Freq: Two times a day (BID) | ORAL | 0 refills | Status: DC | PRN

## 2018-05-18 MED ORDER — IBUPROFEN 800 MG PO TABS
800.0000 mg | ORAL_TABLET | Freq: Once | ORAL | Status: AC
  Administered 2018-05-18: 800 mg via ORAL
  Filled 2018-05-18: qty 1

## 2018-05-18 MED ORDER — CYCLOBENZAPRINE HCL 10 MG PO TABS
10.0000 mg | ORAL_TABLET | Freq: Once | ORAL | Status: AC
Start: 2018-05-18 — End: 2018-05-18
  Administered 2018-05-18: 10 mg via ORAL
  Filled 2018-05-18: qty 1

## 2018-05-18 NOTE — ED Triage Notes (Signed)
Pt arriving with lower back pain that began yesterday. Pt has hx of sciatica.

## 2018-05-18 NOTE — ED Provider Notes (Signed)
[facility]-EMERGENCY DEPT Provider Note  CSN: [id number] Arrival date & time: 05/18/18  2009    History   Chief Complaint Chief Complaint  Patient presents with  . Back Pain    HPI Kerry Young is a 52 y.o. female with a medical history of sciatica who presented to the ED for  Back Pain   This is a recurrent problem. The current episode started yesterday. The problem occurs constantly. The problem has not changed since onset.The pain is associated with no known injury. The pain is present in the lumbar spine (bilateral). The quality of the pain is described as shooting and aching. The pain does not radiate. The pain is at a severity of 8/10. The pain is severe. Exacerbated by: leg extension and walking. The pain is the same all the time. Pertinent negatives include no fever, no numbness, no bowel incontinence, no perianal numbness, no bladder incontinence, no leg pain, no paresthesias, no paresis, no tingling and no weakness. Treatments tried: Tylenol. Risk factors include obesity, lack of exercise and a sedentary lifestyle.    Past Medical History:  Diagnosis Date  . Anxiety   . Migraines   . Sciatica   . UTI (urinary tract infection)     There are no active problems to display for this patient.   History reviewed. No pertinent surgical history.   OB History   None      Home Medications    Prior to Admission medications   Medication Sig Start Date End Date Taking? Authorizing Provider  cyclobenzaprine (FLEXERIL) 10 MG tablet Take 1 tablet (10 mg total) by mouth 2 (two) times daily as needed for muscle spasms. 05/18/18   Mortis, Jerrel Ivory I, PA-C  dicyclomine (BENTYL) 20 MG tablet Take 1 tablet (20 mg total) by mouth 4 (four) times daily -  before meals and at bedtime. 02/07/18   Dione Booze, MD  diphenhydrAMINE (BENADRYL) 25 MG tablet Take 25 mg by mouth 3 (three) times daily as needed for allergies.    [provider]  ibuprofen  (ADVIL,MOTRIN) 200 MG tablet Take 600 mg by mouth every 8 (eight) hours as needed for headache.    [provider]    Family History No family history on file.  Social History Social History   Tobacco Use  . Smoking status: Current Every Day Smoker    Packs/day: 0.15    Types: Cigarettes  . Smokeless tobacco: Never Used  Substance Use Topics  . Alcohol use: Yes    Comment: rare  . Drug use: No     Allergies   Other; Percocet [oxycodone-acetaminophen]; and Zofran [ondansetron hcl]   Review of Systems Review of Systems  Constitutional: Negative for fever.  Gastrointestinal: Negative.  Negative for bowel incontinence.  Genitourinary: Negative.  Negative for bladder incontinence.  Musculoskeletal: Positive for back pain. Negative for arthralgias, gait problem and neck pain.  Neurological: Negative for tingling, weakness, numbness and paresthesias.  All other systems reviewed and are negative.    Physical Exam Updated Vital Signs BP (!) 128/95 (BP Location: Left Arm)   Pulse (!) 110   Resp 14   Ht 5\' 2"  (1.575 m)   Wt 81.6 kg   SpO2 96%   BMI 32.92 kg/m   Physical Exam  Constitutional:  Laying on the bed supine with knees bent for comfort  Musculoskeletal:  Full ROM in lower extremities bilaterally with 5/5 strength. Bilateral paraspinal lumbar muscle tenderness. No midline or bony tenderness. Lumbar pain  with bilateral leg extension. Negative straight leg raise.  Neurological: She has normal strength. No sensory deficit. Gait normal.  Reflex Scores:      Patellar reflexes are 2+ on the right side and 2+ on the left side.      Achilles reflexes are 2+ on the right side and 2+ on the left side. Skin: Skin is warm and intact. Capillary refill takes less than 2 seconds.  Nursing note and vitals reviewed.  ED Treatments / Results  Labs (all labs ordered are listed, but only abnormal results are displayed) Labs Reviewed - No data to  display  EKG None  Radiology No results found.  Procedures Procedures (including critical care time)  Medications Ordered in ED Medications  cyclobenzaprine (FLEXERIL) tablet 10 mg (has no administration in time range)  ibuprofen (ADVIL,MOTRIN) tablet 800 mg (has no administration in time range)     Initial Impression / Assessment and Plan / ED Course  Triage vital signs and the nursing notes have been reviewed.  Pertinent labs & imaging results that were available during care of the patient were reviewed and considered in medical decision making (see chart for details).   Patient presents well appearing and in no acute distress. She is able to ambulate on her own and does so without assistance or issue. Patient has history of past back pain, but denies preceding injury, unusual movements or excessive lifting prior to this acute episode. There have been no new or severe traumas, falls or injuries that would warrant back imaging today and no midline tenderness. Physical exam is reassuring. With the exception of muscular tenderness, her MSK and neuro exam is normal. There is no midline tenderness, deformities or abnormal neuro findings on exam or in history to suggest an acute spinal cord or osseus pathology. No systemic s/s to suggest underlying infectious or rheumatologic etiology.  Final Clinical Impressions(s) / ED Diagnoses  1. Low Back Pain. Rx for Flexeril prescribed for muscle spasms. Education provided on OTC and supportive treatment for pain relief and inflammation. Advised to follow-up with PCP if pain persists > 4-6 weeks.  Dispo: Home. After thorough clinical evaluation, this patient is determined to be medically stable and can be safely discharged with the previously mentioned treatment and/or outpatient follow-up/referral(s). At this time, there are no other apparent medical conditions that require further screening, evaluation or treatment.   Final diagnoses:  Acute  bilateral low back pain without sciatica    ED Discharge Orders         Ordered    cyclobenzaprine (FLEXERIL) 10 MG tablet  2 times daily PRN     05/18/18 2159            Reva Bores 05/18/18 2206    Pricilla Loveless, MD 05/19/18 [phone]

## 2018-05-18 NOTE — Discharge Instructions (Addendum)
I have written you a prescription for Flexeril which is a muscle relaxer. This should help for the muscle tightness you have. You may use Tylenol and/or Ibuprofen/Naproxen for pain relief and swelling. You may also use warm or cold compresses for additional relief.   You may follow-up with your PCP if you continue to have issues for more than 4-6 weeks.  Thank you for allowing [place] to take care of you today.

## 2018-05-19 ENCOUNTER — Telehealth (HOSPITAL_COMMUNITY): Payer: Self-pay | Admitting: Obstetrics and Gynecology

## 2018-05-19 NOTE — Telephone Encounter (Signed)
Called patient to schedule screening mammogram.  Could not leave message. Went strait to [employer] and voice mail box was not set up.

## 2018-07-28 ENCOUNTER — Other Ambulatory Visit: Payer: Self-pay | Admitting: Sports Medicine

## 2018-07-28 DIAGNOSIS — M545 Low back pain, unspecified: Secondary | ICD-10-CM

## 2018-08-05 ENCOUNTER — Ambulatory Visit: Payer: Self-pay | Admitting: Psychiatry

## 2018-08-09 ENCOUNTER — Other Ambulatory Visit: Payer: Self-pay | Admitting: Sports Medicine

## 2018-08-12 ENCOUNTER — Other Ambulatory Visit: Payer: Self-pay

## 2018-09-23 DIAGNOSIS — M5416 Radiculopathy, lumbar region: Secondary | ICD-10-CM | POA: Insufficient documentation

## 2018-10-17 ENCOUNTER — Other Ambulatory Visit: Payer: Self-pay

## 2018-10-17 ENCOUNTER — Encounter (HOSPITAL_COMMUNITY): Payer: Self-pay | Admitting: Emergency Medicine

## 2018-10-17 ENCOUNTER — Emergency Department (HOSPITAL_COMMUNITY)
Admission: EM | Admit: 2018-10-17 | Discharge: 2018-10-17 | Disposition: A | Discharge status: Home / Self Care | Payer: BLUE CROSS/BLUE SHIELD | Attending: Emergency Medicine | Admitting: Emergency Medicine

## 2018-10-17 DIAGNOSIS — K0889 Other specified disorders of teeth and supporting structures: Secondary | ICD-10-CM | POA: Diagnosis not present

## 2018-10-17 DIAGNOSIS — Z79899 Other long term (current) drug therapy: Secondary | ICD-10-CM | POA: Insufficient documentation

## 2018-10-17 DIAGNOSIS — F17210 Nicotine dependence, cigarettes, uncomplicated: Secondary | ICD-10-CM | POA: Insufficient documentation

## 2018-10-17 MED ORDER — OXYCODONE-ACETAMINOPHEN 5-325 MG PO TABS
1.0000 | ORAL_TABLET | Freq: Once | ORAL | Status: AC
  Administered 2018-10-17: 1 via ORAL
  Filled 2018-10-17: qty 1

## 2018-10-17 MED ORDER — PENICILLIN V POTASSIUM 500 MG PO TABS: 500.0000 mg | ORAL_TABLET | Freq: Three times a day (TID) | ORAL | 0 refills | Status: DC

## 2018-10-17 MED ORDER — NAPROXEN 250 MG PO TABS
500.0000 mg | ORAL_TABLET | Freq: Once | ORAL | Status: AC
  Administered 2018-10-17: 500 mg via ORAL
  Filled 2018-10-17: qty 2

## 2018-10-17 MED ORDER — NAPROXEN 500 MG PO TABS: 500.0000 mg | ORAL_TABLET | Freq: Two times a day (BID) | ORAL | 0 refills | Status: DC

## 2018-10-17 NOTE — Discharge Instructions (Signed)
Please read and follow all provided instructions.  Your diagnoses today include:  1. Pain, dental     The exam and treatment you received today has been provided on an emergency basis only. This is not a substitute for complete medical or dental care.  Tests performed today include:  Vital signs. See below for your results today.   Medications prescribed:   Naproxen - anti-inflammatory pain medication  Do not exceed 500mg  naproxen every 12 hours, take with food  You have been prescribed an anti-inflammatory medication or NSAID. Take with food. Take smallest effective dose for the shortest duration needed for your pain. Stop taking if you experience stomach pain or vomiting.    Penicillin - antibiotic  You have been prescribed an antibiotic medicine: take the entire course of medicine even if you are feeling better. Stopping early can cause the antibiotic not to work.  Take any prescribed medications only as directed.  Home care instructions:  Follow any educational materials contained in this packet.  Follow-up instructions: Please follow-up with your dentist for further evaluation of your symptoms.   Dental Assistance: See attached dental referrals if needed  Return instructions:   Please return to the Emergency Department if you experience worsening symptoms.  Please return if you develop a fever, you develop more swelling in your face or neck, you have trouble breathing or swallowing food.  Please return if you have any other emergent concerns.  Additional Information:  Your vital signs today were: BP (!) 136/106 (BP Location: Right Arm)    Pulse 81    Temp 98.7 F (37.1 C) (Oral)    Resp 18    Ht 5\' 2"  (1.575 m)    Wt 81.6 kg    SpO2 98%    BMI 32.92 kg/m  If your blood pressure (BP) was elevated above 135/85 this visit, please have this repeated by your doctor within one month. --------------

## 2018-10-17 NOTE — ED Triage Notes (Signed)
Reports pain in left lower side of mouth from bad tooth.  Had same issue in the past but not for a while.  Taking tylenol and using orajel at home.

## 2018-10-17 NOTE — ED Provider Notes (Signed)
[facility] EMERGENCY DEPARTMENT Provider Note   CSN: [id number] Arrival date & time: 10/17/18  2440    History   Chief Complaint Chief Complaint  Patient presents with  . Dental Pain    HPI Kerry Young is a 53 y.o. female.     Patient presents the emergency department with chief complaint of dental pain.  Patient reports left wisdom tooth pain starting last night.  She reports radiation to the left cheek and ear.  She has tried Tylenol and Orajel without improvement.  No facial swelling, neck swelling, difficulty breathing or swallowing.  No fevers or drainage from the ear.  Onset of symptoms acute.  Course is constant.     Past Medical History:  Diagnosis Date  . Anxiety   . Migraines   . Sciatica   . UTI (urinary tract infection)     There are no active problems to display for this patient.   History reviewed. No pertinent surgical history.   OB History   No obstetric history on file.      Home Medications    Prior to Admission medications   Medication Sig Start Date End Date Taking? Authorizing Provider  cyclobenzaprine (FLEXERIL) 10 MG tablet Take 1 tablet (10 mg total) by mouth 2 (two) times daily as needed for muscle spasms. 05/18/18   Mortis, Jerrel Ivory I, PA-C  dicyclomine (BENTYL) 20 MG tablet Take 1 tablet (20 mg total) by mouth 4 (four) times daily -  before meals and at bedtime. 02/07/18   Dione Booze, MD  diphenhydrAMINE (BENADRYL) 25 MG tablet Take 25 mg by mouth 3 (three) times daily as needed for allergies.    [provider]  ibuprofen (ADVIL,MOTRIN) 200 MG tablet Take 600 mg by mouth every 8 (eight) hours as needed for headache.    [provider]  naproxen (NAPROSYN) 500 MG tablet Take 1 tablet (500 mg total) by mouth 2 (two) times daily. 10/17/18   Renne Crigler, PA-C  penicillin v potassium (VEETID) 500 MG tablet Take 1 tablet (500 mg total) by mouth 3 (three) times daily. 10/17/18   Renne Crigler, PA-C     Family History No family history on file.  Social History Social History   Tobacco Use  . Smoking status: Current Every Day Smoker    Packs/day: 0.15    Types: Cigarettes  . Smokeless tobacco: Never Used  Substance Use Topics  . Alcohol use: Yes    Comment: rare  . Drug use: No     Allergies   Other; Percocet [oxycodone-acetaminophen]; and Zofran [ondansetron hcl]   Review of Systems Review of Systems  Constitutional: Negative for fever.  HENT: Positive for dental problem and ear pain. Negative for facial swelling, sore throat and trouble swallowing.   Respiratory: Negative for shortness of breath and stridor.   Musculoskeletal: Negative for neck pain.  Skin: Negative for color change.  Neurological: Negative for headaches.     Physical Exam Updated Vital Signs BP (!) 136/106 (BP Location: Right Arm)   Pulse 81   Temp 98.7 F (37.1 C) (Oral)   Resp 18   Ht 5\' 2"  (1.575 m)   Wt 81.6 kg   SpO2 98%   BMI 32.92 kg/m   Physical Exam Vitals signs and nursing note reviewed.  Constitutional:      General: She is in acute distress (tearful).     Appearance: She is well-developed.  HENT:     Head: Normocephalic and atraumatic.  Jaw: No trismus.     Left Ear: Tympanic membrane, ear canal and external ear normal.     Nose: Nose normal.     Mouth/Throat:     Dentition: Abnormal dentition. Dental caries present. No dental abscesses.     Pharynx: Uvula midline. No uvula swelling.     Tonsils: No tonsillar abscesses.     Comments: Poor dentition noted.  Tooth #17 is broken to the gumline.  No surrounding erythema or abscess.  Area is tender to palpation. Eyes:     Conjunctiva/sclera: Conjunctivae normal.  Neck:     Musculoskeletal: Normal range of motion and neck supple.     Comments: No neck swelling or Ludwig's angina Lymphadenopathy:     Cervical: No cervical adenopathy.  Skin:    General: Skin is warm and dry.  Neurological:     Mental Status: She is  alert.      ED Treatments / Results  Labs (all labs ordered are listed, but only abnormal results are displayed) Labs Reviewed - No data to display  EKG None  Radiology No results found.  Procedures Procedures (including critical care time)  Medications Ordered in ED Medications  oxyCODONE-acetaminophen (PERCOCET/ROXICET) 5-325 MG per tablet 1 tablet (has no administration in time range)  naproxen (NAPROSYN) tablet 500 mg (has no administration in time range)     Initial Impression / Assessment and Plan / ED Course  I have reviewed the triage vital signs and the nursing notes.  Pertinent labs & imaging results that were available during my care of the patient were reviewed by me and considered in my medical decision making (see chart for details).        6:42 AM Patient seen and examined. Medications ordered.   Vital signs reviewed and are as follows: BP (!) 136/106 (BP Location: Right Arm)   Pulse 81   Temp 98.7 F (37.1 C) (Oral)   Resp 18   Ht 5\' 2"  (1.575 m)   Wt 81.6 kg   SpO2 98%   BMI 32.92 kg/m   Patient states that she has a safe ride home today with her boyfriend and will be driving.  Will give 1 dose of Percocet and naproxen here.  Patient will be given prescription for naproxen and penicillin for home.  Patient requesting narcotic pain medication for home.  I politely declined explaining that narcotic pain medications are not indicated for long-term use for uncomplicated dental pain.  Encouraged follow-up with her dentist and referrals given.  Patient counseled to take prescribed medications as directed, return with worsening facial or neck swelling, and to follow-up with their dentist as soon as possible.    Final Clinical Impressions(s) / ED Diagnoses   Final diagnoses:  Pain, dental   Patient with toothache. No fever. Exam unconcerning for Ludwig's angina or other deep tissue infection in neck.    ED Discharge Orders         Ordered     naproxen (NAPROSYN) 500 MG tablet  2 times daily     10/17/18 0636    penicillin v potassium (VEETID) 500 MG tablet  3 times daily     10/17/18 0636           Renne Crigler, PA-C 10/17/18 9242    Dione Booze, MD 10/17/18 [phone]

## 2018-10-19 DIAGNOSIS — M48061 Spinal stenosis, lumbar region without neurogenic claudication: Secondary | ICD-10-CM | POA: Insufficient documentation

## 2018-10-19 DIAGNOSIS — G894 Chronic pain syndrome: Secondary | ICD-10-CM | POA: Insufficient documentation

## 2018-11-04 DIAGNOSIS — M47816 Spondylosis without myelopathy or radiculopathy, lumbar region: Secondary | ICD-10-CM | POA: Insufficient documentation

## 2018-11-04 DIAGNOSIS — M545 Low back pain, unspecified: Secondary | ICD-10-CM | POA: Insufficient documentation

## 2019-01-10 DIAGNOSIS — M48062 Spinal stenosis, lumbar region with neurogenic claudication: Secondary | ICD-10-CM | POA: Insufficient documentation

## 2019-01-16 DIAGNOSIS — F419 Anxiety disorder, unspecified: Secondary | ICD-10-CM | POA: Insufficient documentation

## 2019-01-16 DIAGNOSIS — F410 Panic disorder [episodic paroxysmal anxiety] without agoraphobia: Secondary | ICD-10-CM | POA: Insufficient documentation

## 2019-01-19 ENCOUNTER — Encounter (HOSPITAL_COMMUNITY): Payer: Self-pay | Admitting: Emergency Medicine

## 2019-01-19 ENCOUNTER — Emergency Department (HOSPITAL_COMMUNITY)
Admission: EM | Admit: 2019-01-19 | Discharge: 2019-01-19 | Disposition: A | Discharge status: Home / Self Care | Payer: BLUE CROSS/BLUE SHIELD | Attending: Emergency Medicine | Admitting: Emergency Medicine

## 2019-01-19 ENCOUNTER — Other Ambulatory Visit: Payer: Self-pay

## 2019-01-19 DIAGNOSIS — Z79899 Other long term (current) drug therapy: Secondary | ICD-10-CM | POA: Diagnosis not present

## 2019-01-19 DIAGNOSIS — M545 Low back pain: Secondary | ICD-10-CM | POA: Diagnosis present

## 2019-01-19 DIAGNOSIS — M5440 Lumbago with sciatica, unspecified side: Secondary | ICD-10-CM | POA: Diagnosis not present

## 2019-01-19 DIAGNOSIS — F17210 Nicotine dependence, cigarettes, uncomplicated: Secondary | ICD-10-CM | POA: Diagnosis not present

## 2019-01-19 DIAGNOSIS — G8929 Other chronic pain: Secondary | ICD-10-CM | POA: Insufficient documentation

## 2019-01-19 MED ORDER — ACETAMINOPHEN 500 MG PO TABS
1000.0000 mg | ORAL_TABLET | Freq: Once | ORAL | Status: AC
  Administered 2019-01-19: 1000 mg via ORAL
  Filled 2019-01-19: qty 2

## 2019-01-19 MED ORDER — DIAZEPAM 5 MG PO TABS
5.0000 mg | ORAL_TABLET | Freq: Once | ORAL | Status: AC
  Administered 2019-01-19: 5 mg via ORAL
  Filled 2019-01-19: qty 1

## 2019-01-19 MED ORDER — KETOROLAC TROMETHAMINE 15 MG/ML IJ SOLN
15.0000 mg | Freq: Once | INTRAMUSCULAR | Status: AC
Start: 2019-01-19 — End: 2019-01-19
  Administered 2019-01-19: 15 mg via INTRAMUSCULAR
  Filled 2019-01-19: qty 1

## 2019-01-19 MED ORDER — OXYCODONE-ACETAMINOPHEN 5-325 MG PO TABS
1.0000 | ORAL_TABLET | Freq: Once | ORAL | Status: AC
  Administered 2019-01-19: 1 via ORAL
  Filled 2019-01-19: qty 1

## 2019-01-19 MED ORDER — HYDROCODONE-ACETAMINOPHEN 5-325 MG PO TABS: 1.0000 | ORAL_TABLET | Freq: Four times a day (QID) | ORAL | 0 refills | Status: AC | PRN

## 2019-01-19 NOTE — ED Provider Notes (Signed)
[facility]-EMERGENCY DEPT Provider Note   CSN: [id number] Arrival date & time: 01/19/19  0556    History   Chief Complaint Chief Complaint  Patient presents with  . Back Pain  . Leg Pain    HPI Kerry Young is a 53 y.o. female.     53 y.o female with a PMH of Sciatica, Migraines, Lumbar spinal stenosis presents to the ED with a chief complaint of chronic back pain. Patient reports she's had this pain for the past two months. She reports sharp sensation to the lumbar spine radiating down her right leg and going down to her foot. Patient has an appointment schedule with her neurosurgeon on Monday but states she has not been able to get relieve.  Patient reports the pain is worse when sitting up, worse with walking.  She reports she has had several episodes of weakness to her right leg due to the pain.  She denies any urinary tension, bowel incontinence, fever, IV drug use or previous history of cancer.\ Of note, patient is currently under pain management according to her records, was given a prescription for Lyrica which she reports he stopped taking as this messed with her heart somehow.  The history is provided by the patient.    Past Medical History:  Diagnosis Date  . Anxiety   . Migraines   . Sciatica   . UTI (urinary tract infection)     There are no active problems to display for this patient.   History reviewed. No pertinent surgical history.   OB History   No obstetric history on file.      Home Medications    Prior to Admission medications   Medication Sig Start Date End Date Taking? Authorizing Provider  cyclobenzaprine (FLEXERIL) 10 MG tablet Take 1 tablet (10 mg total) by mouth 2 (two) times daily as needed for muscle spasms. 05/18/18   Mortis, Jerrel IvoryGabrielle I, PA-C  dicyclomine (BENTYL) 20 MG tablet Take 1 tablet (20 mg total) by mouth 4 (four) times daily -  before meals and at bedtime. 02/07/18   Dione BoozeGlick, David, MD  diphenhydrAMINE  (BENADRYL) 25 MG tablet Take 25 mg by mouth 3 (three) times daily as needed for allergies.    [provider]  HYDROcodone-acetaminophen (NORCO/VICODIN) 5-325 MG tablet Take 1 tablet by mouth every 6 (six) hours as needed for up to 3 days for severe pain. 01/19/19 01/22/19  Claude MangesSoto, Jupiter Boys, PA-C  ibuprofen (ADVIL,MOTRIN) 200 MG tablet Take 600 mg by mouth every 8 (eight) hours as needed for headache.    [provider]  naproxen (NAPROSYN) 500 MG tablet Take 1 tablet (500 mg total) by mouth 2 (two) times daily. 10/17/18   Renne CriglerGeiple, Joshua, PA-C  penicillin v potassium (VEETID) 500 MG tablet Take 1 tablet (500 mg total) by mouth 3 (three) times daily. 10/17/18   Renne CriglerGeiple, Joshua, PA-C    Family History History reviewed. No pertinent family history.  Social History Social History   Tobacco Use  . Smoking status: Current Every Day Smoker    Packs/day: 0.15    Types: Cigarettes  . Smokeless tobacco: Never Used  Substance Use Topics  . Alcohol use: Yes    Comment: rare  . Drug use: No     Allergies   Ondansetron hcl, Other, Oxycodone-acetaminophen, and Percocet [oxycodone-acetaminophen]   Review of Systems Review of Systems  Constitutional: Negative for chills and fever.  HENT: Negative for ear pain and sore throat.   Eyes: Negative  for pain and visual disturbance.  Respiratory: Negative for cough and shortness of breath.   Cardiovascular: Negative for chest pain and palpitations.  Gastrointestinal: Negative for abdominal pain and vomiting.  Genitourinary: Negative for dysuria and hematuria.  Musculoskeletal: Positive for back pain and myalgias. Negative for arthralgias.  Skin: Negative for color change and rash.  Neurological: Negative for seizures and syncope.  All other systems reviewed and are negative.    Physical Exam Updated Vital Signs BP 127/85 (BP Location: Right Arm)   Pulse 92   Temp 98.3 F (36.8 C) (Oral)   Resp 18   Ht 5\' 2"  (1.575 m)   Wt 86.2 kg    SpO2 96%   BMI 34.75 kg/m   Physical Exam Vitals signs and nursing note reviewed.  Constitutional:      General: She is not in acute distress.    Appearance: She is well-developed.  HENT:     Head: Normocephalic and atraumatic.     Mouth/Throat:     Pharynx: No oropharyngeal exudate.  Eyes:     Pupils: Pupils are equal, round, and reactive to light.  Neck:     Musculoskeletal: Normal range of motion.  Cardiovascular:     Rate and Rhythm: Regular rhythm.     Heart sounds: Normal heart sounds.  Pulmonary:     Effort: Pulmonary effort is normal. No respiratory distress.     Breath sounds: Normal breath sounds.  Abdominal:     General: Bowel sounds are normal. There is no distension.     Palpations: Abdomen is soft.     Tenderness: There is no abdominal tenderness.  Musculoskeletal:        General: No deformity.     Lumbar back: She exhibits tenderness, pain and spasm. She exhibits no edema, no laceration and normal pulse.       Back:     Right lower leg: No edema.     Left lower leg: No edema.  Skin:    General: Skin is warm and dry.  Neurological:     Mental Status: She is alert and oriented to person, place, and time.      ED Treatments / Results  Labs (all labs ordered are listed, but only abnormal results are displayed) Labs Reviewed - No data to display  EKG None  Radiology No results found.  Procedures Procedures (including critical care time)  Medications Ordered in ED Medications  oxyCODONE-acetaminophen (PERCOCET/ROXICET) 5-325 MG per tablet 1 tablet (1 tablet Oral Given 01/19/19 0748)  diazepam (VALIUM) tablet 5 mg (5 mg Oral Given 01/19/19 0748)  acetaminophen (TYLENOL) tablet 1,000 mg (1,000 mg Oral Given 01/19/19 0748)  ketorolac (TORADOL) 15 MG/ML injection 15 mg (15 mg Intramuscular Given 01/19/19 0748)     Initial Impression / Assessment and Plan / ED Course  I have reviewed the triage vital signs and the nursing notes.  Pertinent labs &  imaging results that were available during my care of the patient were reviewed by me and considered in my medical decision making (see chart for details).    Patient with a past medical history of spinal stenosis of the lumbar spine presents to the ED with a chief complaint of lumbar pain which feels like her recent sciatica.  Patient was being managed by pain management, reports they placed her on multiple muscle relaxers which did not work, they had also place her on Lyrica but this did not work with her heart, this medication was discontinued per patient.  I have extensively review her records, she states she has an appointment with a neurosurgeon on Monday and would need med patient to get to appointment.  According to patient she had a prior MRI done by her surgeon, she will be discussing this with him on Monday.  Patient is adamant that she needs a short course of medication in order to get her to Monday.  I have personally reviewed the [place] substance controlled database, on June 11 of 2020 patient was prescribed hydrocodone 15 tablets, she reports completing this therapy.  On June 22 patient was prescribed Lyrica 60 of these, she reports he is currently not taking these as it did not work well with her heart.  Patient is neuro vascularly intact, was ambulatory in the ED.  She received pain medications such as Roxie, Valium, Tylenol, Toradol to help with her symptoms.  8:24 AM patient was reassessed reporting improvement in symptoms, she does have a plan in place.  Shared decision conversation with patient about short course of opioids.  Patient reports she is unable to tolerate the pain and keep this under control and Tylenol.  We will provide her with a short course of Norco until her appointment on Monday.  Patient also voiced no longer being a part of pain clinic.  No red flags such as previous history of cancer, fevers, IV drug use, urinary retention or bowel incontinence.  Low suspicion  for any cauda equina or other acute process.  Will have patient follow-up with neurosurgery on Monday.  Return if symptoms worsen or she experience any of the red flags listed above.  Return precautions discussed at length.   Portions of this note were generated with [employer]. Dictation errors may occur despite best attempts at proofreading.  Final Clinical Impressions(s) / ED Diagnoses   Final diagnoses:  Chronic right-sided low back pain with sciatica, sciatica laterality unspecified    ED Discharge Orders         Ordered    HYDROcodone-acetaminophen (NORCO/VICODIN) 5-325 MG tablet  Every 6 hours PRN     01/19/19 0817           Janeece Fitting, PA-C 01/19/19 0826    Tegeler, Gwenyth Allegra, MD 01/19/19 1531

## 2019-01-19 NOTE — ED Triage Notes (Signed)
Patient states she has an appointment with her neurologist on Monday. Patient states that she is having right leg pain that is shooting. She says the pain is so bad that she is falling per patient.

## 2019-01-19 NOTE — ED Notes (Signed)
Bed: [facility] Expected date:  Expected time:  Means of arrival:  Comments: 

## 2019-01-19 NOTE — ED Notes (Signed)
Pt instructed to change into a hospital gown for further assessment from EDP

## 2019-01-19 NOTE — Discharge Instructions (Signed)
I have prescribed a short course of Norco to help with your pain. Please take this as needed for severe pain. Please follow up with your neurosurgeon at your Monday appointment for further management of your lower back pain. If you experience any urinary retention, bowel incontinence, fever please return to the emergency department.

## 2019-04-05 DIAGNOSIS — E6609 Other obesity due to excess calories: Secondary | ICD-10-CM | POA: Insufficient documentation

## 2019-05-05 ENCOUNTER — Encounter (HOSPITAL_COMMUNITY): Payer: Self-pay

## 2019-05-05 ENCOUNTER — Other Ambulatory Visit: Payer: Self-pay

## 2019-05-05 ENCOUNTER — Emergency Department (HOSPITAL_COMMUNITY)
Admission: EM | Admit: 2019-05-05 | Discharge: 2019-05-05 | Disposition: A | Discharge status: Home / Self Care | Payer: BLUE CROSS/BLUE SHIELD | Attending: Emergency Medicine | Admitting: Emergency Medicine

## 2019-05-05 DIAGNOSIS — G8929 Other chronic pain: Secondary | ICD-10-CM | POA: Insufficient documentation

## 2019-05-05 DIAGNOSIS — M545 Low back pain: Secondary | ICD-10-CM | POA: Diagnosis present

## 2019-05-05 DIAGNOSIS — F17210 Nicotine dependence, cigarettes, uncomplicated: Secondary | ICD-10-CM | POA: Diagnosis not present

## 2019-05-05 DIAGNOSIS — M5440 Lumbago with sciatica, unspecified side: Secondary | ICD-10-CM | POA: Insufficient documentation

## 2019-05-05 DIAGNOSIS — M5441 Lumbago with sciatica, right side: Secondary | ICD-10-CM

## 2019-05-05 MED ORDER — DIAZEPAM 5 MG PO TABS
5.0000 mg | ORAL_TABLET | Freq: Once | ORAL | Status: AC
  Administered 2019-05-05: 5 mg via ORAL
  Filled 2019-05-05: qty 1

## 2019-05-05 MED ORDER — HYDROCODONE-ACETAMINOPHEN 5-325 MG PO TABS
1.0000 | ORAL_TABLET | Freq: Once | ORAL | Status: AC
  Administered 2019-05-05: 1 via ORAL
  Filled 2019-05-05: qty 1

## 2019-05-05 MED ORDER — HYDROCODONE-ACETAMINOPHEN 5-325 MG PO TABS: 1.0000 | ORAL_TABLET | Freq: Four times a day (QID) | ORAL | 0 refills | Status: DC | PRN

## 2019-05-05 NOTE — ED Triage Notes (Signed)
Patient c/o right lower back pain that radiates down the right leg. 

## 2019-05-05 NOTE — Discharge Instructions (Signed)
It was my pleasure taking care of you today!   You have been seen in the Emergency Department today for back pain.   Norco only as needed for severe pain. In addition to this, use ice and/or heat for additional pain relief.  Follow up with your doctor as soon as possible.   Return to the ED for worsening back pain, fever, weakness or numbness of either leg, or if you develop either (1) an inability to urinate or have bowel movements, or (2) loss of your ability to control your bathroom functions (if you start having "accidents"), or if you develop other new symptoms that concern you.

## 2019-05-05 NOTE — ED Provider Notes (Signed)
[facility] DEPT Provider Note   CSN: [id number] Arrival date & time: 05/05/19  1119     History   Chief Complaint Chief Complaint  Patient presents with  . Back Pain    HPI Kerry Young is a 53 y.o. female.     The history is provided by the patient and medical records. No language interpreter was used.  Back Pain Associated symptoms: no abdominal pain, no chest pain, no dysuria, no fever, no numbness and no weakness    Kerry Young is a 53 y.o. female  with a PMH of low back pain / sciatica who presents to the Emergency Department complaining of acute worsening of chronic right sided low back pain which began yesterday.  History of similar flares in the past. Has been taking her home Lyrica with little improvement. Patient denies upper back or neck pain. No fever, saddle anesthesia, weakness, numbness, urinary complaints including retention/incontinence. No history of cancer, IVDU, or recent spinal procedures.  Past Medical History:  Diagnosis Date  . Anxiety   . Migraines   . Sciatica   . UTI (urinary tract infection)     There are no active problems to display for this patient.   Past Surgical History:  Procedure Laterality Date  . WISDOM TOOTH EXTRACTION       OB History   No obstetric history on file.      Home Medications    Prior to Admission medications   Medication Sig Start Date End Date Taking? Authorizing Provider  cyclobenzaprine (FLEXERIL) 10 MG tablet Take 1 tablet (10 mg total) by mouth 2 (two) times daily as needed for muscle spasms. 05/18/18   Mortis, Alvie Heidelberg I, PA-C  dicyclomine (BENTYL) 20 MG tablet Take 1 tablet (20 mg total) by mouth 4 (four) times daily -  before meals and at bedtime. 08/12/56   Delora Fuel, MD  diphenhydrAMINE (BENADRYL) 25 MG tablet Take 25 mg by mouth 3 (three) times daily as needed for allergies.    [provider]  HYDROcodone-acetaminophen (NORCO/VICODIN) 5-325 MG tablet  Take 1 tablet by mouth every 6 (six) hours as needed for severe pain. 05/05/19   , Ozella Almond, PA-C  ibuprofen (ADVIL,MOTRIN) 200 MG tablet Take 600 mg by mouth every 8 (eight) hours as needed for headache.    [provider]  naproxen (NAPROSYN) 500 MG tablet Take 1 tablet (500 mg total) by mouth 2 (two) times daily. 10/17/18   Carlisle Cater, PA-C  penicillin v potassium (VEETID) 500 MG tablet Take 1 tablet (500 mg total) by mouth 3 (three) times daily. 10/17/18   Carlisle Cater, PA-C    Family History History reviewed. No pertinent family history.  Social History Social History   Tobacco Use  . Smoking status: Current Every Day Smoker    Packs/day: 0.15    Types: Cigarettes  . Smokeless tobacco: Never Used  Substance Use Topics  . Alcohol use: Yes    Comment: rare  . Drug use: No     Allergies   Ondansetron hcl, Other, Oxycodone-acetaminophen, and Percocet [oxycodone-acetaminophen]   Review of Systems Review of Systems  Constitutional: Negative for fever.  Respiratory: Negative for shortness of breath.   Cardiovascular: Negative for chest pain.  Gastrointestinal: Negative for abdominal pain, nausea and vomiting.  Genitourinary: Negative for difficulty urinating, dysuria and vaginal discharge.  Musculoskeletal: Positive for back pain.  Skin: Negative for color change.  Neurological: Negative for weakness and numbness.  Physical Exam Updated Vital Signs BP 110/82 (BP Location: Left Arm)   Pulse 70   Temp 98.3 F (36.8 C) (Oral)   Resp 17   Ht 5\' 2"  (1.575 m)   Wt 86.2 kg   SpO2 97%   BMI 34.75 kg/m   Physical Exam Vitals signs and nursing note reviewed.  Constitutional:      Appearance: She is well-developed.  Neck:     Comments: No midline or paraspinal tenderness. Full ROM without pain. Cardiovascular:     Rate and Rhythm: Normal rate and regular rhythm.     Heart sounds: Normal heart sounds.  Pulmonary:     Effort: Pulmonary effort  is normal. No respiratory distress.     Breath sounds: Normal breath sounds.  Abdominal:     General: Bowel sounds are normal. There is no distension.     Palpations: Abdomen is soft.     Tenderness: There is no abdominal tenderness.     Comments: No abdominal tenderness.  Musculoskeletal:     Comments: No midline T/L tenderness. Tenderness to palpation of right lumbar paraspinal musculature and SI. 5/5 muscle strength in bilateral LE's. After pain medication, able to ambulate independently with steady gait.  Skin:    General: Skin is warm and dry.     Findings: No erythema or rash.  Neurological:     Mental Status: She is alert and oriented to person, place, and time.     Deep Tendon Reflexes: Reflexes are normal and symmetric.     Comments: Bilateral lower extremities neurovascularly intact.      ED Treatments / Results  Labs (all labs ordered are listed, but only abnormal results are displayed) Labs Reviewed - No data to display  EKG None  Radiology No results found.  Procedures Procedures (including critical care time)  Medications Ordered in ED Medications  diazepam (VALIUM) tablet 5 mg (5 mg Oral Given 05/05/19 1246)  HYDROcodone-acetaminophen (NORCO/VICODIN) 5-325 MG per tablet 1 tablet (1 tablet Oral Given 05/05/19 1246)     Initial Impression / Assessment and Plan / ED Course  I have reviewed the triage vital signs and the nursing notes.  Pertinent labs & imaging results that were available during my care of the patient were reviewed by me and considered in my medical decision making (see chart for details).       Kerry Young is a 53 y.o. female who presents to ED for acute worsening of her chronic right back pain. Patient demonstrates no lower extremity weakness, saddle anesthesia, bowel or bladder incontinence or neuro deficits. No concern for cauda equina. No fevers or other infectious symptoms to suggest that the patient's back pain is due to an  infection. Lower extremities are neurovascularly intact and patient is ambulating without difficulty on re-eval after pain control. I have reviewed return precautions, including the development of any of these signs or symptoms and the patient has voiced understanding. I reviewed symptomatic home care instructions and Follow-up with her doc strongly encouraged. Short course of pain medication provided until she can see her pain mgt doc on Monday. Patient voiced understanding and agreement with plan. All questions answered.  Final Clinical Impressions(s) / ED Diagnoses   Final diagnoses:  Right-sided low back pain with sciatica, sciatica laterality unspecified, unspecified chronicity    ED Discharge Orders         Ordered    HYDROcodone-acetaminophen (NORCO/VICODIN) 5-325 MG tablet  Every 6 hours PRN     05/05/19 1354  , Chase PicketJaime Pilcher, PA-C 05/05/19 1417    Terald Sleeperrifan, Matthew J, MD 05/05/19 [phone]

## 2019-07-01 ENCOUNTER — Other Ambulatory Visit: Payer: Self-pay

## 2019-07-01 ENCOUNTER — Emergency Department (HOSPITAL_COMMUNITY): Payer: BLUE CROSS/BLUE SHIELD

## 2019-07-01 ENCOUNTER — Encounter (HOSPITAL_COMMUNITY): Payer: Self-pay | Admitting: Emergency Medicine

## 2019-07-01 ENCOUNTER — Emergency Department (HOSPITAL_COMMUNITY)
Admission: EM | Admit: 2019-07-01 | Discharge: 2019-07-01 | Disposition: A | Discharge status: Home / Self Care | Payer: BLUE CROSS/BLUE SHIELD | Attending: Emergency Medicine | Admitting: Emergency Medicine

## 2019-07-01 DIAGNOSIS — R059 Cough, unspecified: Secondary | ICD-10-CM

## 2019-07-01 DIAGNOSIS — R109 Unspecified abdominal pain: Secondary | ICD-10-CM | POA: Diagnosis present

## 2019-07-01 DIAGNOSIS — E860 Dehydration: Secondary | ICD-10-CM | POA: Insufficient documentation

## 2019-07-01 DIAGNOSIS — R112 Nausea with vomiting, unspecified: Secondary | ICD-10-CM

## 2019-07-01 DIAGNOSIS — Z79899 Other long term (current) drug therapy: Secondary | ICD-10-CM | POA: Diagnosis not present

## 2019-07-01 DIAGNOSIS — R05 Cough: Secondary | ICD-10-CM | POA: Insufficient documentation

## 2019-07-01 DIAGNOSIS — Z20828 Contact with and (suspected) exposure to other viral communicable diseases: Secondary | ICD-10-CM | POA: Diagnosis not present

## 2019-07-01 DIAGNOSIS — R519 Headache, unspecified: Secondary | ICD-10-CM | POA: Diagnosis not present

## 2019-07-01 DIAGNOSIS — F17210 Nicotine dependence, cigarettes, uncomplicated: Secondary | ICD-10-CM | POA: Diagnosis not present

## 2019-07-01 DIAGNOSIS — B349 Viral infection, unspecified: Secondary | ICD-10-CM | POA: Insufficient documentation

## 2019-07-01 LAB — CBC
HCT: 49.5 % — ABNORMAL HIGH (ref 36.0–46.0)
Hemoglobin: 16.2 g/dL — ABNORMAL HIGH (ref 12.0–15.0)
MCH: 28.8 pg (ref 26.0–34.0)
MCHC: 32.7 g/dL (ref 30.0–36.0)
MCV: 88.1 fL (ref 80.0–100.0)
Platelets: 271 10*3/uL (ref 150–400)
RBC: 5.62 MIL/uL — ABNORMAL HIGH (ref 3.87–5.11)
RDW: 13.5 % (ref 11.5–15.5)
WBC: 8.2 10*3/uL (ref 4.0–10.5)
nRBC: 0 % (ref 0.0–0.2)

## 2019-07-01 LAB — URINALYSIS, ROUTINE W REFLEX MICROSCOPIC
Bacteria, UA: NONE SEEN
Bilirubin Urine: NEGATIVE
Glucose, UA: NEGATIVE mg/dL
Hgb urine dipstick: NEGATIVE
Ketones, ur: 5 mg/dL — AB
Nitrite: NEGATIVE
Protein, ur: 30 mg/dL — AB
Specific Gravity, Urine: 1.031 — ABNORMAL HIGH (ref 1.005–1.030)
pH: 5 (ref 5.0–8.0)

## 2019-07-01 LAB — COMPREHENSIVE METABOLIC PANEL
ALT: 37 U/L (ref 0–44)
AST: 33 U/L (ref 15–41)
Albumin: 4 g/dL (ref 3.5–5.0)
Alkaline Phosphatase: 84 U/L (ref 38–126)
Anion gap: 10 (ref 5–15)
BUN: 9 mg/dL (ref 6–20)
CO2: 19 mmol/L — ABNORMAL LOW (ref 22–32)
Calcium: 9 mg/dL (ref 8.9–10.3)
Chloride: 110 mmol/L (ref 98–111)
Creatinine, Ser: 0.81 mg/dL (ref 0.44–1.00)
GFR calc Af Amer: >60 mL/min (ref >60)
GFR calc non Af Amer: >60 mL/min (ref >60)
Glucose, Bld: 98 mg/dL (ref 70–99)
Potassium: 3.9 mmol/L (ref 3.5–5.1)
Sodium: 139 mmol/L (ref 135–145)
Total Bilirubin: 1.2 mg/dL (ref 0.3–1.2)
Total Protein: 7.5 g/dL (ref 6.5–8.1)

## 2019-07-01 LAB — LIPASE, BLOOD: Lipase: 123 U/L — ABNORMAL HIGH (ref 11–51)

## 2019-07-01 LAB — I-STAT BETA HCG BLOOD, ED (MC, WL, AP ONLY): I-stat hCG, quantitative: <5 m[IU]/mL (ref <5)

## 2019-07-01 LAB — POC SARS CORONAVIRUS 2 AG -  ED: SARS Coronavirus 2 Ag: NEGATIVE

## 2019-07-01 IMAGING — DX DG CHEST 1V PORT
1 series · 1 of 1 positions shown · non-contrast
Comparison: Chest radiograph [DATE]

CLINICAL DATA: C/o sharp abd pain, nausea, vomiting, diarrhea,
generalized body aches, chills, headache, cough, sore throat, and
runny nose x 2 days. Per triage notes [DATE]

EXAM:
PORTABLE CHEST 1 VIEW

[chest ap]
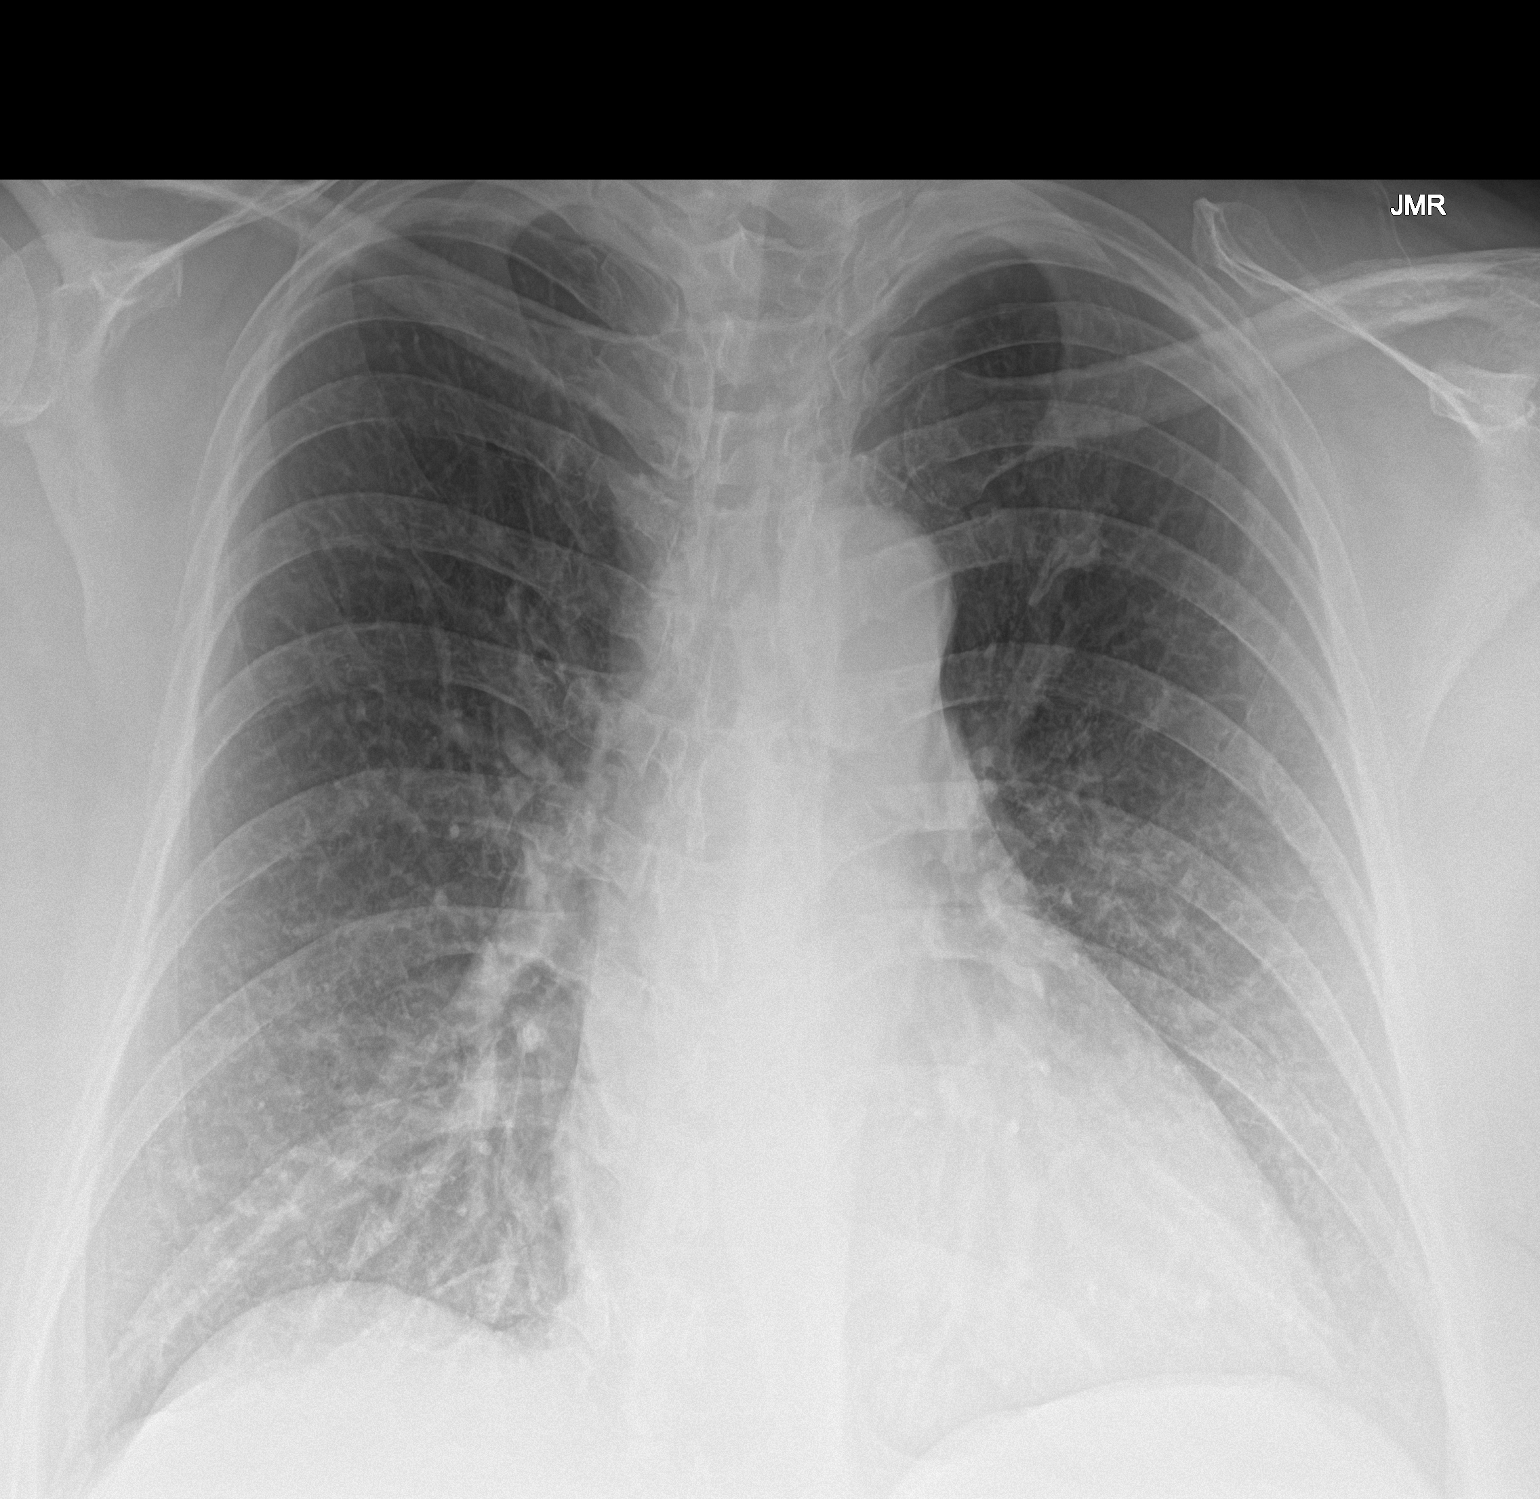

[1 of 1 positions shown; findings below may reference images not displayed]

FINDINGS: Mediastinal contours are within normal limits. Heart size is upper
limits of normal which may be secondary to AP technique. The lungs
are clear. Mildly prominent interstitial markings are stable from
prior. No pneumothorax or large pleural effusion. No acute finding
in the visualized skeleton.
IMPRESSION: No evidence of active disease in the chest.

## 2019-07-01 MED ORDER — BENZONATATE 100 MG PO CAPS: 100.0000 mg | ORAL_CAPSULE | Freq: Three times a day (TID) | ORAL | 0 refills | Status: DC

## 2019-07-01 MED ORDER — SODIUM CHLORIDE 0.9 % IV BOLUS
500.0000 mL | Freq: Once | INTRAVENOUS | Status: AC
  Administered 2019-07-01: 500 mL via INTRAVENOUS

## 2019-07-01 MED ORDER — ACETAMINOPHEN 325 MG PO TABS
650.0000 mg | ORAL_TABLET | Freq: Once | ORAL | Status: AC
  Administered 2019-07-01: 650 mg via ORAL
  Filled 2019-07-01: qty 2

## 2019-07-01 MED ORDER — PROMETHAZINE HCL 25 MG/ML IJ SOLN
12.5000 mg | Freq: Once | INTRAMUSCULAR | Status: AC
  Administered 2019-07-01: 12.5 mg via INTRAVENOUS
  Filled 2019-07-01: qty 1

## 2019-07-01 MED ORDER — SODIUM CHLORIDE 0.9% FLUSH: 3.0000 mL | Freq: Once | INTRAVENOUS | Status: DC

## 2019-07-01 MED ORDER — KETOROLAC TROMETHAMINE 30 MG/ML IJ SOLN
15.0000 mg | Freq: Once | INTRAMUSCULAR | Status: AC
  Administered 2019-07-01: 15 mg via INTRAVENOUS
  Filled 2019-07-01: qty 1

## 2019-07-01 MED ORDER — PROMETHAZINE HCL 12.5 MG PO TABS: 12.5000 mg | ORAL_TABLET | Freq: Four times a day (QID) | ORAL | 0 refills | Status: DC | PRN

## 2019-07-01 NOTE — ED Notes (Signed)
ED Provider at bedside. 

## 2019-07-01 NOTE — Discharge Instructions (Addendum)
Take Phenergan every 6 hours as needed for nausea or vomiting.  Take Tessalon every 8 hours as needed for cough.  Alternate with ibuprofen and Tylenol as prescribed over-the-counter, as needed for body aches, fever.  Make sure to get plenty of rest and drink plenty of fluids.  You will need to quarantine at home for 10 days of symptoms and have improving symptoms for 3 days without fever before breaking isolation.  Please return the emergency department if you develop any new or worsening symptoms including intractable vomiting, severe, localized abdominal pain, or severe shortness of breath.  Please have your primary care provider recheck your pancreatic enzyme once you are feeling better, as it was mildly elevated.  This is most likely due to vomiting.

## 2019-07-01 NOTE — ED Provider Notes (Signed)
[facility] EMERGENCY DEPARTMENT Provider Note   CSN: [id number] Arrival date & time: 07/01/19  0754     History Chief Complaint  Patient presents with  . Abdominal Pain  . Emesis  . Diarrhea    Kerry Young is a 53 y.o. female with history of anxiety, migraines, sciatica who presents with a 2-day history of chills, myalgias, abdominal pain, nausea, vomiting, diarrhea, cough, sore throat.  Patient reports she has not been able to sleep over the last several days.  She describes her abdominal pain is generalized achiness, worse in the lower abdomen.  She has been taking Tylenol at home for body aches.  She was around a neighbor briefly who tested positive for COVID-19, however otherwise no known sick contacts.  She denies any loss of taste or smell.  She denies any chest pain, shortness of breath, urinary symptoms, abnormal vaginal bleeding or discharge.  HPI     Past Medical History:  Diagnosis Date  . Anxiety   . Migraines   . Sciatica   . UTI (urinary tract infection)     There are no problems to display for this patient.   Past Surgical History:  Procedure Laterality Date  . WISDOM TOOTH EXTRACTION       OB History   No obstetric history on file.     No family history on file.  Social History   Tobacco Use  . Smoking status: Current Every Day Smoker    Packs/day: 0.15    Types: Cigarettes  . Smokeless tobacco: Never Used  Substance Use Topics  . Alcohol use: Yes    Comment: rare  . Drug use: No    Home Medications Prior to Admission medications   Medication Sig Start Date End Date Taking? Authorizing Provider  benzonatate (TESSALON) 100 MG capsule Take 1 capsule (100 mg total) by mouth every 8 (eight) hours. 07/01/19   Ismael Treptow, Waylan Boga, PA-C  cyclobenzaprine (FLEXERIL) 10 MG tablet Take 1 tablet (10 mg total) by mouth 2 (two) times daily as needed for muscle spasms. 05/18/18   Mortis, Jerrel Ivory I, PA-C  dicyclomine (BENTYL) 20 MG  tablet Take 1 tablet (20 mg total) by mouth 4 (four) times daily -  before meals and at bedtime. 02/07/18   Dione Booze, MD  diphenhydrAMINE (BENADRYL) 25 MG tablet Take 25 mg by mouth 3 (three) times daily as needed for allergies.    [provider]  HYDROcodone-acetaminophen (NORCO/VICODIN) 5-325 MG tablet Take 1 tablet by mouth every 6 (six) hours as needed for severe pain. 05/05/19   Ward, Chase Picket, PA-C  ibuprofen (ADVIL,MOTRIN) 200 MG tablet Take 600 mg by mouth every 8 (eight) hours as needed for headache.    [provider]  naproxen (NAPROSYN) 500 MG tablet Take 1 tablet (500 mg total) by mouth 2 (two) times daily. 10/17/18   Renne Crigler, PA-C  penicillin v potassium (VEETID) 500 MG tablet Take 1 tablet (500 mg total) by mouth 3 (three) times daily. 10/17/18   Renne Crigler, PA-C  promethazine (PHENERGAN) 12.5 MG tablet Take 1-2 tablets (12.5-25 mg total) by mouth every 6 (six) hours as needed for nausea or vomiting. 07/01/19   Lexis Potenza, Waylan Boga, PA-C    Allergies    Ondansetron hcl, Other, Oxycodone-acetaminophen, and Percocet [oxycodone-acetaminophen]  Review of Systems   Review of Systems  Constitutional: Positive for chills. Negative for fever.  HENT: Negative for facial swelling and sore throat.   Respiratory: Positive for cough. Negative  for shortness of breath.   Cardiovascular: Negative for chest pain.  Gastrointestinal: Positive for abdominal pain, diarrhea and vomiting. Negative for blood in stool and nausea.  Genitourinary: Negative for dysuria.  Musculoskeletal: Positive for myalgias. Negative for back pain.  Skin: Negative for rash and wound.  Neurological: Positive for headaches.  Psychiatric/Behavioral: The patient is not nervous/anxious.     Physical Exam Updated Vital Signs BP (!) 102/56   Pulse 65   Temp 97.7 F (36.5 C) (Oral)   Resp 16   SpO2 97%   Physical Exam Vitals and nursing note reviewed.  Constitutional:      General:  She is not in acute distress.    Appearance: She is well-developed. She is not diaphoretic.  HENT:     Head: Normocephalic and atraumatic.     Mouth/Throat:     Pharynx: No pharyngeal swelling, oropharyngeal exudate or posterior oropharyngeal erythema.     Tonsils: No tonsillar exudate or tonsillar abscesses. 1+ on the right. 1+ on the left.  Eyes:     General: No scleral icterus.       Right eye: No discharge.        Left eye: No discharge.     Conjunctiva/sclera: Conjunctivae normal.     Pupils: Pupils are equal, round, and reactive to light.  Neck:     Thyroid: No thyromegaly.  Cardiovascular:     Rate and Rhythm: Normal rate and regular rhythm.     Heart sounds: Normal heart sounds. No murmur. No friction rub. No gallop.   Pulmonary:     Effort: Pulmonary effort is normal. No respiratory distress.     Breath sounds: Normal breath sounds. No stridor. No wheezing or rales.  Abdominal:     General: Bowel sounds are normal. There is no distension.     Palpations: Abdomen is soft.     Tenderness: There is generalized abdominal tenderness. There is no right CVA tenderness, left CVA tenderness, guarding or rebound.  Musculoskeletal:     Cervical back: Normal range of motion and neck supple.  Lymphadenopathy:     Cervical: No cervical adenopathy.  Skin:    General: Skin is warm and dry.     Coloration: Skin is not pale.     Findings: No rash.  Neurological:     Mental Status: She is alert.     Coordination: Coordination normal.  Psychiatric:        Mood and Affect: Mood is anxious.     ED Results / Procedures / Treatments   Labs (all labs ordered are listed, but only abnormal results are displayed) Labs Reviewed  LIPASE, BLOOD - Abnormal; Notable for the following components:      Result Value   Lipase 123 (*)    All other components within normal limits  COMPREHENSIVE METABOLIC PANEL - Abnormal; Notable for the following components:   CO2 19 (*)    All other components  within normal limits  CBC - Abnormal; Notable for the following components:   RBC 5.62 (*)    Hemoglobin 16.2 (*)    HCT 49.5 (*)    All other components within normal limits  URINALYSIS, ROUTINE W REFLEX MICROSCOPIC - Abnormal; Notable for the following components:   Color, Urine AMBER (*)    APPearance HAZY (*)    Specific Gravity, Urine 1.031 (*)    Ketones, ur 5 (*)    Protein, ur 30 (*)    Leukocytes,Ua SMALL (*)    All  other components within normal limits  I-STAT BETA HCG BLOOD, ED ([facility], [facility], [facility] ONLY)  POC SARS CORONAVIRUS 2 AG -  ED    EKG None  Radiology DG Chest Portable 1 View  Result Date: 07/01/2019 CLINICAL DATA:  C/o sharp abd pain, nausea, vomiting, diarrhea, generalized body aches, chills, headache, cough, sore throat, and runny nose x 2 days. Per triage notes 12/12 EXAM: PORTABLE CHEST 1 VIEW COMPARISON:  Chest radiograph 09/09/2016 FINDINGS: Mediastinal contours are within normal limits. Heart size is upper limits of normal which may be secondary to AP technique. The lungs are clear. Mildly prominent interstitial markings are stable from prior. No pneumothorax or large pleural effusion. No acute finding in the visualized skeleton. IMPRESSION: No evidence of active disease in the chest. Electronically Signed   By: Emmaline KluverNancy  Ballantyne M.D.   On: 07/01/2019 10:45    Procedures Procedures (including critical care time)  Medications Ordered in ED Medications  sodium chloride flush (NS) 0.9 % injection 3 mL (3 mLs Intravenous Not Given 07/01/19 1015)  sodium chloride 0.9 % bolus 500 mL (0 mLs Intravenous Stopped 07/01/19 1015)  promethazine (PHENERGAN) injection 12.5 mg (12.5 mg Intravenous Given 07/01/19 0943)  ketorolac (TORADOL) 30 MG/ML injection 15 mg (15 mg Intravenous Given 07/01/19 0943)  sodium chloride 0.9 % bolus 500 mL (0 mLs Intravenous Stopped 07/01/19 1234)  promethazine (PHENERGAN) injection 12.5 mg (12.5 mg Intravenous Given 07/01/19 1311)    acetaminophen (TYLENOL) tablet 650 mg (650 mg Oral Given 07/01/19 1355)  sodium chloride 0.9 % bolus 500 mL (0 mLs Intravenous Stopped 07/01/19 1352)    ED Course  I have reviewed the triage vital signs and the nursing notes.  Pertinent labs & imaging results that were available during my care of the patient were reviewed by me and considered in my medical decision making (see chart for details).    MDM Rules/Calculators/A&P   Patient presenting with cough, nausea, vomiting, sore throat, chills.  Rapid COVID-19 is negative.  Patient refused to repeat for send out.  Suspect viral syndrome, possible COVID-19 or influenza.  Patient with some mild dehydration.  Fluids replaced in ED.  Lipase elevated at 123, however no focal abdominal tenderness.  No significant upper abdominal tenderness, no tenderness on repeat exam.  Patient is tolerating oral fluids.  Will discharge home with Phenergan, Tessalon.  Rest, fluids discussed.  Ibuprofen and Tylenol for body aches, chills.  Quarantine and isolation precautions discussed, especially considering patient refused repeat Covid testing.  Return precautions discussed.  Patient understands and agrees with plan.  Patient vitals stable throughout ED course and discharged in satisfactory condition.  Final Clinical Impression(s) / ED Diagnoses Final diagnoses:  Viral syndrome  Non-intractable vomiting with nausea, unspecified vomiting type  Cough    Rx / DC Orders ED Discharge Orders         Ordered    promethazine (PHENERGAN) 12.5 MG tablet  Every 6 hours PRN     07/01/19 1412    benzonatate (TESSALON) 100 MG capsule  Every 8 hours     07/01/19 1412           Emi HolesLaw, Nieshia Larmon M, PA-C 07/01/19 1418    Cathren LaineSteinl, Kevin, MD 07/02/19 [phone]

## 2019-07-01 NOTE — ED Triage Notes (Signed)
C/o sharp abd pain, nausea, vomiting, diarrhea, generalized body aches, chills, headache, cough, sore throat, and runny nose x 2 days.

## 2019-07-01 NOTE — ED Notes (Signed)
Patient verbalizes understanding of discharge instructions. Opportunity for questioning and answers were provided. Armband removed by staff, pt discharged from ED in wheelchair.  

## 2019-08-14 ENCOUNTER — Other Ambulatory Visit: Payer: Self-pay | Admitting: Adult Health

## 2019-09-04 ENCOUNTER — Ambulatory Visit: Payer: 59 | Admitting: Adult Health

## 2019-09-13 ENCOUNTER — Ambulatory Visit: Payer: 59 | Admitting: Physician Assistant

## 2019-09-29 ENCOUNTER — Ambulatory Visit: Payer: 59 | Admitting: Physician Assistant

## 2019-10-10 ENCOUNTER — Encounter: Payer: Self-pay | Admitting: Physician Assistant

## 2019-10-10 ENCOUNTER — Ambulatory Visit (INDEPENDENT_AMBULATORY_CARE_PROVIDER_SITE_OTHER): Payer: 59 | Admitting: Physician Assistant

## 2019-10-10 ENCOUNTER — Other Ambulatory Visit: Payer: Self-pay

## 2019-10-10 VITALS — BP 117/76 | HR 92 | Ht 62.0 in | Wt 235.0 lb

## 2019-10-10 DIAGNOSIS — F411 Generalized anxiety disorder: Secondary | ICD-10-CM

## 2019-10-10 DIAGNOSIS — G4700 Insomnia, unspecified: Secondary | ICD-10-CM

## 2019-10-10 MED ORDER — ZALEPLON 10 MG PO CAPS: ORAL_CAPSULE | ORAL | 0 refills | Status: DC

## 2019-10-10 MED ORDER — ALPRAZOLAM 0.5 MG PO TABS: 0.2500 mg | ORAL_TABLET | Freq: Two times a day (BID) | ORAL | 0 refills | Status: DC | PRN

## 2019-10-10 MED ORDER — PAROXETINE HCL 10 MG PO TABS: 10.0000 mg | ORAL_TABLET | Freq: Every day | ORAL | 0 refills | Status: DC
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
## 2019-10-17 ENCOUNTER — Other Ambulatory Visit: Payer: Self-pay

## 2019-10-17 ENCOUNTER — Encounter (HOSPITAL_BASED_OUTPATIENT_CLINIC_OR_DEPARTMENT_OTHER): Payer: Self-pay | Admitting: Obstetrics & Gynecology

## 2019-10-23 ENCOUNTER — Other Ambulatory Visit (HOSPITAL_COMMUNITY)
Admission: RE | Admit: 2019-10-23 | Discharge: 2019-10-23 | Disposition: A | Discharge status: Home / Self Care | Payer: 59 | Source: Ambulatory Visit | Attending: Obstetrics & Gynecology | Admitting: Obstetrics & Gynecology

## 2019-10-23 DIAGNOSIS — Z20822 Contact with and (suspected) exposure to covid-19: Secondary | ICD-10-CM | POA: Insufficient documentation

## 2019-10-23 DIAGNOSIS — Z01812 Encounter for preprocedural laboratory examination: Secondary | ICD-10-CM | POA: Insufficient documentation

## 2019-10-23 LAB — SARS CORONAVIRUS 2 (TAT 6-24 HRS): SARS Coronavirus 2: NEGATIVE

## 2019-10-23 NOTE — H&P (Signed)
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  Denies pelvic or abdominal pain  -Vaginal atrophy- on premarin cream, which she stopped taking due to concerns for the potential risk of cancer  Current medications -Xanax -Atorvastatin 20mg  daily -Singulair 10mg  daily -Lyrica 300mg  tid -Paxil 20mg  daily -Tizanidine 5mg  tid prn -Estradiol cream  Past medical history: Insomnia Anxiety Back pain Dyslipidemia  Surgical history: Wisdom teeth  Family History -no h/o breast, uterine, ovarian or colon cancer  Social history: -former smoker- quit 2019 -denies drug or alcohol use  OB history: NSVD x1  GYN history: -postmenopausal- ~21yrs ago  ROS:  Constitutional: No fever or chills HEENT: No blurred or double vision Cardio: No chest pain Resp: No cough, no SOB Urology: No frequency, dysuria, urgency GI: No abdominal pain, no appetite change, no diarrhea, no constipation GU: No breast pain or nipple drainage, see HPI Neuro: No dizziness, yes headaches Psych: Yes anxiety SKin: no rashes, no hives   O: Completed in office Wt: 210lbs, Ht: 62in, BMI: 38, Temp: 97.9, BP 120/80, Pulse: 91 Exam: Const: well developed, well nourished Skin: warm and dry, no rashes Neck: supple, normal appearance Lungs: Normal breathing rate and effort Heart: RRR Abd: obese, soft and non-tender, no rebound, no guarding Musc: no calf tenderness bilaterally Ext: no edema present Psych: appropriate mood and affect  A/P: 53yo PM female who presents for exam under anesthesia/colposcopy due to cervical dysplasia- ASCUS, HPV+ -NPO -LR @ 125cc -SCDs to OR -Risk/benefits and alternatives reviewed with patient- questions and concerns  were addressed and she desires to proceed  , DO [phone] (cell) [phone] (office)

## 2019-10-26 ENCOUNTER — Ambulatory Visit (HOSPITAL_BASED_OUTPATIENT_CLINIC_OR_DEPARTMENT_OTHER)
Admission: RE | Admit: 2019-10-26 | Discharge: 2019-10-26 | Disposition: A | Discharge status: Home / Self Care | Payer: 59 | Attending: Obstetrics & Gynecology | Admitting: Obstetrics & Gynecology

## 2019-10-26 ENCOUNTER — Ambulatory Visit (HOSPITAL_BASED_OUTPATIENT_CLINIC_OR_DEPARTMENT_OTHER): Payer: 59 | Admitting: Certified Registered"

## 2019-10-26 ENCOUNTER — Encounter (HOSPITAL_BASED_OUTPATIENT_CLINIC_OR_DEPARTMENT_OTHER): Payer: Self-pay | Admitting: Obstetrics & Gynecology

## 2019-10-26 ENCOUNTER — Encounter (HOSPITAL_BASED_OUTPATIENT_CLINIC_OR_DEPARTMENT_OTHER)
Admission: RE | Disposition: A | Discharge status: Home / Self Care | Payer: Self-pay | Source: Home / Self Care | Attending: Obstetrics & Gynecology

## 2019-10-26 ENCOUNTER — Other Ambulatory Visit: Payer: Self-pay

## 2019-10-26 DIAGNOSIS — Z6838 Body mass index (BMI) 38.0-38.9, adult: Secondary | ICD-10-CM | POA: Insufficient documentation

## 2019-10-26 DIAGNOSIS — R87810 Cervical high risk human papillomavirus (HPV) DNA test positive: Secondary | ICD-10-CM | POA: Diagnosis not present

## 2019-10-26 DIAGNOSIS — Z87891 Personal history of nicotine dependence: Secondary | ICD-10-CM | POA: Diagnosis not present

## 2019-10-26 DIAGNOSIS — N72 Inflammatory disease of cervix uteri: Secondary | ICD-10-CM | POA: Diagnosis not present

## 2019-10-26 DIAGNOSIS — F419 Anxiety disorder, unspecified: Secondary | ICD-10-CM | POA: Diagnosis not present

## 2019-10-26 DIAGNOSIS — Z79890 Hormone replacement therapy (postmenopausal): Secondary | ICD-10-CM | POA: Insufficient documentation

## 2019-10-26 DIAGNOSIS — E669 Obesity, unspecified: Secondary | ICD-10-CM | POA: Diagnosis not present

## 2019-10-26 DIAGNOSIS — N879 Dysplasia of cervix uteri, unspecified: Secondary | ICD-10-CM | POA: Insufficient documentation

## 2019-10-26 DIAGNOSIS — R87610 Atypical squamous cells of undetermined significance on cytologic smear of cervix (ASC-US): Secondary | ICD-10-CM | POA: Diagnosis present

## 2019-10-26 DIAGNOSIS — E785 Hyperlipidemia, unspecified: Secondary | ICD-10-CM | POA: Insufficient documentation

## 2019-10-26 DIAGNOSIS — Z79899 Other long term (current) drug therapy: Secondary | ICD-10-CM | POA: Diagnosis not present

## 2019-10-26 HISTORY — PX: COLPOSCOPY: SHX161

## 2019-10-26 HISTORY — DX: Other specified postprocedural states: Z98.890

## 2019-10-26 HISTORY — DX: Nausea with vomiting, unspecified: R11.2

## 2019-10-26 SURGERY — COLPOSCOPY
Anesthesia: General | Site: Cervix

## 2019-10-26 MED ORDER — MIDAZOLAM HCL 2 MG/2ML IJ SOLN
INTRAMUSCULAR | Status: AC
  Filled 2019-10-26: qty 2

## 2019-10-26 MED ORDER — LIDOCAINE HCL (CARDIAC) PF 100 MG/5ML IV SOSY
PREFILLED_SYRINGE | INTRAVENOUS | Status: DC | PRN
  Administered 2019-10-26: 60 mg via INTRAVENOUS

## 2019-10-26 MED ORDER — ACETAMINOPHEN 500 MG PO TABS
ORAL_TABLET | ORAL | Status: AC
  Filled 2019-10-26: qty 2

## 2019-10-26 MED ORDER — ACETIC ACID 4% SOLUTION
Status: DC | PRN
  Administered 2019-10-26: 1 via TOPICAL

## 2019-10-26 MED ORDER — SCOPOLAMINE 1 MG/3DAYS TD PT72
1.0000 | MEDICATED_PATCH | TRANSDERMAL | Status: DC
  Administered 2019-10-26: 1.5 mg via TRANSDERMAL

## 2019-10-26 MED ORDER — MIDAZOLAM HCL 2 MG/2ML IJ SOLN: 1.0000 mg | INTRAMUSCULAR | Status: DC | PRN

## 2019-10-26 MED ORDER — DEXAMETHASONE SODIUM PHOSPHATE 4 MG/ML IJ SOLN
INTRAMUSCULAR | Status: DC | PRN
  Administered 2019-10-26: 10 mg via INTRAVENOUS

## 2019-10-26 MED ORDER — LIDOCAINE HCL (PF) 1 % IJ SOLN
INTRAMUSCULAR | Status: AC
  Filled 2019-10-26: qty 30

## 2019-10-26 MED ORDER — PHENYLEPHRINE HCL (PRESSORS) 10 MG/ML IV SOLN
INTRAVENOUS | Status: DC | PRN
  Administered 2019-10-26 (×2): 40 ug via INTRAVENOUS

## 2019-10-26 MED ORDER — LACTATED RINGERS IV SOLN: INTRAVENOUS | Status: DC

## 2019-10-26 MED ORDER — FENTANYL CITRATE (PF) 100 MCG/2ML IJ SOLN
INTRAMUSCULAR | Status: AC
  Filled 2019-10-26: qty 2

## 2019-10-26 MED ORDER — PROMETHAZINE HCL 25 MG/ML IJ SOLN: 6.2500 mg | INTRAMUSCULAR | Status: DC | PRN

## 2019-10-26 MED ORDER — FERRIC SUBSULFATE SOLN
Status: DC | PRN
  Administered 2019-10-26: 1

## 2019-10-26 MED ORDER — FENTANYL CITRATE (PF) 100 MCG/2ML IJ SOLN
25.0000 ug | INTRAMUSCULAR | Status: DC | PRN
  Administered 2019-10-26: 50 ug via INTRAVENOUS

## 2019-10-26 MED ORDER — BUPIVACAINE HCL (PF) 0.5 % IJ SOLN
INTRAMUSCULAR | Status: AC
  Filled 2019-10-26: qty 30

## 2019-10-26 MED ORDER — ACETAMINOPHEN 500 MG PO TABS
1000.0000 mg | ORAL_TABLET | Freq: Once | ORAL | Status: AC
  Administered 2019-10-26: 1000 mg via ORAL

## 2019-10-26 MED ORDER — MEPERIDINE HCL 25 MG/ML IJ SOLN: 6.2500 mg | INTRAMUSCULAR | Status: DC | PRN

## 2019-10-26 MED ORDER — KETOROLAC TROMETHAMINE 30 MG/ML IJ SOLN: 30.0000 mg | Freq: Once | INTRAMUSCULAR | Status: DC | PRN

## 2019-10-26 MED ORDER — DEXAMETHASONE SODIUM PHOSPHATE 10 MG/ML IJ SOLN
INTRAMUSCULAR | Status: AC
  Filled 2019-10-26: qty 1

## 2019-10-26 MED ORDER — FENTANYL CITRATE (PF) 100 MCG/2ML IJ SOLN: 50.0000 ug | INTRAMUSCULAR | Status: DC | PRN

## 2019-10-26 MED ORDER — HYDROCODONE-ACETAMINOPHEN 7.5-325 MG PO TABS: 1.0000 | ORAL_TABLET | Freq: Once | ORAL | Status: DC | PRN

## 2019-10-26 MED ORDER — IODINE STRONG (LUGOLS) 5 % PO SOLN
ORAL | Status: AC
  Filled 2019-10-26: qty 1

## 2019-10-26 MED ORDER — BUPIVACAINE HCL (PF) 0.25 % IJ SOLN
INTRAMUSCULAR | Status: AC
  Filled 2019-10-26: qty 30

## 2019-10-26 MED ORDER — FERRIC SUBSULFATE 259 MG/GM EX SOLN
CUTANEOUS | Status: AC
  Filled 2019-10-26: qty 8

## 2019-10-26 MED ORDER — SCOPOLAMINE 1 MG/3DAYS TD PT72
MEDICATED_PATCH | TRANSDERMAL | Status: AC
  Filled 2019-10-26: qty 1

## 2019-10-26 MED ORDER — MIDAZOLAM HCL 5 MG/5ML IJ SOLN
INTRAMUSCULAR | Status: DC | PRN
  Administered 2019-10-26: 2 mg via INTRAVENOUS

## 2019-10-26 MED ORDER — ONDANSETRON HCL 4 MG/2ML IJ SOLN
INTRAMUSCULAR | Status: AC
  Filled 2019-10-26: qty 2

## 2019-10-26 MED ORDER — FENTANYL CITRATE (PF) 100 MCG/2ML IJ SOLN
INTRAMUSCULAR | Status: DC | PRN
  Administered 2019-10-26 (×2): 50 ug via INTRAVENOUS

## 2019-10-26 MED ORDER — PROPOFOL 10 MG/ML IV BOLUS
INTRAVENOUS | Status: DC | PRN
  Administered 2019-10-26: 200 mg via INTRAVENOUS

## 2019-10-26 SURGICAL SUPPLY — 16 items
DILATOR CANAL MILEX (MISCELLANEOUS) ×2 IMPLANT
GAUZE 4X4 16PLY RFD (DISPOSABLE) IMPLANT
GLOVE BIOGEL PI IND STRL 6.5 (GLOVE) ×1 IMPLANT
GLOVE BIOGEL PI IND STRL 7.0 (GLOVE) ×1 IMPLANT
GLOVE BIOGEL PI INDICATOR 6.5 (GLOVE) ×2
GLOVE BIOGEL PI INDICATOR 7.0 (GLOVE) ×2
GLOVE ECLIPSE 6.5 STRL STRAW (GLOVE) ×3 IMPLANT
GOWN STRL REUS W/TWL LRG LVL3 (GOWN DISPOSABLE) ×7 IMPLANT
PACK VAGINAL MINOR WOMEN LF (CUSTOM PROCEDURE TRAY) ×3 IMPLANT
PAD OB MATERNITY 4.3X12.25 (PERSONAL CARE ITEMS) ×3 IMPLANT
PAD PREP 24X48 CUFFED NSTRL (MISCELLANEOUS) ×3 IMPLANT
SCOPETTES 8  STERILE (MISCELLANEOUS) ×3
SCOPETTES 8 STERILE (MISCELLANEOUS) ×1 IMPLANT
SLEEVE SCD COMPRESS KNEE MED (MISCELLANEOUS) ×4 IMPLANT
TOWEL GREEN STERILE FF (TOWEL DISPOSABLE) ×3 IMPLANT
UNDERPAD 30X36 HEAVY ABSORB (UNDERPADS AND DIAPERS) ×3 IMPLANT

## 2019-10-26 NOTE — Op Note (Signed)
Operative Report  PreOp: ASCUS, HPV+ PostOp: same Procedure:  Exam under anesthesia, colposcopy with cervical biopsies Surgeon: Dr. Myna Hidalgo Anesthesia: General Complications:none EBL: Minimal IVF:800cc  Findings: HPV associated changes  Specimens: ECC and cervical biopsies   Procedure: The patient was taken to the operating room where she underwent general anesthesia without difficulty. The patient was placed in a low lithotomy position using Allen stirrups. She was then prepped and draped in the normal sterile fashion. TA sterile speculum was inserted into the vagina. Cervix was visualized, acetic acid was used.  Colposcope was used for visualized of the transformation zone.  +acetowhite changes were noted with minimal punctation seen.  ECC and cervical biopsies were obtained without difficulty.  Monsels were placed, hemostasis was achieved.  All instruments were removed.  The patient was repositioned to the supine position. The patient tolerated the procedure without any complications and taken to recovery in stable condition.   Myna Hidalgo, DO [phone] (pager) [phone] (office)

## 2019-10-26 NOTE — Transfer of Care (Signed)
Immediate Anesthesia Transfer of Care Note  Patient: Kerry Young  Procedure(s) Performed: COLPOSCOPY (N/A )  Patient Location: PACU  Anesthesia Type:General  Level of Consciousness: awake and patient cooperative  Airway & Oxygen Therapy: Patient Spontanous Breathing and Patient connected to face mask oxygen  Post-op Assessment: Report given to RN and Post -op Vital signs reviewed and stable  Post vital signs: Reviewed and stable  Last Vitals:  Vitals Value Taken Time  BP    Temp    Pulse 76 10/26/19 0751  Resp 13 10/26/19 0751  SpO2 99 % 10/26/19 0751  Vitals shown include unvalidated device data.  Last Pain:  Vitals:   10/26/19 0651  TempSrc: Oral  PainSc: 7       Patients Stated Pain Goal: 4 (10/26/19 9689)  Complications: No apparent anesthesia complications

## 2019-10-26 NOTE — Interval H&P Note (Signed)
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  The various methods of treatment have been discussed with the patient and family. After consideration of risks, benefits and other options for treatment, the patient has consented to  Procedure(s): COLPOSCOPY (N/A) with cervical biopsies as a surgical intervention.  The patient's history has been reviewed, patient examined, no change in status, stable for surgery.  I have reviewed the patient's chart and labs.  Questions were answered to the patient's satisfaction.     Sharon Seller

## 2019-10-26 NOTE — Discharge Instructions (Signed)
HOME INSTRUCTIONS  Please note any unusual or excessive bleeding, pain, swelling. Mild dizziness or drowsiness are normal for about 24 hours after surgery.   Shower when comfortable  Restrictions: No driving for 24 hours or while taking pain medications.  Activity:  Nothing in vagina (no tampons, douching, or intercourse) x 1 weeks; no tub baths for 1 weeks Vaginal spotting is expected but if your bleeding is heavy, period like,  please call the office   Diet:  You may return to your regular diet.  Do not eat large meals.  Eat small frequent meals throughout the day.  Continue to drink a good amount of water at least 6-8 glasses of water per day, hydration is very important for the healing process.  Pain Management: Take tylenol or ibuprofen (with food) if needed  Always take prescription pain medication with food, it may cause constipation, increase fluids and fiber and you may want to take an over-the-counter stool softener like Colace as needed up to 2x a day.    Alcohol -- Avoid for 24 hours and while taking pain medications.  Nausea: Take sips of ginger ale or soda  Fever -- Call physician if temperature over 101 degrees  Follow up:  If you experience fever (a temperature greater than 100.4), pain unrelieved by pain medication, shortness of breath, swelling of a single leg, or any other symptoms which are concerning to you please the office immediately.

## 2019-10-26 NOTE — Anesthesia Preprocedure Evaluation (Signed)
Anesthesia Evaluation  Patient identified by MRN, date of birth, ID band Patient awake    Reviewed: Allergy & Precautions, NPO status , Patient's Chart, lab work & pertinent test results  History of Anesthesia Complications (+) PONV and history of anesthetic complications  Airway Mallampati: II  TM Distance: >3 FB Neck ROM: Full    Dental no notable dental hx. (+) Teeth Intact, Dental Advisory Given   Pulmonary former smoker,  Quit smoking 2019   Pulmonary exam normal breath sounds clear to auscultation       Cardiovascular negative cardio ROS Normal cardiovascular exam Rhythm:Regular Rate:Normal     Neuro/Psych  Headaches, PSYCHIATRIC DISORDERS Anxiety    GI/Hepatic negative GI ROS, Neg liver ROS,   Endo/Other  Obesity BMI 38  Renal/GU negative Renal ROS   Cervical HPV positive    Musculoskeletal sciatica   Abdominal (+) + obese,   Peds negative pediatric ROS (+)  Hematology negative hematology ROS (+)   Anesthesia Other Findings   Reproductive/Obstetrics negative OB ROS                             Anesthesia Physical Anesthesia Plan  ASA: II  Anesthesia Plan: General   Post-op Pain Management:    Induction: Intravenous  PONV Risk Score and Plan: 4 or greater and Ondansetron, Dexamethasone, Midazolam, Scopolamine patch - Pre-op and Treatment may vary due to age or medical condition  Airway Management Planned: LMA  Additional Equipment: None  Intra-op Plan:   Post-operative Plan: Extubation in OR  Informed Consent: I have reviewed the patients History and Physical, chart, labs and discussed the procedure including the risks, benefits and alternatives for the proposed anesthesia with the patient or authorized representative who has indicated his/her understanding and acceptance.     Dental advisory given  Plan Discussed with: CRNA  Anesthesia Plan Comments:          Anesthesia Quick Evaluation

## 2019-10-26 NOTE — Anesthesia Postprocedure Evaluation (Signed)
Anesthesia Post Note  Patient: Kerry Young  Procedure(s) Performed: COLPOSCOPY with cervical biopsy (N/A Cervix)     Patient location during evaluation: PACU Anesthesia Type: General Level of consciousness: awake and alert, oriented and patient cooperative Pain management: pain level controlled Vital Signs Assessment: post-procedure vital signs reviewed and stable Respiratory status: spontaneous breathing, nonlabored ventilation and respiratory function stable Cardiovascular status: blood pressure returned to baseline and stable Postop Assessment: no apparent nausea or vomiting Anesthetic complications: no    Last Vitals:  Vitals:   10/26/19 0800 10/26/19 0801  BP: 126/90   Pulse: 63   Resp: 16   Temp:  36.6 C  SpO2: 100%     Last Pain:  Vitals:   10/26/19 0801  TempSrc:   PainSc: 8                  Lannie Fields

## 2019-10-26 NOTE — Anesthesia Procedure Notes (Signed)
Procedure Name: LMA Insertion Date/Time: 10/26/2019 7:30 AM Performed by: Kearns Desanctis, CRNA Pre-anesthesia Checklist: Patient identified, Emergency Drugs available, Suction available, Patient being monitored and Timeout performed Patient Re-evaluated:Patient Re-evaluated prior to induction Oxygen Delivery Method: Circle system utilized Preoxygenation: Pre-oxygenation with 100% oxygen Induction Type: IV induction Ventilation: Mask ventilation without difficulty LMA: LMA inserted LMA Size: 4.0 Number of attempts: 1 Airway Equipment and Method: Bite block Placement Confirmation: positive ETCO2 Tube secured with: Tape Dental Injury: Teeth and Oropharynx as per pre-operative assessment

## 2019-10-27 LAB — SURGICAL PATHOLOGY

## 2019-11-07 ENCOUNTER — Telehealth: Payer: Self-pay | Admitting: Physician Assistant

## 2019-11-07 ENCOUNTER — Other Ambulatory Visit: Payer: Self-pay

## 2019-11-07 ENCOUNTER — Ambulatory Visit (INDEPENDENT_AMBULATORY_CARE_PROVIDER_SITE_OTHER): Payer: 59 | Admitting: Physician Assistant

## 2019-11-07 ENCOUNTER — Encounter: Payer: Self-pay | Admitting: Physician Assistant

## 2019-11-07 DIAGNOSIS — F411 Generalized anxiety disorder: Secondary | ICD-10-CM

## 2019-11-07 DIAGNOSIS — G4700 Insomnia, unspecified: Secondary | ICD-10-CM | POA: Diagnosis not present

## 2019-11-07 MED ORDER — CITALOPRAM HYDROBROMIDE 10 MG PO TABS: 10.0000 mg | ORAL_TABLET | Freq: Every day | ORAL | 1 refills | Status: DC

## 2019-11-07 MED ORDER — FLUVOXAMINE MALEATE 50 MG PO TABS: 50.0000 mg | ORAL_TABLET | Freq: Every day | ORAL | 1 refills | Status: DC

## 2019-11-07 MED ORDER — ALPRAZOLAM 0.5 MG PO TABS: 0.5000 mg | ORAL_TABLET | Freq: Two times a day (BID) | ORAL | 0 refills | Status: DC | PRN

## 2019-11-07 NOTE — Telephone Encounter (Signed)
I spoke w/ [facility] Pharmacist.  Possible serious interaction between tizanidine and Luvox.  I appreciate him catching that.  Discontinue the order for Luvox.  Prescription for Celexa 10 mg #31 p.o. daily with 1 refill was given verbally.

## 2019-11-07 NOTE — Telephone Encounter (Signed)
Pharmacy called and said that there is a drug interaction between the tizapine and the luvox that was prescribed today. Please call and let them know if it is ok to fill the luvox [phone]

## 2019-11-07 NOTE — Progress Notes (Signed)
[facility]  Patient ID: Kerry Young,  MRN: [id number]  PCP: Ileana Ladd, MD  Date of Evaluation: 11/07/2019 Time spent:20 minutes  Chief Complaint:  Chief Complaint    Follow-up      HISTORY/CURRENT STATUS: HPI For routine med check.   We started the Paxil, Sonata, and Xanax.  Paxil is causing more anxiety.  Doesn't like the way it makes her feel.  Since being on it, she has been more jittery inside.  Wonders if we can increase the Xanax to help with anxiety.  The Xanax is helping the anxiety, just not enough.  Has panicky feelings often, every day in fact.  Not always a full-blown panic attack but she feels jittery like something bad is going to happen.  Patient denies loss of interest in usual activities and is able to enjoy things.  Denies decreased energy or motivation.  Appetite has not changed.  No extreme sadness, tearfulness, or feelings of hopelessness.  Denies any changes in concentration, making decisions or remembering things.  Denies suicidal or homicidal thoughts.  Denies dizziness, syncope, seizures, numbness, tingling, tremor, tics, unsteady gait, slurred speech, confusion. Denies muscle or joint pain, stiffness, or dystonia.  Individual Medical History/ Review of Systems: Changes? :No    Past medications for mental health diagnoses include: Zoloft, mirtazapine, Trintellix, Viibryd, Cymbalta, trazodone, Xanax, Paxil, Seroquel, Klonopin, Ambien without relief, Lunesta no improvement,   Allergies: Ondansetron hcl, Other, Oxycodone-acetaminophen, and Percocet [oxycodone-acetaminophen]  Current Medications:  Current Outpatient Medications:  .  ALPRAZolam (XANAX) 0.5 MG tablet, Take 0.5-1 tablets (0.25-0.5 mg total) by mouth 2 (two) times daily as needed for anxiety., Disp: 60 tablet, Rfl: 0 .  atorvastatin (LIPITOR) 20 MG tablet, Take 1 tablet by mouth once daily, Disp: , Rfl:  .  diphenhydrAMINE (BENADRYL) 25 MG tablet, Take 25 mg by mouth 3  (three) times daily as needed for allergies., Disp: , Rfl:  .  ibuprofen (ADVIL,MOTRIN) 200 MG tablet, Take 600 mg by mouth every 8 (eight) hours as needed for headache., Disp: , Rfl:  .  montelukast (SINGULAIR) 10 MG tablet, Take 10 mg by mouth daily., Disp: , Rfl:  .  pregabalin (LYRICA) 300 MG capsule, , Disp: , Rfl:  .  tiZANidine (ZANAFLEX) 4 MG tablet, Take by mouth., Disp: , Rfl:  .  ALPRAZolam (XANAX) 0.5 MG tablet, Take 1 tablet (0.5 mg total) by mouth 2 (two) times daily as needed for anxiety (with an extra 1/2-1 po midday prn.)., Disp: 75 tablet, Rfl: 0 .  citalopram (CELEXA) 10 MG tablet, Take 1 tablet (10 mg total) by mouth daily., Disp: 30 tablet, Rfl: 1 Medication Side Effects: none  Family Medical/ Social History: Changes? No  MENTAL HEALTH EXAM:  There were no vitals taken for this visit.There is no height or weight on file to calculate BMI.  General Appearance: Casual, Neat and Well Groomed  Eye Contact:  Good  Speech:  Clear and Coherent and Normal Rate  Volume:  Normal  Mood:  Euthymic  Affect:  Appropriate  Thought Process:  Goal Directed and Descriptions of Associations: Intact  Orientation:  Full (Time, Place, and Person)  Thought Content: Logical   Suicidal Thoughts:  No  Homicidal Thoughts:  No  Memory:  WNL  Judgement:  Good  Insight:  Good  Psychomotor Activity:  Normal  Concentration:  Concentration: Good  Recall:  Good  Fund of Knowledge: Good  Language: Good  Assets:  Desire for Improvement  ADL's:  Intact  Cognition: WNL  Prognosis:  Good    DIAGNOSES:    ICD-10-CM   1. Generalized anxiety disorder  F41.1   2. Insomnia, unspecified type  G47.00     Receiving Psychotherapy: No    RECOMMENDATIONS:  PDMP reviewed. I spent 20 minutes with her. I recommend we try different SSRI.  I initially ordered Luvox.  However the pharmacist caught an interaction between Luvox and Zanaflex.  I have changed the SSRI to Celexa 10 mg daily. Start Celexa  10 mg, 1 p.o. daily. Continue Xanax 0.5 mg, 1 p.o. twice daily as needed with an occasional extra 1/2-1 midday as needed. Return in 4 weeks.  Donnal Moat, PA-C

## 2019-12-06 ENCOUNTER — Encounter: Payer: Self-pay | Admitting: Physician Assistant

## 2019-12-06 ENCOUNTER — Other Ambulatory Visit: Payer: Self-pay

## 2019-12-06 ENCOUNTER — Ambulatory Visit (INDEPENDENT_AMBULATORY_CARE_PROVIDER_SITE_OTHER): Payer: 59 | Admitting: Physician Assistant

## 2019-12-06 DIAGNOSIS — F320 Major depressive disorder, single episode, mild: Secondary | ICD-10-CM | POA: Diagnosis not present

## 2019-12-06 DIAGNOSIS — F32A Depression, unspecified: Secondary | ICD-10-CM

## 2019-12-06 DIAGNOSIS — F411 Generalized anxiety disorder: Secondary | ICD-10-CM

## 2019-12-06 DIAGNOSIS — G4700 Insomnia, unspecified: Secondary | ICD-10-CM | POA: Diagnosis not present

## 2019-12-06 MED ORDER — ALPRAZOLAM 0.5 MG PO TABS: 0.5000 mg | ORAL_TABLET | Freq: Three times a day (TID) | ORAL | 0 refills | Status: DC | PRN

## 2019-12-06 MED ORDER — BUSPIRONE HCL 15 MG PO TABS: ORAL_TABLET | ORAL | 1 refills | Status: DC

## 2019-12-06 NOTE — Progress Notes (Signed)
[facility]  Patient ID: Kerry Young,  MRN: [id number]  PCP: Ileana Ladd, MD  Date of Evaluation: 12/06/2019 Time spent:20 minutes  Chief Complaint:  Chief Complaint    Follow-up      HISTORY/CURRENT STATUS: HPI For routine med check.   At the last visit we started Celexa, she took only 2 pills and had a horrible headache with nausea and vomiting.  So she stopped it.  She does have a history of migraines.  Did not have a headache until she took the Celexa so feels that was the cause.  The Xanax is helping the anxiety, just not enough. Feels like something bad is going to happen.  Everyday, she feels on edge, like she might get a PA. Will sometimes be triggered and sometimes it's out of the blue.  She feels like the room is closing in on her, gets SOB, will have palpitations and chest discomfort.  Has had w/u with Dr. Modesto Charon for heart issues and it's all negative.   Patient denies loss of interest in usual activities and is able to enjoy things. 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  Denies decreased energy or motivation.  Appetite has not changed.  No extreme sadness, tearfulness, or feelings of hopelessness.  Denies any changes in concentration, making decisions or remembering things.  Denies suicidal or homicidal thoughts.  Patient denies increased energy with decreased need for sleep, no increased talkativeness, no racing thoughts, no impulsivity or risky behaviors, no increased spending, no increased libido, no grandiosity, no increased irritability or anger, and no hallucinations.  Denies dizziness, syncope, seizures, numbness, tingling, tremor, tics, unsteady gait, slurred speech, confusion. Denies muscle or joint pain, stiffness, or dystonia.  Individual Medical History/ Review of Systems: Changes? :No    Past medications for mental health diagnoses include: Zoloft, mirtazapine, Trintellix, Viibryd, Cymbalta,  trazodone, Xanax, Paxil, Seroquel, Klonopin, Ambien without relief, Lunesta no improvement, Celexa caused n/v and H/A  Allergies: Celexa [citalopram], Ondansetron hcl, Other, Oxycodone-acetaminophen, and Percocet [oxycodone-acetaminophen]  Current Medications:  Current Outpatient Medications:  .  ALPRAZolam (XANAX) 0.5 MG tablet, Take 1 tablet (0.5 mg total) by mouth 3 (three) times daily as needed for anxiety., Disp: 90 tablet, Rfl: 0 .  atorvastatin (LIPITOR) 20 MG tablet, Take 1 tablet by mouth once daily, Disp: , Rfl:  .  diphenhydrAMINE (BENADRYL) 25 MG tablet, Take 25 mg by mouth 3 (three) times daily as needed for allergies., Disp: , Rfl:  .  ibuprofen (ADVIL,MOTRIN) 200 MG tablet, Take 600 mg by mouth every 8 (eight) hours as needed for headache., Disp: , Rfl:  .  montelukast (SINGULAIR) 10 MG tablet, Take 10 mg by mouth daily., Disp: , Rfl:  .  pregabalin (LYRICA) 300 MG capsule, , Disp: , Rfl:  .  tiZANidine (ZANAFLEX) 4 MG tablet, Take by mouth., Disp: , Rfl:  .  busPIRone (BUSPAR) 15 MG tablet, 1/3 po bid for 1 week, then 2/3 po bid for 1 week, then 1 po bid., Disp: 60 tablet, Rfl: 1 .  citalopram (CELEXA) 10 MG tablet, Take 1 tablet (10 mg total) by mouth daily., Disp: 30 tablet, Rfl: 1 Medication Side Effects: none  Family Medical/ Social History: Changes? No  MENTAL HEALTH EXAM:  There were no vitals taken for this visit.There is no height or weight on file to calculate BMI.  General Appearance: Casual, Neat, Well Groomed and Obese  Eye Contact:  Good  Speech:  Clear and Coherent and Normal Rate  Volume:  Normal  Mood:  Euthymic  Affect:  Appropriate  Thought Process:  Goal Directed and Descriptions of Associations: Intact  Orientation:  Full (Time, Place, and Person)  Thought Content: Logical   Suicidal Thoughts:  No  Homicidal Thoughts:  No  Memory:  WNL  Judgement:  Good  Insight:  Good  Psychomotor Activity:  Normal  Concentration:  Concentration: Good and  Attention Span: Good  Recall:  Good  Fund of Knowledge: Good  Language: Good  Assets:  Desire for Improvement  ADL's:  Intact  Cognition: WNL  Prognosis:  Good    DIAGNOSES:    ICD-10-CM   1. Generalized anxiety disorder  F41.1   2. Insomnia, unspecified type  G47.00   3. 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 I spent 20 minutes with her. We discussed other medicines to help with the anxiety.  She has never tried Hydrologist.  She is willing to try.  We discussed the benefits, risks, and side effects and she accepts.  This is not likely to help with depression but I feel like the depression she has is mild and is likely caused by the anxiety to begin with. Start BuSpar 15 mg, 1/3 pill p.o. twice daily for 1 week, 2/3 pill p.o. twice daily for 1 week, and then 1 p.o. twice daily. Continue Xanax 0.5 mg, 1 p.o. twice daily as needed with an occasional extra 1/2-1 midday as needed. Return in 4 weeks.  Donnal Moat, PA-C

## 2020-01-04 ENCOUNTER — Other Ambulatory Visit: Payer: Self-pay

## 2020-01-04 ENCOUNTER — Encounter: Payer: Self-pay | Admitting: Physician Assistant

## 2020-01-04 ENCOUNTER — Ambulatory Visit (INDEPENDENT_AMBULATORY_CARE_PROVIDER_SITE_OTHER): Payer: 59 | Admitting: Physician Assistant

## 2020-01-04 DIAGNOSIS — F411 Generalized anxiety disorder: Secondary | ICD-10-CM

## 2020-01-04 DIAGNOSIS — G4700 Insomnia, unspecified: Secondary | ICD-10-CM

## 2020-01-04 MED ORDER — BUSPIRONE HCL 15 MG PO TABS: 15.0000 mg | ORAL_TABLET | Freq: Three times a day (TID) | ORAL | 1 refills | Status: DC

## 2020-01-04 MED ORDER — ALPRAZOLAM 0.5 MG PO TABS: 0.5000 mg | ORAL_TABLET | Freq: Four times a day (QID) | ORAL | 1 refills | Status: DC | PRN

## 2020-01-04 NOTE — Progress Notes (Signed)
[facility]  Patient ID: Kerry Young,  MRN: [id number]  PCP: Vernie Shanks, MD  Date of Evaluation: 01/04/2020 Time spent:20 minutes  Chief Complaint:  Chief Complaint    Anxiety      HISTORY/CURRENT STATUS: HPI For routine med check.   Presents to discuss anxiety.  1 month ago, we added BuSpar.  She thinks it might be helping a little bit.  She has only been on the 15 mg twice a day for 2 weeks so is not completely sure.  She does still have a lot of anxiety and request increasing the Xanax to 4 times a day if at all possible.  It does not last long enough when she only has enough for 3 times a day.  The BuSpar has not caused any side effects like the other drugs she has used for prevention of anxiety.  She is thankful for that.  Not working.  States that she won her disability hearing although asked not in writing yet.  Patient denies loss of interest in usual activities and is able to enjoy things.  Denies decreased energy or motivation.  Appetite has not changed.  No extreme sadness, tearfulness, or feelings of hopelessness.  Denies any changes in concentration, making decisions or remembering things.  Sleeps good most of the time.  Denies suicidal or homicidal thoughts.   Patient denies increased energy with decreased need for sleep, no increased talkativeness, no racing thoughts, no impulsivity or risky behaviors, no increased spending, no increased libido, no grandiosity, no increased irritability or anger, and no hallucinations.  Denies dizziness, syncope, seizures, numbness, tingling, tremor, tics, unsteady gait, slurred speech, confusion. Denies muscle or joint pain, stiffness, or dystonia.  Individual Medical History/ Review of Systems: Changes? :No    Past medications for mental health diagnoses include: Zoloft, mirtazapine, Trintellix, Viibryd, Cymbalta, trazodone, Xanax, Paxil, Seroquel, Klonopin, Ambien without relief, Lunesta no improvement, Celexa caused  n/v and H/A  Allergies: Celexa [citalopram], Ondansetron hcl, Other, Oxycodone-acetaminophen, and Percocet [oxycodone-acetaminophen]  Current Medications:  Current Outpatient Medications:  .  ALPRAZolam (XANAX) 0.5 MG tablet, Take 1 tablet (0.5 mg total) by mouth 4 (four) times daily as needed for anxiety., Disp: 120 tablet, Rfl: 1 .  atorvastatin (LIPITOR) 20 MG tablet, Take 1 tablet by mouth once daily, Disp: , Rfl:  .  diphenhydrAMINE (BENADRYL) 25 MG tablet, Take 25 mg by mouth 3 (three) times daily as needed for allergies., Disp: , Rfl:  .  ibuprofen (ADVIL,MOTRIN) 200 MG tablet, Take 600 mg by mouth every 8 (eight) hours as needed for headache., Disp: , Rfl:  .  montelukast (SINGULAIR) 10 MG tablet, Take 10 mg by mouth daily., Disp: , Rfl:  .  pregabalin (LYRICA) 300 MG capsule, , Disp: , Rfl:  .  tiZANidine (ZANAFLEX) 4 MG tablet, Take by mouth., Disp: , Rfl:  .  busPIRone (BUSPAR) 15 MG tablet, Take 1 tablet (15 mg total) by mouth 3 (three) times daily., Disp: 90 tablet, Rfl: 1 Medication Side Effects: none  Family Medical/ Social History: Changes? No  MENTAL HEALTH EXAM:  There were no vitals taken for this visit.There is no height or weight on file to calculate BMI.  General Appearance: Casual, Neat, Well Groomed and Obese  Eye Contact:  Good  Speech:  Clear and Coherent and Normal Rate  Volume:  Normal  Mood:  Euthymic  Affect:  Appropriate  Thought Process:  Goal Directed and Descriptions of Associations: Intact  Orientation:  Full (Time, Place,  and Person)  Thought Content: Logical   Suicidal Thoughts:  No  Homicidal Thoughts:  No  Memory:  WNL  Judgement:  Good  Insight:  Good  Psychomotor Activity:  Normal  Concentration:  Concentration: Good and Attention Span: Good  Recall:  Good  Fund of Knowledge: Good  Language: Good  Assets:  Desire for Improvement  ADL's:  Intact  Cognition: WNL  Prognosis:  Good    DIAGNOSES:    ICD-10-CM   1. Generalized anxiety  disorder  F41.1   2. Insomnia, unspecified type  G47.00     Receiving Psychotherapy: No    RECOMMENDATIONS:  PDMP reviewed. I provided 20 minutes of face-to-face care during this encounter. Increase BuSpar to 15 mg, 1 p.o. 3 times daily. Increase Xanax 0.5 mg, to 1 p.o. 4 times daily as needed.  Sedation precautions were given. Return in 6 weeks.  Melony Overly, PA-C

## 2020-02-02 ENCOUNTER — Emergency Department (HOSPITAL_COMMUNITY): Payer: 59

## 2020-02-02 ENCOUNTER — Other Ambulatory Visit: Payer: Self-pay

## 2020-02-02 ENCOUNTER — Emergency Department (HOSPITAL_COMMUNITY)
Admission: EM | Admit: 2020-02-02 | Discharge: 2020-02-02 | Disposition: A | Discharge status: Home / Self Care | Payer: 59 | Attending: Emergency Medicine | Admitting: Emergency Medicine

## 2020-02-02 ENCOUNTER — Encounter (HOSPITAL_COMMUNITY): Payer: Self-pay

## 2020-02-02 DIAGNOSIS — Z87891 Personal history of nicotine dependence: Secondary | ICD-10-CM | POA: Diagnosis not present

## 2020-02-02 DIAGNOSIS — G8929 Other chronic pain: Secondary | ICD-10-CM | POA: Insufficient documentation

## 2020-02-02 DIAGNOSIS — M545 Low back pain: Secondary | ICD-10-CM | POA: Diagnosis not present

## 2020-02-02 IMAGING — MR MR LUMBAR SPINE W/O CM
5 series · 31 of 48 positions shown · non-contrast
Comparison: Abdominopelvic CT [DATE]. Lumbar MRI [DATE].
Prior chest radiographs.

CLINICAL DATA: Chronic low back pain. Increased pain today
radiating into the right thigh. Cauda equina syndrome suspected.

EXAM:
MRI LUMBAR SPINE WITHOUT CONTRAST
TECHNIQUE: Multiplanar, multisequence MR imaging of the lumbar spine was
performed. No intravenous contrast was administered.

[Series 5: T1 · sagittal · 4.0mm · 0.75mm/px · 6 of 17 slices shown (1 of 2)]
[im 1/17]
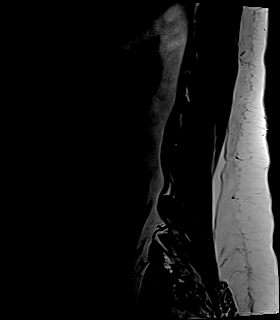
[im 4/17]
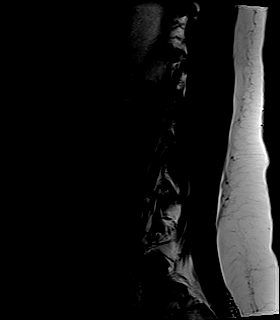
[im 7/17]
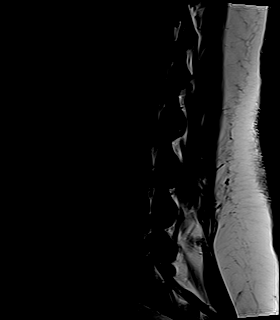
[im 10/17]
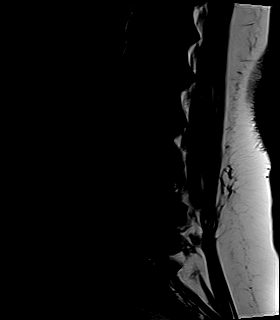
[im 13/17]
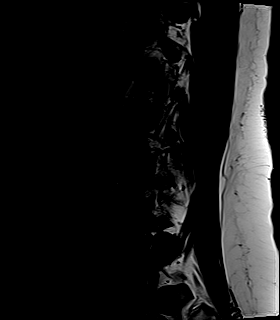
[im 17/17]
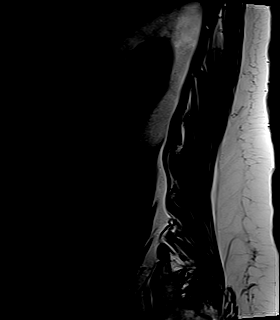

[Series 6: T2 · sagittal · 4.0mm · 0.75mm/px · 7 of 17 slices shown (1 of 2)]
[im 1/17]
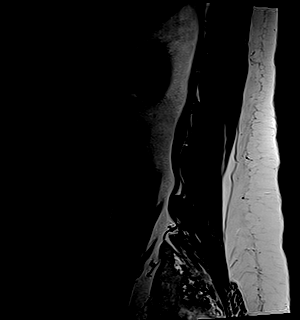
[im 3/17]
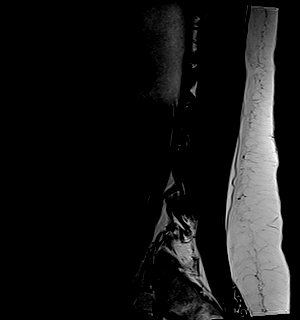
[im 6/17]
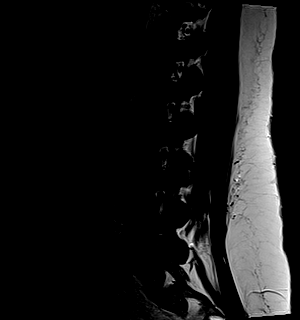
[im 9/17]
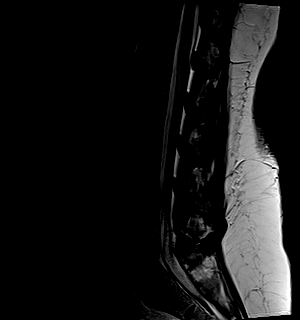
[im 11/17]
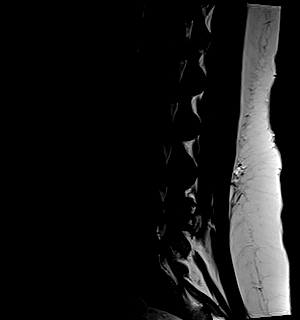
[im 14/17]
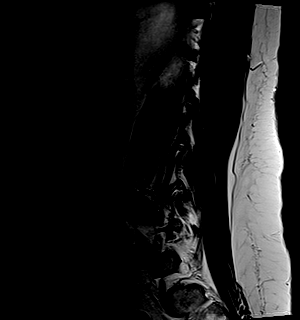
[im 17/17]
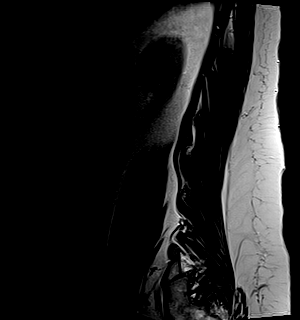

[Series 7: STIR · sagittal · 4.0mm · 0.47mm/px · 2 of 17 slices shown]
[im 1/17]
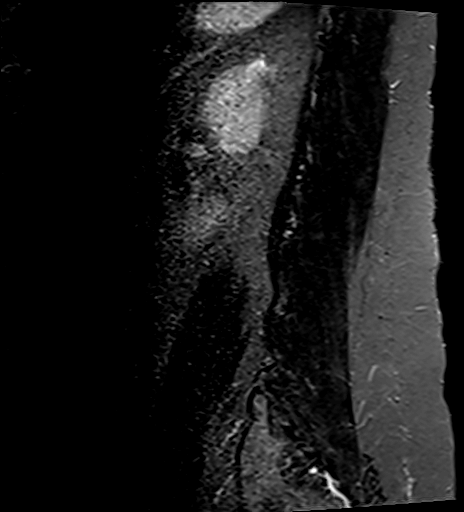
[im 3/17]
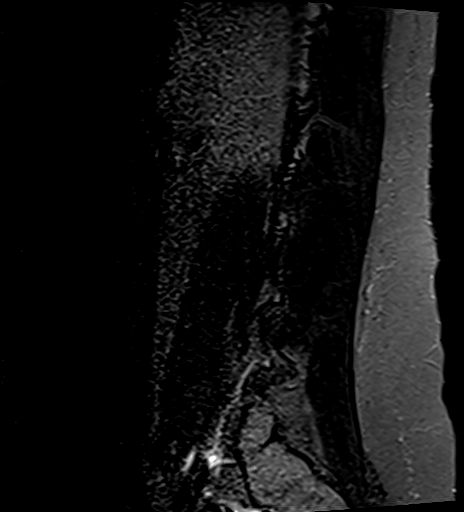

[Series 8: T2 · axial · 4.0mm · 0.62mm/px · z∈[-107,+71]mm · 8 of 33 slices shown (2 of 2)]
[im 1/33]
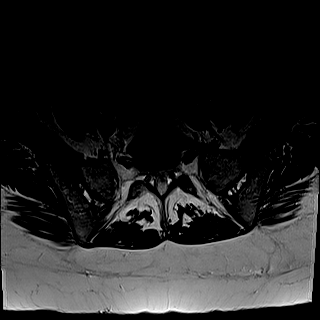
[im 5/33]
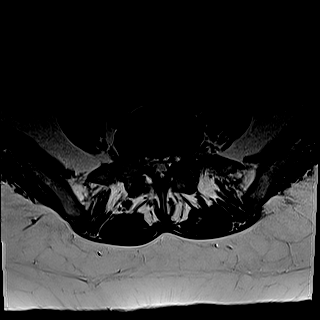
[im 10/33]
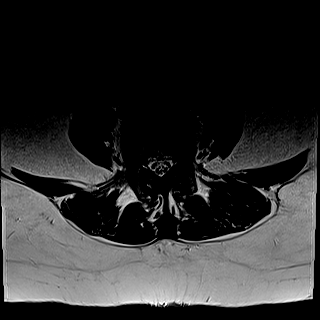
[im 15/33]
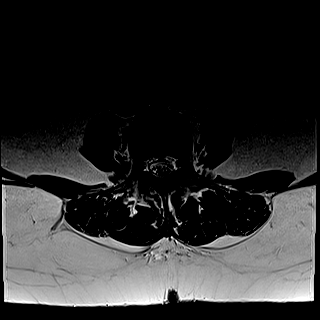
[im 18/33]
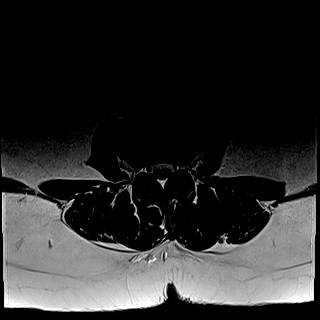
[im 23/33]
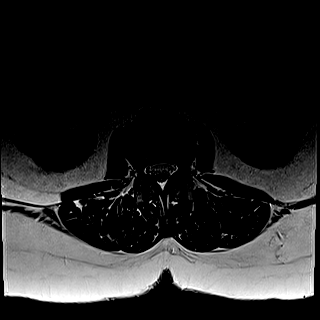
[im 28/33]
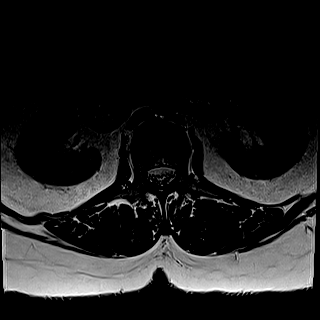
[im 33/33]
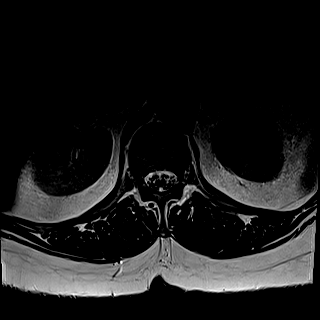

[Series 9: T1 · axial · 4.0mm · 0.39mm/px · z∈[-107,+71]mm · 8 of 33 slices shown (2 of 2)]
[im 1/33]
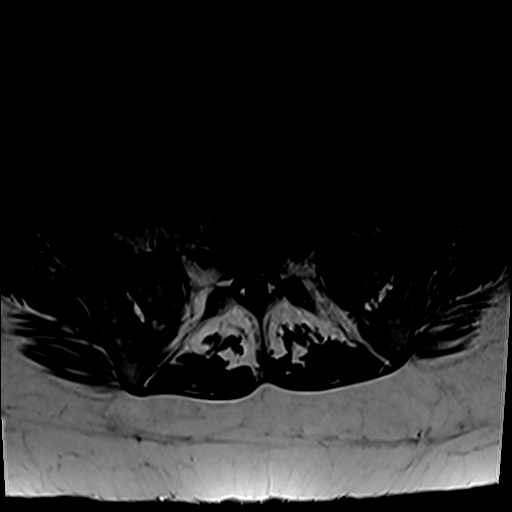
[im 5/33]
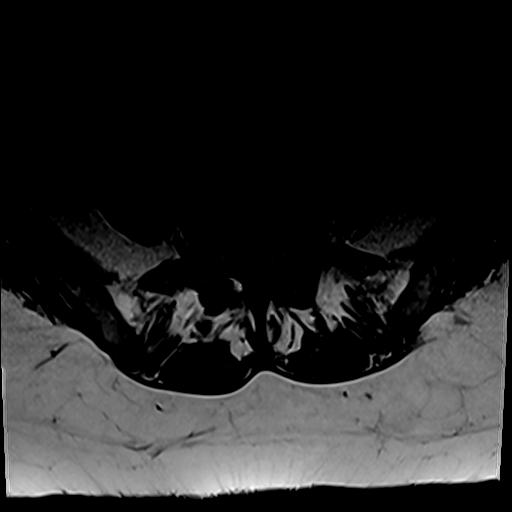
[im 10/33]
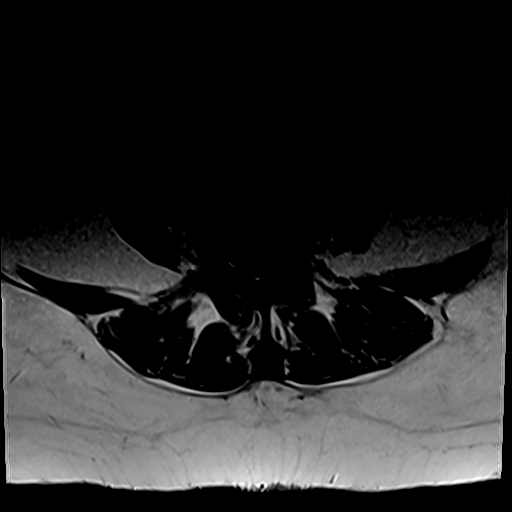
[im 15/33]
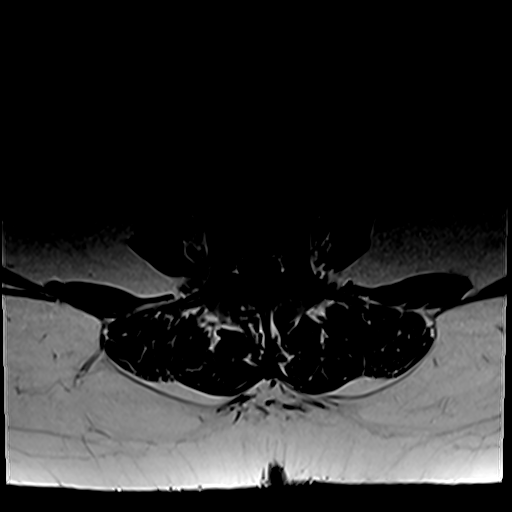
[im 18/33]
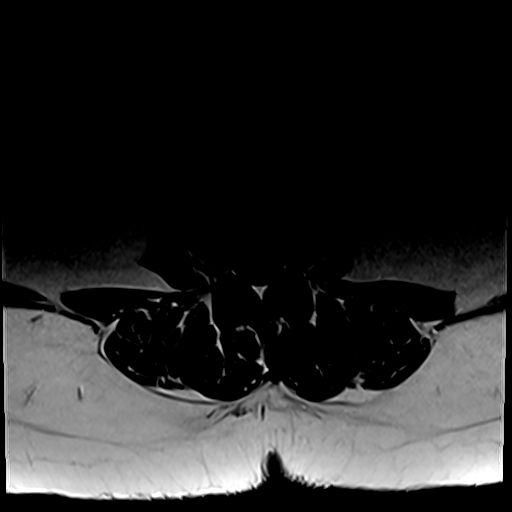
[im 23/33]
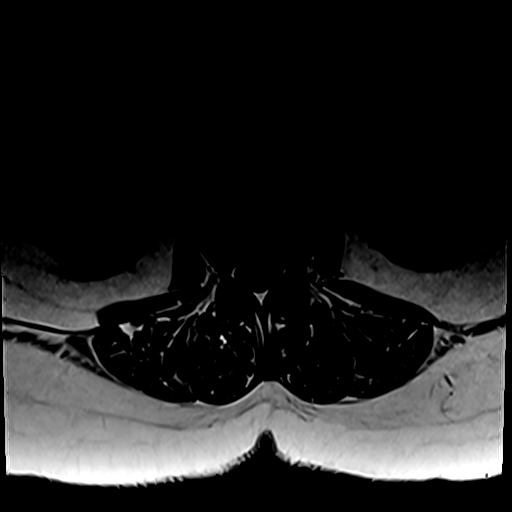
[im 28/33]
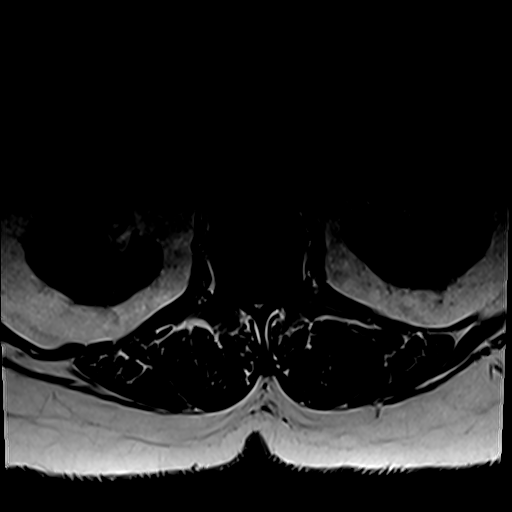
[im 33/33]
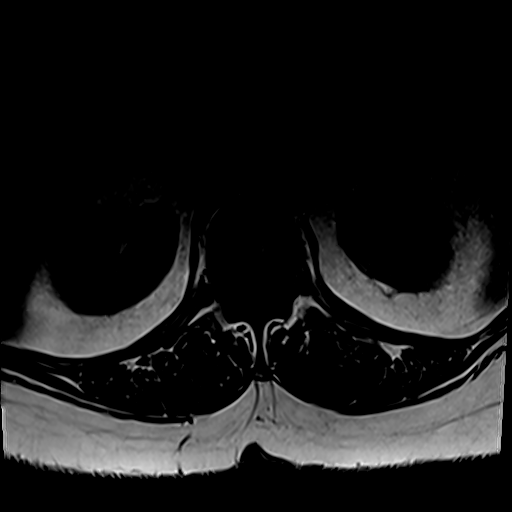

[31 of 48 positions shown; findings below may reference images not displayed]

FINDINGS: Segmentation: Transitional lumbosacral anatomy. In correlation with
prior imaging, there are 12 rib-bearing thoracic type vertebral
bodies and a transitional, largely sacralized L5 segment. This
numbering differs from that applied to the previous MRI.

Alignment: 2 mm of retrolisthesis at L4-5, unchanged from previous
study.

Vertebrae: No worrisome osseous lesion, acute fracture or pars
defect. The lumbar pedicles are somewhat short on a developmental
basis. The visualized sacroiliac joints appear unremarkable.

Conus medullaris: Extends to the T12-L1 level and appears normal.

Paraspinal and other soft tissues: No significant paraspinal
findings.

Disc levels:

There are no significant disc space findings from T11-12 through
L2-3. Mild bilateral facet hypertrophy at L2-3.

L3-4: Mild loss of disc height with mild disc bulging. Moderate
facet and ligamentous hypertrophy. Borderline spinal stenosis with
mild narrowing of the lateral recesses. The foramina are patent. No
definite nerve root encroachment or significant change.

L4-5: Stable mild disc bulging, facet and ligamentous hypertrophy.
Stable mild left greater than right foraminal narrowing and mild
narrowing of both lateral recesses. No definite nerve root
encroachment.

L5-S1: As numbered, this is a transitional disc space level without
acquired abnormality.
IMPRESSION: 1. Transitional lumbosacral anatomy. As numbered, there is a
transitional, largely sacralized L5 segment.
2. Stable mild disc bulging, facet and ligamentous hypertrophy at
L4-5 with resulting mild left greater than right foraminal narrowing
and mild narrowing of both lateral recesses.
3. Borderline spinal stenosis at L3-4 with mild narrowing of the
lateral recesses.
4. No acute findings or significant change from previous MRI.

## 2020-02-02 MED ORDER — HYDROCODONE-ACETAMINOPHEN 5-325 MG PO TABS: 2.0000 | ORAL_TABLET | Freq: Four times a day (QID) | ORAL | 0 refills | Status: DC | PRN

## 2020-02-02 MED ORDER — OXYCODONE-ACETAMINOPHEN 5-325 MG PO TABS
1.0000 | ORAL_TABLET | Freq: Once | ORAL | Status: AC
  Administered 2020-02-02: 1 via ORAL
  Filled 2020-02-02: qty 1

## 2020-02-02 MED ORDER — HYDROMORPHONE HCL 1 MG/ML IJ SOLN
1.0000 mg | Freq: Once | INTRAMUSCULAR | Status: AC
  Administered 2020-02-02: 1 mg via INTRAVENOUS
  Filled 2020-02-02: qty 1

## 2020-02-02 MED ORDER — ONDANSETRON HCL 4 MG/2ML IJ SOLN: 4.0000 mg | Freq: Once | INTRAMUSCULAR | Status: DC

## 2020-02-02 NOTE — ED Notes (Signed)
An After Visit Summary was printed and given to the patient. Discharge instructions given and no further questions at this time.  

## 2020-02-02 NOTE — ED Provider Notes (Signed)
[facility]-EMERGENCY DEPT Provider Note   CSN: [id number] Arrival date & time: 02/02/20  1025     History Chief Complaint  Patient presents with   Back Pain    Kerry Young is a 54 y.o. female.  HPI   Patient presents to the emergency with chief complaint of lower right back pain has been going for 10 years but this week she is experience excruciating back pain. She describes her back pain as burning/electrical shocklike pain that is constant. The pain shoots down her right leg and she feels numbness and tingling between her thighs and is complaining of weakness in her right leg. She explains that she was incontinent this morning she had excruciating pain and she urinated on herself. She was recently seen at general surgery where she got a epidural on Monday and since then she has had this excruciating pain. She denies headache, fever, neck pain, nausea, vomiting. She denies any alleviating factors, admits that moving makes the pain much worse. She denies taking any medication for it, she has sent a medical history of spondylosis of the lumbar, anxiety, sciatica. She denies headache, fever, chills, shortness of breath, chest pain, nausea, vomiting, diarrhea, dysuria, pedal edema.  Past Medical History:  Diagnosis Date   Anxiety    Migraines    PONV (postoperative nausea and vomiting)    PTSD (post-traumatic stress disorder)    Sciatica    UTI (urinary tract infection)     There are no problems to display for this patient.   Past Surgical History:  Procedure Laterality Date   COLPOSCOPY N/A 10/26/2019   Procedure: COLPOSCOPY with cervical biopsy;  Surgeon: Myna Hidalgo, DO;  Location: [facility];  Service: Gynecology;  Laterality: N/A;   WISDOM TOOTH EXTRACTION       OB History   No obstetric history on file.     Family History  Problem Relation Age of Onset   Anxiety disorder Mother    Hypertension Mother    Kidney  failure Mother    Heart attack Paternal Grandfather    Healthy Daughter     Social History   Tobacco Use   Smoking status: Former Smoker    Packs/day: 0.15    Types: Cigarettes    Quit date: 07/11/2018    Years since quitting: 1.5   Smokeless tobacco: Never Used  Vaping Use   Vaping Use: Never used  Substance Use Topics   Alcohol use: Not Currently    Comment: 1 per year maybe.   Drug use: No    Home Medications Prior to Admission medications   Medication Sig Start Date End Date Taking? Authorizing Provider  ALPRAZolam Prudy Feeler) 0.5 MG tablet Take 1 tablet (0.5 mg total) by mouth 4 (four) times daily as needed for anxiety. 01/04/20   Melony Overly T, PA-C  atorvastatin (LIPITOR) 20 MG tablet Take 1 tablet by mouth once daily 09/18/19   [provider]  busPIRone (BUSPAR) 15 MG tablet Take 1 tablet (15 mg total) by mouth 3 (three) times daily. 01/04/20   Cherie Ouch, PA-C  diphenhydrAMINE (BENADRYL) 25 MG tablet Take 25 mg by mouth 3 (three) times daily as needed for allergies.    [provider]  HYDROcodone-acetaminophen (NORCO/VICODIN) 5-325 MG tablet Take 2 tablets by mouth every 6 (six) hours as needed. 02/02/20   Carroll Sage, PA-C  ibuprofen (ADVIL,MOTRIN) 200 MG tablet Take 600 mg by mouth every 8 (eight) hours as needed for headache.  [provider]  montelukast (SINGULAIR) 10 MG tablet Take 10 mg by mouth daily. 10/05/19   [provider]  pregabalin (LYRICA) 300 MG capsule  10/02/19   [provider]  tiZANidine (ZANAFLEX) 4 MG tablet Take by mouth. 07/07/19   [provider]    Allergies    Celexa [citalopram], Ondansetron hcl, Other, Oxycodone-acetaminophen, and Percocet [oxycodone-acetaminophen]  Review of Systems   Review of Systems  Constitutional: Negative for chills and fever.  HENT: Negative for congestion, sore throat and tinnitus.   Eyes: Negative for pain and visual disturbance.    Respiratory: Negative for cough and shortness of breath.   Cardiovascular: Negative for chest pain, palpitations and leg swelling.  Gastrointestinal: Negative for abdominal pain, diarrhea, nausea and vomiting.  Genitourinary: Negative for dyspareunia, dysuria, enuresis and pelvic pain.       Incontinent  Musculoskeletal: Positive for back pain. Negative for neck pain and neck stiffness.       Admits to lower right back pain that radiates down her right leg.  Skin: Negative for rash.  Neurological: Positive for weakness. Negative for dizziness and headaches.       Weakness of the right leg.  Hematological: Does not bruise/bleed easily.    Physical Exam Updated Vital Signs BP 110/76    Pulse 77    Temp 98 F (36.7 C) (Oral)    Resp 18    SpO2 98%   Physical Exam Vitals and nursing note reviewed. Exam conducted with a chaperone present.  Constitutional:      General: She is not in acute distress.    Appearance: She is not ill-appearing.  HENT:     Head: Normocephalic and atraumatic.     Nose: No congestion.     Mouth/Throat:     Mouth: Mucous membranes are moist.     Pharynx: Oropharynx is clear.  Eyes:     General: No scleral icterus. Cardiovascular:     Rate and Rhythm: Normal rate and regular rhythm.     Pulses: Normal pulses.     Heart sounds: No murmur heard.  No friction rub. No gallop.   Pulmonary:     Effort: No respiratory distress.     Breath sounds: No wheezing, rhonchi or rales.  Abdominal:     General: There is no distension.     Tenderness: There is no abdominal tenderness. There is no guarding.  Genitourinary:    Rectum: Normal.     Comments: Patient's rectal tone was normal. Musculoskeletal:        General: No swelling, tenderness, deformity or signs of injury.     Right lower leg: No edema.     Left lower leg: No edema.     Comments: Patient spine was visualized there is no rash, edema, abrasions, lacerations or other gross abnormalities noted. Spine  was palpated, tender to palpation along her lumbar spine, there is no step-off noted. Patient had decreased range of motion on her right hip flexor due to pain, she had 4 out of 5 strength in her hip flexor and knee due to pain. She had good capillary refill, good pedal pulses, sensory fully intact. But she admits to paresthesias in between her thighs.  Skin:    General: Skin is warm and dry.     Capillary Refill: Capillary refill takes less than 2 seconds.     Findings: No rash.  Neurological:     Mental Status: She is alert and oriented to person, place, and  time.  Psychiatric:        Mood and Affect: Mood normal.     ED Results / Procedures / Treatments   Labs (all labs ordered are listed, but only abnormal results are displayed) Labs Reviewed - No data to display  EKG None  Radiology MR LUMBAR SPINE WO CONTRAST  Result Date: 02/02/2020 CLINICAL DATA:  Chronic low back pain. Increased pain today radiating into the right thigh. Cauda equina syndrome suspected. EXAM: MRI LUMBAR SPINE WITHOUT CONTRAST TECHNIQUE: Multiplanar, multisequence MR imaging of the lumbar spine was performed. No intravenous contrast was administered. COMPARISON:  Abdominopelvic CT 02/07/2018. Lumbar MRI 02/02/2019. Prior chest radiographs. FINDINGS: Segmentation: Transitional lumbosacral anatomy. In correlation with prior imaging, there are 12 rib-bearing thoracic type vertebral bodies and a transitional, largely sacralized L5 segment. This numbering differs from that applied to the previous MRI. Alignment: 2 mm of retrolisthesis at L4-5, unchanged from previous study. Vertebrae: No worrisome osseous lesion, acute fracture or pars defect. The lumbar pedicles are somewhat short on a developmental basis. The visualized sacroiliac joints appear unremarkable. Conus medullaris: Extends to the T12-L1 level and appears normal. Paraspinal and other soft tissues: No significant paraspinal findings. Disc levels: There are no  significant disc space findings from T11-12 through L2-3. Mild bilateral facet hypertrophy at L2-3. L3-4: Mild loss of disc height with mild disc bulging. Moderate facet and ligamentous hypertrophy. Borderline spinal stenosis with mild narrowing of the lateral recesses. The foramina are patent. No definite nerve root encroachment or significant change. L4-5: Stable mild disc bulging, facet and ligamentous hypertrophy. Stable mild left greater than right foraminal narrowing and mild narrowing of both lateral recesses. No definite nerve root encroachment. L5-S1: As numbered, this is a transitional disc space level without acquired abnormality. IMPRESSION: 1. Transitional lumbosacral anatomy. As numbered, there is a transitional, largely sacralized L5 segment. 2. Stable mild disc bulging, facet and ligamentous hypertrophy at L4-5 with resulting mild left greater than right foraminal narrowing and mild narrowing of both lateral recesses. 3. Borderline spinal stenosis at L3-4 with mild narrowing of the lateral recesses. 4. No acute findings or significant change from previous MRI. Electronically Signed   By: Carey Bullocks M.D.   On: 02/02/2020 13:56    Procedures Procedures (including critical care time)  Medications Ordered in ED Medications  oxyCODONE-acetaminophen (PERCOCET/ROXICET) 5-325 MG per tablet 1 tablet (has no administration in time range)  HYDROmorphone (DILAUDID) injection 1 mg (1 mg Intravenous Given 02/02/20 1244)    ED Course  I have reviewed the triage vital signs and the nursing notes.  Pertinent labs & imaging results that were available during my care of the patient were reviewed by me and considered in my medical decision making (see chart for details).  Clinical Course as of Feb 01 1437  Fri Feb 02, 2020  [id number] 54 year old with chronic back pain here with worsening back pain after getting injections by her pain management doctor at [facility].  Radiates down her right leg.  That is  her typical symptoms.  She said she is incontinent of urine once.  Getting pain medication and MRI.   [MB]    Clinical Course User Index [MB] Terrilee Files, MD   MDM Rules/Calculators/A&P                          I have personally reviewed all imaging, labs and have interpreted them.  Due to patient's complaint most concern for spinal equina versus herniated  disc versus septic arthritis.  Unlikely patient suffering from septic arthritis as she denies IV drug use, denies rash, fever, physical exam was benign did not see track marks, rashes or red hot swollen joints.  Due to concern for spinal equina will send patient for MRI of lumbar spine to rule out spinal equina herniated disc.  Rectal tone was examined and was normal.  Patient's MRI does not show acute findings or significant change from previous MRI, show stable mild disc bulging, facet and ligamentous hypertrophy at L4 and 5 with borderline spinal stenosis at L3-L4 with mild narrowing of the lateral recesses.  Patient was reassessed after given pain meds and states she is feeling better.  Patient is nontoxic-appearing, vital signs remained stable further imaging and lab work were not indicated.  Patient appears to be resting comfortably in bed showing no acute signs distress.  Vital signs have remained stable does not meet criteria to be admitted to the hospital.  Likely patient suffering from disc bulging at L4 and L5 as well as spinal stenosis at L3 and 4 which is causing back pain.  She will be prescribed Norco and recommend that she follows up with her spinal doctor for further evaluation management.  Patient was discussed with attending who agrees with assessment and plan.  Patient was given at home care as well as strict return precautions.  Patient verbalized that she understood and agrees with said plan.   Final Clinical Impression(s) / ED Diagnoses Final diagnoses:  Chronic right-sided low back pain, unspecified whether sciatica  present    Rx / DC Orders ED Discharge Orders         Ordered    HYDROcodone-acetaminophen (NORCO/VICODIN) 5-325 MG tablet  Every 6 hours PRN,   Status:  Discontinued     Reprint     02/02/20 1410    HYDROcodone-acetaminophen (NORCO/VICODIN) 5-325 MG tablet  Every 6 hours PRN     Discontinue  Reprint     02/02/20 1423           Barnie Del 02/02/20 1438    Terrilee Files, MD 02/02/20 1810

## 2020-02-02 NOTE — Discharge Instructions (Signed)
You have been seen here for lower back pain.  Imaging and exam look reassuring.  I prescribed you pain meds please take as prescribed.  Do not drink alcohol or operate heavy machinery while taking this medication as it can cause you become very drowsy.  You may take ibuprofen with this medication as this can help with inflammation and swelling. you may take it every 6 hours please follow dosage and on back of bottle.  Do not take Tylenol while taking this pain medicine as it contains Tylenol in it.  You may also apply ice to the area as this can help with pain.  I recommend that you follow-up with Dr. Marye Round as you need further management and evaluation.  I want to come back to emergency department if develop incontinence,lose control of bowel movements, numbness or tingling in your legs, inability to move your legs, chest pain, shortness of breath, uncontrolled nausea, vomiting, diarrhea as the symptoms require further evaluation and management.

## 2020-02-02 NOTE — ED Triage Notes (Signed)
Per EMS- patient is from home. patient c/o chronic back pain. Patient states she had an epidural for pain control, but today the pain is abnormally worse. Patient c/o pain right lower back/hip that radiates into the right thigh.

## 2020-02-13 ENCOUNTER — Ambulatory Visit: Payer: 59 | Admitting: Physician Assistant

## 2020-02-28 ENCOUNTER — Other Ambulatory Visit: Payer: Self-pay | Admitting: Physician Assistant

## 2020-03-01 NOTE — Telephone Encounter (Signed)
Kerry Young called stating she needs refills on Xanax and Buspar. Next appt is Tuesday.

## 2020-03-05 ENCOUNTER — Other Ambulatory Visit: Payer: Self-pay

## 2020-03-05 ENCOUNTER — Ambulatory Visit: Payer: 59 | Admitting: Physician Assistant

## 2020-03-06 ENCOUNTER — Encounter: Payer: Self-pay | Admitting: Physician Assistant

## 2020-03-06 ENCOUNTER — Other Ambulatory Visit: Payer: Self-pay

## 2020-03-06 ENCOUNTER — Ambulatory Visit (INDEPENDENT_AMBULATORY_CARE_PROVIDER_SITE_OTHER): Payer: 59 | Admitting: Physician Assistant

## 2020-03-06 DIAGNOSIS — F320 Major depressive disorder, single episode, mild: Secondary | ICD-10-CM

## 2020-03-06 DIAGNOSIS — G4700 Insomnia, unspecified: Secondary | ICD-10-CM | POA: Diagnosis not present

## 2020-03-06 DIAGNOSIS — F411 Generalized anxiety disorder: Secondary | ICD-10-CM

## 2020-03-06 DIAGNOSIS — F32A Depression, unspecified: Secondary | ICD-10-CM

## 2020-03-06 MED ORDER — ALPRAZOLAM 0.5 MG PO TABS: ORAL_TABLET | ORAL | 2 refills | Status: DC

## 2020-03-06 MED ORDER — BUSPIRONE HCL 15 MG PO TABS: 15.0000 mg | ORAL_TABLET | Freq: Three times a day (TID) | ORAL | 2 refills | Status: DC

## 2020-03-06 NOTE — Progress Notes (Signed)
[facility] Med Check  Patient ID: Kerry Young,  MRN: [id number]  PCP: Ileana Ladd, MD  Date of Evaluation: 03/06/2020 Time spent:20 minutes  Chief Complaint:  Chief Complaint    Anxiety; Follow-up      HISTORY/CURRENT STATUS: HPI For routine med check.   At the last visit, we increased the Xanax frequency, and increased the BuSpar as well. States she is doing better with the anxiety. She still needs the Xanax on a daily basis but has found it very helpful when she has more panicky situations. When she is triggered, she definitely needs it. The BuSpar has helped after increasing the dose.  Patient denies loss of interest in usual activities and is able to enjoy things.  Denies decreased energy or motivation.  Appetite has not changed.  No extreme sadness, tearfulness, or feelings of hopelessness. No change in memory or, concentration. Sleeps good most of the time.  Denies suicidal or homicidal thoughts.   Patient denies increased energy with decreased need for sleep, no increased talkativeness, no racing thoughts, no impulsivity or risky behaviors, no increased spending, no increased libido, no grandiosity, no increased irritability or anger, no paranoia, and no hallucinations.  Denies dizziness, syncope, seizures, numbness, tingling, tremor, tics, unsteady gait, slurred speech, confusion. Continues to have chronic back pain. See review of systems. Individual Medical History/ Review of Systems: Changes? :Yes   had an epidural within the last month, and had an experience about 2 days afterwards where she had severe pain bilat LE, and urinated on herself, went to ER had MRI that showed no spinal cord damage, but sciatica. Is still seeing pain management, Dr. Laurian Brim with [facility].   Past medications for mental health diagnoses include: Zoloft, mirtazapine, Trintellix, Viibryd, Cymbalta, trazodone, Xanax, Paxil, Seroquel, Klonopin, Ambien without relief, Lunesta no improvement,  Celexa caused n/v and H/A  Allergies: Celexa [citalopram], Ondansetron hcl, Other, Oxycodone-acetaminophen, and Percocet [oxycodone-acetaminophen]  Current Medications:  Current Outpatient Medications:    ALPRAZolam (XANAX) 0.5 MG tablet, TAKE 1 TABLET BY MOUTH 4 TIMES DAILY AS NEEDED FOR ANXIETY, Disp: 120 tablet, Rfl: 2   atorvastatin (LIPITOR) 20 MG tablet, Take 1 tablet by mouth once daily, Disp: , Rfl:    busPIRone (BUSPAR) 15 MG tablet, Take 1 tablet (15 mg total) by mouth 3 (three) times daily., Disp: 90 tablet, Rfl: 2   ibuprofen (ADVIL,MOTRIN) 200 MG tablet, Take 600 mg by mouth every 8 (eight) hours as needed for headache., Disp: , Rfl:    montelukast (SINGULAIR) 10 MG tablet, Take 10 mg by mouth daily., Disp: , Rfl:    pregabalin (LYRICA) 300 MG capsule, , Disp: , Rfl:    tiZANidine (ZANAFLEX) 4 MG tablet, Take by mouth., Disp: , Rfl:    diphenhydrAMINE (BENADRYL) 25 MG tablet, Take 25 mg by mouth 3 (three) times daily as needed for allergies., Disp: , Rfl:    HYDROcodone-acetaminophen (NORCO/VICODIN) 5-325 MG tablet, Take 2 tablets by mouth every 6 (six) hours as needed. (Patient not taking: Reported on 03/06/2020), Disp: 10 tablet, Rfl: 0 Medication Side Effects: none  Family Medical/ Social History: Changes? No  MENTAL HEALTH EXAM:  There were no vitals taken for this visit.There is no height or weight on file to calculate BMI.  General Appearance: Casual, Neat, Well Groomed and Obese  Eye Contact:  Good  Speech:  Clear and Coherent and Normal Rate  Volume:  Normal  Mood:  Euthymic  Affect:  Appropriate  Thought Process:  Goal Directed and Descriptions of  Associations: Intact  Orientation:  Full (Time, Place, and Person)  Thought Content: Logical   Suicidal Thoughts:  No  Homicidal Thoughts:  No  Memory:  WNL  Judgement:  Good  Insight:  Good  Psychomotor Activity:  Normal  Concentration:  Concentration: Good and Attention Span: Good  Recall:  Good  Fund  of Knowledge: Good  Language: Good  Assets:  Desire for Improvement  ADL's:  Intact  Cognition: WNL  Prognosis:  Good    DIAGNOSES:    ICD-10-CM   1. 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 Insomnia, unspecified type  G47.00     Receiving Psychotherapy: No    RECOMMENDATIONS:  PDMP reviewed. I provided 20 minutes of face-to-face care during this encounter. I'm glad to see her doing better! Continue BuSpar 15 mg, 1 p.o. 3 times daily. Continue Xanax 0.5 mg 1 p.o. 4 times daily as needed.   Return in 3 months.  Melony Overly, PA-C

## 2020-05-05 ENCOUNTER — Encounter (HOSPITAL_COMMUNITY): Payer: Self-pay

## 2020-05-05 ENCOUNTER — Other Ambulatory Visit: Payer: Self-pay

## 2020-05-05 ENCOUNTER — Emergency Department (HOSPITAL_COMMUNITY)
Admission: EM | Admit: 2020-05-05 | Discharge: 2020-05-05 | Disposition: A | Discharge status: Home / Self Care | Payer: 59 | Attending: Emergency Medicine | Admitting: Emergency Medicine

## 2020-05-05 DIAGNOSIS — R197 Diarrhea, unspecified: Secondary | ICD-10-CM | POA: Diagnosis not present

## 2020-05-05 DIAGNOSIS — R109 Unspecified abdominal pain: Secondary | ICD-10-CM | POA: Insufficient documentation

## 2020-05-05 DIAGNOSIS — R519 Headache, unspecified: Secondary | ICD-10-CM | POA: Diagnosis not present

## 2020-05-05 DIAGNOSIS — R112 Nausea with vomiting, unspecified: Secondary | ICD-10-CM | POA: Insufficient documentation

## 2020-05-05 DIAGNOSIS — Z87891 Personal history of nicotine dependence: Secondary | ICD-10-CM | POA: Insufficient documentation

## 2020-05-05 LAB — CBC
HCT: 47 % — ABNORMAL HIGH (ref 36.0–46.0)
Hemoglobin: 15.5 g/dL — ABNORMAL HIGH (ref 12.0–15.0)
MCH: 30.3 pg (ref 26.0–34.0)
MCHC: 33 g/dL (ref 30.0–36.0)
MCV: 91.8 fL (ref 80.0–100.0)
Platelets: 390 10*3/uL (ref 150–400)
RBC: 5.12 MIL/uL — ABNORMAL HIGH (ref 3.87–5.11)
RDW: 13.6 % (ref 11.5–15.5)
WBC: 9.8 10*3/uL (ref 4.0–10.5)
nRBC: 0 % (ref 0.0–0.2)

## 2020-05-05 LAB — COMPREHENSIVE METABOLIC PANEL
ALT: 35 U/L (ref 0–44)
AST: 24 U/L (ref 15–41)
Albumin: 3.8 g/dL (ref 3.5–5.0)
Alkaline Phosphatase: 77 U/L (ref 38–126)
Anion gap: 9 (ref 5–15)
BUN: 9 mg/dL (ref 6–20)
CO2: 23 mmol/L (ref 22–32)
Calcium: 9 mg/dL (ref 8.9–10.3)
Chloride: 104 mmol/L (ref 98–111)
Creatinine, Ser: 0.73 mg/dL (ref 0.44–1.00)
GFR, Estimated: >60 mL/min (ref >60)
Glucose, Bld: 98 mg/dL (ref 70–99)
Potassium: 4.3 mmol/L (ref 3.5–5.1)
Sodium: 136 mmol/L (ref 135–145)
Total Bilirubin: 0.7 mg/dL (ref 0.3–1.2)
Total Protein: 7.1 g/dL (ref 6.5–8.1)

## 2020-05-05 LAB — LIPASE, BLOOD: Lipase: 24 U/L (ref 11–51)

## 2020-05-05 MED ORDER — FAMOTIDINE IN NACL 20-0.9 MG/50ML-% IV SOLN
20.0000 mg | Freq: Once | INTRAVENOUS | Status: AC
  Administered 2020-05-05: 20 mg via INTRAVENOUS
  Filled 2020-05-05: qty 50

## 2020-05-05 MED ORDER — LORAZEPAM 2 MG/ML IJ SOLN
1.0000 mg | Freq: Once | INTRAMUSCULAR | Status: AC
  Administered 2020-05-05: 1 mg via INTRAVENOUS
  Filled 2020-05-05: qty 1

## 2020-05-05 MED ORDER — SODIUM CHLORIDE 0.9 % IV BOLUS
1000.0000 mL | Freq: Once | INTRAVENOUS | Status: AC
  Administered 2020-05-05: 1000 mL via INTRAVENOUS

## 2020-05-05 MED ORDER — ACETAMINOPHEN 500 MG PO TABS
1000.0000 mg | ORAL_TABLET | Freq: Once | ORAL | Status: AC
  Administered 2020-05-05: 1000 mg via ORAL
  Filled 2020-05-05: qty 2

## 2020-05-05 NOTE — ED Triage Notes (Signed)
N/V/D/Mid abd pain tender to palpitation/Headache since last night. Gallbladder and appendix still intact. Patient received fentanyl and 2.5mg  haldol and NS with ems

## 2020-05-05 NOTE — ED Notes (Signed)
Pt tolerated liquids well. No nausea/vomiting noted. Pt states she feels well enough to go home. Provider made aware

## 2020-05-05 NOTE — ED Provider Notes (Signed)
[facility]-EMERGENCY DEPT Provider Note   CSN: [id number] Arrival date & time: 05/05/20  1658     History Chief Complaint  Patient presents with  . Nausea  . Diarrhea  . Headache    Kerry Young is a 54 y.o. female.  Patient c/o nausea, vomiting and diarrhea acute onset last pm - symptoms acute onset, moderate, episodic, recurrent, persistent. Notes several episodes of both vomiting and diarrhea. No bloody or bilious emesis. Diarrhea is watery, not bloody. Mid abdominal crampy discomfort, comes and goes. No known bad food ingestion, ill contacts, or recent change in meds/new meds or abx use. No constant/focal pain. No abd distension. Denies prior abd surgery. No hx pud, pancreatitis or gallstones. No gu c/o. No back or flank pain. Also notes feels like is getting migraine, noting hx of same. Dull frontal headache, mild-moderate. No fever or chills.   The history is provided by the patient.  Diarrhea Associated symptoms: abdominal pain, headaches and vomiting   Associated symptoms: no chills and no fever   Headache Associated symptoms: abdominal pain, diarrhea and vomiting   Associated symptoms: no back pain, no cough, no fever, no neck pain and no sore throat        Past Medical History:  Diagnosis Date  . Anxiety   . Migraines   . PONV (postoperative nausea and vomiting)   . PTSD (post-traumatic stress disorder)   . Sciatica   . UTI (urinary tract infection)     There are no problems to display for this patient.   Past Surgical History:  Procedure Laterality Date  . COLPOSCOPY N/A 10/26/2019   Procedure: COLPOSCOPY with cervical biopsy;  Surgeon: Myna Hidalgo, DO;  Location: [facility];  Service: Gynecology;  Laterality: N/A;  . WISDOM TOOTH EXTRACTION       OB History   No obstetric history on file.     Family History  Problem Relation Age of Onset  . Anxiety disorder Mother   . Hypertension Mother   . Kidney failure  Mother   . Heart attack Paternal Grandfather   . Healthy Daughter     Social History   Tobacco Use  . Smoking status: Former Smoker    Packs/day: 0.15    Types: Cigarettes    Quit date: 07/11/2018    Years since quitting: 1.8  . Smokeless tobacco: Never Used  Vaping Use  . Vaping Use: Never used  Substance Use Topics  . Alcohol use: Not Currently    Comment: 1 per year maybe.  . Drug use: No    Home Medications Prior to Admission medications   Medication Sig Start Date End Date Taking? Authorizing Provider  ALPRAZolam Prudy Feeler) 0.5 MG tablet TAKE 1 TABLET BY MOUTH 4 TIMES DAILY AS NEEDED FOR ANXIETY 03/06/20   Melony Overly T, PA-C  atorvastatin (LIPITOR) 20 MG tablet Take 1 tablet by mouth once daily 09/18/19   [provider]  busPIRone (BUSPAR) 15 MG tablet Take 1 tablet (15 mg total) by mouth 3 (three) times daily. 03/06/20   Cherie Ouch, PA-C  diphenhydrAMINE (BENADRYL) 25 MG tablet Take 25 mg by mouth 3 (three) times daily as needed for allergies.    [provider]  HYDROcodone-acetaminophen (NORCO/VICODIN) 5-325 MG tablet Take 2 tablets by mouth every 6 (six) hours as needed. Patient not taking: Reported on 03/06/2020 02/02/20   Carroll Sage, PA-C  ibuprofen (ADVIL,MOTRIN) 200 MG tablet Take 600 mg by mouth every 8 (eight)  hours as needed for headache.    [provider]  montelukast (SINGULAIR) 10 MG tablet Take 10 mg by mouth daily. 10/05/19   [provider]  pregabalin (LYRICA) 300 MG capsule  10/02/19   [provider]  tiZANidine (ZANAFLEX) 4 MG tablet Take by mouth. 07/07/19   [provider]    Allergies    Celexa [citalopram], Ondansetron hcl, Other, Oxycodone-acetaminophen, and Percocet [oxycodone-acetaminophen]  Review of Systems   Review of Systems  Constitutional: Negative for chills and fever.  HENT: Negative for sinus pain and sore throat.   Eyes: Negative for redness.  Respiratory: Negative for  cough and shortness of breath.   Cardiovascular: Negative for chest pain.  Gastrointestinal: Positive for abdominal pain, diarrhea and vomiting.  Endocrine: Negative for polyuria.  Genitourinary: Negative for dysuria, flank pain, vaginal bleeding and vaginal discharge.       Pt indicates is post-menopausal.  Musculoskeletal: Negative for back pain and neck pain.  Skin: Negative for rash.  Neurological: Positive for headaches.  Hematological: Does not bruise/bleed easily.  Psychiatric/Behavioral: Negative for confusion.    Physical Exam Updated Vital Signs BP 127/89 (BP Location: Left Wrist)   Pulse 79   Temp 97.8 F (36.6 C) (Oral)   Resp 16   Ht 1.575 m (5\' 2" )   Wt 94.5 kg   SpO2 98%   BMI 38.10 kg/m   Physical Exam Vitals and nursing note reviewed.  Constitutional:      Appearance: Normal appearance. She is well-developed.  HENT:     Head: Atraumatic.     Nose: Nose normal.     Mouth/Throat:     Mouth: Mucous membranes are moist.  Eyes:     General: No scleral icterus.    Conjunctiva/sclera: Conjunctivae normal.     Pupils: Pupils are equal, round, and reactive to light.  Neck:     Trachea: No tracheal deviation.  Cardiovascular:     Rate and Rhythm: Normal rate and regular rhythm.     Pulses: Normal pulses.     Heart sounds: Normal heart sounds. No murmur heard.  No friction rub. No gallop.   Pulmonary:     Effort: Pulmonary effort is normal. No respiratory distress.     Breath sounds: Normal breath sounds.  Abdominal:     General: Bowel sounds are normal. There is no distension.     Palpations: Abdomen is soft. There is no mass.     Tenderness: There is no abdominal tenderness. There is no guarding or rebound.     Hernia: No hernia is present.  Genitourinary:    Comments: No cva tenderness.  Musculoskeletal:        General: No swelling.     Cervical back: Normal range of motion and neck supple. No rigidity. No muscular tenderness.  Skin:    General:  Skin is warm and dry.     Findings: No rash.  Neurological:     Mental Status: She is alert.     Comments: Alert, speech normal.   Psychiatric:        Mood and Affect: Mood normal.     ED Results / Procedures / Treatments   Labs (all labs ordered are listed, but only abnormal results are displayed) Results for orders placed or performed during the hospital encounter of 05/05/20  CBC  Result Value Ref Range   WBC 9.8 4.0 - 10.5 K/uL   RBC 5.12 (H) 3.87 - 5.11 MIL/uL   Hemoglobin 15.5 (H)  12.0 - 15.0 g/dL   HCT 70.6 (H) 36 - 46 %   MCV 91.8 80.0 - 100.0 fL   MCH 30.3 26.0 - 34.0 pg   MCHC 33.0 30.0 - 36.0 g/dL   RDW 23.7 62.8 - 31.5 %   Platelets 390 150 - 400 K/uL   nRBC 0.0 0.0 - 0.2 %  Comprehensive metabolic panel  Result Value Ref Range   Sodium 136 135 - 145 mmol/L   Potassium 4.3 3.5 - 5.1 mmol/L   Chloride 104 98 - 111 mmol/L   CO2 23 22 - 32 mmol/L   Glucose, Bld 98 70 - 99 mg/dL   BUN 9 6 - 20 mg/dL   Creatinine, Ser 1.76 0.44 - 1.00 mg/dL   Calcium 9.0 8.9 - 16.0 mg/dL   Total Protein 7.1 6.5 - 8.1 g/dL   Albumin 3.8 3.5 - 5.0 g/dL   AST 24 15 - 41 U/L   ALT 35 0 - 44 U/L   Alkaline Phosphatase 77 38 - 126 U/L   Total Bilirubin 0.7 0.3 - 1.2 mg/dL   GFR, Estimated >73 >71 mL/min   Anion gap 9 5 - 15  Lipase, blood  Result Value Ref Range   Lipase 24 11 - 51 U/L     EKG None  Radiology No results found.  Procedures Procedures (including critical care time)  Medications Ordered in ED Medications  sodium chloride 0.9 % bolus 1,000 mL (has no administration in time range)  famotidine (PEPCID) IVPB 20 mg premix (has no administration in time range)  LORazepam (ATIVAN) injection 1 mg (has no administration in time range)    ED Course  I have reviewed the triage vital signs and the nursing notes.  Pertinent labs & imaging results that were available during my care of the patient were reviewed by me and considered in my medical decision making  (see chart for details).    MDM Rules/Calculators/A&P                          Iv ns bolus. pepcid iv. Pt with hx anxiety, appears anxious, takes bzd medication. Ativan 1 mg iv.   Reviewed nursing notes and prior charts for additional history.   Labs reviewed/interpreted by me - chem normal.   Acetaminophen po.  Recheck pt comfortable, and is requesting d/c. Pt is tolerating po fluids with no recurrent nausea, vomiting, diarrhea, or abdominal pain.   Return precautions provided.      Final Clinical Impression(s) / ED Diagnoses Final diagnoses:  None    Rx / DC Orders ED Discharge Orders    None       Cathren Laine, MD 05/05/20 1943

## 2020-05-05 NOTE — Discharge Instructions (Addendum)
It was our pleasure to provide your ER care today - we hope that you feel better.  Rest. Drink plenty of fluids.   Follow up with primary care doctor in 1-2 days if symptoms fail to improve/resolve.  Return to ER if worse, new symptoms, fevers, new or severe pain, persistent vomiting, or other concern.   You were given medication in the ER - no driving for the next 6 hours.

## 2020-05-27 ENCOUNTER — Telehealth: Payer: Self-pay | Admitting: Psychiatry

## 2020-05-27 DIAGNOSIS — S92353A Displaced fracture of fifth metatarsal bone, unspecified foot, initial encounter for closed fracture: Secondary | ICD-10-CM | POA: Insufficient documentation

## 2020-05-27 NOTE — Telephone Encounter (Signed)
No

## 2020-05-27 NOTE — Telephone Encounter (Signed)
Kerry Young called and she had a fall and injured her foot and toes and is in a cast. She just got her Xanax refilled last week. She has dropped her bottle of Xanax behind her big dresser and she cannot pull the dresser out to get the bottle. Her boyfriend is out of town and cant help her and she doesn't have any neighbors who can assist her with this. She is asking if she can get a refill on her Xanax called in to [facility] on [address] in [place]. I told her you would make the call on this. Her next appointment is 12/20.

## 2020-05-28 ENCOUNTER — Telehealth: Payer: Self-pay | Admitting: Physician Assistant

## 2020-05-28 ENCOUNTER — Telehealth: Payer: 59 | Admitting: Physician Assistant

## 2020-05-28 DIAGNOSIS — F411 Generalized anxiety disorder: Secondary | ICD-10-CM

## 2020-05-28 NOTE — Progress Notes (Signed)
Patient called for emergent appointment.  Appointment given to be worked in, but when this provider called, patient did not answer.  I left a message for her to call back to reschedule.

## 2020-05-28 NOTE — Telephone Encounter (Signed)
Advise patient anything else?

## 2020-05-28 NOTE — Telephone Encounter (Signed)
When will her BF be back in town to get xanax pills that have fallen the dresser? I'll send in a few pills, no more than 10, so.

## 2020-05-28 NOTE — Telephone Encounter (Signed)
Pt called back asking that she can talk to someone about her xanax refill. Pt said that she is starting to get sick from not taking it. I told her that it was denied yesterday and probably still denied today.

## 2020-05-29 NOTE — Telephone Encounter (Signed)
Patient is aware 

## 2020-05-30 ENCOUNTER — Other Ambulatory Visit: Payer: Self-pay | Admitting: Physician Assistant

## 2020-05-30 ENCOUNTER — Telehealth: Payer: Self-pay | Admitting: Physician Assistant

## 2020-05-30 MED ORDER — ALPRAZOLAM 0.5 MG PO TABS: 0.5000 mg | ORAL_TABLET | Freq: Three times a day (TID) | ORAL | 0 refills | Status: DC | PRN

## 2020-05-30 NOTE — Telephone Encounter (Signed)
Prescription for #15 was seeing them.

## 2020-05-30 NOTE — Telephone Encounter (Signed)
Spoke to patient today, she said boyfriend is a truck driver so it will be at least a month till he returns. She reports she fell down some steps on Monday and has fractured some toes and part of the foot. She is currently in a boot and will be reassessed by ortho if needs surgery.   Patient is feeling pretty sick, she's been advised to stay in bed due to fracture in foot. She said to thank [name] for sending some in.  She uses Psychologist, forensic on her chart.

## 2020-05-30 NOTE — Telephone Encounter (Signed)
Kerry Young called and said the pharmacy will not fill her Xanax until a provider or nurse calls to give approval for them to fill it. I told her it would be done tomorrow morning.

## 2020-05-31 NOTE — Telephone Encounter (Signed)
Spoke with pharmacist at 9 am this morning and gave approval

## 2020-05-31 NOTE — Telephone Encounter (Signed)
Please call and approve it. Thanks

## 2020-05-31 NOTE — Telephone Encounter (Signed)
Shelbie

## 2020-06-07 ENCOUNTER — Ambulatory Visit: Payer: 59 | Admitting: Physician Assistant

## 2020-06-10 DIAGNOSIS — F17200 Nicotine dependence, unspecified, uncomplicated: Secondary | ICD-10-CM | POA: Insufficient documentation

## 2020-06-11 ENCOUNTER — Other Ambulatory Visit: Payer: Self-pay | Admitting: Physician Assistant

## 2020-06-12 NOTE — Telephone Encounter (Signed)
Next apt 12/20

## 2020-06-18 ENCOUNTER — Telehealth: Payer: Self-pay | Admitting: Physician Assistant

## 2020-06-18 NOTE — Telephone Encounter (Signed)
Pt picked up her RF on Xanax. She is questioning why she only got #15 pills and not her usual monthly supply. It is hard for her to get to the pharmacy due to broken foot. Please call to tell her why.

## 2020-06-18 NOTE — Telephone Encounter (Signed)
Those 15 pills were what I sent in after her bottle of Xanax fell behind the dresser.  Not sure why she did not pick them up already.  If it is time for her month supply then she can have it filled.

## 2020-06-18 NOTE — Telephone Encounter (Signed)
Please review

## 2020-06-19 ENCOUNTER — Telehealth: Payer: Self-pay | Admitting: Physician Assistant

## 2020-06-19 ENCOUNTER — Other Ambulatory Visit: Payer: Self-pay | Admitting: Physician Assistant

## 2020-06-19 MED ORDER — ALPRAZOLAM 0.5 MG PO TABS: ORAL_TABLET | ORAL | 2 refills | Status: DC

## 2020-06-19 NOTE — Telephone Encounter (Signed)
Patient requesting her monthly supply.  Last 2 Rx's filled were #15

## 2020-06-19 NOTE — Telephone Encounter (Signed)
Rx was sent in. 

## 2020-06-19 NOTE — Telephone Encounter (Signed)
Kerry Young called and said it is time for her regular month supply of Xanax.  Appt 12/20.  Send to [facility] on [address]

## 2020-07-08 ENCOUNTER — Telehealth (INDEPENDENT_AMBULATORY_CARE_PROVIDER_SITE_OTHER): Payer: 59 | Admitting: Physician Assistant

## 2020-07-08 ENCOUNTER — Encounter: Payer: Self-pay | Admitting: Physician Assistant

## 2020-07-08 ENCOUNTER — Telehealth: Payer: Self-pay | Admitting: Physician Assistant

## 2020-07-08 DIAGNOSIS — G4700 Insomnia, unspecified: Secondary | ICD-10-CM

## 2020-07-08 DIAGNOSIS — F32A Depression, unspecified: Secondary | ICD-10-CM | POA: Insufficient documentation

## 2020-07-08 DIAGNOSIS — F411 Generalized anxiety disorder: Secondary | ICD-10-CM | POA: Insufficient documentation

## 2020-07-08 DIAGNOSIS — F320 Major depressive disorder, single episode, mild: Secondary | ICD-10-CM

## 2020-07-08 MED ORDER — MIRTAZAPINE 15 MG PO TABS: 7.5000 mg | ORAL_TABLET | Freq: Every day | ORAL | 1 refills | Status: DC

## 2020-07-08 MED ORDER — BUSPIRONE HCL 15 MG PO TABS: 15.0000 mg | ORAL_TABLET | Freq: Three times a day (TID) | ORAL | 5 refills | Status: DC

## 2020-07-08 NOTE — Telephone Encounter (Signed)
[name], donovan are scheduled for a virtual visit with your provider today.    Just as we do with appointments in the office, we must obtain your consent to participate.  Your consent will be active for this visit and any virtual visit you may have with one of our providers in the next 365 days.    If you have a MyChart account, I can also send a copy of this consent to you electronically.  All virtual visits are billed to your insurance company just like a traditional visit in the office.  As this is a virtual visit, video technology does not allow for your provider to perform a traditional examination.  This may limit your provider's ability to fully assess your condition.  If your provider identifies any concerns that need to be evaluated in person or the need to arrange testing such as labs, EKG, etc, we will make arrangements to do so.    Although advances in technology are sophisticated, we cannot ensure that it will always work on either your end or our end.  If the connection with a video visit is poor, we may have to switch to a telephone visit.  With either a video or telephone visit, we are not always able to ensure that we have a secure connection.   I need to obtain your verbal consent now.   Are you willing to proceed with your visit today?   Kerry Young has provided verbal consent on 07/08/2020 for a virtual visit (video or telephone).   Melony Overly, PA-C 07/08/2020  12:24 PM

## 2020-07-08 NOTE — Progress Notes (Signed)
[facility] Med Check  Patient ID: Kerry Young,  MRN: [id number]  PCP: Ileana Ladd, MD  Date of Evaluation: 07/08/2020 Time spent:30 minutes  Chief Complaint:  Chief Complaint    Anxiety; Follow-up     Virtual Visit via Telehealth  I connected with patient by telephone, with their informed consent, and verified patient privacy and that I am speaking with the correct person using two identifiers.  I am private, in my office and the patient is at home.  I discussed the limitations, risks, security and privacy concerns of performing an evaluation and management service by telephone and the availability of in person appointments. I also discussed with the patient that there may be a patient responsible charge related to this service. The patient expressed understanding and agreed to proceed.   I discussed the assessment and treatment plan with the patient. The patient was provided an opportunity to ask questions and all were answered. The patient agreed with the plan and demonstrated an understanding of the instructions.   The patient was advised to call back or seek an in-person evaluation if the symptoms worsen or if the condition fails to improve as anticipated.  I provided 30 minutes of non-face-to-face time during this encounter.  HISTORY/CURRENT STATUS: HPI For routine med check.  Is very anxious right now. Just had surgery.  See ROS.  States she feels like up "ball of nerves."  It is worse in the evening, when her mind races and she has a hard time falling asleep.  It is just hard because she is unable to do the things that she wants and needs to do.  She is not really having panic attacks, just more of a sense of unease.  She does have some depression symptoms as well.  But feels like a lot of it is due to the anxiety.  Her energy is low because of the recent surgery and she is on bed rest.  She feels sad mostly because of the situation.  Not crying easily.  No suicidal or  homicidal thoughts.  Continues to have chronic pain plus the acute pain of her foot.  She is still seeing pain management, on long-term opioids, which is helpful.  Patient denies increased energy with decreased need for sleep, no increased talkativeness, no racing thoughts, no impulsivity or risky behaviors, no increased spending, no increased libido, no grandiosity, no increased irritability or anger, and no hallucinations.  Denies dizziness, syncope, seizures, numbness, tingling, tremor, tics, slurred speech, confusion.   Individual Medical History/ Review of Systems: Changes? :Yes  Had foot surgery 12/7, after fracture from falling down a step. Is still on bed rest.    Allergies: Pregabalin, Celexa [citalopram], Naproxen, Ondansetron hcl, Other, Oxycodone-acetaminophen, and Percocet [oxycodone-acetaminophen]   Past medications for mental health diagnoses include: Zoloft, mirtazapine, Trintellix, Viibryd, Cymbalta, trazodone, Xanax, Paxil, Seroquel, Klonopin, Ambien without relief, Lunesta no improvement, Celexa caused n/v and H/A   Current Medications:  Current Outpatient Medications:  .  ALPRAZolam (XANAX) 0.5 MG tablet, TAKE 1 TABLET BY MOUTH 4 TIMES DAILY AS NEEDED FOR ANXIETY, Disp: 120 tablet, Rfl: 2 .  atorvastatin (LIPITOR) 20 MG tablet, Take 1 tablet by mouth once daily, Disp: , Rfl:  .  diphenhydrAMINE (BENADRYL) 25 MG tablet, Take 25 mg by mouth 3 (three) times daily as needed for allergies., Disp: , Rfl:  .  HYDROcodone-acetaminophen (NORCO/VICODIN) 5-325 MG tablet, Take 2 tablets by mouth every 6 (six) hours as needed., Disp: 10 tablet, Rfl: 0 .  ibuprofen (ADVIL,MOTRIN) 200 MG tablet, Take 600 mg by mouth every 8 (eight) hours as needed for headache., Disp: , Rfl:  .  montelukast (SINGULAIR) 10 MG tablet, Take 10 mg by mouth daily., Disp: , Rfl:  .  pregabalin (LYRICA) 300 MG capsule, , Disp: , Rfl:  .  tiZANidine (ZANAFLEX) 4 MG tablet, Take by mouth., Disp: , Rfl:  .   busPIRone (BUSPAR) 15 MG tablet, Take 1 tablet (15 mg total) by mouth 3 (three) times daily., Disp: 90 tablet, Rfl: 5 .  mirtazapine (REMERON) 15 MG tablet, Take 0.5-1 tablets (7.5-15 mg total) by mouth at bedtime., Disp: 30 tablet, Rfl: 1 Medication Side Effects: none  Family Medical/ Social History: Changes? Yes on bed rest  MENTAL HEALTH EXAM:  There were no vitals taken for this visit.There is no height or weight on file to calculate BMI.  General Appearance: unable to assess  Eye Contact:  unable to assess  Speech:  Clear and Coherent and Normal Rate  Volume:  Normal  Mood:  Depressed  Affect:  unable to assess  Thought Process:  Goal Directed and Descriptions of Associations: Intact  Orientation:  Full (Time, Place, and Person)  Thought Content: Logical   Suicidal Thoughts:  No  Homicidal Thoughts:  No  Memory:  WNL  Judgement:  Good  Insight:  Good  Psychomotor Activity:  unable to assess  Concentration:  Concentration: Good and Attention Span: Good  Recall:  Good  Fund of Knowledge: Good  Language: Good  Assets:  Desire for Improvement  ADL's:  Intact  Cognition: WNL  Prognosis:  Good    DIAGNOSES:    ICD-10-CM   1. 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 Insomnia, unspecified type  G47.00     Receiving Psychotherapy: No    RECOMMENDATIONS:  PDMP was reviewed. I provided 30 minutes of nonface-to-face time on this telephone encounter, discussing her recent surgery, her symptoms in relation to the fact that she is bedbound and essentially isolating due to her physical problems.   We discussed sleep hygiene, however those recommendations such as getting off of electronic devices 2 hours before sleep, are difficult to follow right now because she is bedbound.  Definitely stop caffeine after lunch.  Discussed either using mirtazapine or trazodone as a sleep aid but will also help depression, but usually at higher doses than are  prescribed for insomnia.  She would like to retry 1 of those drugs.  She has taken them in the past but does not remember if they were effective or not.  She remembers no bad side effects from them.  We will start mirtazapine and benefits, risks, side effects were discussed and she accepts. Continue Xanax 0.5 mg, 1 p.o. 4 times daily as needed. Continue BuSpar 15 mg, 1 p.o. 3 times daily.  We discussed the possibility of increasing this to help with anxiety but she is afraid it would make her migraines worse so she prefers to leave that dose alone at this point. Start mirtazapine 15 mg, 1/2-1 p.o. nightly for sleep. Return in 6 weeks.  Melony Overly, PA-C

## 2020-08-21 ENCOUNTER — Other Ambulatory Visit: Payer: Self-pay | Admitting: Physician Assistant

## 2020-08-30 ENCOUNTER — Encounter: Payer: Self-pay | Admitting: Physician Assistant

## 2020-08-30 ENCOUNTER — Ambulatory Visit (INDEPENDENT_AMBULATORY_CARE_PROVIDER_SITE_OTHER): Payer: Medicaid Other | Admitting: Physician Assistant

## 2020-08-30 ENCOUNTER — Other Ambulatory Visit: Payer: Self-pay

## 2020-08-30 DIAGNOSIS — F411 Generalized anxiety disorder: Secondary | ICD-10-CM | POA: Diagnosis not present

## 2020-08-30 DIAGNOSIS — G4700 Insomnia, unspecified: Secondary | ICD-10-CM | POA: Diagnosis not present

## 2020-08-30 DIAGNOSIS — F320 Major depressive disorder, single episode, mild: Secondary | ICD-10-CM | POA: Diagnosis not present

## 2020-08-30 DIAGNOSIS — F32A Depression, unspecified: Secondary | ICD-10-CM

## 2020-08-30 MED ORDER — BUSPIRONE HCL 15 MG PO TABS: 15.0000 mg | ORAL_TABLET | Freq: Three times a day (TID) | ORAL | 5 refills | Status: DC

## 2020-08-30 MED ORDER — ALPRAZOLAM 1 MG PO TABS: 1.0000 mg | ORAL_TABLET | Freq: Three times a day (TID) | ORAL | 5 refills | Status: DC | PRN

## 2020-08-30 NOTE — Progress Notes (Signed)
[facility]  Patient ID: Kerry Young,  MRN: [id number]  PCP: Ileana Ladd, MD  Date of Evaluation: 08/30/2020 Time spent:20 minutes  Chief Complaint:  Chief Complaint    Anxiety; Depression; Insomnia      HISTORY/CURRENT STATUS: HPI For routine med check.  Having more anxiety, panic attacks with tachycardia occasionally, feels really nervous inside like something bad is about to happen.  Is having a hard time enjoying things mostly because of her physical health.  She is able to walk now with minimal weightbearing.  Energy and motivation are improving.  Not isolating, except she still has a hard time driving so cannot go any places, only when she has to.  No suicidal or homicidal thoughts.  Patient denies increased energy with decreased need for sleep, no increased talkativeness, no racing thoughts, no impulsivity or risky behaviors, no increased spending, no increased libido, no grandiosity, no increased irritability or anger, and no hallucinations.  Denies dizziness, syncope, seizures, numbness, tingling, tremor, tics, slurred speech, confusion.   Individual Medical History/ Review of Systems: Changes? :Yes  Had foot surgery 12/7, after fracture from falling down a step.   Allergies: Pregabalin, Celexa [citalopram], Naproxen, Ondansetron hcl, Other, Oxycodone-acetaminophen, and Percocet [oxycodone-acetaminophen]   Past medications for mental health diagnoses include: Zoloft, mirtazapine, Trintellix, Viibryd, Cymbalta, trazodone, Xanax, Paxil, Seroquel, Klonopin, Ambien without relief, Lunesta no improvement, Celexa caused n/v and H/A   Current Medications:  Current Outpatient Medications:  .  ALPRAZolam (XANAX) 1 MG tablet, Take 1 tablet (1 mg total) by mouth 3 (three) times daily as needed for anxiety., Disp: 90 tablet, Rfl: 5 .  diphenhydrAMINE (BENADRYL) 25 MG tablet, Take 25 mg by mouth 3 (three) times daily as needed for allergies., Disp: , Rfl:  .   HYDROcodone-acetaminophen (NORCO/VICODIN) 5-325 MG tablet, Take 2 tablets by mouth every 6 (six) hours as needed., Disp: 10 tablet, Rfl: 0 .  ibuprofen (ADVIL,MOTRIN) 200 MG tablet, Take 600 mg by mouth every 8 (eight) hours as needed for headache., Disp: , Rfl:  .  montelukast (SINGULAIR) 10 MG tablet, Take 10 mg by mouth daily., Disp: , Rfl:  .  pregabalin (LYRICA) 300 MG capsule, , Disp: , Rfl:  .  tiZANidine (ZANAFLEX) 4 MG tablet, Take by mouth., Disp: , Rfl:  .  atorvastatin (LIPITOR) 20 MG tablet, Take 1 tablet by mouth once daily (Patient not taking: Reported on 08/30/2020), Disp: , Rfl:  .  busPIRone (BUSPAR) 15 MG tablet, Take 1 tablet (15 mg total) by mouth 3 (three) times daily., Disp: 90 tablet, Rfl: 5 Medication Side Effects: none  Family Medical/ Social History: Changes?  No  MENTAL HEALTH EXAM:  There were no vitals taken for this visit.There is no height or weight on file to calculate BMI.  General Appearance: Casual, Neat, Well Groomed and Has a immobilizer boot on right foot to knee.  Eye Contact:  Good  Speech:  Clear and Coherent and Normal Rate  Volume:  Normal  Mood:  Euthymic  Affect:  Appropriate  Thought Process:  Goal Directed and Descriptions of Associations: Intact  Orientation:  Full (Time, Place, and Person)  Thought Content: Logical   Suicidal Thoughts:  No  Homicidal Thoughts:  No  Memory:  WNL  Judgement:  Good  Insight:  Good  Psychomotor Activity:  Walks slowly due to the immobilizer on the right foot.  Concentration:  Concentration: Good and Attention Span: Good  Recall:  Good  Fund of Knowledge: Good  Language:  Good  Assets:  Desire for Improvement  ADL's:  Intact  Cognition: WNL  Prognosis:  Good    DIAGNOSES:    ICD-10-CM   1. 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 Insomnia, unspecified type  G47.00     Receiving Psychotherapy: No    RECOMMENDATIONS:  PDMP was reviewed. I provided 20 minutes  of face-to-face time during this encounter, in which we discussed the anxiety.  In the past she has taken a higher dose of Xanax even up to 4 mg/day.  She is hesitant to go up on the BuSpar because of chronic headaches and is afraid they will worsen if she does go.  She is being worked up for migraines and has an MRI next week. Increase Xanax to 1 mg, 1 p.o. 3 times daily as needed. Continue BuSpar 15 mg, 1 p.o. 3 times daily.   Return in 6 months.  Melony Overly, PA-C

## 2021-02-04 ENCOUNTER — Emergency Department (HOSPITAL_COMMUNITY): Payer: Medicaid Other

## 2021-02-04 ENCOUNTER — Encounter (HOSPITAL_COMMUNITY): Payer: Self-pay | Admitting: General Surgery

## 2021-02-04 ENCOUNTER — Inpatient Hospital Stay (HOSPITAL_COMMUNITY)
Admission: EM | Admit: 2021-02-04 | Discharge: 2021-02-22 | DRG: 958 | Disposition: A | Discharge status: Skilled Nursing Facility | Payer: Medicaid Other | Attending: Surgery | Admitting: Surgery

## 2021-02-04 DIAGNOSIS — S52209A Unspecified fracture of shaft of unspecified ulna, initial encounter for closed fracture: Secondary | ICD-10-CM

## 2021-02-04 DIAGNOSIS — S82201A Unspecified fracture of shaft of right tibia, initial encounter for closed fracture: Secondary | ICD-10-CM

## 2021-02-04 DIAGNOSIS — S2249XA Multiple fractures of ribs, unspecified side, initial encounter for closed fracture: Secondary | ICD-10-CM

## 2021-02-04 DIAGNOSIS — Z09 Encounter for follow-up examination after completed treatment for conditions other than malignant neoplasm: Secondary | ICD-10-CM

## 2021-02-04 DIAGNOSIS — T1490XA Injury, unspecified, initial encounter: Principal | ICD-10-CM

## 2021-02-04 DIAGNOSIS — R609 Edema, unspecified: Secondary | ICD-10-CM

## 2021-02-04 DIAGNOSIS — S6992XA Unspecified injury of left wrist, hand and finger(s), initial encounter: Secondary | ICD-10-CM

## 2021-02-04 DIAGNOSIS — I7771 Dissection of carotid artery: Secondary | ICD-10-CM

## 2021-02-04 DIAGNOSIS — S42201A Unspecified fracture of upper end of right humerus, initial encounter for closed fracture: Secondary | ICD-10-CM

## 2021-02-04 DIAGNOSIS — Z419 Encounter for procedure for purposes other than remedying health state, unspecified: Secondary | ICD-10-CM

## 2021-02-04 DIAGNOSIS — S42209A Unspecified fracture of upper end of unspecified humerus, initial encounter for closed fracture: Secondary | ICD-10-CM | POA: Diagnosis present

## 2021-02-04 DIAGNOSIS — S82401A Unspecified fracture of shaft of right fibula, initial encounter for closed fracture: Secondary | ICD-10-CM

## 2021-02-04 DIAGNOSIS — S2243XS Multiple fractures of ribs, bilateral, sequela: Secondary | ICD-10-CM

## 2021-02-04 DIAGNOSIS — M79632 Pain in left forearm: Secondary | ICD-10-CM

## 2021-02-04 DIAGNOSIS — Y92411 Interstate highway as the place of occurrence of the external cause: Secondary | ICD-10-CM

## 2021-02-04 DIAGNOSIS — M549 Dorsalgia, unspecified: Secondary | ICD-10-CM | POA: Diagnosis present

## 2021-02-04 DIAGNOSIS — F1120 Opioid dependence, uncomplicated: Secondary | ICD-10-CM | POA: Diagnosis present

## 2021-02-04 DIAGNOSIS — S46011A Strain of muscle(s) and tendon(s) of the rotator cuff of right shoulder, initial encounter: Secondary | ICD-10-CM | POA: Diagnosis present

## 2021-02-04 DIAGNOSIS — S52202A Unspecified fracture of shaft of left ulna, initial encounter for closed fracture: Secondary | ICD-10-CM | POA: Diagnosis present

## 2021-02-04 DIAGNOSIS — G8929 Other chronic pain: Secondary | ICD-10-CM | POA: Diagnosis present

## 2021-02-04 DIAGNOSIS — M7989 Other specified soft tissue disorders: Secondary | ICD-10-CM | POA: Diagnosis not present

## 2021-02-04 DIAGNOSIS — S82431A Displaced oblique fracture of shaft of right fibula, initial encounter for closed fracture: Secondary | ICD-10-CM | POA: Diagnosis present

## 2021-02-04 DIAGNOSIS — Z20822 Contact with and (suspected) exposure to covid-19: Secondary | ICD-10-CM | POA: Diagnosis present

## 2021-02-04 DIAGNOSIS — Z7902 Long term (current) use of antithrombotics/antiplatelets: Secondary | ICD-10-CM | POA: Diagnosis not present

## 2021-02-04 DIAGNOSIS — S43431A Superior glenoid labrum lesion of right shoulder, initial encounter: Secondary | ICD-10-CM | POA: Diagnosis present

## 2021-02-04 DIAGNOSIS — S32018A Other fracture of first lumbar vertebra, initial encounter for closed fracture: Secondary | ICD-10-CM | POA: Diagnosis present

## 2021-02-04 DIAGNOSIS — S42291A Other displaced fracture of upper end of right humerus, initial encounter for closed fracture: Secondary | ICD-10-CM | POA: Diagnosis present

## 2021-02-04 DIAGNOSIS — S27322A Contusion of lung, bilateral, initial encounter: Secondary | ICD-10-CM | POA: Diagnosis present

## 2021-02-04 DIAGNOSIS — E871 Hypo-osmolality and hyponatremia: Secondary | ICD-10-CM | POA: Diagnosis not present

## 2021-02-04 DIAGNOSIS — S32028A Other fracture of second lumbar vertebra, initial encounter for closed fracture: Secondary | ICD-10-CM | POA: Diagnosis present

## 2021-02-04 DIAGNOSIS — Z79899 Other long term (current) drug therapy: Secondary | ICD-10-CM

## 2021-02-04 DIAGNOSIS — S32048A Other fracture of fourth lumbar vertebra, initial encounter for closed fracture: Secondary | ICD-10-CM | POA: Diagnosis present

## 2021-02-04 DIAGNOSIS — M79661 Pain in right lower leg: Secondary | ICD-10-CM | POA: Diagnosis present

## 2021-02-04 DIAGNOSIS — S82191A Other fracture of upper end of right tibia, initial encounter for closed fracture: Secondary | ICD-10-CM | POA: Diagnosis present

## 2021-02-04 DIAGNOSIS — S100XXA Contusion of throat, initial encounter: Secondary | ICD-10-CM | POA: Diagnosis present

## 2021-02-04 DIAGNOSIS — S32038A Other fracture of third lumbar vertebra, initial encounter for closed fracture: Secondary | ICD-10-CM | POA: Diagnosis present

## 2021-02-04 DIAGNOSIS — Z7982 Long term (current) use of aspirin: Secondary | ICD-10-CM | POA: Diagnosis not present

## 2021-02-04 DIAGNOSIS — S82251A Displaced comminuted fracture of shaft of right tibia, initial encounter for closed fracture: Principal | ICD-10-CM | POA: Diagnosis present

## 2021-02-04 DIAGNOSIS — S2243XA Multiple fractures of ribs, bilateral, initial encounter for closed fracture: Secondary | ICD-10-CM | POA: Diagnosis present

## 2021-02-04 DIAGNOSIS — D62 Acute posthemorrhagic anemia: Secondary | ICD-10-CM | POA: Diagnosis present

## 2021-02-04 DIAGNOSIS — K5900 Constipation, unspecified: Secondary | ICD-10-CM | POA: Diagnosis present

## 2021-02-04 DIAGNOSIS — S15092A Other specified injury of left carotid artery, initial encounter: Secondary | ICD-10-CM | POA: Diagnosis present

## 2021-02-04 LAB — URINALYSIS, ROUTINE W REFLEX MICROSCOPIC
Bilirubin Urine: NEGATIVE
Glucose, UA: NEGATIVE mg/dL
Ketones, ur: 20 mg/dL — AB
Leukocytes,Ua: NEGATIVE
Nitrite: NEGATIVE
Protein, ur: NEGATIVE mg/dL
Specific Gravity, Urine: >1.046 — ABNORMAL HIGH (ref 1.005–1.030)
pH: 5 (ref 5.0–8.0)

## 2021-02-04 LAB — COMPREHENSIVE METABOLIC PANEL
ALT: 41 U/L (ref 0–44)
AST: 43 U/L — ABNORMAL HIGH (ref 15–41)
Albumin: 3.6 g/dL (ref 3.5–5.0)
Alkaline Phosphatase: 62 U/L (ref 38–126)
Anion gap: 9 (ref 5–15)
BUN: 13 mg/dL (ref 6–20)
CO2: 20 mmol/L — ABNORMAL LOW (ref 22–32)
Calcium: 8.8 mg/dL — ABNORMAL LOW (ref 8.9–10.3)
Chloride: 108 mmol/L (ref 98–111)
Creatinine, Ser: 0.93 mg/dL (ref 0.44–1.00)
GFR, Estimated: >60 mL/min (ref >60)
Glucose, Bld: 116 mg/dL — ABNORMAL HIGH (ref 70–99)
Potassium: 3.6 mmol/L (ref 3.5–5.1)
Sodium: 137 mmol/L (ref 135–145)
Total Bilirubin: 1.3 mg/dL — ABNORMAL HIGH (ref 0.3–1.2)
Total Protein: 6.8 g/dL (ref 6.5–8.1)

## 2021-02-04 LAB — PROTIME-INR
INR: 1 (ref 0.8–1.2)
Prothrombin Time: 13 seconds (ref 11.4–15.2)

## 2021-02-04 LAB — CBC
HCT: 42.6 % (ref 36.0–46.0)
Hemoglobin: 13.4 g/dL (ref 12.0–15.0)
MCH: 29.8 pg (ref 26.0–34.0)
MCHC: 31.5 g/dL (ref 30.0–36.0)
MCV: 94.9 fL (ref 80.0–100.0)
Platelets: 292 10*3/uL (ref 150–400)
RBC: 4.49 MIL/uL (ref 3.87–5.11)
RDW: 13.5 % (ref 11.5–15.5)
WBC: 15.6 10*3/uL — ABNORMAL HIGH (ref 4.0–10.5)
nRBC: 0 % (ref 0.0–0.2)

## 2021-02-04 LAB — SAMPLE TO BLOOD BANK

## 2021-02-04 LAB — RESP PANEL BY RT-PCR (FLU A&B, COVID) ARPGX2
Influenza A by PCR: NEGATIVE
Influenza B by PCR: NEGATIVE
SARS Coronavirus 2 by RT PCR: NEGATIVE

## 2021-02-04 LAB — I-STAT CHEM 8, ED
BUN: 15 mg/dL (ref 6–20)
Calcium, Ion: 1.13 mmol/L — ABNORMAL LOW (ref 1.15–1.40)
Chloride: 110 mmol/L (ref 98–111)
Creatinine, Ser: 0.8 mg/dL (ref 0.44–1.00)
Glucose, Bld: 107 mg/dL — ABNORMAL HIGH (ref 70–99)
HCT: 43 % (ref 36.0–46.0)
Hemoglobin: 14.6 g/dL (ref 12.0–15.0)
Potassium: 3.7 mmol/L (ref 3.5–5.1)
Sodium: 139 mmol/L (ref 135–145)
TCO2: 21 mmol/L — ABNORMAL LOW (ref 22–32)

## 2021-02-04 LAB — LACTIC ACID, PLASMA: Lactic Acid, Venous: 1.5 mmol/L (ref 0.5–1.9)

## 2021-02-04 LAB — ETHANOL: Alcohol, Ethyl (B): <10 mg/dL (ref <10)

## 2021-02-04 MED ORDER — ENOXAPARIN SODIUM 30 MG/0.3ML IJ SOSY
30.0000 mg | PREFILLED_SYRINGE | Freq: Two times a day (BID) | INTRAMUSCULAR | Status: DC
  Administered 2021-02-06 – 2021-02-22 (×31): 30 mg via SUBCUTANEOUS
  Filled 2021-02-04 (×31): qty 0.3

## 2021-02-04 MED ORDER — METHOCARBAMOL 750 MG PO TABS
750.0000 mg | ORAL_TABLET | Freq: Three times a day (TID) | ORAL | Status: DC | PRN
  Administered 2021-02-05: 750 mg via ORAL
  Filled 2021-02-04: qty 2

## 2021-02-04 MED ORDER — OXYCODONE HCL 5 MG PO TABS
5.0000 mg | ORAL_TABLET | ORAL | Status: DC | PRN
  Administered 2021-02-04 – 2021-02-05 (×2): 5 mg via ORAL
  Filled 2021-02-04 (×2): qty 1

## 2021-02-04 MED ORDER — PROCHLORPERAZINE EDISYLATE 10 MG/2ML IJ SOLN
5.0000 mg | Freq: Four times a day (QID) | INTRAMUSCULAR | Status: DC | PRN
  Administered 2021-02-06: 10 mg via INTRAVENOUS
  Filled 2021-02-04: qty 2

## 2021-02-04 MED ORDER — ASPIRIN EC 81 MG PO TBEC
81.0000 mg | DELAYED_RELEASE_TABLET | Freq: Every day | ORAL | Status: DC
  Administered 2021-02-04 – 2021-02-22 (×18): 81 mg via ORAL
  Filled 2021-02-04 (×19): qty 1

## 2021-02-04 MED ORDER — OXYCODONE HCL 5 MG PO TABS
10.0000 mg | ORAL_TABLET | ORAL | Status: DC | PRN
  Administered 2021-02-04 – 2021-02-06 (×6): 10 mg via ORAL
  Filled 2021-02-04 (×6): qty 2

## 2021-02-04 MED ORDER — IOHEXOL 350 MG/ML SOLN
100.0000 mL | Freq: Once | INTRAVENOUS | Status: AC | PRN
  Administered 2021-02-04: 100 mL via INTRAVENOUS

## 2021-02-04 MED ORDER — ACETAMINOPHEN 325 MG PO TABS
650.0000 mg | ORAL_TABLET | Freq: Four times a day (QID) | ORAL | Status: DC
Start: 2021-02-04 — End: 2021-02-05
  Administered 2021-02-04 – 2021-02-05 (×4): 650 mg via ORAL
  Filled 2021-02-04 (×3): qty 2

## 2021-02-04 MED ORDER — POTASSIUM CHLORIDE IN NACL 20-0.9 MEQ/L-% IV SOLN
INTRAVENOUS | Status: DC
  Filled 2021-02-04 (×12): qty 1000

## 2021-02-04 MED ORDER — PANTOPRAZOLE SODIUM 40 MG PO TBEC
40.0000 mg | DELAYED_RELEASE_TABLET | Freq: Every day | ORAL | Status: DC
  Administered 2021-02-06 – 2021-02-18 (×11): 40 mg via ORAL
  Filled 2021-02-04 (×11): qty 1

## 2021-02-04 MED ORDER — LIDOCAINE 5 % EX PTCH
1.0000 | MEDICATED_PATCH | CUTANEOUS | Status: DC
  Administered 2021-02-04: 1 via TRANSDERMAL
  Filled 2021-02-04: qty 1

## 2021-02-04 MED ORDER — PANTOPRAZOLE SODIUM 40 MG IV SOLR
40.0000 mg | Freq: Every day | INTRAVENOUS | Status: DC
  Administered 2021-02-04 – 2021-02-08 (×4): 40 mg via INTRAVENOUS
  Filled 2021-02-04 (×5): qty 40

## 2021-02-04 MED ORDER — PROCHLORPERAZINE MALEATE 10 MG PO TABS
10.0000 mg | ORAL_TABLET | Freq: Four times a day (QID) | ORAL | Status: DC | PRN
  Filled 2021-02-04: qty 1

## 2021-02-04 MED ORDER — CLOPIDOGREL BISULFATE 75 MG PO TABS
75.0000 mg | ORAL_TABLET | Freq: Every day | ORAL | Status: DC
  Administered 2021-02-04 – 2021-02-22 (×18): 75 mg via ORAL
  Filled 2021-02-04 (×18): qty 1

## 2021-02-04 MED ORDER — HYDROMORPHONE HCL 1 MG/ML IJ SOLN
1.0000 mg | INTRAMUSCULAR | Status: DC | PRN
  Administered 2021-02-04 – 2021-02-05 (×6): 1 mg via INTRAVENOUS
  Filled 2021-02-04 (×7): qty 1

## 2021-02-04 NOTE — ED Notes (Signed)
Pt placed on purewick 

## 2021-02-04 NOTE — Progress Notes (Signed)
Orthopedic Tech Progress Note Patient Details:  Kerry Young [birth year] [id number]  Ortho Devices Type of Ortho Device: Shoulder immobilizer, Long leg splint, Stirrup splint Ortho Device/Splint Location: RUE,RLE Ortho Device/Splint Interventions: Ordered, Application, Adjustment   Post Interventions Patient Tolerated: Well, Fair Instructions Provided: Care of device  [name] 02/04/2021, 11:42 AM

## 2021-02-04 NOTE — ED Notes (Signed)
Pt on the phone w boyfriend, updating on status & plan of care [name]: [phone]

## 2021-02-04 NOTE — Progress Notes (Signed)
   02/04/21 0945  Clinical Encounter Type  Visited With Patient not available  Visit Type Trauma  Referral From Nurse  Consult/Referral To Chaplain   The chaplain responded to the level 2 page. I spoke with Samara Deist who stated the patient is in Ct. There is no support person for the patient. I remain available if needed. This note was prepared by Deneen Harts, M.Div..  For questions please contact by phone [phone].
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
## 2021-02-04 NOTE — ED Notes (Addendum)
Pt crying out in pain, requesting additional pain medication, saying, "more, more, more, my R leg hurts so bad." Pt advised that medication had been given only one hour previous & more medication could not be given yet. Pt thrashing L leg around in bed, tearing & wailing. Pt advised by this RN that thrashing leg would not help R leg pain & that pt needed to be still & try to rest. Pt voiced understanding
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
## 2021-02-05 ENCOUNTER — Inpatient Hospital Stay (HOSPITAL_COMMUNITY): Payer: Medicaid Other

## 2021-02-05 ENCOUNTER — Encounter (HOSPITAL_COMMUNITY): Admission: EM | Disposition: A | Discharge status: Skilled Nursing Facility | Payer: Self-pay | Source: Home / Self Care

## 2021-02-05 ENCOUNTER — Encounter (HOSPITAL_COMMUNITY): Payer: Self-pay | Admitting: General Surgery

## 2021-02-05 ENCOUNTER — Inpatient Hospital Stay (HOSPITAL_COMMUNITY): Payer: Medicaid Other | Admitting: Certified Registered Nurse Anesthetist

## 2021-02-05 DIAGNOSIS — I7771 Dissection of carotid artery: Secondary | ICD-10-CM | POA: Diagnosis not present

## 2021-02-05 HISTORY — PX: TIBIA IM NAIL INSERTION: SHX2516

## 2021-02-05 LAB — CBC
HCT: 36.5 % (ref 36.0–46.0)
Hemoglobin: 11.7 g/dL — ABNORMAL LOW (ref 12.0–15.0)
MCH: 29.8 pg (ref 26.0–34.0)
MCHC: 32.1 g/dL (ref 30.0–36.0)
MCV: 92.9 fL (ref 80.0–100.0)
Platelets: 276 10*3/uL (ref 150–400)
RBC: 3.93 MIL/uL (ref 3.87–5.11)
RDW: 13.6 % (ref 11.5–15.5)
WBC: 10.4 10*3/uL (ref 4.0–10.5)
nRBC: 0 % (ref 0.0–0.2)

## 2021-02-05 LAB — BASIC METABOLIC PANEL
Anion gap: 5 (ref 5–15)
BUN: 8 mg/dL (ref 6–20)
CO2: 21 mmol/L — ABNORMAL LOW (ref 22–32)
Calcium: 8.4 mg/dL — ABNORMAL LOW (ref 8.9–10.3)
Chloride: 110 mmol/L (ref 98–111)
Creatinine, Ser: 0.69 mg/dL (ref 0.44–1.00)
GFR, Estimated: >60 mL/min (ref >60)
Glucose, Bld: 125 mg/dL — ABNORMAL HIGH (ref 70–99)
Potassium: 4.1 mmol/L (ref 3.5–5.1)
Sodium: 136 mmol/L (ref 135–145)

## 2021-02-05 LAB — MRSA NEXT GEN BY PCR, NASAL: MRSA by PCR Next Gen: NOT DETECTED

## 2021-02-05 LAB — HIV ANTIBODY (ROUTINE TESTING W REFLEX): HIV Screen 4th Generation wRfx: NONREACTIVE

## 2021-02-05 SURGERY — INSERTION, INTRAMEDULLARY ROD, TIBIA
Anesthesia: General | Laterality: Right

## 2021-02-05 MED ORDER — OXYCODONE HCL 5 MG/5ML PO SOLN
5.0000 mg | Freq: Once | ORAL | Status: AC | PRN
  Administered 2021-02-05: 5 mg via ORAL

## 2021-02-05 MED ORDER — PROMETHAZINE HCL 25 MG/ML IJ SOLN
INTRAMUSCULAR | Status: AC
  Filled 2021-02-05: qty 1

## 2021-02-05 MED ORDER — DEXAMETHASONE SODIUM PHOSPHATE 10 MG/ML IJ SOLN
INTRAMUSCULAR | Status: AC
  Filled 2021-02-05: qty 1

## 2021-02-05 MED ORDER — BUPIVACAINE HCL (PF) 0.25 % IJ SOLN
INTRAMUSCULAR | Status: AC
  Filled 2021-02-05: qty 30

## 2021-02-05 MED ORDER — PROMETHAZINE HCL 25 MG/ML IJ SOLN
6.2500 mg | INTRAMUSCULAR | Status: DC | PRN
  Administered 2021-02-05: 6.25 mg via INTRAVENOUS

## 2021-02-05 MED ORDER — HYDROMORPHONE HCL 1 MG/ML IJ SOLN
INTRAMUSCULAR | Status: AC
  Administered 2021-02-05: 0.5 mg via INTRAVENOUS
  Filled 2021-02-05: qty 1

## 2021-02-05 MED ORDER — KETOROLAC TROMETHAMINE 30 MG/ML IJ SOLN
INTRAMUSCULAR | Status: AC
  Filled 2021-02-05: qty 1

## 2021-02-05 MED ORDER — ACETAMINOPHEN 500 MG PO TABS
1000.0000 mg | ORAL_TABLET | Freq: Four times a day (QID) | ORAL | Status: DC
  Administered 2021-02-05 – 2021-02-11 (×22): 1000 mg via ORAL
  Filled 2021-02-05 (×23): qty 2

## 2021-02-05 MED ORDER — ACETAMINOPHEN 500 MG PO TABS: 1000.0000 mg | ORAL_TABLET | Freq: Once | ORAL | Status: DC

## 2021-02-05 MED ORDER — METHOCARBAMOL 500 MG PO TABS
1000.0000 mg | ORAL_TABLET | Freq: Three times a day (TID) | ORAL | Status: DC
Start: 2021-02-05 — End: 2021-02-06
  Administered 2021-02-05 – 2021-02-06 (×2): 1000 mg via ORAL
  Filled 2021-02-05 (×2): qty 2

## 2021-02-05 MED ORDER — 0.9 % SODIUM CHLORIDE (POUR BTL) OPTIME
TOPICAL | Status: DC | PRN
  Administered 2021-02-05: 1000 mL

## 2021-02-05 MED ORDER — KETAMINE HCL 50 MG/5ML IJ SOSY
PREFILLED_SYRINGE | INTRAMUSCULAR | Status: AC
  Filled 2021-02-05: qty 5

## 2021-02-05 MED ORDER — ALPRAZOLAM 0.5 MG PO TABS
1.0000 mg | ORAL_TABLET | Freq: Two times a day (BID) | ORAL | Status: DC | PRN
  Administered 2021-02-05 – 2021-02-22 (×32): 1 mg via ORAL
  Filled 2021-02-05 (×33): qty 2

## 2021-02-05 MED ORDER — KETOROLAC TROMETHAMINE 30 MG/ML IJ SOLN
30.0000 mg | Freq: Once | INTRAMUSCULAR | Status: AC | PRN
  Administered 2021-02-05: 30 mg via INTRAVENOUS

## 2021-02-05 MED ORDER — FENTANYL CITRATE (PF) 250 MCG/5ML IJ SOLN
INTRAMUSCULAR | Status: DC | PRN
  Administered 2021-02-05: 100 ug via INTRAVENOUS

## 2021-02-05 MED ORDER — ROCURONIUM BROMIDE 10 MG/ML (PF) SYRINGE
PREFILLED_SYRINGE | INTRAVENOUS | Status: DC | PRN
  Administered 2021-02-05: 100 mg via INTRAVENOUS

## 2021-02-05 MED ORDER — CHLORHEXIDINE GLUCONATE 0.12 % MT SOLN: 15.0000 mL | Freq: Once | OROMUCOSAL | Status: AC

## 2021-02-05 MED ORDER — CEFAZOLIN SODIUM-DEXTROSE 2-4 GM/100ML-% IV SOLN
2.0000 g | INTRAVENOUS | Status: AC
  Administered 2021-02-05: 2 g via INTRAVENOUS

## 2021-02-05 MED ORDER — HYDROMORPHONE HCL 1 MG/ML IJ SOLN
INTRAMUSCULAR | Status: AC
  Filled 2021-02-05: qty 1

## 2021-02-05 MED ORDER — HYDROMORPHONE HCL 1 MG/ML IJ SOLN
0.2500 mg | INTRAMUSCULAR | Status: DC | PRN
  Administered 2021-02-05 (×4): 0.5 mg via INTRAVENOUS

## 2021-02-05 MED ORDER — PHENYLEPHRINE HCL-NACL 10-0.9 MG/250ML-% IV SOLN
INTRAVENOUS | Status: DC | PRN
  Administered 2021-02-05: 40 ug/min via INTRAVENOUS

## 2021-02-05 MED ORDER — HYDROMORPHONE HCL 1 MG/ML IJ SOLN
1.0000 mg | INTRAMUSCULAR | Status: DC | PRN
  Administered 2021-02-05 – 2021-02-10 (×32): 1 mg via INTRAVENOUS
  Filled 2021-02-05 (×33): qty 1

## 2021-02-05 MED ORDER — POVIDONE-IODINE 10 % EX SWAB: 2.0000 "application " | Freq: Once | CUTANEOUS | Status: DC

## 2021-02-05 MED ORDER — FENTANYL CITRATE (PF) 250 MCG/5ML IJ SOLN
INTRAMUSCULAR | Status: AC
  Filled 2021-02-05: qty 5

## 2021-02-05 MED ORDER — PROPOFOL 10 MG/ML IV BOLUS
INTRAVENOUS | Status: AC
  Filled 2021-02-05: qty 20

## 2021-02-05 MED ORDER — AMISULPRIDE (ANTIEMETIC) 5 MG/2ML IV SOLN: 10.0000 mg | Freq: Once | INTRAVENOUS | Status: DC | PRN

## 2021-02-05 MED ORDER — PROPOFOL 10 MG/ML IV BOLUS
INTRAVENOUS | Status: DC | PRN
  Administered 2021-02-05: 150 mg via INTRAVENOUS

## 2021-02-05 MED ORDER — VANCOMYCIN HCL 1000 MG IV SOLR
INTRAVENOUS | Status: AC
  Filled 2021-02-05: qty 1000

## 2021-02-05 MED ORDER — MIDAZOLAM HCL 2 MG/2ML IJ SOLN
INTRAMUSCULAR | Status: AC
  Filled 2021-02-05: qty 2

## 2021-02-05 MED ORDER — DEXAMETHASONE SODIUM PHOSPHATE 10 MG/ML IJ SOLN
INTRAMUSCULAR | Status: DC | PRN
  Administered 2021-02-05: 10 mg via INTRAVENOUS

## 2021-02-05 MED ORDER — ACETAMINOPHEN 500 MG PO TABS
ORAL_TABLET | ORAL | Status: AC
  Filled 2021-02-05: qty 2

## 2021-02-05 MED ORDER — HYDROMORPHONE HCL 1 MG/ML IJ SOLN
0.5000 mg | Freq: Once | INTRAMUSCULAR | Status: AC
Start: 2021-02-05 — End: 2021-02-05

## 2021-02-05 MED ORDER — GABAPENTIN 300 MG PO CAPS
300.0000 mg | ORAL_CAPSULE | Freq: Three times a day (TID) | ORAL | Status: DC
  Administered 2021-02-05: 300 mg via ORAL
  Filled 2021-02-05: qty 1

## 2021-02-05 MED ORDER — PROMETHAZINE HCL 25 MG/ML IJ SOLN
INTRAMUSCULAR | Status: DC | PRN
  Administered 2021-02-05: 6.25 mg via INTRAVENOUS

## 2021-02-05 MED ORDER — HYDROCORTISONE NA SUCCINATE PF 100 MG IJ SOLR
INTRAMUSCULAR | Status: DC | PRN
  Administered 2021-02-05: 250 mg via INTRAVENOUS

## 2021-02-05 MED ORDER — MEPERIDINE HCL 25 MG/ML IJ SOLN: 6.2500 mg | INTRAMUSCULAR | Status: DC | PRN

## 2021-02-05 MED ORDER — DIPHENHYDRAMINE HCL 50 MG/ML IJ SOLN
INTRAMUSCULAR | Status: AC
  Filled 2021-02-05: qty 1

## 2021-02-05 MED ORDER — LACTATED RINGERS IV SOLN: INTRAVENOUS | Status: DC

## 2021-02-05 MED ORDER — CHLORHEXIDINE GLUCONATE 4 % EX LIQD
60.0000 mL | Freq: Once | CUTANEOUS | Status: DC
  Filled 2021-02-05: qty 15

## 2021-02-05 MED ORDER — CHLORHEXIDINE GLUCONATE 0.12 % MT SOLN
OROMUCOSAL | Status: AC
  Administered 2021-02-05: 15 mL
  Filled 2021-02-05: qty 15

## 2021-02-05 MED ORDER — ORAL CARE MOUTH RINSE: 15.0000 mL | Freq: Once | OROMUCOSAL | Status: AC

## 2021-02-05 MED ORDER — VANCOMYCIN HCL 1000 MG IV SOLR
INTRAVENOUS | Status: DC | PRN
  Administered 2021-02-05: 1000 mg via TOPICAL

## 2021-02-05 MED ORDER — DIPHENHYDRAMINE HCL 50 MG/ML IJ SOLN
INTRAMUSCULAR | Status: DC | PRN
  Administered 2021-02-05: 12.5 mg via INTRAVENOUS

## 2021-02-05 MED ORDER — ALBUMIN HUMAN 5 % IV SOLN: INTRAVENOUS | Status: DC | PRN

## 2021-02-05 MED ORDER — SUGAMMADEX SODIUM 200 MG/2ML IV SOLN
INTRAVENOUS | Status: DC | PRN
  Administered 2021-02-05: 200 mg via INTRAVENOUS

## 2021-02-05 MED ORDER — KETAMINE HCL 10 MG/ML IJ SOLN
INTRAMUSCULAR | Status: DC | PRN
  Administered 2021-02-05: 20 mg via INTRAVENOUS

## 2021-02-05 MED ORDER — OXYCODONE HCL 5 MG PO TABS: 5.0000 mg | ORAL_TABLET | Freq: Once | ORAL | Status: AC | PRN

## 2021-02-05 MED ORDER — OXYCODONE HCL 5 MG/5ML PO SOLN
ORAL | Status: AC
  Filled 2021-02-05: qty 5

## 2021-02-05 MED ORDER — ONDANSETRON HCL 4 MG/2ML IJ SOLN
INTRAMUSCULAR | Status: AC
  Filled 2021-02-05: qty 2

## 2021-02-05 MED ORDER — MIDAZOLAM HCL 5 MG/5ML IJ SOLN
INTRAMUSCULAR | Status: DC | PRN
  Administered 2021-02-05: 2 mg via INTRAVENOUS

## 2021-02-05 SURGICAL SUPPLY — 58 items
APL PRP STRL LF DISP 70% ISPRP (MISCELLANEOUS) ×1
BAG COUNTER SPONGE SURGICOUNT (BAG) IMPLANT
BAG SPNG CNTER NS LX DISP (BAG)
BIT DRILL 4.0X165 AO STYLE (BIT) ×1 IMPLANT
BIT DRILL 4.0X280 (BIT) ×1 IMPLANT
BNDG COHESIVE 4X5 TAN STRL (GAUZE/BANDAGES/DRESSINGS) ×2 IMPLANT
BNDG ELASTIC 6X5.8 VLCR STR LF (GAUZE/BANDAGES/DRESSINGS) ×2 IMPLANT
BNDG GAUZE ELAST 4 BULKY (GAUZE/BANDAGES/DRESSINGS) ×2 IMPLANT
CANISTER SUCT 3000ML PPV (MISCELLANEOUS) ×2 IMPLANT
CHLORAPREP W/TINT 26 (MISCELLANEOUS) ×2 IMPLANT
CLSR STERI-STRIP ANTIMIC 1/2X4 (GAUZE/BANDAGES/DRESSINGS) ×1 IMPLANT
COVER MAYO STAND STRL (DRAPES) ×2 IMPLANT
COVER SURGICAL LIGHT HANDLE (MISCELLANEOUS) ×2 IMPLANT
DRAPE C-ARM 42X72 X-RAY (DRAPES) ×2 IMPLANT
DRAPE C-ARMOR (DRAPES) ×2 IMPLANT
DRAPE IMP U-DRAPE 54X76 (DRAPES) ×2 IMPLANT
DRAPE U-SHAPE 47X51 STRL (DRAPES) ×2 IMPLANT
DRSG AQUACEL AG ADV 3.5X 6 (GAUZE/BANDAGES/DRESSINGS) ×2 IMPLANT
ELECT CAUTERY BLADE 6.4 (BLADE) ×2 IMPLANT
ELECT REM PT RETURN 9FT ADLT (ELECTROSURGICAL) ×2
ELECTRODE REM PT RTRN 9FT ADLT (ELECTROSURGICAL) ×1 IMPLANT
GAUZE SPONGE 4X4 12PLY STRL (GAUZE/BANDAGES/DRESSINGS) ×2 IMPLANT
GAUZE SPONGE 4X4 12PLY STRL LF (GAUZE/BANDAGES/DRESSINGS) ×1 IMPLANT
GAUZE XEROFORM 1X8 LF (GAUZE/BANDAGES/DRESSINGS) ×2 IMPLANT
GLOVE SRG 8 PF TXTR STRL LF DI (GLOVE) ×1 IMPLANT
GLOVE SURG ENC MOIS LTX SZ6.5 (GLOVE) ×2 IMPLANT
GLOVE SURG LTX SZ8 (GLOVE) ×2 IMPLANT
GLOVE SURG UNDER LTX SZ6.5 (GLOVE) ×2 IMPLANT
GLOVE SURG UNDER POLY LF SZ8 (GLOVE) ×2
GOWN STRL REUS W/ TWL LRG LVL3 (GOWN DISPOSABLE) ×2 IMPLANT
GOWN STRL REUS W/ TWL XL LVL3 (GOWN DISPOSABLE) ×1 IMPLANT
GOWN STRL REUS W/TWL LRG LVL3 (GOWN DISPOSABLE) ×4
GOWN STRL REUS W/TWL XL LVL3 (GOWN DISPOSABLE) ×2
GUIDEWIRE BALL NOSE 3.0X900 (WIRE) ×2
GUIDEWIRE ORTH 900X3XBALL NOSE (WIRE) IMPLANT
KIT BASIN OR (CUSTOM PROCEDURE TRAY) ×2 IMPLANT
KIT TURNOVER KIT B (KITS) ×2 IMPLANT
NAIL CANN TIB IM 9X130 (Nail) ×1 IMPLANT
NS IRRIG 1000ML POUR BTL (IV SOLUTION) ×2 IMPLANT
PACK ORTHO EXTREMITY (CUSTOM PROCEDURE TRAY) ×2 IMPLANT
PAD ABD 8X10 STRL (GAUZE/BANDAGES/DRESSINGS) ×1 IMPLANT
PAD ARMBOARD 7.5X6 YLW CONV (MISCELLANEOUS) ×4 IMPLANT
PIN GUIDE THRD AR 3.2X330 (PIN) ×1 IMPLANT
SCREW CORT LOCK 5.0X42 (Screw) ×1 IMPLANT
SCREW LOCK CORT 5.0X38 (Screw) ×1 IMPLANT
SCREW LOCK CORT 5.0X50 (Screw) ×1 IMPLANT
SCREW LOCK CORT 5X34 (Screw) ×1 IMPLANT
SCREW LOCK CORT 5X36 (Screw) ×1 IMPLANT
SPONGE T-LAP 18X18 ~~LOC~~+RFID (SPONGE) IMPLANT
STAPLER VISISTAT 35W (STAPLE) IMPLANT
SUT ETHILON 3 0 PS 1 (SUTURE) ×2 IMPLANT
SUT VIC AB 1 CT1 27 (SUTURE) ×2
SUT VIC AB 1 CT1 27XBRD ANBCTR (SUTURE) ×1 IMPLANT
SUT VIC AB 2-0 CT1 27 (SUTURE) ×2
SUT VIC AB 2-0 CT1 TAPERPNT 27 (SUTURE) ×1 IMPLANT
TUBE CONNECTING 12X1/4 (SUCTIONS) ×2 IMPLANT
WATER STERILE IRR 1000ML POUR (IV SOLUTION) ×2 IMPLANT
YANKAUER SUCT BULB TIP NO VENT (SUCTIONS) ×2 IMPLANT
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
## 2021-02-05 NOTE — Anesthesia Procedure Notes (Addendum)
Procedure Name: Intubation Date/Time: 02/05/2021 1:13 PM Performed by: Audie Pinto, CRNA Pre-anesthesia Checklist: Patient identified, Emergency Drugs available, Suction available and Patient being monitored Patient Re-evaluated:Patient Re-evaluated prior to induction Oxygen Delivery Method: Circle system utilized Preoxygenation: Pre-oxygenation with 100% oxygen Induction Type: IV induction Ventilation: Oral airway inserted - appropriate to patient size and Two handed mask ventilation required Laryngoscope Size: Glidescope and 4 Grade View: Grade II Tube type: Oral Tube size: 7.0 mm Number of attempts: 2 Airway Equipment and Method: Stylet, Oral airway and Video-laryngoscopy Placement Confirmation: ETT inserted through vocal cords under direct vision, positive ETCO2 and breath sounds checked- equal and bilateral Secured at: 21 cm Tube secured with: Tape Dental Injury: Teeth and Oropharynx as per pre-operative assessment  Difficulty Due To: Difficult Airway-  due to edematous airway, Difficulty was anticipated, Difficult Airway- due to large tongue, Difficult Airway- due to anterior larynx and Difficult Airway- due to reduced neck mobility Future Recommendations: Recommend- induction with short-acting agent, and alternative techniques readily available Comments: Two hand mask with oral airway. DL with glidescope 4 by SRNA, anterior/deviated glottic opening, unable to pass ett. Spo2 70s, pt masked, DL with glidescope 4 by MDA, airway edematous and deviated, 7.0 ett passed.
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
## 2021-02-05 NOTE — Progress Notes (Signed)
Dr. Everardo Pacific notified at 1153 that patient received Plavix at 1116 this morning

## 2021-02-05 NOTE — Interval H&P Note (Signed)
History and Physical Interval Note:  02/05/2021 11:32 AM  Kerry Young  has presented today for surgery, with the diagnosis of right tibia fracture.  The various methods of treatment have been discussed with the patient and family. After consideration of risks, benefits and other options for treatment, the patient has consented to  Procedure(s): INTRAMEDULLARY (IM) NAIL TIBIAL (Right) as a surgical intervention.  The patient's history has been reviewed, patient examined, no change in status, stable for surgery.  I have reviewed the patient's chart and labs.  Questions were answered to the patient's satisfaction.     Bjorn Pippin
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
## 2021-02-06 ENCOUNTER — Inpatient Hospital Stay (HOSPITAL_COMMUNITY): Payer: Medicaid Other

## 2021-02-06 ENCOUNTER — Encounter (HOSPITAL_COMMUNITY): Payer: Self-pay | Admitting: Orthopaedic Surgery

## 2021-02-06 DIAGNOSIS — I7771 Dissection of carotid artery: Secondary | ICD-10-CM

## 2021-02-06 MED ORDER — OXYCODONE HCL 5 MG PO TABS
10.0000 mg | ORAL_TABLET | ORAL | Status: DC | PRN
  Administered 2021-02-06 – 2021-02-11 (×5): 10 mg via ORAL
  Filled 2021-02-06 (×4): qty 2

## 2021-02-06 MED ORDER — CHLORHEXIDINE GLUCONATE 4 % EX LIQD
60.0000 mL | Freq: Once | CUTANEOUS | Status: AC
  Administered 2021-02-06: 4 via TOPICAL
  Filled 2021-02-06: qty 60

## 2021-02-06 MED ORDER — LIDOCAINE 5 % EX PTCH
2.0000 | MEDICATED_PATCH | CUTANEOUS | Status: DC
  Administered 2021-02-08 – 2021-02-10 (×3): 2 via TRANSDERMAL
  Administered 2021-02-11: 1 via TRANSDERMAL
  Administered 2021-02-12 – 2021-02-22 (×6): 2 via TRANSDERMAL
  Filled 2021-02-06 (×15): qty 2

## 2021-02-06 MED ORDER — PREGABALIN 100 MG PO CAPS
300.0000 mg | ORAL_CAPSULE | Freq: Two times a day (BID) | ORAL | Status: DC
  Administered 2021-02-06 – 2021-02-22 (×33): 300 mg via ORAL
  Filled 2021-02-06 (×6): qty 3
  Filled 2021-02-06: qty 6
  Filled 2021-02-06 (×2): qty 3
  Filled 2021-02-06: qty 6
  Filled 2021-02-06 (×6): qty 3
  Filled 2021-02-06: qty 6
  Filled 2021-02-06 (×16): qty 3

## 2021-02-06 MED ORDER — OXYCODONE HCL 5 MG PO TABS
15.0000 mg | ORAL_TABLET | ORAL | Status: DC | PRN
  Administered 2021-02-06 – 2021-02-10 (×13): 15 mg via ORAL
  Filled 2021-02-06 (×14): qty 3

## 2021-02-06 MED ORDER — METHOCARBAMOL 500 MG PO TABS
1000.0000 mg | ORAL_TABLET | Freq: Four times a day (QID) | ORAL | Status: DC
  Administered 2021-02-06 – 2021-02-22 (×63): 1000 mg via ORAL
  Filled 2021-02-06 (×65): qty 2
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
## 2021-02-06 NOTE — Progress Notes (Signed)
Carotid duplex has been completed.   Preliminary results in CV Proc.   Blanch Media 02/06/2021 10:22 AM

## 2021-02-06 NOTE — Progress Notes (Signed)
   ORTHOPAEDIC PROGRESS NOTE  s/p Procedure(s): INTRAMEDULLARY (IM) NAIL TIBIAL on 7/20 with Dr. Everardo Pacific  SUBJECTIVE: Reports pain in the right leg and right shoulder. Requesting Lyrica as this is what her pain management provider prescribes to her. States she gets sick without it. No chest pain. No SOB. No vomiting. No other complaints.  OBJECTIVE: PE: General: resting in hospital bed Right upper arm: swelling, ecchymosis of the right upper arm, tender to palpation throughout, ROM not tested in setting of known fracture, she endorses axillary nerve sensation which she states is symmetric to contralateral side, endorses distal sensation, unable to test distal motor function as patient states it hurts too much, warm well perfused hand Right lower extremity: dressing CDI, tender to palpation knee to ankle, compartments soft and compressible, no pain with passive stretch, intact EHL/TA/GSC, warm well perfused foot   Vitals:   02/05/21 2305 02/06/21 0335  BP: 103/69 (!) 147/86  Pulse: 97 79  Resp:    Temp: 97.8 F (36.6 C) 97.7 F (36.5 C)  SpO2: 95% 97%   CT scan ordered  ASSESSMENT: Kerry Young is a 55 y.o. female - s/p IMN right tibia - POD#1 - right proximal humerus fracture   PLAN: Weightbearing:   - RUE: NWB  - RLE: NWB Insicional and dressing care: Reinforce dressings as needed Orthopedic device(s): Sling Showering: Hold for now VTE prophylaxis: Lovenox.on plavix for cerebrovascular injury Pain control: per trauma Follow - up plan: 2 weeks in office Dispo: TBD. PT/OT evals pending. CT scan ordered Contact information:  Dr. Ramond Marrow, [name] PA-C, After hours and holidays please check [email or web address] for group call information for [facility]  [name], PA-C 02/06/2021
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
## 2021-02-06 NOTE — Progress Notes (Signed)
Pt states is not obtaining adequate pain control with current regimen.  Has not complained of her hands hurting her throughout the day, however, when the PA was in to discuss upcoming surgery on her right hand tomorrow, she became very animated, expressing her desire to be fed as she could no longer use her hands, which she had previously been using.  Appears to have very low tolerance for pain, but also an unclear understanding of pain management therapy.  She states she feels she should be given medications which will "knock her out" so she can sleep through the pain.  It was explained to her that one of the primary rationales for proper pain management was to enable mobility, thus improving independence over time.  This concept is difficult for her to comprehend.
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
## 2021-02-07 ENCOUNTER — Inpatient Hospital Stay (HOSPITAL_COMMUNITY): Payer: Medicaid Other

## 2021-02-07 ENCOUNTER — Encounter (HOSPITAL_COMMUNITY): Payer: Self-pay | Admitting: General Surgery

## 2021-02-07 ENCOUNTER — Inpatient Hospital Stay (HOSPITAL_COMMUNITY): Payer: Medicaid Other | Admitting: Anesthesiology

## 2021-02-07 ENCOUNTER — Encounter (HOSPITAL_COMMUNITY): Admission: EM | Disposition: A | Discharge status: Skilled Nursing Facility | Payer: Self-pay | Source: Home / Self Care

## 2021-02-07 DIAGNOSIS — I7771 Dissection of carotid artery: Secondary | ICD-10-CM | POA: Diagnosis not present

## 2021-02-07 HISTORY — PX: ORIF HUMERUS FRACTURE: SHX2126

## 2021-02-07 LAB — COMPREHENSIVE METABOLIC PANEL
ALT: 30 U/L (ref 0–44)
AST: 35 U/L (ref 15–41)
Albumin: 3.1 g/dL — ABNORMAL LOW (ref 3.5–5.0)
Alkaline Phosphatase: 51 U/L (ref 38–126)
Anion gap: 7 (ref 5–15)
BUN: 10 mg/dL (ref 6–20)
CO2: 22 mmol/L (ref 22–32)
Calcium: 8.7 mg/dL — ABNORMAL LOW (ref 8.9–10.3)
Chloride: 109 mmol/L (ref 98–111)
Creatinine, Ser: 0.63 mg/dL (ref 0.44–1.00)
GFR, Estimated: >60 mL/min (ref >60)
Glucose, Bld: 109 mg/dL — ABNORMAL HIGH (ref 70–99)
Potassium: 3.8 mmol/L (ref 3.5–5.1)
Sodium: 138 mmol/L (ref 135–145)
Total Bilirubin: 1.4 mg/dL — ABNORMAL HIGH (ref 0.3–1.2)
Total Protein: 5.9 g/dL — ABNORMAL LOW (ref 6.5–8.1)

## 2021-02-07 LAB — CBC
HCT: 28 % — ABNORMAL LOW (ref 36.0–46.0)
Hemoglobin: 9.2 g/dL — ABNORMAL LOW (ref 12.0–15.0)
MCH: 30.5 pg (ref 26.0–34.0)
MCHC: 32.9 g/dL (ref 30.0–36.0)
MCV: 92.7 fL (ref 80.0–100.0)
Platelets: 253 10*3/uL (ref 150–400)
RBC: 3.02 MIL/uL — ABNORMAL LOW (ref 3.87–5.11)
RDW: 13.8 % (ref 11.5–15.5)
WBC: 11.1 10*3/uL — ABNORMAL HIGH (ref 4.0–10.5)
nRBC: 0 % (ref 0.0–0.2)

## 2021-02-07 SURGERY — OPEN REDUCTION INTERNAL FIXATION (ORIF) PROXIMAL HUMERUS FRACTURE
Anesthesia: General | Laterality: Right

## 2021-02-07 MED ORDER — ACETAMINOPHEN 10 MG/ML IV SOLN
INTRAVENOUS | Status: DC | PRN
  Administered 2021-02-07: 1000 mg via INTRAVENOUS

## 2021-02-07 MED ORDER — CEFAZOLIN SODIUM-DEXTROSE 2-4 GM/100ML-% IV SOLN
2.0000 g | INTRAVENOUS | Status: AC
  Administered 2021-02-07: 2 g via INTRAVENOUS
  Filled 2021-02-07: qty 100

## 2021-02-07 MED ORDER — DEXAMETHASONE SODIUM PHOSPHATE 10 MG/ML IJ SOLN
INTRAMUSCULAR | Status: DC | PRN
  Administered 2021-02-07: 10 mg via INTRAVENOUS

## 2021-02-07 MED ORDER — SODIUM CHLORIDE 0.9 % IR SOLN
Status: DC | PRN
  Administered 2021-02-07 (×4): 3000 mL

## 2021-02-07 MED ORDER — PROPOFOL 10 MG/ML IV BOLUS
INTRAVENOUS | Status: DC | PRN
  Administered 2021-02-07: 150 mg via INTRAVENOUS

## 2021-02-07 MED ORDER — MIDAZOLAM HCL 5 MG/5ML IJ SOLN
INTRAMUSCULAR | Status: DC | PRN
  Administered 2021-02-07: 2 mg via INTRAVENOUS

## 2021-02-07 MED ORDER — EPINEPHRINE PF 1 MG/ML IJ SOLN
INTRAMUSCULAR | Status: AC
  Filled 2021-02-07: qty 2

## 2021-02-07 MED ORDER — CEFAZOLIN SODIUM-DEXTROSE 1-4 GM/50ML-% IV SOLN
1.0000 g | Freq: Three times a day (TID) | INTRAVENOUS | Status: AC
Start: 2021-02-07 — End: 2021-02-08
  Administered 2021-02-07 – 2021-02-08 (×2): 1 g via INTRAVENOUS
  Filled 2021-02-07 (×2): qty 50

## 2021-02-07 MED ORDER — DIPHENHYDRAMINE HCL 50 MG/ML IJ SOLN
INTRAMUSCULAR | Status: AC
  Filled 2021-02-07: qty 1

## 2021-02-07 MED ORDER — LIDOCAINE 2% (20 MG/ML) 5 ML SYRINGE
INTRAMUSCULAR | Status: DC | PRN
  Administered 2021-02-07: 60 mg via INTRAVENOUS

## 2021-02-07 MED ORDER — PROPOFOL 10 MG/ML IV BOLUS
INTRAVENOUS | Status: AC
  Filled 2021-02-07: qty 20

## 2021-02-07 MED ORDER — DEXAMETHASONE SODIUM PHOSPHATE 10 MG/ML IJ SOLN
INTRAMUSCULAR | Status: AC
  Filled 2021-02-07: qty 1

## 2021-02-07 MED ORDER — FENTANYL CITRATE (PF) 100 MCG/2ML IJ SOLN
25.0000 ug | INTRAMUSCULAR | Status: DC | PRN
  Administered 2021-02-07 (×2): 50 ug via INTRAVENOUS

## 2021-02-07 MED ORDER — CHLORHEXIDINE GLUCONATE 0.12 % MT SOLN: 15.0000 mL | Freq: Once | OROMUCOSAL | Status: AC

## 2021-02-07 MED ORDER — AMISULPRIDE (ANTIEMETIC) 5 MG/2ML IV SOLN
INTRAVENOUS | Status: AC
  Filled 2021-02-07: qty 4

## 2021-02-07 MED ORDER — ACETAMINOPHEN 10 MG/ML IV SOLN
INTRAVENOUS | Status: AC
  Filled 2021-02-07: qty 100

## 2021-02-07 MED ORDER — ORAL CARE MOUTH RINSE: 15.0000 mL | Freq: Once | OROMUCOSAL | Status: AC

## 2021-02-07 MED ORDER — BUPIVACAINE-EPINEPHRINE (PF) 0.25% -1:200000 IJ SOLN
INTRAMUSCULAR | Status: AC
  Filled 2021-02-07: qty 30

## 2021-02-07 MED ORDER — SUGAMMADEX SODIUM 200 MG/2ML IV SOLN
INTRAVENOUS | Status: DC | PRN
  Administered 2021-02-07: 50 mg via INTRAVENOUS
  Administered 2021-02-07: 200 mg via INTRAVENOUS
  Administered 2021-02-07: 50 mg via INTRAVENOUS

## 2021-02-07 MED ORDER — SUFENTANIL CITRATE 50 MCG/ML IV SOLN
INTRAVENOUS | Status: DC | PRN
  Administered 2021-02-07 (×3): 10 ug via INTRAVENOUS
  Administered 2021-02-07: 20 ug via INTRAVENOUS

## 2021-02-07 MED ORDER — FENTANYL CITRATE (PF) 100 MCG/2ML IJ SOLN
INTRAMUSCULAR | Status: AC
  Filled 2021-02-07: qty 2

## 2021-02-07 MED ORDER — SUCCINYLCHOLINE CHLORIDE 200 MG/10ML IV SOSY
PREFILLED_SYRINGE | INTRAVENOUS | Status: DC | PRN
  Administered 2021-02-07: 140 mg via INTRAVENOUS

## 2021-02-07 MED ORDER — BUPIVACAINE HCL (PF) 0.25 % IJ SOLN
INTRAMUSCULAR | Status: AC
  Filled 2021-02-07: qty 30

## 2021-02-07 MED ORDER — MIDAZOLAM HCL 2 MG/2ML IJ SOLN
INTRAMUSCULAR | Status: AC
  Filled 2021-02-07: qty 2

## 2021-02-07 MED ORDER — POVIDONE-IODINE 10 % EX SWAB
2.0000 "application " | Freq: Once | CUTANEOUS | Status: AC
  Administered 2021-02-07: 2 via TOPICAL

## 2021-02-07 MED ORDER — SODIUM CHLORIDE 0.9 % IR SOLN
Status: DC | PRN
  Administered 2021-02-07: 9000 mL

## 2021-02-07 MED ORDER — AMISULPRIDE (ANTIEMETIC) 5 MG/2ML IV SOLN
10.0000 mg | Freq: Once | INTRAVENOUS | Status: AC
  Administered 2021-02-07: 10 mg via INTRAVENOUS

## 2021-02-07 MED ORDER — PHENYLEPHRINE HCL (PRESSORS) 10 MG/ML IV SOLN
INTRAVENOUS | Status: DC | PRN
  Administered 2021-02-07: 120 ug via INTRAVENOUS

## 2021-02-07 MED ORDER — ROCURONIUM BROMIDE 10 MG/ML (PF) SYRINGE
PREFILLED_SYRINGE | INTRAVENOUS | Status: DC | PRN
  Administered 2021-02-07: 30 mg via INTRAVENOUS
  Administered 2021-02-07: 20 mg via INTRAVENOUS

## 2021-02-07 MED ORDER — LACTATED RINGERS IV SOLN: INTRAVENOUS | Status: DC

## 2021-02-07 MED ORDER — DIPHENHYDRAMINE HCL 50 MG/ML IJ SOLN
INTRAMUSCULAR | Status: DC | PRN
  Administered 2021-02-07: 12.5 mg via INTRAVENOUS

## 2021-02-07 MED ORDER — CHLORHEXIDINE GLUCONATE 0.12 % MT SOLN
OROMUCOSAL | Status: AC
  Administered 2021-02-07: 15 mL via OROMUCOSAL
  Filled 2021-02-07: qty 15

## 2021-02-07 MED ORDER — SUFENTANIL CITRATE 50 MCG/ML IV SOLN
INTRAVENOUS | Status: AC
  Filled 2021-02-07: qty 1

## 2021-02-07 MED ORDER — ONDANSETRON HCL 4 MG/2ML IJ SOLN
INTRAMUSCULAR | Status: AC
  Filled 2021-02-07: qty 2

## 2021-02-07 MED ORDER — BUPIVACAINE HCL 0.25 % IJ SOLN
INTRAMUSCULAR | Status: DC | PRN
  Administered 2021-02-07: 20 mL

## 2021-02-07 MED ORDER — LACTATED RINGERS IV SOLN: INTRAVENOUS | Status: DC | PRN

## 2021-02-07 SURGICAL SUPPLY — 71 items
AID PSTN UNV HD RSTRNT DISP (MISCELLANEOUS) ×1
ANCH SUT SWLK 19.1X6.25 CLS (Anchor) ×2 IMPLANT
ANCHOR SUT SWIVELLOK BIO (Anchor) ×2 IMPLANT
APL PRP STRL LF DISP 70% ISPRP (MISCELLANEOUS) ×3
BAG COUNTER SPONGE SURGICOUNT (BAG) ×2 IMPLANT
BAG SPNG CNTER NS LX DISP (BAG) ×1
BLADE EXCALIBUR 4.0X13 (MISCELLANEOUS) ×1 IMPLANT
CANNULA TWIST IN 8.25X7CM (CANNULA) ×1 IMPLANT
CHLORAPREP W/TINT 26 (MISCELLANEOUS) ×4 IMPLANT
CLSR STERI-STRIP ANTIMIC 1/2X4 (GAUZE/BANDAGES/DRESSINGS) ×1 IMPLANT
COVER MAYO STAND STRL (DRAPES) ×1 IMPLANT
COVER SURGICAL LIGHT HANDLE (MISCELLANEOUS) ×3 IMPLANT
DRAPE C-ARM 42X72 X-RAY (DRAPES) ×1 IMPLANT
DRAPE HALF SHEET 40X57 (DRAPES) ×2 IMPLANT
DRAPE IMP U-DRAPE 54X76 (DRAPES) ×3 IMPLANT
DRAPE INCISE IOBAN 66X45 STRL (DRAPES) ×1 IMPLANT
DRAPE ORTHO SPLIT 77X108 STRL (DRAPES) ×4
DRAPE SURG 17X23 STRL (DRAPES) ×1 IMPLANT
DRAPE SURG ORHT 6 SPLT 77X108 (DRAPES) ×2 IMPLANT
DRAPE U-SHAPE 47X51 STRL (DRAPES) ×1 IMPLANT
DRSG MEPILEX BORDER 4X8 (GAUZE/BANDAGES/DRESSINGS) IMPLANT
ELECT BLADE 4.0 EZ CLEAN MEGAD (MISCELLANEOUS)
ELECT REM PT RETURN 9FT ADLT (ELECTROSURGICAL) ×2
ELECTRODE BLDE 4.0 EZ CLN MEGD (MISCELLANEOUS) IMPLANT
ELECTRODE REM PT RTRN 9FT ADLT (ELECTROSURGICAL) ×1 IMPLANT
GAUZE 4X4 16PLY ~~LOC~~+RFID DBL (SPONGE) ×1 IMPLANT
GAUZE SPONGE 4X4 12PLY STRL (GAUZE/BANDAGES/DRESSINGS) ×1 IMPLANT
GLOVE SRG 8 PF TXTR STRL LF DI (GLOVE) ×1 IMPLANT
GLOVE SURG ENC MOIS LTX SZ6.5 (GLOVE) ×2 IMPLANT
GLOVE SURG LTX SZ8 (GLOVE) ×2 IMPLANT
GLOVE SURG UNDER LTX SZ6.5 (GLOVE) ×2 IMPLANT
GLOVE SURG UNDER POLY LF SZ8 (GLOVE) ×2
GOWN STRL REUS W/ TWL LRG LVL3 (GOWN DISPOSABLE) ×3 IMPLANT
GOWN STRL REUS W/ TWL XL LVL3 (GOWN DISPOSABLE) ×1 IMPLANT
GOWN STRL REUS W/TWL LRG LVL3 (GOWN DISPOSABLE) ×6
GOWN STRL REUS W/TWL XL LVL3 (GOWN DISPOSABLE) ×2
IMPL SPEEDBRIDGE KIT (Orthopedic Implant) IMPLANT
IMPLANT SPEEDBRIDGE KIT (Orthopedic Implant) ×2 IMPLANT
KIT BASIN OR (CUSTOM PROCEDURE TRAY) ×2 IMPLANT
KIT TURNOVER KIT B (KITS) ×2 IMPLANT
MANIFOLD NEPTUNE II (INSTRUMENTS) ×3 IMPLANT
NDL SPNL 18GX3.5 QUINCKE PK (NEEDLE) IMPLANT
NDL SUT .5 MAYO 1.404X.05X (NEEDLE) ×1 IMPLANT
NDL SUT 2 .5 CRC MAYO 1.732X (NEEDLE) ×1 IMPLANT
NEEDLE 22X1 1/2 (OR ONLY) (NEEDLE) IMPLANT
NEEDLE MAYO TAPER (NEEDLE) ×6
NEEDLE SPNL 18GX3.5 QUINCKE PK (NEEDLE) ×2 IMPLANT
NS IRRIG 1000ML POUR BTL (IV SOLUTION) ×1 IMPLANT
PACK SHOULDER (CUSTOM PROCEDURE TRAY) ×2 IMPLANT
PAD ABD 8X10 STRL (GAUZE/BANDAGES/DRESSINGS) ×1 IMPLANT
PAD ARMBOARD 7.5X6 YLW CONV (MISCELLANEOUS) ×4 IMPLANT
PROBE APOLLO 90XL (SURGICAL WAND) ×1 IMPLANT
RESTRAINT HEAD UNIVERSAL NS (MISCELLANEOUS) ×2 IMPLANT
SLING ARM FOAM STRAP LRG (SOFTGOODS) ×1 IMPLANT
SPONGE T-LAP 18X18 ~~LOC~~+RFID (SPONGE) ×3 IMPLANT
STAPLER VISISTAT 35W (STAPLE) ×1 IMPLANT
STRIP CLOSURE SKIN 1/2X4 (GAUZE/BANDAGES/DRESSINGS) ×2 IMPLANT
SUCTION FRAZIER HANDLE 10FR (MISCELLANEOUS) ×2
SUCTION TUBE FRAZIER 10FR DISP (MISCELLANEOUS) ×1 IMPLANT
SUT MNCRL AB 4-0 PS2 18 (SUTURE) ×1 IMPLANT
SUT VIC AB 2-0 CT1 27 (SUTURE) ×4
SUT VIC AB 2-0 CT1 TAPERPNT 27 (SUTURE) ×2 IMPLANT
SUTURE TAPE TIGERLINK 1.3MM BL (SUTURE) IMPLANT
SUTURETAPE TIGERLINK 1.3MM BL (SUTURE) ×4
SYR CONTROL 10ML LL (SYRINGE) IMPLANT
TAPE CLOTH SURG 6X10 WHT LF (GAUZE/BANDAGES/DRESSINGS) ×1 IMPLANT
TOWEL GREEN STERILE (TOWEL DISPOSABLE) ×2 IMPLANT
TOWEL GREEN STERILE FF (TOWEL DISPOSABLE) ×2 IMPLANT
TUBE CONNECTING 12X1/4 (SUCTIONS) ×2 IMPLANT
WATER STERILE IRR 1000ML POUR (IV SOLUTION) ×2 IMPLANT
YANKAUER SUCT BULB TIP NO VENT (SUCTIONS) ×2 IMPLANT

## 2021-02-07 NOTE — Progress Notes (Signed)
   02/07/21 0849  Clinical Encounter Type  Visited With Patient  Visit Type Initial;Spiritual support;Pre-op  Referral From Nurse  Consult/Referral To Chaplain  Spiritual Encounters  Spiritual Needs Prayer;Emotional   Chaplain responded to consult for prayer. Pt expressed anxiety over surgery today. Chaplain engaged active listening and provided emotional and spiritual support. Chaplain prayed with Pt for safety and strength. Chaplain remains available.  This note was prepared by Chaplain Resident, Tacy Learn, MDiv. Chaplain remains available as needed through the on-call pager: [phone].

## 2021-02-07 NOTE — Interval H&P Note (Signed)
Proximal humerus fracture is atypical and a cuff variant.  Plan for scope vs open vs reverse total shoulder.  High risk of failure but we feel its apporpriate to try to fix as function will be predictably poor without surgery.  If fails may need reverse.  Fracture fragments in joint and posteriorly displaced.  Ulna fracture to be treated non-op ideally with nightstick brace.  Sling postop.     History and Physical Interval Note:  02/07/2021 12:33 PM  Kerry Young  has presented today for surgery, with the diagnosis of right proximal humerus fracture.  The various methods of treatment have been discussed with the patient and family. After consideration of risks, benefits and other options for treatment, the patient has consented to  Procedure(s): SCOPE vs.OPEN REDUCTION INTERNAL FIXATION (ORIF) PROXIMAL HUMERUS FRACTURE (Right) as a surgical intervention.  The patient's history has been reviewed, patient examined, no change in status, stable for surgery.  I have reviewed the patient's chart and labs.  Questions were answered to the patient's satisfaction.     Bjorn Pippin

## 2021-02-07 NOTE — Anesthesia Procedure Notes (Addendum)
Procedure Name: Intubation Date/Time: 02/07/2021 1:07 PM Performed by: Garner Nash, CRNA Pre-anesthesia Checklist: Patient identified, Emergency Drugs available, Suction available, Patient being monitored and Timeout performed Patient Re-evaluated:Patient Re-evaluated prior to induction Oxygen Delivery Method: Circle system utilized Preoxygenation: Pre-oxygenation with 100% oxygen Induction Type: IV induction Ventilation: Mask ventilation without difficulty Laryngoscope Size: Mac and 4 Grade View: Grade II Tube type: Oral Tube size: 7.5 mm Number of attempts: 1 Airway Equipment and Method: Stylet Placement Confirmation: ETT inserted through vocal cords under direct vision, positive ETCO2 and breath sounds checked- equal and bilateral Secured at: 22 cm Tube secured with: Tape Dental Injury: Teeth and Oropharynx as per pre-operative assessment

## 2021-02-07 NOTE — Progress Notes (Signed)
OT Cancellation Note  Patient Details Name: Kerry Young MRN: [id number] DOB: March 24, 1966   Cancelled Treatment:    Reason Eval/Treat Not Completed: Patient at procedure or test/ unavailable (ORIF R humerus)  Mateo Flow 02/07/2021, 12:59 PM  Timmothy Euler, OTR/L  Acute Rehabilitation Services Pager: [phone] Office: [phone] .

## 2021-02-07 NOTE — Progress Notes (Signed)
   ORTHOPAEDIC PROGRESS NOTE  s/p Procedure(s): INTRAMEDULLARY (IM) NAIL TIBIAL on 7/20 with Dr. Everardo Pacific Right proximal humerus fracture  SUBJECTIVE: Patient having pain all over, but appears comfortable in bed. No chest pain. No SOB. No vomiting. No other complaints.  OBJECTIVE: PE: General: resting in hospital bed Right upper arm: swelling, ecchymosis of the right upper arm, tender to palpation throughout, ROM not tested in setting of known fracture, she endorses axillary nerve sensation which she states is symmetric to contralateral side, endorses distal sensation, unable to test distal motor function as patient states it hurts too much, warm well perfused hand Left upper arm: swelling left hand, reports tenderness from left forearm to left hand, DPC of 3, wiggles fingers slightly, warm well perfused hand Right lower extremity: dressing CDI, tender to palpation knee to ankle, compartments soft and compressible, no pain with passive stretch, intact EHL/TA/GSC, endorses distal sensation, warm well perfused foot   Vitals:   02/07/21 0343 02/07/21 0349  BP:  125/88  Pulse:  98  Resp:  20  Temp: 98.8 F (37.1 C)   SpO2:  96%   CT scan demonstrates greater tuberosity fracture which is significantly displaced into the posterior aspect of the glenohumeral joint space  Left hand x-rays demonstrate no acute bony injury in the left hand, however there was partial visualization of a ulnar diaphyseal fracture  Dedicated forearm x-rays have been ordered  ASSESSMENT: Kerry Young is a 55 y.o. female - s/p IMN right tibia - POD#2 - right proximal humerus fracture   PLAN: - Right proximal humerus fracture: Again discussed the need for surgical intervention with the patient.   Discussed the nature of the injury as well as the care with the patient.  Due to the significant displacement of the fragments, it is unlikely the fracture would heal with non-operative measures.  Based on this our  recommendation is for operative measures.   Plan would be for open reduction internal fixation of right proximal humerus fracture this afternoon.   Keep NPO  We did discuss with the patient that if we were unable to get appropriate alignment to repair the fracture, a reverse total shoulder arthroplasty would be the next option. Patient stated her understanding of this and agreed to proceed.    The risks benefits and alternatives were discussed with the patient including but not limited to the risks of nonoperative treatment, versus surgical intervention including infection, bleeding, nerve injury, periprosthetic fracture, the need for revision surgery, blood clots, cardiopulmonary complications, morbidity, mortality, among others, and they were willing to proceed.     We additionally specifically discussed risks of axillary nerve injury, infection, periprosthetic fracture, continued pain and longevity of implants prior to beginning procedure.   - Right lower extremity:  Weightbearing:   - RLE: NWB Insicional and dressing care: Reinforce dressings as needed  - Left upper extremity: - Dedicated forearm x-rays have been ordered  - Hand x-rays showed partial visualization of left ulnar diaphyseal fracture - appears to be non-displaced   Showering: Hold for now VTE prophylaxis: Lovenox.on plavix for cerebrovascular injury Pain control: per trauma Follow - up plan: 2 weeks in office Dispo: TBD.  Contact information:  Dr. Ramond Marrow, [name] PA-C, After hours and holidays please check [email or web address] for group call information for [facility]  [name], PA-C 02/07/2021

## 2021-02-07 NOTE — Progress Notes (Signed)
PT Cancellation Note  Patient Details Name: Kerry Young MRN: [id number] DOB: 1965/12/04   Cancelled Treatment:    Reason Eval/Treat Not Completed: Patient at procedure or test/unavailable. Pt at procedure for ORIF R humerus. Will follow-up as able.   Raymond Gurney, PT, DPT Acute Rehabilitation Services  Pager: [phone] Office: [phone]    Jewel Baize 02/07/2021, 1:58 PM
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
## 2021-02-07 NOTE — Anesthesia Postprocedure Evaluation (Signed)
Anesthesia Post Note  Patient: Kerry Young  Procedure(s) Performed: Arthroscopic repair rotator cuff and fixation of PROXIMAL HUMERUS FRACTURE (Right)     Patient location during evaluation: PACU Anesthesia Type: General Level of consciousness: awake Pain management: pain level controlled Vital Signs Assessment: post-procedure vital signs reviewed and stable Respiratory status: spontaneous breathing Cardiovascular status: stable Postop Assessment: no apparent nausea or vomiting Anesthetic complications: no   No notable events documented.  Last Vitals:  Vitals:   02/07/21 1600 02/07/21 1637  BP: (!) 163/82 (!) 156/84  Pulse: 90 98  Resp: 14 17  Temp: 36.7 C 36.8 C  SpO2: 97% 90%    Last Pain:  Vitals:   02/07/21 1637  TempSrc: Oral  PainSc:                  Cutberto Winfree

## 2021-02-07 NOTE — H&P (View-Only) (Signed)
   ORTHOPAEDIC PROGRESS NOTE  s/p Procedure(s): INTRAMEDULLARY (IM) NAIL TIBIAL on 7/20 with Dr. Varkey Right proximal humerus fracture  SUBJECTIVE: Patient having pain all over, but appears comfortable in bed. No chest pain. No SOB. No vomiting. No other complaints.  OBJECTIVE: PE: General: resting in hospital bed Right upper arm: swelling, ecchymosis of the right upper arm, tender to palpation throughout, ROM not tested in setting of known fracture, she endorses axillary nerve sensation which she states is symmetric to contralateral side, endorses distal sensation, unable to test distal motor function as patient states it hurts too much, warm well perfused hand Left upper arm: swelling left hand, reports tenderness from left forearm to left hand, DPC of 3, wiggles fingers slightly, warm well perfused hand Right lower extremity: dressing CDI, tender to palpation knee to ankle, compartments soft and compressible, no pain with passive stretch, intact EHL/TA/GSC, endorses distal sensation, warm well perfused foot   Vitals:   02/07/21 0343 02/07/21 0349  BP:  125/88  Pulse:  98  Resp:  20  Temp: 98.8 F (37.1 C)   SpO2:  96%   CT scan demonstrates greater tuberosity fracture which is significantly displaced into the posterior aspect of the glenohumeral joint space  Left hand x-rays demonstrate no acute bony injury in the left hand, however there was partial visualization of a ulnar diaphyseal fracture  Dedicated forearm x-rays have been ordered  ASSESSMENT: Kerry Young is a 55 y.o. female - s/p IMN right tibia - POD#2 - right proximal humerus fracture   PLAN: - Right proximal humerus fracture: Again discussed the need for surgical intervention with the patient.   Discussed the nature of the injury as well as the care with the patient.  Due to the significant displacement of the fragments, it is unlikely the fracture would heal with non-operative measures.  Based on this our  recommendation is for operative measures.   Plan would be for open reduction internal fixation of right proximal humerus fracture this afternoon.   Keep NPO  We did discuss with the patient that if we were unable to get appropriate alignment to repair the fracture, a reverse total shoulder arthroplasty would be the next option. Patient stated her understanding of this and agreed to proceed.    The risks benefits and alternatives were discussed with the patient including but not limited to the risks of nonoperative treatment, versus surgical intervention including infection, bleeding, nerve injury, periprosthetic fracture, the need for revision surgery, blood clots, cardiopulmonary complications, morbidity, mortality, among others, and they were willing to proceed.     We additionally specifically discussed risks of axillary nerve injury, infection, periprosthetic fracture, continued pain and longevity of implants prior to beginning procedure.   - Right lower extremity:  Weightbearing:   - RLE: NWB Insicional and dressing care: Reinforce dressings as needed  - Left upper extremity: - Dedicated forearm x-rays have been ordered  - Hand x-rays showed partial visualization of left ulnar diaphyseal fracture - appears to be non-displaced   Showering: Hold for now VTE prophylaxis: Lovenox.on plavix for cerebrovascular injury Pain control: per trauma Follow - up plan: 2 weeks in office Dispo: TBD.  Contact information:  Dr. Dax Varkey, Cherae Marton PA-C, After hours and holidays please check [email or web address] for group call information for [facility]  Erek Kowal, PA-C 02/07/2021   

## 2021-02-07 NOTE — Progress Notes (Signed)
Patient has refused her pre-op drink.
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
## 2021-02-07 NOTE — Progress Notes (Signed)
Spoke with patient regarding her right shoulder proximal humerus fracture. Discussed the nature of the injury as well as the care with the patient.  Due to the significant displacement of the fragments, it is unlikely the fracture would heal with non-operative measures.  Based on this our recommendation is for operative measures.  Plan would be for open reduction internal fixation of right proximal humerus fracture. We did discuss with the patient that if we were unable to get appropriate alignment to repair the fracture, a reverse total shoulder arthroplasty would be the next option. Patient stated her understanding of this and agreed to proceed.   The risks benefits and alternatives were discussed with the patient including but not limited to the risks of nonoperative treatment, versus surgical intervention including infection, bleeding, nerve injury, periprosthetic fracture, the need for revision surgery, blood clots, cardiopulmonary complications, morbidity, mortality, among others, and they were willing to proceed.    We additionally specifically discussed risks of axillary nerve injury, infection, periprosthetic fracture, continued pain and longevity of implants prior to beginning procedure.   Plan: operative fixation of right proximal humerus fracture on 7/22 Keep NPO at midnight  Patient was also complaining of left hand pain. Will order x-rays to r/o fracture.   [name], PA-C 02/06/2021

## 2021-02-07 NOTE — Transfer of Care (Signed)
Immediate Anesthesia Transfer of Care Note  Patient: Kerry Young  Procedure(s) Performed: Arthroscopic repair rotator cuff and fixation of PROXIMAL HUMERUS FRACTURE (Right)  Patient Location: PACU  Anesthesia Type:General  Level of Consciousness: awake and patient cooperative  Airway & Oxygen Therapy: Patient Spontanous Breathing and Patient connected to face mask oxygen  Post-op Assessment: Report given to RN and Post -op Vital signs reviewed and stable  Post vital signs: Reviewed and stable  Last Vitals:  Vitals Value Taken Time  BP    Temp    Pulse    Resp    SpO2      Last Pain:  Vitals:   02/07/21 1211  TempSrc:   PainSc: 5       Patients Stated Pain Goal: 2 (02/07/21 0800)  Complications: No notable events documented.

## 2021-02-07 NOTE — Progress Notes (Signed)
   VASCULAR SURGERY ASSESSMENT & PLAN:   INTIMAL DEFECT LEFT COMMON CAROTID ARTERY: Patient is doing well neurologically.  A follow-up duplex scan yesterday showed no evidence of dissection in the left common carotid artery.  What ever subtle injury was noted on CT scan has resolved.  No further work-up from a vascular standpoint is necessary at this point.  I have arranged a follow-up carotid duplex scan in 6 months in my office.  Vascular surgery will be available as needed.   SUBJECTIVE:   She wants to speak to her boyfriend.  PHYSICAL EXAM:   Vitals:   02/06/21 2215 02/07/21 0000 02/07/21 0343 02/07/21 0349  BP: 123/90 106/77  125/88  Pulse: (!) 111 (!) 104  98  Resp: 17 20  20   Temp: 97.6 F (36.4 C) 98.7 F (37.1 C) 98.8 F (37.1 C)   TempSrc: Oral Oral Oral   SpO2: 98% 97%  96%  Weight:      Height:       Moving all extremities.  LABS:   CAROTID DUPLEX: I have reviewed the carotid duplex scan from yesterday.  There is no evidence of carotid dissection in the left common carotid artery.  There is no significant carotid stenosis bilaterally.  Lab Results  Component Value Date   WBC 11.1 (H) 02/07/2021   HGB 9.2 (L) 02/07/2021   HCT 28.0 (L) 02/07/2021   MCV 92.7 02/07/2021   PLT 253 02/07/2021   PROBLEM LIST:    Active Problems:   Proximal humerus fracture  CURRENT MEDS:    acetaminophen  1,000 mg Oral Q6H   aspirin EC  81 mg Oral Daily   clopidogrel  75 mg Oral Daily   enoxaparin (LOVENOX) injection  30 mg Subcutaneous Q12H   lidocaine  2 patch Transdermal Q24H   methocarbamol  1,000 mg Oral QID   pantoprazole  40 mg Oral Daily   Or   pantoprazole (PROTONIX) IV  40 mg Intravenous Daily   pregabalin  300 mg Oral BID    02/09/2021 Office: [phone] 02/07/2021
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
## 2021-02-08 NOTE — Progress Notes (Signed)
Orthopedic Tech Progress Note Patient Details:  Kerry Young 1966-02-17 [id number] Called order into [facility]  Patient ID: Kerry Young, female   DOB: 04/01/1966, 55 y.o.   MRN: [id number]  Kerry Young 02/08/2021, 1:42 PM
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
## 2021-02-08 NOTE — TOC Progression Note (Signed)
Transition of Care [facility]) - Progression Note    Patient Details  Name: Kerry Young MRN: [id number] Date of Birth: [birth year]  Transition of [facility]) CM/SW Contact  Annalee Genta, LCSW Phone Number: 02/08/2021, 10:59 AM  Clinical Narrative:    CSW introduced self and role to patient to inquire on recommendations from physical therapy for skilled nursing facility. Patient expressed apprehension regarding her medications and needing to stay on them and informed patient that would be a follow-up conversation with her doctor here. CSW discussed the SNF referral process and treatment from it and noted patient provided verbal consent for SNF referrals in the [place] area. CSW will begin the SNF referral process and follow-up with patient when bed offers are made.    Expected Discharge Plan: Skilled Nursing Facility Barriers to Discharge: Continued Medical Work up  Expected Discharge Plan and Services Expected Discharge Plan: Skilled Nursing Facility   Discharge Planning Services: CM Consult   Living arrangements for the past 2 months: Single Family Home                                       Social Determinants of Health (SDOH) Interventions    Readmission Risk Interventions No flowsheet data found.

## 2021-02-08 NOTE — NC FL2 (Signed)
[place] MEDICAID FL2 LEVEL OF CARE SCREENING TOOL     IDENTIFICATION  Patient Name: Kerry Young Birthdate: 04-18-1966 Sex: female Admission Date (Current Location): 02/04/2021  [facility] and [place] Number:  Producer, television/film/video and Address:  [facility], [address], [place], [place] [address]      Provider Number: [id number]  Attending Physician Name and Address:  Md, Trauma, MD  Relative Name and Phone Number:  Alferd Patee, significant other, [phone]    Current Level of Care: Hospital Recommended Level of Care: Skilled Nursing Facility Prior Approval Number:    Date Approved/Denied:   PASRR Number: -Unknown, no know SSN  Discharge Plan: Home    Current Diagnoses: Patient Active Problem List   Diagnosis Date Noted   Proximal humerus fracture 02/04/2021    Orientation RESPIRATION BLADDER Height & Weight     Self, Time, Place, Situation  Normal Continent Weight: 180 lb (81.6 kg) Height:  5\' 6"  (167.6 cm)  BEHAVIORAL SYMPTOMS/MOOD NEUROLOGICAL BOWEL NUTRITION STATUS      Continent Diet (carb modified)  AMBULATORY STATUS COMMUNICATION OF NEEDS Skin   Extensive Assist Verbally Normal                       Personal Care Assistance Level of Assistance  Bathing, Feeding, Dressing Bathing Assistance: Maximum assistance Feeding assistance: Limited assistance Dressing Assistance: Maximum assistance     Functional Limitations Info             SPECIAL CARE FACTORS FREQUENCY  OT (By licensed OT), PT (By licensed PT)     PT Frequency: 5x weekly OT Frequency: 5x weekly            Contractures Contractures Info: Not present    Additional Factors Info  Code Status, Allergies Code Status Info: Full Allergies Info: citalopram, naproxen, oxycodone, zofran           Current Medications (02/08/2021):  This is the current hospital active medication list Current Facility-Administered Medications  Medication Dose Route  Frequency Provider Last Rate Last Admin   0.9 % NaCl with KCl 20 mEq/ L  infusion   Intravenous Continuous 02/10/2021, PA-C 50 mL/hr at 02/07/21 1644 New Bag at 02/07/21 1644   acetaminophen (TYLENOL) tablet 1,000 mg  1,000 mg Oral Q6H McBane, Caroline N, PA-C   1,000 mg at 02/08/21 0505   ALPRAZolam (XANAX) tablet 1 mg  1 mg Oral BID PRN 02/10/21, PA-C   1 mg at 02/08/21 02/10/21   aspirin EC tablet 81 mg  81 mg Oral Daily 0737, PA-C   81 mg at 02/08/21 0844   clopidogrel (PLAVIX) tablet 75 mg  75 mg Oral Daily 02/10/21, PA-C   75 mg at 02/08/21 0843   enoxaparin (LOVENOX) injection 30 mg  30 mg Subcutaneous Q12H 02/10/21, PA-C   30 mg at 02/08/21 0840   HYDROmorphone (DILAUDID) injection 1 mg  1 mg Intravenous Q2H PRN 02/10/21, PA-C   1 mg at 02/08/21 0842   lidocaine (LIDODERM) 5 % 2 patch  2 patch Transdermal Q24H McBane, Caroline N, PA-C       methocarbamol (ROBAXIN) tablet 1,000 mg  1,000 mg Oral QID 02/10/21, PA-C   1,000 mg at 02/08/21 02/10/21   oxyCODONE (Oxy IR/ROXICODONE) immediate release tablet 10 mg  10 mg Oral Q4H PRN 1062, PA-C   10 mg at 02/07/21 02/09/21  oxyCODONE (Oxy IR/ROXICODONE) immediate release tablet 15 mg  15 mg Oral Q4H PRN Vernetta Honey, PA-C   15 mg at 02/08/21 0731   pantoprazole (PROTONIX) EC tablet 40 mg  40 mg Oral Daily Vernetta Honey, PA-C   40 mg at 02/06/21 3338   pregabalin (LYRICA) capsule 300 mg  300 mg Oral BID Vernetta Honey, PA-C   300 mg at 02/08/21 3291   prochlorperazine (COMPAZINE) tablet 10 mg  10 mg Oral Q6H PRN Vernetta Honey, PA-C       Or   prochlorperazine (COMPAZINE) injection 5-10 mg  5-10 mg Intravenous Q6H PRN Vernetta Honey, PA-C   10 mg at 02/06/21 9166     Discharge Medications: Please see discharge summary for a list of discharge medications.  Relevant Imaging Results:  Relevant Lab Results:   Additional Information    Prisha Hiley Geralynn Ochs, LCSW
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
## 2021-02-08 NOTE — Plan of Care (Signed)
  Problem: Education: Goal: Knowledge of General Education information will improve Description: Including pain rating scale, medication(s)/side effects and non-pharmacologic comfort measures Outcome: Progressing   Problem: Health Behavior/Discharge Planning: Goal: Ability to manage health-related needs will improve Outcome: Progressing   Problem: Clinical Measurements: Goal: Ability to maintain clinical measurements within normal limits will improve Outcome: Progressing Goal: Will remain free from infection Outcome: Progressing Goal: Diagnostic test results will improve Outcome: Progressing Goal: Respiratory complications will improve Outcome: Progressing Goal: Cardiovascular complication will be avoided Outcome: Progressing   Problem: Activity: Goal: Risk for activity intolerance will decrease Outcome: Progressing   Problem: Nutrition: Goal: Adequate nutrition will be maintained Outcome: Progressing   Problem: Coping: Goal: Level of anxiety will decrease Outcome: Progressing   Problem: Elimination: Goal: Will not experience complications related to bowel motility Outcome: Progressing Goal: Will not experience complications related to urinary retention Outcome: Progressing   Problem: Pain Managment: Goal: General experience of comfort will improve Outcome: Progressing   Problem: Safety: Goal: Ability to remain free from injury will improve Outcome: Progressing   Problem: Skin Integrity: Goal: Risk for impaired skin integrity will decrease Outcome: Progressing   Problem: Activity: Goal: Ability to avoid complications of mobility impairment will improve Outcome: Progressing Goal: Ability to tolerate increased activity will improve Outcome: Progressing   Problem: Education: Goal: Verbalization of understanding the information provided will improve Outcome: Progressing   Problem: Coping: Goal: Level of anxiety will decrease Outcome: Progressing   Problem:  Physical Regulation: Goal: Postoperative complications will be avoided or minimized Outcome: Progressing   Problem: Respiratory: Goal: Ability to maintain a clear airway will improve Outcome: Progressing   Problem: Pain Management: Goal: Pain level will decrease Outcome: Progressing   Problem: Skin Integrity: Goal: Signs of wound healing will improve Outcome: Progressing   Problem: Tissue Perfusion: Goal: Ability to maintain adequate tissue perfusion will improve Outcome: Progressing   

## 2021-02-09 NOTE — Plan of Care (Signed)
  Problem: Education: Goal: Knowledge of General Education information will improve Description: Including pain rating scale, medication(s)/side effects and non-pharmacologic comfort measures Outcome: Progressing   Problem: Health Behavior/Discharge Planning: Goal: Ability to manage health-related needs will improve Outcome: Progressing   Problem: Clinical Measurements: Goal: Ability to maintain clinical measurements within normal limits will improve Outcome: Progressing Goal: Will remain free from infection Outcome: Progressing Goal: Diagnostic test results will improve Outcome: Progressing Goal: Respiratory complications will improve Outcome: Progressing Goal: Cardiovascular complication will be avoided Outcome: Progressing   Problem: Activity: Goal: Risk for activity intolerance will decrease Outcome: Progressing   Problem: Nutrition: Goal: Adequate nutrition will be maintained Outcome: Progressing   Problem: Coping: Goal: Level of anxiety will decrease Outcome: Progressing   Problem: Elimination: Goal: Will not experience complications related to bowel motility Outcome: Progressing Goal: Will not experience complications related to urinary retention Outcome: Progressing   Problem: Pain Managment: Goal: General experience of comfort will improve Outcome: Progressing   Problem: Safety: Goal: Ability to remain free from injury will improve Outcome: Progressing   Problem: Skin Integrity: Goal: Risk for impaired skin integrity will decrease Outcome: Progressing   Problem: Activity: Goal: Ability to avoid complications of mobility impairment will improve Outcome: Progressing Goal: Ability to tolerate increased activity will improve Outcome: Progressing   Problem: Education: Goal: Verbalization of understanding the information provided will improve Outcome: Progressing   Problem: Coping: Goal: Level of anxiety will decrease Outcome: Progressing   Problem:  Physical Regulation: Goal: Postoperative complications will be avoided or minimized Outcome: Progressing   Problem: Respiratory: Goal: Ability to maintain a clear airway will improve Outcome: Progressing   Problem: Pain Management: Goal: Pain level will decrease Outcome: Progressing   Problem: Skin Integrity: Goal: Signs of wound healing will improve Outcome: Progressing   Problem: Tissue Perfusion: Goal: Ability to maintain adequate tissue perfusion will improve Outcome: Progressing   
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
## 2021-02-09 NOTE — Progress Notes (Signed)
     Subjective: Patient is alert, oriented. Patient reports pain as moderate.   Sling is uncomfortable on her neck  Objective:  PE: VITALS:   Vitals:   02/08/21 1751 02/08/21 1939 02/09/21 0741 02/09/21 1121  BP: 139/77 134/62 (!) 149/73 (!) 143/78  Pulse: 97 99 (!) 102 90  Resp: 18 19 20 20   Temp: 98.3 F (36.8 C) 98.7 F (37.1 C) 98.3 F (36.8 C) (!) 97.4 F (36.3 C)  TempSrc: Oral Oral Oral Oral  SpO2: 94% 94% 96% 94%  Weight:      Height:       General: sitting up in hospital bed RUE: dressings CDI, sling out of place but has been corrected she endorses axillary nerve sensation, endorses distal sensation, + Motor in  AIN, PIN, Ulnar distributions. Well perfused digits.   2+ radial pulse LUE: ecchymosis throughout forearm, no brace on, diffusely TTP to wrist. Able to flex, extend, and abduct all fingers, warm and well perfused hand, endorses distal sensation, capillary refill intact RLE - dressing CDI, tender to palpation knee to ankle, compartments soft and compressible, no pain with passive stretch, Able to flex and extend all toes, dorsiflexion and plantarflexion intact, endorses distal sensation, + DP pulse, warm well perfused digits   LABS  No results found for this or any previous visit (from the past 24 hour(s)).  DG Shoulder Right Port  Result Date: 02/07/2021 CLINICAL DATA:  Post right shoulder surgery. Rotator cuff repair. Fractured humeral head. EXAM: PORTABLE RIGHT SHOULDER COMPARISON:  Humerus radiograph 02/04/2021, shoulder CT 02/05/2021 FINDINGS: Proximal humerus fracture with displaced fragment involving the greater tuberosity. Glenohumeral alignment is maintained on provided views. Recent postsurgical change includes air and edema in the soft tissues. IMPRESSION: Postsurgical change of the right shoulder with displaced fracture fragment about the greater tuberosity. Electronically Signed   By: 02/07/2021 M.D.   On: 02/07/2021 16:07     Assessment/Plan: Right tibia fracture - 7/20 - Right tibia IM Nail by Dr. 8/20 - POD #4  - NWB RLE, reinforce dressings as needed  Right proximal humerus fracture - 7/22 - Right proximal humerus ORIF with arthroscopic rotator cuff repair - POD#2 - NWB RUE, sling at all times - discussed the importance of sling usage - reinforce dressings as needed  Left ulna fracture - continue non operative treatment,  - will place to have left arm IV moved to right arm so that sarmiento forearm splint can be placed to left arm - sarmiento forearm splint has not been sent to the room yet   Pain control and VTE prophylaxis - per trauma Continue PT/OT Follow up in 1-2 week in office    8/22 Lavarr President 02/09/2021, 11:32 AM

## 2021-02-10 MED ORDER — IBUPROFEN 600 MG PO TABS: 600.0000 mg | ORAL_TABLET | Freq: Four times a day (QID) | ORAL | Status: DC | PRN

## 2021-02-10 MED ORDER — HYDROMORPHONE HCL 1 MG/ML IJ SOLN
0.5000 mg | INTRAMUSCULAR | Status: DC | PRN
  Administered 2021-02-10 – 2021-02-19 (×45): 0.5 mg via INTRAVENOUS
  Filled 2021-02-10 (×4): qty 0.5
  Filled 2021-02-10: qty 1
  Filled 2021-02-10: qty 0.5
  Filled 2021-02-10: qty 1
  Filled 2021-02-10 (×3): qty 0.5
  Filled 2021-02-10 (×2): qty 1
  Filled 2021-02-10 (×10): qty 0.5
  Filled 2021-02-10: qty 1
  Filled 2021-02-10 (×2): qty 0.5
  Filled 2021-02-10 (×3): qty 1
  Filled 2021-02-10 (×5): qty 0.5
  Filled 2021-02-10: qty 1
  Filled 2021-02-10 (×2): qty 0.5
  Filled 2021-02-10: qty 1
  Filled 2021-02-10: qty 0.5
  Filled 2021-02-10 (×3): qty 1
  Filled 2021-02-10 (×4): qty 0.5

## 2021-02-10 MED ORDER — TRAMADOL HCL 50 MG PO TABS
50.0000 mg | ORAL_TABLET | Freq: Four times a day (QID) | ORAL | Status: DC
Start: 2021-02-10 — End: 2021-02-22
  Administered 2021-02-10 – 2021-02-22 (×40): 50 mg via ORAL
  Filled 2021-02-10 (×40): qty 1
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
## 2021-02-10 NOTE — TOC Progression Note (Signed)
Transition of Care [facility]) - Progression Note    Patient Details  Name: Kerry Young MRN: [id number] Date of Birth: [birth year]  Transition of [facility]) CM/SW Contact  Glennon Mac, RN Phone Number: 02/10/2021, 4:40 PM  Clinical Narrative:   Updated FL2 with PASSR number.  Currently no SNF bed offers; will follow with updates as available.      Expected Discharge Plan: Skilled Nursing Facility Barriers to Discharge: Continued Medical Work up  Expected Discharge Plan and Services Expected Discharge Plan: Skilled Nursing Facility   Discharge Planning Services: CM Consult   Living arrangements for the past 2 months: Single Family Home                                       Social Determinants of Health (SDOH) Interventions    Readmission Risk Interventions No flowsheet data found.  Quintella Baton, RN, BSN  Trauma/Neuro ICU Case Manager [phone]

## 2021-02-10 NOTE — Progress Notes (Signed)
Prior note by TRN documented on wrong chart Anell Barr, RN

## 2021-02-10 NOTE — Progress Notes (Deleted)
..  Trauma Response Nurse Note-  Reason for Call    Start suction for chest tube  Interventions: Right chest tube attached to suction at 20. -  Plan of Care as of this note: Continue to observe and document drainage and assessment of chest tube   Anell Barr, RN  Trauma Response Nurse [phone]
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
## 2021-02-10 NOTE — NC FL2 (Addendum)
[place] MEDICAID FL2 LEVEL OF CARE SCREENING TOOL     IDENTIFICATION  Patient Name: Kerry Young Birthdate: Jan 23, 1966 Sex: female Admission Date (Current Location): 02/04/2021  [facility] and [place] Number:  Producer, television/film/video and Address:  [facility], [address] [place], [place] [address]      Provider Number: [id number]  Attending Physician Name and Address:  Md, Trauma, MD  Relative Name and Phone Number:  Alferd Patee, significant other, [phone]    Current Level of Care: Hospital Recommended Level of Care: Skilled Nursing Facility Prior Approval Number:    Date Approved/Denied:   PASRR Number: [id number]  Discharge Plan: Home    Current Diagnoses: Patient Active Problem List   Diagnosis Date Noted   Proximal humerus fracture 02/04/2021    Orientation RESPIRATION BLADDER Height & Weight     Self, Time, Place, Situation  Normal Continent Weight: 81.6 kg Height:  5\' 6"  (167.6 cm)  BEHAVIORAL SYMPTOMS/MOOD NEUROLOGICAL BOWEL NUTRITION STATUS      Continent Diet (carb modified)  AMBULATORY STATUS COMMUNICATION OF NEEDS Skin   Extensive Assist Verbally Normal                       Personal Care Assistance Level of Assistance  Bathing, Feeding, Dressing Bathing Assistance: Maximum assistance Feeding assistance: Limited assistance Dressing Assistance: Maximum assistance     Functional Limitations Info             SPECIAL CARE FACTORS FREQUENCY  OT (By licensed OT), PT (By licensed PT)     PT Frequency: 5x weekly OT Frequency: 5x weekly            Contractures Contractures Info: Not present    Additional Factors Info  Code Status, Allergies Code Status Info: Full Allergies Info: citalopram, naproxen, oxycodone, zofran           Current Medications (02/10/2021):  This is the current hospital active medication list Current Facility-Administered Medications  Medication Dose Route Frequency Provider  Last Rate Last Admin   0.9 % NaCl with KCl 20 mEq/ L  infusion   Intravenous Continuous 02/12/2021, PA-C 50 mL/hr at 02/07/21 1644 New Bag at 02/07/21 1644   acetaminophen (TYLENOL) tablet 1,000 mg  1,000 mg Oral Q6H McBane, Caroline N, PA-C   1,000 mg at 02/10/21 1202   ALPRAZolam (XANAX) tablet 1 mg  1 mg Oral BID PRN 02/12/21, PA-C   1 mg at 02/10/21 02/12/21   aspirin EC tablet 81 mg  81 mg Oral Daily 8469, PA-C   81 mg at 02/10/21 02/12/21   clopidogrel (PLAVIX) tablet 75 mg  75 mg Oral Daily 6295, PA-C   75 mg at 02/10/21 0912   enoxaparin (LOVENOX) injection 30 mg  30 mg Subcutaneous Q12H 02/12/21, PA-C   30 mg at 02/10/21 02/12/21   HYDROmorphone (DILAUDID) injection 0.5 mg  0.5 mg Intravenous Q4H PRN 2841, PA-C       ibuprofen (ADVIL) tablet 600 mg  600 mg Oral Q6H PRN Eric Form, PA-C       lidocaine (LIDODERM) 5 % 2 patch  2 patch Transdermal Q24H Eric Form, PA-C   2 patch at 02/10/21 0912   methocarbamol (ROBAXIN) tablet 1,000 mg  1,000 mg Oral QID 02/12/21, PA-C   1,000 mg at 02/10/21 1546   oxyCODONE (Oxy IR/ROXICODONE) immediate release tablet 10 mg  10 mg Oral Q4H PRN Vernetta Honey, PA-C   10 mg at 02/07/21 1847   oxyCODONE (Oxy IR/ROXICODONE) immediate release tablet 15 mg  15 mg Oral Q4H PRN Vernetta Honey, PA-C   15 mg at 02/10/21 1546   pantoprazole (PROTONIX) EC tablet 40 mg  40 mg Oral Daily Vernetta Honey, PA-C   40 mg at 02/10/21 0912   pregabalin (LYRICA) capsule 300 mg  300 mg Oral BID Vernetta Honey, PA-C   300 mg at 02/10/21 4235   prochlorperazine (COMPAZINE) tablet 10 mg  10 mg Oral Q6H PRN Vernetta Honey, PA-C       Or   prochlorperazine (COMPAZINE) injection 5-10 mg  5-10 mg Intravenous Q6H PRN Vernetta Honey, PA-C   10 mg at 02/06/21 0051   traMADol (ULTRAM) tablet 50 mg  50 mg Oral Q6H Eric Form, PA-C         Discharge Medications: Please see  discharge summary for a list of discharge medications.  Relevant Imaging Results:  Relevant Lab Results:   Additional Information   Quintella Baton, RN, BSN  Trauma/Neuro ICU Case Manager [phone]
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
## 2021-02-11 ENCOUNTER — Encounter (HOSPITAL_COMMUNITY): Payer: Self-pay | Admitting: Orthopaedic Surgery

## 2021-02-11 MED ORDER — POLYETHYLENE GLYCOL 3350 17 G PO PACK
17.0000 g | PACK | Freq: Every day | ORAL | Status: DC
  Administered 2021-02-14: 17 g via ORAL
  Filled 2021-02-11 (×8): qty 1

## 2021-02-11 MED ORDER — HYDROCODONE-ACETAMINOPHEN 10-325 MG PO TABS
1.0000 | ORAL_TABLET | ORAL | Status: DC | PRN
  Administered 2021-02-11 – 2021-02-19 (×35): 1 via ORAL
  Filled 2021-02-11 (×39): qty 1

## 2021-02-11 MED ORDER — HYDROCODONE-ACETAMINOPHEN 7.5-325 MG PO TABS
1.0000 | ORAL_TABLET | ORAL | Status: DC | PRN
  Administered 2021-02-16 (×2): 1 via ORAL
  Filled 2021-02-11 (×3): qty 1

## 2021-02-11 MED ORDER — DOCUSATE SODIUM 100 MG PO CAPS
100.0000 mg | ORAL_CAPSULE | Freq: Two times a day (BID) | ORAL | Status: DC
  Administered 2021-02-11 – 2021-02-13 (×4): 100 mg via ORAL
  Filled 2021-02-11 (×4): qty 1

## 2021-02-11 MED ORDER — UBROGEPANT 100 MG PO TABS
100.0000 mg | ORAL_TABLET | Freq: Every day | ORAL | Status: DC | PRN
  Administered 2021-02-11 – 2021-02-17 (×4): 100 mg via ORAL
  Filled 2021-02-11 (×7): qty 1

## 2021-02-11 MED ORDER — NON FORMULARY: 100.0000 mg | Freq: Every day | Status: DC | PRN
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
## 2021-02-11 NOTE — TOC Progression Note (Signed)
Transition of Care [facility]) - Progression Note    Patient Details  Name: Kelina Beauchamp MRN: [id number] Date of Birth: 1966/06/24  [facility] of [facility]) CM/SW Contact  Glennon Mac, RN Phone Number: 02/11/2021, 3:21 PM  Clinical Narrative: Patient still with no bed offers.  Left messages for [facility] [place], [address], [facility] [place], [place], and [place] SNFs, requesting review.  Will follow-up with updates as they are available.    Expected Discharge Plan: Skilled Nursing Facility Barriers to Discharge: Continued Medical Work up  Expected Discharge Plan and Services Expected Discharge Plan: Skilled Nursing Facility   Discharge Planning Services: CM Consult   Living arrangements for the past 2 months: Single Family Home                                       Social Determinants of Health (SDOH) Interventions    Readmission Risk Interventions No flowsheet data found.  Quintella Baton, RN, BSN  Trauma/Neuro ICU Case Manager [phone]

## 2021-02-12 ENCOUNTER — Inpatient Hospital Stay (HOSPITAL_COMMUNITY): Payer: Medicaid Other

## 2021-02-12 ENCOUNTER — Other Ambulatory Visit: Payer: Self-pay

## 2021-02-12 LAB — COMPREHENSIVE METABOLIC PANEL
ALT: 22 U/L (ref 0–44)
AST: 20 U/L (ref 15–41)
Albumin: 2.7 g/dL — ABNORMAL LOW (ref 3.5–5.0)
Alkaline Phosphatase: 120 U/L (ref 38–126)
Anion gap: 9 (ref 5–15)
BUN: 11 mg/dL (ref 6–20)
CO2: 21 mmol/L — ABNORMAL LOW (ref 22–32)
Calcium: 9.1 mg/dL (ref 8.9–10.3)
Chloride: 101 mmol/L (ref 98–111)
Creatinine, Ser: 0.51 mg/dL (ref 0.44–1.00)
GFR, Estimated: >60 mL/min (ref >60)
Glucose, Bld: 109 mg/dL — ABNORMAL HIGH (ref 70–99)
Potassium: 4.4 mmol/L (ref 3.5–5.1)
Sodium: 131 mmol/L — ABNORMAL LOW (ref 135–145)
Total Bilirubin: 1.5 mg/dL — ABNORMAL HIGH (ref 0.3–1.2)
Total Protein: 6 g/dL — ABNORMAL LOW (ref 6.5–8.1)

## 2021-02-12 MED ORDER — IOHEXOL 350 MG/ML SOLN
58.0000 mL | Freq: Once | INTRAVENOUS | Status: AC | PRN
  Administered 2021-02-12: 58 mL via INTRAVENOUS
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
## 2021-02-12 NOTE — Progress Notes (Signed)
   02/12/21 2031  Assess: MEWS Score  Temp 98 F (36.7 C)  BP (!) 148/86  Pulse Rate 100  ECG Heart Rate (!) 101  Resp (!) 22  Level of Consciousness Alert  SpO2 97 %  O2 Device Nasal Cannula  O2 Flow Rate (L/min) 2 L/min  Assess: MEWS Score  MEWS Temp 0  MEWS Systolic 0  MEWS Pulse 1  MEWS RR 1  MEWS LOC 0  MEWS Score 2  MEWS Score Color Yellow  Assess: if the MEWS score is Yellow or Red  Were vital signs taken at a resting state? Yes  Focused Assessment No change from prior assessment  Early Detection of Sepsis Score *See Row Information* Low  MEWS guidelines implemented *See Row Information* Yes  Take Vital Signs  Increase Vital Sign Frequency  Yellow: Q 2hr X 2 then Q 4hr X 2, if remains yellow, continue Q 4hrs  Escalate  MEWS: Escalate Yellow: discuss with charge nurse/RN and consider discussing with provider and RRT  Notify: Charge Nurse/RN  Name of Charge Nurse/RN Notified Geannie Risen, RN  Date Charge Nurse/RN Notified 02/12/21  Document  Patient Outcome Other (Comment)  Progress note created (see row info) Yes
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
## 2021-02-12 NOTE — Progress Notes (Signed)
At about 1810 pt called out saying that her right leg had increased in swelling and that she felt shooting/throbbing throughout her ankle and leg. Leg does appear swollen and radiating heat to touch. Dr. Dwain Sarna was text-paged and ordered an ultrasound of rt lower extremity. Approximately 5 minutes later, pt called out saying that she was feeling short of breath and had chest tightness. Vitals WNL, but pt does appear pale. Text-paged Dr. Dwain Sarna again and received orders for STAT chest CT, EKG, and agreed for RN to administer Xanax. EKG performed by this RN.   Robina Ade, RN

## 2021-02-12 NOTE — Progress Notes (Signed)
Contacted [place] to follow up on SNF. Isidoro Donning RN CCM, [facility] ED TOC CM [phone]

## 2021-02-12 NOTE — TOC Progression Note (Signed)
Transition of Care [facility]) - Progression Note    Patient Details  Name: Kerry Young MRN: [id number] Date of Birth: 1966-03-06  Transition of Care [facility]) CM/SW Contact  Ella Bodo, RN Phone Number: 02/12/2021, 3:52 PM  Clinical Narrative:   Met with patient to discuss status of SNF bed search; patient made aware that currently she has no bed offers in the area.  She is reluctantly agreeable to faxing her information out to a larger area.  She states that she has no one to assist her at home, as her boyfriend is a long range truck driver.  Faxed patient clinical information to surrounding counties.  Will follow with updates as they are available.    Expected Discharge Plan: [place] Barriers to Discharge: Continued Medical Work up  Expected Discharge Plan and Services Expected Discharge Plan: [place]   Discharge Planning Services: CM Consult   Living arrangements for the past 2 months: Single Family Home                                       Social Determinants of Health (SDOH) Interventions    Readmission Risk Interventions No flowsheet data found.  Reinaldo Raddle, RN, BSN  Trauma/Neuro ICU Case Manager [phone]

## 2021-02-12 NOTE — Progress Notes (Signed)
Received call from [place] in [place]; pt needs new labs (CMP) and new iv before stat CTAngio can be done.

## 2021-02-13 ENCOUNTER — Encounter (HOSPITAL_COMMUNITY): Payer: Medicaid Other

## 2021-02-13 ENCOUNTER — Inpatient Hospital Stay (HOSPITAL_COMMUNITY): Payer: Medicaid Other

## 2021-02-13 DIAGNOSIS — M7989 Other specified soft tissue disorders: Secondary | ICD-10-CM | POA: Diagnosis not present

## 2021-02-13 LAB — RESP PANEL BY RT-PCR (FLU A&B, COVID) ARPGX2
Influenza A by PCR: NEGATIVE
Influenza B by PCR: NEGATIVE
SARS Coronavirus 2 by RT PCR: NEGATIVE

## 2021-02-13 MED ORDER — POLYETHYLENE GLYCOL 3350 17 G PO PACK: 17.0000 g | PACK | Freq: Once | ORAL | Status: DC

## 2021-02-13 MED ORDER — SENNOSIDES-DOCUSATE SODIUM 8.6-50 MG PO TABS
1.0000 | ORAL_TABLET | Freq: Every day | ORAL | Status: DC
  Administered 2021-02-14 – 2021-02-19 (×3): 1 via ORAL
  Filled 2021-02-13 (×6): qty 1

## 2021-02-13 MED ORDER — POLYETHYLENE GLYCOL 3350 17 G PO PACK: 17.0000 g | PACK | Freq: Every day | ORAL | Status: DC

## 2021-02-13 MED ORDER — BISACODYL 10 MG RE SUPP: 10.0000 mg | Freq: Every day | RECTAL | Status: DC | PRN

## 2021-02-13 NOTE — TOC Progression Note (Addendum)
Transition of Care [facility]) - Progression Note    Patient Details  Name: Raizy Auzenne MRN: [id number] Date of Birth: July 05, 1966  Transition of Care [facility]) CM/SW Contact  Glennon Mac, RN Phone Number: 02/13/2021, 5:01 PM  Clinical Narrative:   Patient has one bed offer at [facility], and patient is accepting of this bed offer.  Per Delorise Shiner in admissions at facility, she will initiate insurance authorization, which may take 24 to 48 hours.  Patient requests a private room, and this information has been relayed to admissions at [facility] [place].  Patient will need COVID test prior to discharge to SNF, and bedside nurse notified.  Will follow with updates on insurance auth.     Expected Discharge Plan: Skilled Nursing Facility Barriers to Discharge: Continued Medical Work up  Expected Discharge Plan and Services Expected Discharge Plan: Skilled Nursing Facility   Discharge Planning Services: CM Consult   Living arrangements for the past 2 months: Single Family Home                                       Social Determinants of Health (SDOH) Interventions    Readmission Risk Interventions No flowsheet data found.  Quintella Baton, RN, BSN  Trauma/Neuro ICU Case Manager [phone]
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
## 2021-02-13 NOTE — Progress Notes (Signed)
Right lower extremity venous duplex has been completed. Preliminary results can be found in CV Proc through chart review.   02/13/21 1:02 PM Olen Cordial RVT
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
## 2021-02-14 MED ORDER — ASPIRIN 81 MG PO TBEC: 81.0000 mg | DELAYED_RELEASE_TABLET | Freq: Every day | ORAL | 11 refills | Status: DC

## 2021-02-14 MED ORDER — SENNOSIDES-DOCUSATE SODIUM 8.6-50 MG PO TABS: 1.0000 | ORAL_TABLET | Freq: Every day | ORAL | Status: DC

## 2021-02-14 MED ORDER — IBUPROFEN 600 MG PO TABS: 600.0000 mg | ORAL_TABLET | Freq: Four times a day (QID) | ORAL | 0 refills | Status: DC | PRN

## 2021-02-14 MED ORDER — TRAMADOL HCL 50 MG PO TABS: 50.0000 mg | ORAL_TABLET | Freq: Four times a day (QID) | ORAL | 0 refills | Status: DC

## 2021-02-14 MED ORDER — POLYETHYLENE GLYCOL 3350 17 G PO PACK: 17.0000 g | PACK | Freq: Every day | ORAL | 0 refills | Status: DC

## 2021-02-14 MED ORDER — ALPRAZOLAM 1 MG PO TABS: 1.0000 mg | ORAL_TABLET | Freq: Two times a day (BID) | ORAL | 0 refills | Status: DC | PRN

## 2021-02-14 MED ORDER — PANTOPRAZOLE SODIUM 40 MG PO TBEC: 40.0000 mg | DELAYED_RELEASE_TABLET | Freq: Every day | ORAL | Status: DC

## 2021-02-14 MED ORDER — LIDOCAINE 5 % EX PTCH: 2.0000 | MEDICATED_PATCH | CUTANEOUS | 0 refills | Status: DC

## 2021-02-14 MED ORDER — HYDROCODONE-ACETAMINOPHEN 10-325 MG PO TABS: 1.0000 | ORAL_TABLET | ORAL | 0 refills | Status: DC | PRN

## 2021-02-14 MED ORDER — METHOCARBAMOL 500 MG PO TABS: 1000.0000 mg | ORAL_TABLET | Freq: Four times a day (QID) | ORAL | Status: DC

## 2021-02-14 MED ORDER — HYDROCODONE-ACETAMINOPHEN 7.5-325 MG PO TABS: 1.0000 | ORAL_TABLET | ORAL | 0 refills | Status: DC | PRN

## 2021-02-14 MED ORDER — BISACODYL 10 MG RE SUPP: 10.0000 mg | Freq: Every day | RECTAL | 0 refills | Status: DC | PRN

## 2021-02-14 MED ORDER — CLOPIDOGREL BISULFATE 75 MG PO TABS: 75.0000 mg | ORAL_TABLET | Freq: Every day | ORAL | Status: DC
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
## 2021-02-14 NOTE — Progress Notes (Addendum)
Orthopedic Tech Progress Note Patient Details:  Kerry Young 12-31-1965 [id number]  Patient ID: [name], female   DOB: 1966/05/17, 55 y.o.   MRN: [id number] Follow up: spoke to [employer] rep, Scott, and he reports she was measured and the forearm Sarmiento brace was ordered on Tues 02/11/21.  They are waiting for it to arrive as it is a custom brace that is not kept in stock in their facility. It will be delivered when shipping/supply chain allows.   Thanks,  Corinna Capra, PT, DPT  Acute Rehabilitation Ortho Tech Supervisor [phone] pager #[phone] office     Lurena Joiner B Nanie Dunkleberger 02/14/2021, 12:30 PM

## 2021-02-14 NOTE — Progress Notes (Signed)
Sarmiento brace still not available for the patient. Will order long removable wrist brace since the patient is getting discharged to SNF today.   Follow-up in office in 3 weeks.  [name], PA-C 02/14/2021
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
## 2021-02-14 NOTE — Progress Notes (Signed)
Orthopedic Tech Progress Note Patient Details:  Kerry Young 1965-12-04 [id number] Sarmiento forearm brace has been ordered from [employer]  Patient ID: [name], female   DOB: 04-22-1966, 55 y.o.   MRN: [id number]  Smitty Pluck 02/14/2021, 12:18 PM

## 2021-02-15 ENCOUNTER — Inpatient Hospital Stay (HOSPITAL_COMMUNITY): Payer: Medicaid Other
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
## 2021-02-16 NOTE — Progress Notes (Signed)
Subjective: Still having pain. She had more forearm pain after feeling a pop in the left arm when being transferred to the bathroom. She does not have the removable brace on because she states it makes her pain worse. She can't use her left arm now because she feels her "bones popping."  Objective:  PE: VITALS:   Vitals:   02/15/21 2012 02/16/21 0042 02/16/21 0443 02/16/21 0900  BP: 116/76 109/74 98/62 128/69  Pulse: 89 91 90 98  Resp: 17  18   Temp: 98.3 F (36.8 C) 98.4 F (36.9 C) 98.3 F (36.8 C) 98.5 F (36.9 C)  TempSrc: Oral Oral Oral Oral  SpO2: 97% 94% 97% 98%  Weight:      Height:       General: sitting up in hospital bed, NAD RUE: incisions CDI - steri-strips in place, she endorses axillary nerve sensation, endorses distal sensation, + Motor in  AIN, PIN, Ulnar distributions. Well perfused digits. 2+ radial pulse  LUE: swelling throughout forearm, no brace on, diffusely TTP left forearm. endorses distal sensation,difficulty with testing motor function on the left arm due to pain, warm well perfused hand  RLE - incisions CDI, steri-strips in place tender to palpation knee to ankle, complains of pain with ROM, only tolerates about 30 degrees of flexion today, compartments soft and compressible, no pain with passive stretch, swelling of right ankle and foot, Able to flex and extend all toes, dorsiflexion and plantarflexion intact, endorses distal sensation, mepilex on heel covering blister, + DP pulse, warm well perfused digits  LABS  No results found for this or any previous visit (from the past 24 hour(s)).  DG Forearm Left  Result Date: 02/15/2021 CLINICAL DATA:  Pt c/o left forearm pain after feeling a pop in her arm while transferring to the toilet today. Hx of prior ulnar fx 2 weeks ago. EXAM: LEFT FOREARM - 2 VIEW COMPARISON:  Left forearm radiographs 02/12/2021 FINDINGS: Redemonstrated transverse fracture of the mid/proximal left ulna. There may be slight  increased displacement now measuring 0.6 cm, previously 0.4 cm. No additional acute finding. Mild associated soft tissue swelling. IMPRESSION: Redemonstrated transverse fracture of the mid/proximal left ulna. There may be slight increased displacement by 0.2 cm from prior radiograph. Electronically Signed   By: Emmaline Kluver M.D.   On: 02/15/2021 12:49    Repeat forearm xrays from yesterday show slight increased displacement of ulna shaft fracture.  Assessment/Plan: Right tibia fracture - 7/20 - Right tibia IM Nail by Dr. Everardo Pacific - POD #11  - NWB RLE, okay to leave incisions open to air, keep clean and dry  Right proximal humerus fracture - 7/22 - Right proximal humerus ORIF with arthroscopic rotator cuff repair - POD#9 - NWB RUE, sling at all times - discussed the importance of sling usage, especially when patient is out of the bed - discussed the change of non-union/malunion with the patient and how not wearing the sling increases that risk - okay to leave incisions open to air, keep clean and dry   Left ulna fracture - sarmiento forearm splint has not been sent to the room yet  - removable brace was ordered for protection - she has not been wearing due to increased pain  - patient had increased pain when being transferred to the bathroom  - new x-rays show slightly increased displacement of ulnar shaft fracture compared to previous - patient wants to consider surgical fixation - will discuss this with Dr. Everardo Pacific - will  order dorsal + volar slab splint since patient is intolerant of removable brace  Pain control and VTE prophylaxis - per trauma Continue PT/OT Follow up in 3 weeks in office  Dispo: PT/OT recommending SNF. TOC following. Waiting on bed offers.   Jerald Kief Reuben Knoblock 02/16/2021, 12:07 PM
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
## 2021-02-17 ENCOUNTER — Inpatient Hospital Stay (HOSPITAL_COMMUNITY): Payer: Medicaid Other

## 2021-02-17 LAB — BASIC METABOLIC PANEL
Anion gap: 7 (ref 5–15)
BUN: 8 mg/dL (ref 6–20)
CO2: 28 mmol/L (ref 22–32)
Calcium: 9 mg/dL (ref 8.9–10.3)
Chloride: 100 mmol/L (ref 98–111)
Creatinine, Ser: 0.61 mg/dL (ref 0.44–1.00)
GFR, Estimated: >60 mL/min (ref >60)
Glucose, Bld: 103 mg/dL — ABNORMAL HIGH (ref 70–99)
Potassium: 3.8 mmol/L (ref 3.5–5.1)
Sodium: 135 mmol/L (ref 135–145)

## 2021-02-17 LAB — CBC
HCT: 30 % — ABNORMAL LOW (ref 36.0–46.0)
Hemoglobin: 9.5 g/dL — ABNORMAL LOW (ref 12.0–15.0)
MCH: 29.6 pg (ref 26.0–34.0)
MCHC: 31.7 g/dL (ref 30.0–36.0)
MCV: 93.5 fL (ref 80.0–100.0)
Platelets: 548 10*3/uL — ABNORMAL HIGH (ref 150–400)
RBC: 3.21 MIL/uL — ABNORMAL LOW (ref 3.87–5.11)
RDW: 14.6 % (ref 11.5–15.5)
WBC: 10 10*3/uL (ref 4.0–10.5)
nRBC: 0 % (ref 0.0–0.2)

## 2021-02-17 MED ORDER — DEXAMETHASONE SODIUM PHOSPHATE 10 MG/ML IJ SOLN
8.0000 mg | Freq: Once | INTRAMUSCULAR | Status: AC
  Administered 2021-02-18: 8 mg via INTRAVENOUS
  Filled 2021-02-17: qty 0.8

## 2021-02-17 MED ORDER — CEFAZOLIN SODIUM-DEXTROSE 2-4 GM/100ML-% IV SOLN
2.0000 g | INTRAVENOUS | Status: AC
  Administered 2021-02-18: 2 g via INTRAVENOUS
  Filled 2021-02-17: qty 100

## 2021-02-17 MED ORDER — POVIDONE-IODINE 10 % EX SWAB
2.0000 "application " | Freq: Once | CUTANEOUS | Status: AC
  Administered 2021-02-18: 2 via TOPICAL

## 2021-02-17 NOTE — Progress Notes (Signed)
Prevalon boots applied to bilateral extremities

## 2021-02-17 NOTE — Progress Notes (Signed)
Update: Patient wants surgery and does not want to continue non-operative measures. Patient to be scheduled for ORIF left ulna on 02/18/2021 with Dr. Murphy. Okay to eat today.  - NPO at midnight - Will follow-up with Ortho techs to see status of patient's splint  Santiago Graf, PA-C 02/17/2021 

## 2021-02-17 NOTE — Progress Notes (Signed)
Orthopedic Tech Progress Note Patient Details:  Kerry Young Nov 24, 1965 [id number]  Ortho Devices Type of Ortho Device: Other (comment) Ortho Device/Splint Location: LUE Ortho Device/Splint Interventions: Ordered, Application, Adjustment   Post Interventions Patient Tolerated: Well Instructions Provided: Care of device  [name] 02/17/2021, 4:20 PM

## 2021-02-17 NOTE — TOC Progression Note (Signed)
Transition of Care [facility]) - Progression Note    Patient Details  Name: Kerry Young MRN: [id number] Date of Birth: Aug 02, 1965  Transition of Care [facility]) CM/SW Contact  Glennon Mac, RN Phone Number: 02/17/2021, 5:04 PM  Clinical Narrative:   Noted plan for ORIF of Lt ulna on 02/18/2021; insurance authorization remains pending for SNF bed at [facility].  Will follow with updates as pt progresses.     Expected Discharge Plan: Skilled Nursing Facility Barriers to Discharge: Continued Medical Work up  Expected Discharge Plan and Services Expected Discharge Plan: Skilled Nursing Facility   Discharge Planning Services: CM Consult   Living arrangements for the past 2 months: Single Family Home                                       Social Determinants of Health (SDOH) Interventions    Readmission Risk Interventions No flowsheet data found.  Quintella Baton, RN, BSN  Trauma/Neuro ICU Case Manager [phone]

## 2021-02-17 NOTE — Progress Notes (Signed)
Subjective: Still having pain. She had more forearm pain after feeling a pop in the left arm when being transferred to the bathroom on Saturday. She does not have the removable brace on because she states it makes her pain worse. She wants surgery. Splint was not applied yesterday.   Objective:  PE: VITALS:   Vitals:   02/16/21 0900 02/16/21 1700 02/16/21 2026 02/17/21 0523  BP: 128/69 (!) 103/57 119/72 117/64  Pulse: 98 86 86 90  Resp:  18 17 18   Temp: 98.5 F (36.9 C) 98.1 F (36.7 C) 98.2 F (36.8 C) 98 F (36.7 C)  TempSrc: Oral Oral Oral Oral  SpO2: 98% 97% 96% 96%  Weight:      Height:       General: sitting up in hospital bed, NAD RUE: incisions CDI - steri-strips in place, she endorses axillary nerve sensation, endorses distal sensation, + Motor in  AIN, PIN, Ulnar distributions. Well perfused digits. 2+ radial pulse  LUE: swelling throughout forearm, no brace on, diffusely TTP left forearm. endorses distal sensation,difficulty with testing motor function on the left arm due to pain, warm well perfused hand  RLE - incisions CDI, steri-strips in place tender to palpation knee to ankle, complains of pain with ROM, only tolerates about 30 degrees of flexion today, compartments soft and compressible, no pain with passive stretch, swelling of right ankle and foot, Able to flex and extend all toes, dorsiflexion and plantarflexion intact, endorses distal sensation, mepilex on heel covering blister, + DP pulse, warm well perfused digits  LABS  No results found for this or any previous visit (from the past 24 hour(s)).  DG Forearm Left  Result Date: 02/15/2021 CLINICAL DATA:  Pt c/o left forearm pain after feeling a pop in her arm while transferring to the toilet today. Hx of prior ulnar fx 2 weeks ago. EXAM: LEFT FOREARM - 2 VIEW COMPARISON:  Left forearm radiographs 02/12/2021 FINDINGS: Redemonstrated transverse fracture of the mid/proximal left ulna. There may be slight  increased displacement now measuring 0.6 cm, previously 0.4 cm. No additional acute finding. Mild associated soft tissue swelling. IMPRESSION: Redemonstrated transverse fracture of the mid/proximal left ulna. There may be slight increased displacement by 0.2 cm from prior radiograph. Electronically Signed   By: 02/14/2021 M.D.   On: 02/15/2021 12:49    Repeat forearm xrays from yesterday show slight increased displacement of ulna shaft fracture.  Assessment/Plan: Right tibia fracture - 7/20 - Right tibia IM Nail by Dr. 8/20 - POD #12  - NWB RLE - okay to leave incisions open to air, keep clean and dry  Right proximal humerus fracture - 7/22 - Right proximal humerus ORIF with arthroscopic rotator cuff repair - POD#10 - NWB RUE, sling at all times - discussed the importance of sling usage, especially when patient is out of the bed - discussed the change of non-union/malunion with the patient and how not wearing the sling increases that risk - okay to leave incisions open to air, keep clean and dry   Left ulna fracture - sarmiento forearm splint has not been sent to the room yet  - removable brace was ordered for protection - she has not been wearing due to increased pain  - patient had increased pain when being transferred to the bathroom  - new x-rays show slightly increased displacement of ulnar shaft fracture compared to previous - patient wants to consider surgical fixation - will order dorsal + volar slab splint since  patient is intolerant of removable brace - discussed ORIF left ulna with the patient   The risks benefits and alternatives were discussed with the patient including but not limited to the risks of nonoperative treatment, versus surgical intervention including infection, bleeding, nerve injury, periprosthetic fracture, the need for revision surgery, blood clots, cardiopulmonary complications, morbidity, mortality, among others, and they were willing to proceed.     - Surgery depending on OR availability - possibly today with Dr. Everardo Pacific versus Tuesday with Dr. Eulah Pont - keep NPO for now   Pain control and VTE prophylaxis - per trauma Continue PT/OT Follow up in 3 weeks in office  Dispo: PT/OT recommending SNF. TOC following. Waiting on bed offers.   Kerry Young 02/17/2021, 6:59 AM
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
## 2021-02-17 NOTE — H&P (View-Only) (Signed)
Update: Patient wants surgery and does not want to continue non-operative measures. Patient to be scheduled for ORIF left ulna on 02/18/2021 with Dr. Eulah Pont. Okay to eat today.  - NPO at midnight - Will follow-up with Ortho techs to see status of patient's splint  [name], PA-C 02/17/2021
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
## 2021-02-18 ENCOUNTER — Encounter (HOSPITAL_COMMUNITY): Payer: Self-pay | Admitting: General Surgery

## 2021-02-18 ENCOUNTER — Inpatient Hospital Stay (HOSPITAL_COMMUNITY): Payer: Medicaid Other | Admitting: Anesthesiology

## 2021-02-18 ENCOUNTER — Encounter (HOSPITAL_COMMUNITY): Admission: EM | Disposition: A | Discharge status: Skilled Nursing Facility | Payer: Self-pay | Source: Home / Self Care

## 2021-02-18 ENCOUNTER — Inpatient Hospital Stay (HOSPITAL_COMMUNITY): Payer: Medicaid Other

## 2021-02-18 HISTORY — PX: ORIF ULNAR FRACTURE: SHX5417

## 2021-02-18 LAB — TYPE AND SCREEN
ABO/RH(D): O POS
Antibody Screen: NEGATIVE

## 2021-02-18 LAB — ABO/RH: ABO/RH(D): O POS

## 2021-02-18 SURGERY — OPEN REDUCTION INTERNAL FIXATION (ORIF) ULNAR FRACTURE
Anesthesia: Monitor Anesthesia Care | Site: Arm Lower | Laterality: Left

## 2021-02-18 MED ORDER — LACTATED RINGERS IV SOLN: INTRAVENOUS | Status: DC

## 2021-02-18 MED ORDER — ORAL CARE MOUTH RINSE: 15.0000 mL | Freq: Once | OROMUCOSAL | Status: AC

## 2021-02-18 MED ORDER — CHLORHEXIDINE GLUCONATE 0.12 % MT SOLN
OROMUCOSAL | Status: AC
  Administered 2021-02-18: 15 mL via OROMUCOSAL
  Filled 2021-02-18: qty 15

## 2021-02-18 MED ORDER — BUPIVACAINE-EPINEPHRINE (PF) 0.5% -1:200000 IJ SOLN
INTRAMUSCULAR | Status: DC | PRN
  Administered 2021-02-18: 25 mL via PERINEURAL

## 2021-02-18 MED ORDER — PROPOFOL 10 MG/ML IV BOLUS
INTRAVENOUS | Status: DC | PRN
  Administered 2021-02-18: 100 mg via INTRAVENOUS

## 2021-02-18 MED ORDER — PROPOFOL 500 MG/50ML IV EMUL
INTRAVENOUS | Status: DC | PRN
  Administered 2021-02-18: 150 ug/kg/min via INTRAVENOUS

## 2021-02-18 MED ORDER — CEFAZOLIN SODIUM-DEXTROSE 2-4 GM/100ML-% IV SOLN
2.0000 g | Freq: Four times a day (QID) | INTRAVENOUS | Status: AC
Start: 2021-02-18 — End: 2021-02-19
  Administered 2021-02-18 – 2021-02-19 (×3): 2 g via INTRAVENOUS
  Filled 2021-02-18 (×3): qty 100

## 2021-02-18 MED ORDER — MONTELUKAST SODIUM 10 MG PO TABS
10.0000 mg | ORAL_TABLET | Freq: Every day | ORAL | Status: DC
  Administered 2021-02-18 – 2021-02-22 (×5): 10 mg via ORAL
  Filled 2021-02-18 (×5): qty 1

## 2021-02-18 MED ORDER — FENTANYL CITRATE (PF) 100 MCG/2ML IJ SOLN
200.0000 ug | Freq: Once | INTRAMUSCULAR | Status: AC
Start: 2021-02-18 — End: 2021-02-18
  Administered 2021-02-18: 200 ug via INTRAVENOUS

## 2021-02-18 MED ORDER — MIDAZOLAM HCL 2 MG/2ML IJ SOLN
INTRAMUSCULAR | Status: AC
  Filled 2021-02-18: qty 2

## 2021-02-18 MED ORDER — TIZANIDINE HCL 2 MG PO TABS
4.0000 mg | ORAL_TABLET | Freq: Three times a day (TID) | ORAL | Status: DC
  Administered 2021-02-18 – 2021-02-22 (×13): 4 mg via ORAL
  Filled 2021-02-18 (×14): qty 2

## 2021-02-18 MED ORDER — CHLORHEXIDINE GLUCONATE 0.12 % MT SOLN: 15.0000 mL | Freq: Once | OROMUCOSAL | Status: AC

## 2021-02-18 MED ORDER — MIDAZOLAM HCL 2 MG/2ML IJ SOLN
2.0000 mg | Freq: Once | INTRAMUSCULAR | Status: AC
  Administered 2021-02-18: 2 mg via INTRAVENOUS

## 2021-02-18 MED ORDER — FENTANYL CITRATE (PF) 100 MCG/2ML IJ SOLN
INTRAMUSCULAR | Status: DC | PRN
  Administered 2021-02-18: 200 ug via INTRAVENOUS
  Administered 2021-02-18: 50 ug via INTRAVENOUS

## 2021-02-18 MED ORDER — LIDOCAINE HCL (PF) 2 % IJ SOLN
INTRAMUSCULAR | Status: DC | PRN
  Administered 2021-02-18: 100 mg via PERINEURAL

## 2021-02-18 MED ORDER — BUPIVACAINE HCL (PF) 0.25 % IJ SOLN
INTRAMUSCULAR | Status: AC
  Filled 2021-02-18: qty 30

## 2021-02-18 MED ORDER — FENTANYL CITRATE (PF) 250 MCG/5ML IJ SOLN
INTRAMUSCULAR | Status: AC
  Filled 2021-02-18: qty 5

## 2021-02-18 MED ORDER — 0.9 % SODIUM CHLORIDE (POUR BTL) OPTIME
TOPICAL | Status: DC | PRN
  Administered 2021-02-18: 1000 mL

## 2021-02-18 MED ORDER — METOCLOPRAMIDE HCL 5 MG/ML IJ SOLN: 5.0000 mg | Freq: Three times a day (TID) | INTRAMUSCULAR | Status: DC | PRN

## 2021-02-18 MED ORDER — METOCLOPRAMIDE HCL 5 MG PO TABS: 5.0000 mg | ORAL_TABLET | Freq: Three times a day (TID) | ORAL | Status: DC | PRN

## 2021-02-18 MED ORDER — UBROGEPANT 100 MG PO TABS: 1.0000 | ORAL_TABLET | Freq: Every day | ORAL | Status: DC | PRN

## 2021-02-18 MED ORDER — MIDAZOLAM HCL 5 MG/5ML IJ SOLN
INTRAMUSCULAR | Status: DC | PRN
  Administered 2021-02-18: 2 mg via INTRAVENOUS

## 2021-02-18 MED ORDER — DOCUSATE SODIUM 100 MG PO CAPS
100.0000 mg | ORAL_CAPSULE | Freq: Two times a day (BID) | ORAL | Status: DC
  Administered 2021-02-18 – 2021-02-22 (×7): 100 mg via ORAL
  Filled 2021-02-18 (×8): qty 1

## 2021-02-18 MED ORDER — PREGABALIN 100 MG PO CAPS: 300.0000 mg | ORAL_CAPSULE | Freq: Two times a day (BID) | ORAL | Status: DC

## 2021-02-18 SURGICAL SUPPLY — 78 items
APL SKNCLS STERI-STRIP NONHPOA (GAUZE/BANDAGES/DRESSINGS)
BAG COUNTER SPONGE SURGICOUNT (BAG) IMPLANT
BAG SPNG CNTER NS LX DISP (BAG)
BENZOIN TINCTURE PRP APPL 2/3 (GAUZE/BANDAGES/DRESSINGS) IMPLANT
BIT DRILL SCALED DIA 2X135 (BIT) ×1 IMPLANT
BLADE CLIPPER SURG (BLADE) ×2 IMPLANT
BNDG CMPR 9X4 STRL LF SNTH (GAUZE/BANDAGES/DRESSINGS) ×1
BNDG COHESIVE 4X5 TAN STRL (GAUZE/BANDAGES/DRESSINGS) ×2 IMPLANT
BNDG ELASTIC 3X5.8 VLCR STR LF (GAUZE/BANDAGES/DRESSINGS) ×2 IMPLANT
BNDG ELASTIC 4X5.8 VLCR STR LF (GAUZE/BANDAGES/DRESSINGS) ×2 IMPLANT
BNDG ESMARK 4X9 LF (GAUZE/BANDAGES/DRESSINGS) ×2 IMPLANT
BNDG GAUZE ELAST 4 BULKY (GAUZE/BANDAGES/DRESSINGS) ×2 IMPLANT
CANISTER SUCT 3000ML PPV (MISCELLANEOUS) IMPLANT
CONNECTOR 5 IN 1 STRAIGHT STRL (MISCELLANEOUS) ×2 IMPLANT
COVER SURGICAL LIGHT HANDLE (MISCELLANEOUS) ×2 IMPLANT
CUFF TOURN SGL QUICK 18X4 (TOURNIQUET CUFF) ×2 IMPLANT
CUFF TOURN SGL QUICK 24 (TOURNIQUET CUFF) ×2
CUFF TRNQT CYL 24X4X16.5-23 (TOURNIQUET CUFF) ×1 IMPLANT
DRAPE IMP U-DRAPE 54X76 (DRAPES) ×2 IMPLANT
DRAPE INCISE IOBAN 66X45 STRL (DRAPES) IMPLANT
DRAPE OEC MINIVIEW 54X84 (DRAPES) ×2 IMPLANT
DRAPE U-SHAPE 47X51 STRL (DRAPES) ×2 IMPLANT
DRSG EMULSION OIL 3X3 NADH (GAUZE/BANDAGES/DRESSINGS) ×1 IMPLANT
DRSG PAD ABDOMINAL 8X10 ST (GAUZE/BANDAGES/DRESSINGS) ×4 IMPLANT
DURAPREP 26ML APPLICATOR (WOUND CARE) ×2 IMPLANT
ELECT REM PT RETURN 9FT ADLT (ELECTROSURGICAL) ×2
ELECTRODE REM PT RTRN 9FT ADLT (ELECTROSURGICAL) ×1 IMPLANT
GAUZE SPONGE 4X4 12PLY STRL (GAUZE/BANDAGES/DRESSINGS) ×2 IMPLANT
GAUZE XEROFORM 5X9 LF (GAUZE/BANDAGES/DRESSINGS) ×2 IMPLANT
GLOVE SRG 8 PF TXTR STRL LF DI (GLOVE) ×1 IMPLANT
GLOVE SURG ENC MOIS LTX SZ7.5 (GLOVE) ×2 IMPLANT
GLOVE SURG SYN 7.5  E (GLOVE) ×2
GLOVE SURG SYN 7.5 E (GLOVE) ×1 IMPLANT
GLOVE SURG SYN 7.5 PF PI (GLOVE) ×1 IMPLANT
GLOVE SURG UNDER POLY LF SZ7.5 (GLOVE) ×2 IMPLANT
GLOVE SURG UNDER POLY LF SZ8 (GLOVE) ×2
GOWN STRL REUS W/ TWL LRG LVL3 (GOWN DISPOSABLE) ×1 IMPLANT
GOWN STRL REUS W/ TWL XL LVL3 (GOWN DISPOSABLE) ×2 IMPLANT
GOWN STRL REUS W/TWL LRG LVL3 (GOWN DISPOSABLE) ×2
GOWN STRL REUS W/TWL XL LVL3 (GOWN DISPOSABLE) ×4
KIT BASIN OR (CUSTOM PROCEDURE TRAY) ×2 IMPLANT
KIT TURNOVER KIT B (KITS) ×2 IMPLANT
MANIFOLD NEPTUNE II (INSTRUMENTS) IMPLANT
NEEDLE HYPO 22GX1.5 SAFETY (NEEDLE) IMPLANT
NS IRRIG 1000ML POUR BTL (IV SOLUTION) ×2 IMPLANT
PACK ORTHO EXTREMITY (CUSTOM PROCEDURE TRAY) ×2 IMPLANT
PAD ARMBOARD 7.5X6 YLW CONV (MISCELLANEOUS) ×2 IMPLANT
PAD CAST 4YDX4 CTTN HI CHSV (CAST SUPPLIES) ×1 IMPLANT
PADDING CAST ABS 4INX4YD NS (CAST SUPPLIES) ×1
PADDING CAST ABS COTTON 4X4 ST (CAST SUPPLIES) IMPLANT
PADDING CAST COTTON 4X4 STRL (CAST SUPPLIES) ×2
PLATE LOCK NARROW 2.7X47 6H (Plate) ×1 IMPLANT
SCREW CANN FT STRDRV 2.7X11 (Screw) ×1 IMPLANT
SCREW CANN FT STRDRV 2.7X12 (Screw) ×3 IMPLANT
SCREW CANN FT STRDRV 2.7X14 (Screw) ×1 IMPLANT
SCREW LOCK FT STRDRV 2.7X12 (Screw) ×2 IMPLANT
SLING ARM IMMOBILIZER LRG (SOFTGOODS) IMPLANT
SPLINT PLASTER CAST XFAST 5X30 (CAST SUPPLIES) IMPLANT
SPLINT PLASTER XFAST SET 5X30 (CAST SUPPLIES) ×1
SPONGE T-LAP 18X18 ~~LOC~~+RFID (SPONGE) IMPLANT
STOCKINETTE IMPERVIOUS 9X36 MD (GAUZE/BANDAGES/DRESSINGS) ×2 IMPLANT
STRIP CLOSURE SKIN 1/2X4 (GAUZE/BANDAGES/DRESSINGS) IMPLANT
SUCTION FRAZIER HANDLE 10FR (MISCELLANEOUS) ×2
SUCTION TUBE FRAZIER 10FR DISP (MISCELLANEOUS) ×1 IMPLANT
SUT ETHILON 3 0 PS 1 (SUTURE) ×1 IMPLANT
SUT FIBERWIRE #2 38 REV NDL BL (SUTURE)
SUT MNCRL AB 4-0 PS2 18 (SUTURE) IMPLANT
SUT MON AB 2-0 CT1 36 (SUTURE) IMPLANT
SUT VIC AB 0 CT1 27 (SUTURE) ×4
SUT VIC AB 0 CT1 27XBRD ANBCTR (SUTURE) ×1 IMPLANT
SUTURE FIBERWR#2 38 REV NDL BL (SUTURE) IMPLANT
SYR CONTROL 10ML LL (SYRINGE) IMPLANT
TOWEL GREEN STERILE (TOWEL DISPOSABLE) ×2 IMPLANT
TOWEL GREEN STERILE FF (TOWEL DISPOSABLE) ×2 IMPLANT
TUBE CONNECTING 12X1/4 (SUCTIONS) ×4 IMPLANT
UNDERPAD 30X36 HEAVY ABSORB (UNDERPADS AND DIAPERS) ×2 IMPLANT
WATER STERILE IRR 1000ML POUR (IV SOLUTION) ×2 IMPLANT
YANKAUER SUCT BULB TIP NO VENT (SUCTIONS) ×2 IMPLANT

## 2021-02-18 NOTE — Anesthesia Postprocedure Evaluation (Signed)
Anesthesia Post Note  Patient: Kerry Young  Procedure(s) Performed: OPEN REDUCTION INTERNAL FIXATION (ORIF) ULNAR FRACTURE (Left: Arm Lower)     Patient location during evaluation: PACU Anesthesia Type: Regional and MAC Level of consciousness: awake and alert Pain management: pain level controlled Vital Signs Assessment: post-procedure vital signs reviewed and stable Respiratory status: spontaneous breathing, nonlabored ventilation, respiratory function stable and patient connected to nasal cannula oxygen Cardiovascular status: stable and blood pressure returned to baseline Postop Assessment: no apparent nausea or vomiting Anesthetic complications: no   No notable events documented.  Last Vitals:  Vitals:   02/18/21 1406 02/18/21 1421  BP: 139/84 (!) 161/82  Pulse: 98 79  Resp: 18 20  Temp: 36.4 C 36.8 C  SpO2: 95% 95%    Last Pain:  Vitals:   02/18/21 1421  TempSrc: Oral  PainSc: 3                  Akira Perusse     

## 2021-02-18 NOTE — Anesthesia Procedure Notes (Signed)
Anesthesia Regional Block: Axillary brachial plexus block   Pre-Anesthetic Checklist: , timeout performed,  Correct Patient, Correct Site, Correct Laterality,  Correct Procedure, Correct Position, site marked,  Risks and benefits discussed,  Surgical consent,  Pre-op evaluation,  At surgeon's request and post-op pain management  Laterality: Left and Upper  Prep: chloraprep       Needles:  Injection technique: Single-shot      Needle Length: 9cm  Needle Gauge: 22     Additional Needles: Arrow StimuQuik ECHO Echogenic Stimulating PNB Needle  Procedures:,,,, ultrasound used (permanent image in chart),,    Narrative:  Start time: 02/18/2021 11:56 AM End time: 02/18/2021 12:03 PM Injection made incrementally with aspirations every 5 mL.  Performed by: Personally  Anesthesiologist: Val Eagle, MD

## 2021-02-18 NOTE — Anesthesia Procedure Notes (Signed)
Procedure Name: MAC Date/Time: 02/18/2021 12:45 PM Performed by: Annamary Carolin, CRNA Pre-anesthesia Checklist: Patient identified, Emergency Drugs available, Suction available, Patient being monitored and Timeout performed Patient Re-evaluated:Patient Re-evaluated prior to induction Preoxygenation: Pre-oxygenation with 100% oxygen Induction Type: IV induction
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
## 2021-02-18 NOTE — Op Note (Signed)
02/04/2021 - 02/18/2021  12:32 PM  PATIENT:  Kerry Young    PRE-OPERATIVE DIAGNOSIS:  left ulna fracture  POST-OPERATIVE DIAGNOSIS:  Same  PROCEDURE:  OPEN REDUCTION INTERNAL FIXATION (ORIF) ULNAR FRACTURE  SURGEON:  Renette Butters, MD  ASSISTANT: Aggie Moats, PA-C, he was present and scrubbed throughout the case, critical for completion in a timely fashion, and for retraction, instrumentation, and closure.   ANESTHESIA:   mac/block  PREOPERATIVE INDICATIONS:  Kerry Young is a  55 y.o. female with a diagnosis of left ulna fracture who failed conservative measures and elected for surgical management.    The risks benefits and alternatives were discussed with the patient preoperatively including but not limited to the risks of infection, bleeding, nerve injury, cardiopulmonary complications, the need for revision surgery, among others, and the patient was willing to proceed.  OPERATIVE IMPLANTS: stryker plate  OPERATIVE FINDINGS: unstable fx  BLOOD LOSS: min  COMPLICATIONS: none  TOURNIQUET TIME: 69mn  OPERATIVE PROCEDURE:  Patient was identified in the preoperative holding area and site was marked by me She was transported to the operating theater and placed on the table in supine position taking care to pad all bony prominences. After a preincinduction time out anesthesia was induced. The left upper extremity was prepped and draped in normal sterile fashion and a pre-incision timeout was performed. She received ancef for preoperative antibiotics.   Made a longitudinal incision directly over the fracture of the ulna and sharply dissected down to bone.  I elevated periosteum off of the fracture site and freed soft tissue from within the fracture site  I then reduced the fracture and held this in place well I placed a appropriate titanium plate.  I had solid screws with bicortical fixation on both sides of the fracture and was happy with the stability.  I took multiple x-rays  of the forearm 3+ which confirmed appropriate reduction and hardware placement  I thoroughly irrigated the incision I closed this in layers and a sterile dressing was placed followed by splint.  POST OPERATIVE PLAN: dvt px per primary. Ok to wbat through L elbow

## 2021-02-18 NOTE — Anesthesia Postprocedure Evaluation (Signed)
Anesthesia Post Note  Patient: Kerry Young  Procedure(s) Performed: OPEN REDUCTION INTERNAL FIXATION (ORIF) ULNAR FRACTURE (Left: Arm Lower)     Patient location during evaluation: PACU Anesthesia Type: Regional and MAC Level of consciousness: awake and alert Pain management: pain level controlled Vital Signs Assessment: post-procedure vital signs reviewed and stable Respiratory status: spontaneous breathing, nonlabored ventilation, respiratory function stable and patient connected to nasal cannula oxygen Cardiovascular status: stable and blood pressure returned to baseline Postop Assessment: no apparent nausea or vomiting Anesthetic complications: no   No notable events documented.  Last Vitals:  Vitals:   02/18/21 1406 02/18/21 1421  BP: 139/84 (!) 161/82  Pulse: 98 79  Resp: 18 20  Temp: 36.4 C 36.8 C  SpO2: 95% 95%    Last Pain:  Vitals:   02/18/21 1421  TempSrc: Oral  PainSc: 3                  [name]

## 2021-02-18 NOTE — Progress Notes (Signed)
   02/18/21 0725  Clinical Encounter Type  Visited With Patient  Visit Type Spiritual support;Pre-op  Referral From Nurse  Consult/Referral To Chaplain   Chaplain responded. The nurse said the patient is requesting prayer before surgery.  The patient said she has anxiety around her surgery and feels alone. I conveyed a calming presence as we explored her spirituality in trusting God. I shared words of hope and inspiration. She responded conveying her desired outcome of regaining mental wholeness and physical strength. Our visit ended with a prayer.  This note was prepared by Deneen Harts, M.Div..  For questions please contact by phone [phone].

## 2021-02-18 NOTE — Interval H&P Note (Signed)
History and Physical Interval Note:  02/18/2021 7:33 AM  Kerry Young  has presented today for surgery, with the diagnosis of left ulna fracture.  The various methods of treatment have been discussed with the patient and family. After consideration of risks, benefits and other options for treatment, the patient has consented to  Procedure(s): OPEN REDUCTION INTERNAL FIXATION (ORIF) ULNAR FRACTURE (Left) as a surgical intervention.  The patient's history has been reviewed, patient examined, no change in status, stable for surgery.  I have reviewed the patient's chart and labs.  Questions were answered to the patient's satisfaction.     Sheral Apley
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
## 2021-02-18 NOTE — Transfer of Care (Signed)
Immediate Anesthesia Transfer of Care Note  Patient: Kerry Young  Procedure(s) Performed: OPEN REDUCTION INTERNAL FIXATION (ORIF) ULNAR FRACTURE (Left: Arm Lower)  Patient Location: PACU  Anesthesia Type:MAC and Regional  Level of Consciousness: drowsy  Airway & Oxygen Therapy: Patient Spontanous Breathing and Patient connected to face mask oxygen  Post-op Assessment: Report given to RN and Post -op Vital signs reviewed and stable  Post vital signs: Reviewed and stable  Last Vitals:  Vitals Value Taken Time  BP 100/58 02/18/21 1336  Temp    Pulse 89 02/18/21 1337  Resp 15 02/18/21 1337  SpO2 98 % 02/18/21 1337  Vitals shown include unvalidated device data.  Last Pain:  Vitals:   02/18/21 1106  TempSrc:   PainSc: 10-Worst pain ever      Patients Stated Pain Goal: 2 (43/15/40 0867)  Complications: No notable events documented.

## 2021-02-19 LAB — CBC
HCT: 27.6 % — ABNORMAL LOW (ref 36.0–46.0)
Hemoglobin: 8.8 g/dL — ABNORMAL LOW (ref 12.0–15.0)
MCH: 29.4 pg (ref 26.0–34.0)
MCHC: 31.9 g/dL (ref 30.0–36.0)
MCV: 92.3 fL (ref 80.0–100.0)
Platelets: 506 10*3/uL — ABNORMAL HIGH (ref 150–400)
RBC: 2.99 MIL/uL — ABNORMAL LOW (ref 3.87–5.11)
RDW: 14.4 % (ref 11.5–15.5)
WBC: 10.6 10*3/uL — ABNORMAL HIGH (ref 4.0–10.5)
nRBC: 0 % (ref 0.0–0.2)

## 2021-02-19 MED ORDER — ACETAMINOPHEN 500 MG PO TABS
1000.0000 mg | ORAL_TABLET | Freq: Four times a day (QID) | ORAL | Status: DC
  Administered 2021-02-19 – 2021-02-22 (×11): 1000 mg via ORAL
  Filled 2021-02-19 (×11): qty 2

## 2021-02-19 MED ORDER — OXYCODONE HCL 5 MG PO TABS
10.0000 mg | ORAL_TABLET | ORAL | Status: DC | PRN
  Administered 2021-02-19 – 2021-02-20 (×6): 15 mg via ORAL
  Administered 2021-02-20: 10 mg via ORAL
  Administered 2021-02-20: 15 mg via ORAL
  Administered 2021-02-21: 5 mg via ORAL
  Administered 2021-02-21 (×3): 15 mg via ORAL
  Administered 2021-02-21: 10 mg via ORAL
  Administered 2021-02-21 (×2): 15 mg via ORAL
  Administered 2021-02-22: 10 mg via ORAL
  Administered 2021-02-22 (×3): 15 mg via ORAL
  Administered 2021-02-22: 5 mg via ORAL
  Filled 2021-02-19: qty 3
  Filled 2021-02-19: qty 2
  Filled 2021-02-19 (×2): qty 3
  Filled 2021-02-19 (×2): qty 2
  Filled 2021-02-19 (×13): qty 3
  Filled 2021-02-19: qty 2
  Filled 2021-02-19: qty 3

## 2021-02-19 MED ORDER — OXYCODONE HCL 5 MG/5ML PO SOLN: 10.0000 mg | ORAL | Status: DC | PRN

## 2021-02-19 MED ORDER — OXYCODONE HCL 5 MG/5ML PO SOLN
10.0000 mg | ORAL | Status: DC | PRN
  Administered 2021-02-19: 15 mg via ORAL
  Filled 2021-02-19: qty 15

## 2021-02-19 MED ORDER — OXYCODONE HCL 5 MG PO TABS: 5.0000 mg | ORAL_TABLET | ORAL | Status: DC | PRN

## 2021-02-19 MED ORDER — SODIUM CHLORIDE 0.9 % IV SOLN: Freq: Once | INTRAVENOUS | Status: AC

## 2021-02-19 MED ORDER — IBUPROFEN 600 MG PO TABS
600.0000 mg | ORAL_TABLET | Freq: Four times a day (QID) | ORAL | Status: DC
  Administered 2021-02-19 – 2021-02-22 (×14): 600 mg via ORAL
  Filled 2021-02-19 (×14): qty 1

## 2021-02-19 MED ORDER — SODIUM CHLORIDE 0.9 % IV SOLN: INTRAVENOUS | Status: DC

## 2021-02-19 MED ORDER — HYDROMORPHONE HCL 1 MG/ML IJ SOLN
0.2500 mg | Freq: Three times a day (TID) | INTRAMUSCULAR | Status: DC | PRN
  Administered 2021-02-19 – 2021-02-21 (×5): 0.25 mg via INTRAVENOUS
  Filled 2021-02-19 (×6): qty 0.5

## 2021-02-19 MED ORDER — POLYETHYLENE GLYCOL 3350 17 G PO PACK
17.0000 g | PACK | Freq: Two times a day (BID) | ORAL | Status: DC
  Administered 2021-02-20 – 2021-02-22 (×2): 17 g via ORAL
  Filled 2021-02-19 (×7): qty 1

## 2021-02-19 NOTE — TOC Progression Note (Signed)
Transition of Care [facility]) - Progression Note    Patient Details  Name: Kerry Young MRN: [id number] Date of Birth: 09-09-1965  Transition of Care [facility]) CM/SW Contact  Jimmy Picket, [place] Phone Number: 02/19/2021, 12:23 PM  Clinical Narrative:     CSW received call for additional clinicals from [facility]. CSW sent clinicals in hub.   Expected Discharge Plan: Skilled Nursing Facility Barriers to Discharge: Continued Medical Work up  Expected Discharge Plan and Services Expected Discharge Plan: Skilled Nursing Facility   Discharge Planning Services: CM Consult   Living arrangements for the past 2 months: Single Family Home                                       Social Determinants of Health (SDOH) Interventions    Readmission Risk Interventions No flowsheet data found.  Jimmy Picket, Theresia Majors, [place] Clinical Social Worker [phone]

## 2021-02-19 NOTE — Progress Notes (Signed)
   02/19/21 2045  Vitals  Temp 97.6 F (36.4 C)  Temp Source Oral  BP (!) 82/49 (. Ulla Potash paged. no new orders received and this time. Accdg to MD, give 500NS Bolusx1 if patient's SBP is 70's. will continue to monitor patient.)  MAP (mmHg) (!) 59  BP Location Left Leg  BP Method Automatic  Patient Position (if appropriate) Lying  Pulse Rate 68  Resp 16  MEWS COLOR  MEWS Score Color Green  Oxygen Therapy  SpO2 98 %  O2 Device Nasal Cannula  MEWS Score  MEWS Temp 0  MEWS Systolic 1  MEWS Pulse 0  MEWS RR 0  MEWS LOC 0  MEWS Score 1
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
## 2021-02-20 LAB — CBC
HCT: 29.4 % — ABNORMAL LOW (ref 36.0–46.0)
Hemoglobin: 9.4 g/dL — ABNORMAL LOW (ref 12.0–15.0)
MCH: 29.2 pg (ref 26.0–34.0)
MCHC: 32 g/dL (ref 30.0–36.0)
MCV: 91.3 fL (ref 80.0–100.0)
Platelets: 514 10*3/uL — ABNORMAL HIGH (ref 150–400)
RBC: 3.22 MIL/uL — ABNORMAL LOW (ref 3.87–5.11)
RDW: 14.7 % (ref 11.5–15.5)
WBC: 9.1 10*3/uL (ref 4.0–10.5)
nRBC: 0 % (ref 0.0–0.2)

## 2021-02-20 LAB — BASIC METABOLIC PANEL
Anion gap: 10 (ref 5–15)
BUN: 16 mg/dL (ref 6–20)
CO2: 21 mmol/L — ABNORMAL LOW (ref 22–32)
Calcium: 8.4 mg/dL — ABNORMAL LOW (ref 8.9–10.3)
Chloride: 107 mmol/L (ref 98–111)
Creatinine, Ser: 0.7 mg/dL (ref 0.44–1.00)
GFR, Estimated: >60 mL/min (ref >60)
Glucose, Bld: 93 mg/dL (ref 70–99)
Potassium: 4.1 mmol/L (ref 3.5–5.1)
Sodium: 138 mmol/L (ref 135–145)

## 2021-02-20 MED ORDER — SORBITOL 70 % SOLN
960.0000 mL | TOPICAL_OIL | Freq: Once | ORAL | Status: AC
  Administered 2021-02-20: 960 mL via RECTAL
  Filled 2021-02-20: qty 473

## 2021-02-20 MED ORDER — BISACODYL 10 MG RE SUPP
10.0000 mg | Freq: Once | RECTAL | Status: AC
  Administered 2021-02-20: 10 mg via RECTAL
  Filled 2021-02-20: qty 1

## 2021-02-20 MED ORDER — MAGNESIUM HYDROXIDE 400 MG/5ML PO SUSP: 960.0000 mL | Freq: Once | ORAL | Status: DC

## 2021-02-20 NOTE — Progress Notes (Signed)
Subjective: Still having pain all over. But does not appear uncomfortable in the bed. Discussed continued working with therapy and the importance of this.    Objective:  PE: VITALS:   Vitals:   02/20/21 0034 02/20/21 0237 02/20/21 0625 02/20/21 0900  BP: (!) 86/60 105/60 131/70 105/65  Pulse: 67 66 69 82  Resp: 18 17 18 18   Temp: 98.1 F (36.7 C) 98.1 F (36.7 C) (!) 97.1 F (36.2 C) 98.5 F (36.9 C)  TempSrc: Oral Oral Oral Oral  SpO2: 100% 100% 99% 99%  Weight:      Height:       General: sitting up in hospital bed, NAD RUE: incisions CDI - steri-strips in place, tender to palpation throughout, she endorses axillary nerve sensation, endorses distal sensation, + Motor in  AIN, PIN, Ulnar distributions. Well perfused digits. 2+ radial pulse  LUE: Splint CDI. Skin intact though cannot assess fully beneath splint. Nontender to palpation proximally,  + Motor in  AIN, PIN, Ulnar distributions. Sensation intact in medial, radial, and ulnar distributions. Well perfused digits.   RLE - incisions CDI, steri-strips in place tender to palpation knee to ankle, ROM from 0-45 degrees, compartments soft and compressible, no pain with passive stretch, swelling of right ankle and foot - this has decreased, Able to flex and extend all toes, dorsiflexion and plantarflexion intact, endorses distal sensation, mepilex on heel covering blood blister, + DP pulse, warm well perfused digits  LABS  Results for orders placed or performed during the hospital encounter of 02/04/21 (from the past 24 hour(s))  CBC     Status: Abnormal   Collection Time: 02/20/21  9:19 AM  Result Value Ref Range   WBC 9.1 4.0 - 10.5 K/uL   RBC 3.22 (L) 3.87 - 5.11 MIL/uL   Hemoglobin 9.4 (L) 12.0 - 15.0 g/dL   HCT 04/22/21 (L) 01.0 - 27.2 %   MCV 91.3 80.0 - 100.0 fL   MCH 29.2 26.0 - 34.0 pg   MCHC 32.0 30.0 - 36.0 g/dL   RDW 53.6 64.4 - 03.4 %   Platelets 514 (H) 150 - 400 K/uL   nRBC 0.0 0.0 - 0.2 %  Basic  metabolic panel     Status: Abnormal   Collection Time: 02/20/21  9:19 AM  Result Value Ref Range   Sodium 138 135 - 145 mmol/L   Potassium 4.1 3.5 - 5.1 mmol/L   Chloride 107 98 - 111 mmol/L   CO2 21 (L) 22 - 32 mmol/L   Glucose, Bld 93 70 - 99 mg/dL   BUN 16 6 - 20 mg/dL   Creatinine, Ser 04/22/21 0.44 - 1.00 mg/dL   Calcium 8.4 (L) 8.9 - 10.3 mg/dL   GFR, Estimated 5.95 >63 mL/min   Anion gap 10 5 - 15    No results found.  Assessment/Plan: Right tibia fracture - 7/20 - Right tibia IM Nail by Dr. 8/20 - POD #15  - NWB RLE - okay to leave incisions open to air, keep clean and dry  Right proximal humerus fracture - 7/22 - Right proximal humerus ORIF with arthroscopic rotator cuff repair with Dr. 8/22 - POD#13 - NWB RUE, sling at all times - discussed the importance of sling usage, especially when patient is out of the bed - discussed the change of non-union/malunion with the patient and how not wearing the sling increases that risk - okay to leave incisions open to air, keep clean and dry  Left ulna fracture - 8/2 - ORIF left ulnar with Dr. Eulah Pont - POD#2 - Splint, NWB left wrist, WBAT through left elbow - Okay to use left hand for ADLs  Pain control and VTE prophylaxis - per trauma. I discussed the importance of weaning off dilaudid as she is able for her transition to SNF.  Continue PT/OT Follow up in 3 weeks in office  Dispo: PT/OT recommending SNF. New evals today after surgery. TOC following. Insurance authorization remains pending for SNF bed at [facility]. Okay for discharge to SNF from orthopedic standpoint once cleared by trauma team and therapies.   Kerry Young Clemmie Buelna 02/20/2021, 12:08 PM
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
## 2021-02-20 NOTE — Progress Notes (Signed)
   02/20/21 0900  Assess: MEWS Score  Temp 98.5 F (36.9 C)  BP 105/65  Pulse Rate 82  Resp 18  SpO2 99 %  O2 Device Nasal Cannula  Assess: MEWS Score  MEWS Temp 0  MEWS Systolic 0  MEWS Pulse 0  MEWS RR 0  MEWS LOC 0  MEWS Score 0  MEWS Score Color Green  Assess: if the MEWS score is Yellow or Red  Were vital signs taken at a resting state? Yes  Focused Assessment No change from prior assessment  Early Detection of Sepsis Score *See Row Information* Low  Treat  MEWS Interventions Other (Comment) (pt GREEN MEWS at end of initial YELLOW MEWS interventions)  Document  Patient Outcome Stabilized after interventions  Progress note created (see row info) Yes

## 2021-02-20 NOTE — Progress Notes (Signed)
   02/19/21 2228  Assess: MEWS Score  Temp 97.9 F (36.6 C)  BP (!) 76/47 (patient asking for PRN Oxy. rechecked BP first.)  Pulse Rate 61  Resp 16  SpO2 100 %  O2 Device Nasal Cannula  Assess: MEWS Score  MEWS Temp 0  MEWS Systolic 2  MEWS Pulse 0  MEWS RR 0  MEWS LOC 0  MEWS Score 2  MEWS Score Color Yellow  Assess: if the MEWS score is Yellow or Red  Were vital signs taken at a resting state? Yes  Focused Assessment Change from prior assessment (see assessment flowsheet)  Early Detection of Sepsis Score *See Row Information* Low  MEWS guidelines implemented *See Row Information* Yes  Treat  MEWS Interventions Escalated (See documentation below)  Take Vital Signs  Increase Vital Sign Frequency  Yellow: Q 2hr X 2 then Q 4hr X 2, if remains yellow, continue Q 4hrs  Escalate  MEWS: Escalate Yellow: discuss with charge nurse/RN and consider discussing with provider and RRT  Notify: Charge Nurse/RN  Name of Charge Nurse/RN Notified Celso,RN  Date Charge Nurse/RN Notified 02/19/21  Time Charge Nurse/RN Notified 2229  Notify: Provider  Provider Name/Title Moishe Spice  Date Provider Notified 02/19/21  Time Provider Notified 2231  Notification Type Page  Notification Reason Change in status  Provider response Other (Comment);See new orders (previous verbal order when MD was called earlier.)  Date of Provider Response 02/19/21  Time of Provider Response 2245  Document  Patient Outcome Not stable and remains on department  Progress note created (see row info) Yes    Patient is asking for PRN Oxy for 9/10 generalized pain. Prior to given pain med, writer rechecked BP because patient's BP earlier was 82/49 and PRN Oxy might lower BP further. Repeat BP at 2228 was 76/47. Darnelle Spangle paged. CBC ordered and administered [id number]. Repeat BP after bolus is 81/52. M. Dwain Sarna, MD was again paged. New order for NS@75cc /hr and states its ok to given PRN Oxy. PRN pain med  was then administered.  Call bell within reach and will continue to close monitor.

## 2021-02-20 NOTE — Progress Notes (Signed)
Patient c/o severe constipation & is asking for an enema. Esmond Harps, MD made aware and new order received for Smog enema. Medication administered but ineffective. Patient stated "I can't push it out". MD made aware and called back. No new orders received at this time. Offered prune juice and scheduled miralax given.  Call bell within reach and will continue to monitor.

## 2021-02-21 ENCOUNTER — Encounter (HOSPITAL_COMMUNITY): Payer: Self-pay | Admitting: Orthopedic Surgery

## 2021-02-21 LAB — CBC
HCT: 39.1 % (ref 36.0–46.0)
Hemoglobin: 11.9 g/dL — ABNORMAL LOW (ref 12.0–15.0)
MCH: 29 pg (ref 26.0–34.0)
MCHC: 30.4 g/dL (ref 30.0–36.0)
MCV: 95.1 fL (ref 80.0–100.0)
Platelets: 454 10*3/uL — ABNORMAL HIGH (ref 150–400)
RBC: 4.11 MIL/uL (ref 3.87–5.11)
RDW: 15 % (ref 11.5–15.5)
WBC: 8.9 10*3/uL (ref 4.0–10.5)
nRBC: 0 % (ref 0.0–0.2)

## 2021-02-21 LAB — RESP PANEL BY RT-PCR (FLU A&B, COVID) ARPGX2
Influenza A by PCR: NEGATIVE
Influenza B by PCR: NEGATIVE
SARS Coronavirus 2 by RT PCR: NEGATIVE

## 2021-02-21 MED ORDER — POLYETHYLENE GLYCOL 3350 17 G PO PACK: 17.0000 g | PACK | Freq: Two times a day (BID) | ORAL | 0 refills | Status: DC | PRN

## 2021-02-21 MED ORDER — TRAMADOL HCL 50 MG PO TABS: 50.0000 mg | ORAL_TABLET | Freq: Four times a day (QID) | ORAL | 0 refills | Status: DC | PRN

## 2021-02-21 MED ORDER — OXYCODONE HCL 10 MG PO TABS: 10.0000 mg | ORAL_TABLET | ORAL | 0 refills | Status: AC | PRN

## 2021-02-21 MED ORDER — DOCUSATE SODIUM 100 MG PO CAPS: 100.0000 mg | ORAL_CAPSULE | Freq: Two times a day (BID) | ORAL | 0 refills | Status: DC | PRN

## 2021-02-21 NOTE — Progress Notes (Addendum)
Attempted report to [place], was sent straight to voicemail for the nurses station and the Interior and spatial designer of Nursing. Left a message with DON to call CN [phone].  [facility] [phone] Room 121
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
## 2021-02-21 NOTE — Progress Notes (Signed)
Placed three calls to [place] unable to reach staff.

## 2021-02-21 NOTE — TOC Transition Note (Addendum)
Transition of Care [facility]) - CM/SW Discharge Note   Patient Details  Name: Kerry Young MRN: [id number] Date of Birth: 1965-12-03  [facility]) CM/SW Contact:  Glennon Mac, RN Phone Number: 02/21/2021, 2:23 PM   Clinical Narrative:   Pt medically stable for discharge today, and insurance authorization received for SNF admission.  Patient will need STAT Covid test prior to transport to SNF.  PA notified for discharge summary and Covid testing order.  Will send discharge summary to facility when available.  Pt will go to room 121 at facility; bedside nurse notified to call report to [phone].    Will call for transport once COVID results are back.    Addendum:  02/21/21 3:16pm COVID test results negative.  PTAR called for transport to [facility].    Final next level of care: Skilled Nursing Facility Barriers to Discharge: Barriers Resolved   Patient Goals and CMS Choice Patient states their goals for this hospitalization and ongoing recovery are:: to get better CMS Medicare.gov Compare Post Acute Care list provided to:: Patient Choice offered to / list presented to : Patient  Discharge Placement PASRR number recieved: 02/10/21            Patient chooses bed at: Other - please specify in the comment section below: [facility]) Patient to be transferred to facility by: PTAR   Patient and family notified of of transfer: 02/21/21  Discharge Plan and Services   Discharge Planning Services: CM Consult Post Acute Care Choice: Skilled Nursing Facility                                   Readmission Risk Interventions Readmission Risk Prevention Plan 02/21/2021  Post Dischage Appt Not Complete  Appt Comments DC to SNF  Medication Screening Complete  Transportation Screening Complete   Quintella Baton, RN, BSN  Trauma/Neuro ICU Case Manager [phone]
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
## 2021-02-22 MED ORDER — ACETAMINOPHEN 325 MG PO TABS
650.0000 mg | ORAL_TABLET | Freq: Once | ORAL | Status: DC
  Filled 2021-02-22: qty 2

## 2021-02-22 NOTE — Progress Notes (Signed)
TRN to room to possibly assist with D/C complications per TMD request.  On arrival pt sleeping soundly supine on stretcher.  Per primary nurse pt refused to transfer to [facility] stretcher d/t inability to lay flat on stretcher, PTAR no longer at bedside on my arrival.  I spoke with CM regarding WC transport. No WC transport is available on the weekends. Per TMD needs to be d/c either by PTAR or she can have a family member transport her to [place] where she has been accepted.  Will have day shift TRN further assist with getting the patient d/c to accepting facility.   [place], [place] [phone]

## 2021-02-22 NOTE — Progress Notes (Signed)
Pt complaining of 10/10 pain. BP 87/48. Pt requesting oxycodone for pain control. 5mg  oxycodone and 650 mg tylenol to be admin per , MD. See new orders. Will continue to monitor.

## 2021-02-22 NOTE — Social Work (Signed)
CSW confirmed with [facility] that pt still good to DC to them today. PTAR called.  Jimmy Picket, Theresia Majors, [place] Clinical Social Worker [phone]

## 2021-02-22 NOTE — Progress Notes (Signed)
Charge RN to bedside to speak with pt regarding d/c plan. Pt agreeable to d/c to SNF via PTAR. Bedside nurse and social worker made aware. Awaiting PTAR for transportation.

## 2021-02-22 NOTE — Progress Notes (Signed)
TRN to room to discuss discharge- pt refusing to go to SNF via PTAR- states "I cannot lay flat on stretcher-Ayesha said that rehab was planning it"  Pt was sitting up in the bed, comfortable and in no acute distress- I explained that she would be discharged today.  Discussed with TMD and T-APP - orders to be discharged, with security escort if needed. Discussed with Baxter Hire, RN - Consulting civil engineer on [employer].   Anell Barr, RN Trauma Response Nurse [phone]

## 2021-02-23 ENCOUNTER — Emergency Department (HOSPITAL_COMMUNITY)
Admission: EM | Admit: 2021-02-23 | Discharge: 2021-02-23 | Disposition: A | Discharge status: Home / Self Care | Payer: Medicaid Other | Attending: Emergency Medicine | Admitting: Emergency Medicine

## 2021-02-23 ENCOUNTER — Encounter (HOSPITAL_COMMUNITY): Payer: Self-pay

## 2021-02-23 ENCOUNTER — Other Ambulatory Visit: Payer: Self-pay

## 2021-02-23 DIAGNOSIS — G8918 Other acute postprocedural pain: Secondary | ICD-10-CM | POA: Diagnosis present

## 2021-02-23 DIAGNOSIS — Z87891 Personal history of nicotine dependence: Secondary | ICD-10-CM | POA: Diagnosis not present

## 2021-02-23 DIAGNOSIS — F419 Anxiety disorder, unspecified: Secondary | ICD-10-CM

## 2021-02-23 MED ORDER — TIZANIDINE HCL 4 MG PO TABS
4.0000 mg | ORAL_TABLET | Freq: Once | ORAL | Status: AC
  Administered 2021-02-23: 4 mg via ORAL
  Filled 2021-02-23: qty 1

## 2021-02-23 MED ORDER — ALPRAZOLAM 0.5 MG PO TABS
1.0000 mg | ORAL_TABLET | Freq: Once | ORAL | Status: AC
  Administered 2021-02-23: 1 mg via ORAL
  Filled 2021-02-23: qty 2

## 2021-02-23 MED ORDER — PREGABALIN 50 MG PO CAPS
300.0000 mg | ORAL_CAPSULE | Freq: Once | ORAL | Status: AC
  Administered 2021-02-23: 300 mg via ORAL
  Filled 2021-02-23: qty 6

## 2021-02-23 MED ORDER — HYDROMORPHONE HCL 1 MG/ML IJ SOLN
1.0000 mg | Freq: Once | INTRAMUSCULAR | Status: AC
Start: 2021-02-23 — End: 2021-02-23
  Administered 2021-02-23: 1 mg via INTRAMUSCULAR
  Filled 2021-02-23: qty 1

## 2021-02-23 MED ORDER — OXYCODONE HCL 5 MG PO TABS
10.0000 mg | ORAL_TABLET | Freq: Once | ORAL | Status: AC
  Administered 2021-02-23: 10 mg via ORAL
  Filled 2021-02-23: qty 2
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
## 2021-02-23 NOTE — ED Notes (Signed)
Patient's brief found to be very soiled and patient reporting pain with current lying position. Patient was changed, new brief placed, repositioned, and fresh linen placed on bed. Patient now resting comfortably.

## 2021-02-23 NOTE — ED Notes (Signed)
Patient had one small soft bowel movement. Patient cleaned, peri care performed, and fresh linens placed.

## 2021-02-23 NOTE — ED Notes (Signed)
PTAR called per provider request
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
## 2021-02-23 NOTE — ED Notes (Signed)
Attempted to call report to [facility]. x4 attempts made, staff at facility are not answering the phone.

## 2021-02-24 ENCOUNTER — Other Ambulatory Visit: Payer: Self-pay

## 2021-02-24 ENCOUNTER — Encounter (HOSPITAL_COMMUNITY): Payer: Self-pay | Admitting: Emergency Medicine

## 2021-02-24 ENCOUNTER — Emergency Department (HOSPITAL_COMMUNITY)
Admission: EM | Admit: 2021-02-24 | Discharge: 2021-02-24 | Disposition: A | Discharge status: Home / Self Care | Payer: Medicaid Other | Attending: Emergency Medicine | Admitting: Emergency Medicine

## 2021-02-24 DIAGNOSIS — G8918 Other acute postprocedural pain: Secondary | ICD-10-CM | POA: Insufficient documentation

## 2021-02-24 DIAGNOSIS — Z87891 Personal history of nicotine dependence: Secondary | ICD-10-CM | POA: Diagnosis not present

## 2021-02-24 MED ORDER — ALPRAZOLAM 1 MG PO TABS: 1.0000 mg | ORAL_TABLET | Freq: Three times a day (TID) | ORAL | 0 refills | Status: DC | PRN

## 2021-02-24 MED ORDER — ALPRAZOLAM 0.5 MG PO TABS
1.0000 mg | ORAL_TABLET | Freq: Once | ORAL | Status: AC
  Administered 2021-02-24: 1 mg via ORAL
  Filled 2021-02-24: qty 2

## 2021-02-24 MED ORDER — HYDROCODONE-ACETAMINOPHEN 5-325 MG PO TABS: 2.0000 | ORAL_TABLET | Freq: Four times a day (QID) | ORAL | 0 refills | Status: DC | PRN

## 2021-02-24 MED ORDER — HYDROMORPHONE HCL 1 MG/ML IJ SOLN
1.0000 mg | Freq: Once | INTRAMUSCULAR | Status: AC
  Administered 2021-02-24: 1 mg via INTRAMUSCULAR

## 2021-02-24 MED ORDER — HYDROCODONE-ACETAMINOPHEN 5-325 MG PO TABS
2.0000 | ORAL_TABLET | Freq: Once | ORAL | Status: AC
  Administered 2021-02-24: 2 via ORAL
  Filled 2021-02-24: qty 2

## 2021-02-24 MED ORDER — HYDROMORPHONE HCL 1 MG/ML IJ SOLN
1.0000 mg | Freq: Once | INTRAMUSCULAR | Status: DC
  Filled 2021-02-24: qty 1

## 2021-02-24 MED ORDER — PREGABALIN 50 MG PO CAPS
300.0000 mg | ORAL_CAPSULE | Freq: Once | ORAL | Status: AC
  Administered 2021-02-24: 300 mg via ORAL
  Filled 2021-02-24: qty 6

## 2021-02-24 MED ORDER — HYDROCODONE-ACETAMINOPHEN 5-325 MG PO TABS: 2.0000 | ORAL_TABLET | ORAL | 0 refills | Status: DC | PRN

## 2021-02-24 NOTE — ED Notes (Signed)
PTAR notified and scheduled for pick up to go home
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
## 2021-02-24 NOTE — ED Notes (Signed)
Discharge instructions provided to the patient and verbalizes understanding

## 2021-02-25 ENCOUNTER — Other Ambulatory Visit: Payer: Self-pay | Admitting: Physician Assistant

## 2021-02-25 ENCOUNTER — Encounter (HOSPITAL_COMMUNITY): Payer: Self-pay | Admitting: Emergency Medicine

## 2021-02-25 ENCOUNTER — Other Ambulatory Visit: Payer: Self-pay

## 2021-02-25 MED ORDER — BUSPIRONE HCL 15 MG PO TABS: 15.0000 mg | ORAL_TABLET | Freq: Three times a day (TID) | ORAL | 5 refills | Status: DC

## 2021-02-25 NOTE — Telephone Encounter (Signed)
I tried to send buspar but it shows its pended by you

## 2021-02-25 NOTE — Telephone Encounter (Signed)
Yes, I sent it just now.  I got kicked out of epic before I could hit the send button.Kerry KitchenMarland KitchenThanks

## 2021-02-25 NOTE — Telephone Encounter (Signed)
Last filled 7.15.22

## 2021-02-25 NOTE — Telephone Encounter (Signed)
Pt would also like a refill on Buspar 15mg .Please send to [place] on [address].

## 2021-02-27 ENCOUNTER — Ambulatory Visit: Payer: Medicaid Other | Admitting: Physician Assistant

## 2021-03-17 ENCOUNTER — Telehealth (INDEPENDENT_AMBULATORY_CARE_PROVIDER_SITE_OTHER): Payer: Medicaid Other | Admitting: Physician Assistant

## 2021-03-17 ENCOUNTER — Encounter: Payer: Self-pay | Admitting: Physician Assistant

## 2021-03-17 DIAGNOSIS — F411 Generalized anxiety disorder: Secondary | ICD-10-CM | POA: Diagnosis not present

## 2021-03-17 DIAGNOSIS — G4700 Insomnia, unspecified: Secondary | ICD-10-CM

## 2021-03-17 DIAGNOSIS — F4323 Adjustment disorder with mixed anxiety and depressed mood: Secondary | ICD-10-CM | POA: Diagnosis not present

## 2021-03-17 DIAGNOSIS — F4310 Post-traumatic stress disorder, unspecified: Secondary | ICD-10-CM | POA: Insufficient documentation

## 2021-03-17 DIAGNOSIS — T07XXXA Unspecified multiple injuries, initial encounter: Secondary | ICD-10-CM

## 2021-03-17 MED ORDER — ALPRAZOLAM 1 MG PO TABS: 1.0000 mg | ORAL_TABLET | Freq: Three times a day (TID) | ORAL | 2 refills | Status: DC | PRN

## 2021-03-17 MED ORDER — QUETIAPINE FUMARATE 50 MG PO TABS: 50.0000 mg | ORAL_TABLET | Freq: Every day | ORAL | 2 refills | Status: DC
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
## 2021-03-25 ENCOUNTER — Other Ambulatory Visit: Payer: Self-pay | Admitting: Physician Assistant

## 2021-03-27 ENCOUNTER — Telehealth: Payer: Self-pay

## 2021-03-27 ENCOUNTER — Telehealth: Payer: Self-pay | Admitting: Physician Assistant

## 2021-03-27 NOTE — Telephone Encounter (Signed)
Prior Authorization submitted and approved for QUETIAPINE 50 MG #60 effective 03/24/2021-03/24/2022, PA# [id number]. With [employer] [id number] M

## 2021-03-27 NOTE — Telephone Encounter (Signed)
Pharmacy called and advised that patient had originally tried to fill Alprazolam 1mg  script earlier than allowed. Somehow, the pharmacy inadvertently cancelled the script and can't reactivate it because it's a controlled substance.  They  need it resent.   [facility] [address], [address] - [address]  [address] [place]  Phone:  [phone]  Fax:  [phone]

## 2021-03-28 ENCOUNTER — Other Ambulatory Visit: Payer: Self-pay

## 2021-03-28 MED ORDER — ALPRAZOLAM 1 MG PO TABS: 1.0000 mg | ORAL_TABLET | Freq: Three times a day (TID) | ORAL | 0 refills | Status: DC | PRN

## 2021-03-28 NOTE — Telephone Encounter (Signed)
Thank you, I sent it

## 2021-03-28 NOTE — Telephone Encounter (Signed)
Pended please review message

## 2021-04-16 ENCOUNTER — Ambulatory Visit: Payer: Medicaid Other | Admitting: Physical Therapy

## 2021-04-25 ENCOUNTER — Ambulatory Visit: Payer: Medicaid Other | Admitting: Physical Therapy

## 2021-04-25 ENCOUNTER — Ambulatory Visit: Payer: Medicaid Other | Attending: Orthopaedic Surgery

## 2021-04-25 ENCOUNTER — Other Ambulatory Visit: Payer: Self-pay

## 2021-04-25 DIAGNOSIS — M25661 Stiffness of right knee, not elsewhere classified: Secondary | ICD-10-CM

## 2021-04-25 DIAGNOSIS — R2689 Other abnormalities of gait and mobility: Secondary | ICD-10-CM

## 2021-04-25 DIAGNOSIS — Z9889 Other specified postprocedural states: Secondary | ICD-10-CM

## 2021-04-25 DIAGNOSIS — M25611 Stiffness of right shoulder, not elsewhere classified: Secondary | ICD-10-CM | POA: Diagnosis present

## 2021-04-25 DIAGNOSIS — S82201E Unspecified fracture of shaft of right tibia, subsequent encounter for open fracture type I or II with routine healing: Secondary | ICD-10-CM | POA: Diagnosis not present

## 2021-04-25 DIAGNOSIS — S52202S Unspecified fracture of shaft of left ulna, sequela: Secondary | ICD-10-CM | POA: Diagnosis present

## 2021-04-25 DIAGNOSIS — S42201D Unspecified fracture of upper end of right humerus, subsequent encounter for fracture with routine healing: Secondary | ICD-10-CM

## 2021-04-25 DIAGNOSIS — G478 Other sleep disorders: Secondary | ICD-10-CM

## 2021-04-25 DIAGNOSIS — M6281 Muscle weakness (generalized): Secondary | ICD-10-CM
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
## 2021-05-02 ENCOUNTER — Ambulatory Visit: Payer: Medicaid Other | Admitting: Physician Assistant

## 2021-05-07 ENCOUNTER — Ambulatory Visit: Payer: Medicaid Other

## 2021-05-13 ENCOUNTER — Telehealth: Payer: Self-pay | Admitting: Physical Therapy

## 2021-05-13 ENCOUNTER — Ambulatory Visit: Payer: Medicaid Other | Admitting: Physical Therapy

## 2021-05-13 NOTE — Telephone Encounter (Signed)
Left voicemail regarding no-show to appointment today. Left reminder of attendance policy and next appointment date and time.

## 2021-05-16 ENCOUNTER — Ambulatory Visit: Payer: Medicaid Other

## 2021-05-27 ENCOUNTER — Ambulatory Visit: Payer: Medicaid Other

## 2021-06-04 ENCOUNTER — Encounter: Payer: Self-pay | Admitting: Physician Assistant

## 2021-06-04 ENCOUNTER — Ambulatory Visit (INDEPENDENT_AMBULATORY_CARE_PROVIDER_SITE_OTHER): Payer: Medicaid Other | Admitting: Physician Assistant

## 2021-06-04 DIAGNOSIS — F4323 Adjustment disorder with mixed anxiety and depressed mood: Secondary | ICD-10-CM

## 2021-06-04 DIAGNOSIS — F4310 Post-traumatic stress disorder, unspecified: Secondary | ICD-10-CM | POA: Diagnosis not present

## 2021-06-04 DIAGNOSIS — T07XXXA Unspecified multiple injuries, initial encounter: Secondary | ICD-10-CM

## 2021-06-04 DIAGNOSIS — G4700 Insomnia, unspecified: Secondary | ICD-10-CM | POA: Diagnosis not present

## 2021-06-04 MED ORDER — ALPRAZOLAM 1 MG PO TABS: 1.0000 mg | ORAL_TABLET | Freq: Three times a day (TID) | ORAL | 5 refills | Status: DC | PRN

## 2021-06-04 MED ORDER — QUETIAPINE FUMARATE ER 150 MG PO TB24: 150.0000 mg | ORAL_TABLET | Freq: Every day | ORAL | 0 refills | Status: DC
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
## 2021-06-16 ENCOUNTER — Other Ambulatory Visit: Payer: Self-pay | Admitting: Physician Assistant

## 2021-06-16 MED ORDER — ALPRAZOLAM 1 MG PO TABS: 1.0000 mg | ORAL_TABLET | Freq: Three times a day (TID) | ORAL | 5 refills | Status: DC | PRN

## 2021-06-16 NOTE — Telephone Encounter (Signed)
Thank you for being proactive!

## 2021-06-16 NOTE — Telephone Encounter (Signed)
Kerry Young called to request that all her prescriptions be transferred to the [facility] on [address].  The [facility] has been awful in getting her prescriptions filled.  She has not picked up prescriptions sent in on 11/16. But gain needs all 3 prescriptions transferred.

## 2021-06-24 ENCOUNTER — Telehealth: Payer: Self-pay | Admitting: Physician Assistant

## 2021-06-24 NOTE — Telephone Encounter (Signed)
Kerry Young called to report that the pharmacy is waiting for a PA for her seroquel.  Apparently when [name] increased her dose to 150mg  it required a new PA.  Please check on the status of the new PA.

## 2021-06-24 NOTE — Telephone Encounter (Signed)
Noted-working on this 

## 2021-06-24 NOTE — Telephone Encounter (Signed)
Please review

## 2021-06-24 NOTE — Telephone Encounter (Signed)
Prior Approval received for QUETIAPINE ER 150 MG effective 06/24/2021-06/24/2022 with [employer] PA# [id number]

## 2021-06-29 ENCOUNTER — Emergency Department (HOSPITAL_COMMUNITY): Payer: Medicaid Other

## 2021-06-29 ENCOUNTER — Encounter (HOSPITAL_COMMUNITY): Payer: Self-pay

## 2021-06-29 ENCOUNTER — Emergency Department (HOSPITAL_COMMUNITY)
Admission: EM | Admit: 2021-06-29 | Discharge: 2021-06-29 | Disposition: A | Discharge status: Home / Self Care | Payer: Medicaid Other | Attending: Emergency Medicine | Admitting: Emergency Medicine

## 2021-06-29 DIAGNOSIS — S4991XA Unspecified injury of right shoulder and upper arm, initial encounter: Secondary | ICD-10-CM | POA: Insufficient documentation

## 2021-06-29 DIAGNOSIS — S8991XA Unspecified injury of right lower leg, initial encounter: Secondary | ICD-10-CM | POA: Diagnosis not present

## 2021-06-29 DIAGNOSIS — S59912A Unspecified injury of left forearm, initial encounter: Secondary | ICD-10-CM | POA: Diagnosis not present

## 2021-06-29 DIAGNOSIS — W19XXXA Unspecified fall, initial encounter: Secondary | ICD-10-CM | POA: Insufficient documentation

## 2021-06-29 DIAGNOSIS — F17210 Nicotine dependence, cigarettes, uncomplicated: Secondary | ICD-10-CM | POA: Diagnosis not present

## 2021-06-29 IMAGING — CR DG CHEST 1V
1 series · 1 of 1 positions shown · non-contrast
Comparison: [DATE]

CLINICAL DATA: Fall

EXAM:
CHEST  1 VIEW

[x chest ap]
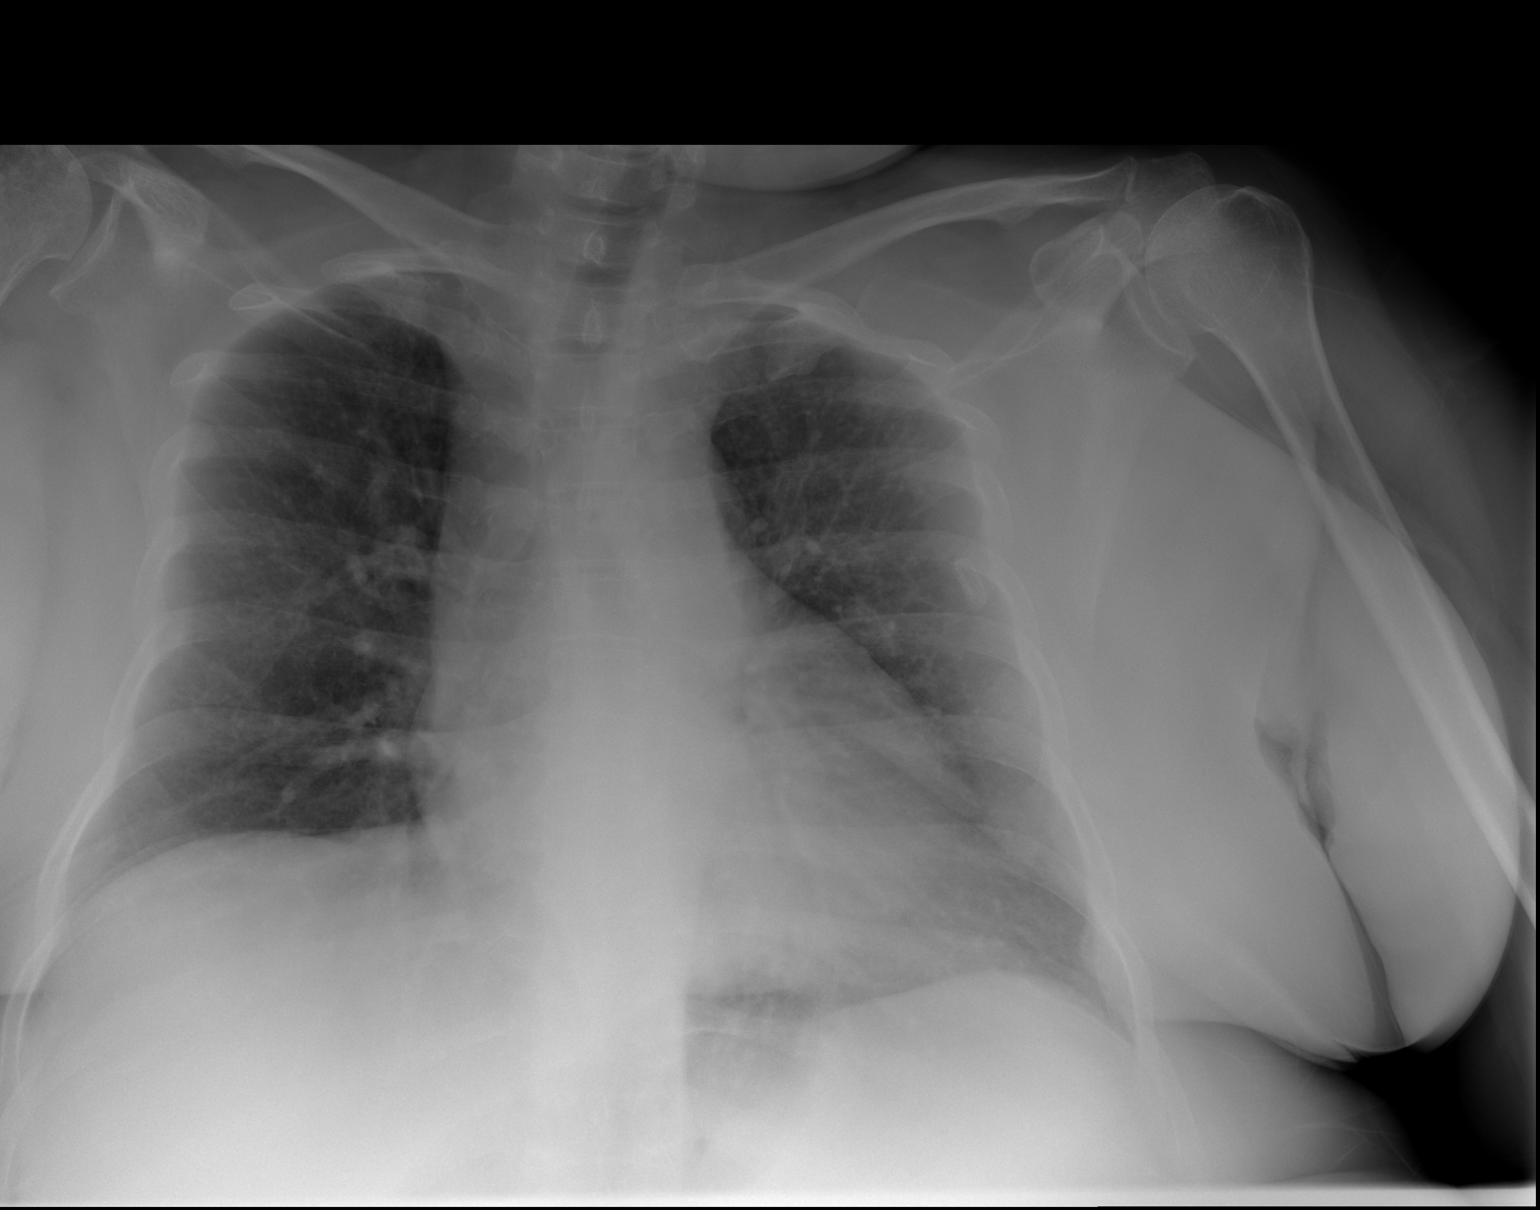

[1 of 1 positions shown; findings below may reference images not displayed]

FINDINGS: Heart and mediastinal contours are within normal limits. No focal
opacities or effusions. No acute bony abnormality.
IMPRESSION: No active disease.
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
## 2021-06-29 IMAGING — CR DG KNEE COMPLETE 4+V*R*
4 series · 4 of 4 positions shown · non-contrast
Comparison: None.

CLINICAL DATA: Fall

EXAM:
RIGHT KNEE - COMPLETE 4+ VIEW

[x knee ap right (1 of 4)]
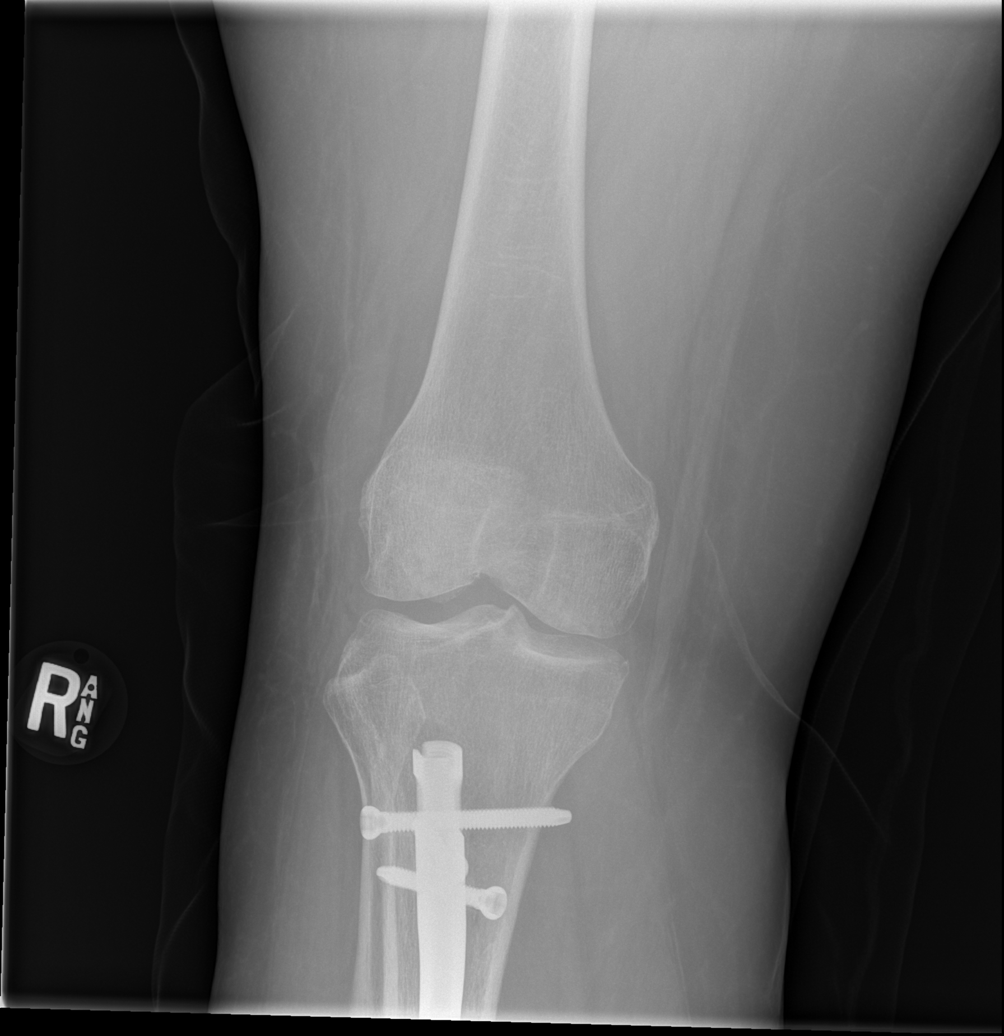

[x knee ap right (2 of 4)]
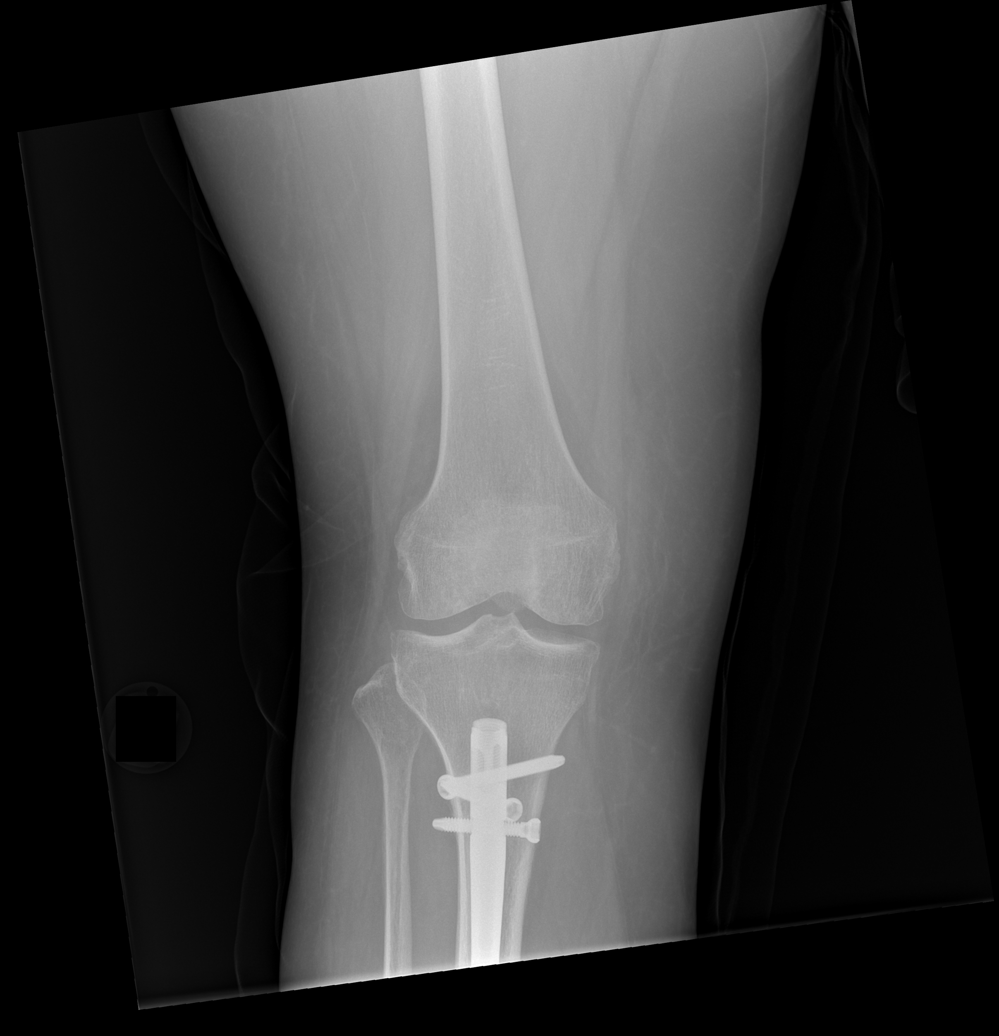

[x knee ap right (3 of 4)]
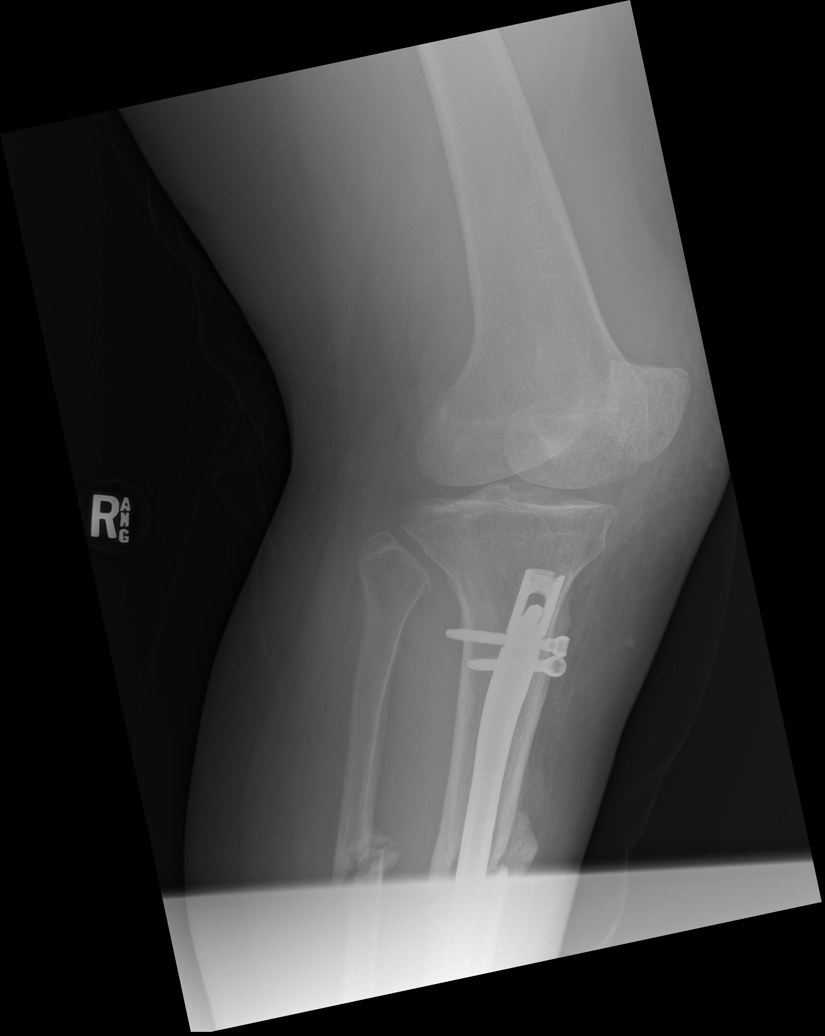

[x knee ap right (4 of 4)]
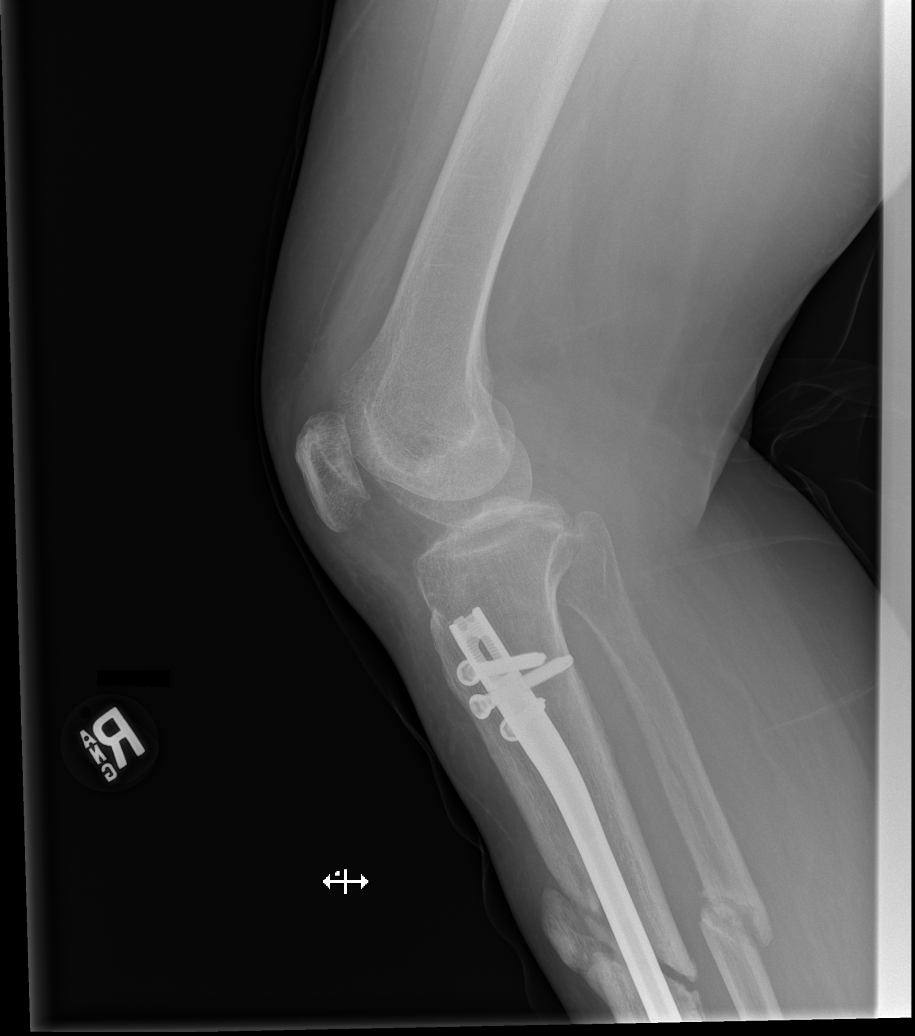

[4 of 4 positions shown; findings below may reference images not displayed]

FINDINGS: Intramedullary nail across a mid right tibial fracture. Adjacent
right fibular fracture noted. Nonunion across both fractures. No
acute fracture, subluxation or dislocation. No joint effusion.
IMPRESSION: Old mid right tibial and fibular fractures with nonunion.

No acute bony abnormality.
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
## 2021-06-29 IMAGING — CR DG SHOULDER 2+V*R*
3 series · 3 of 3 positions shown · non-contrast
Comparison: [DATE]

CLINICAL DATA: Fall

EXAM:
RIGHT SHOULDER - 2+ VIEW

[x shoulder ap right (1 of 3)]
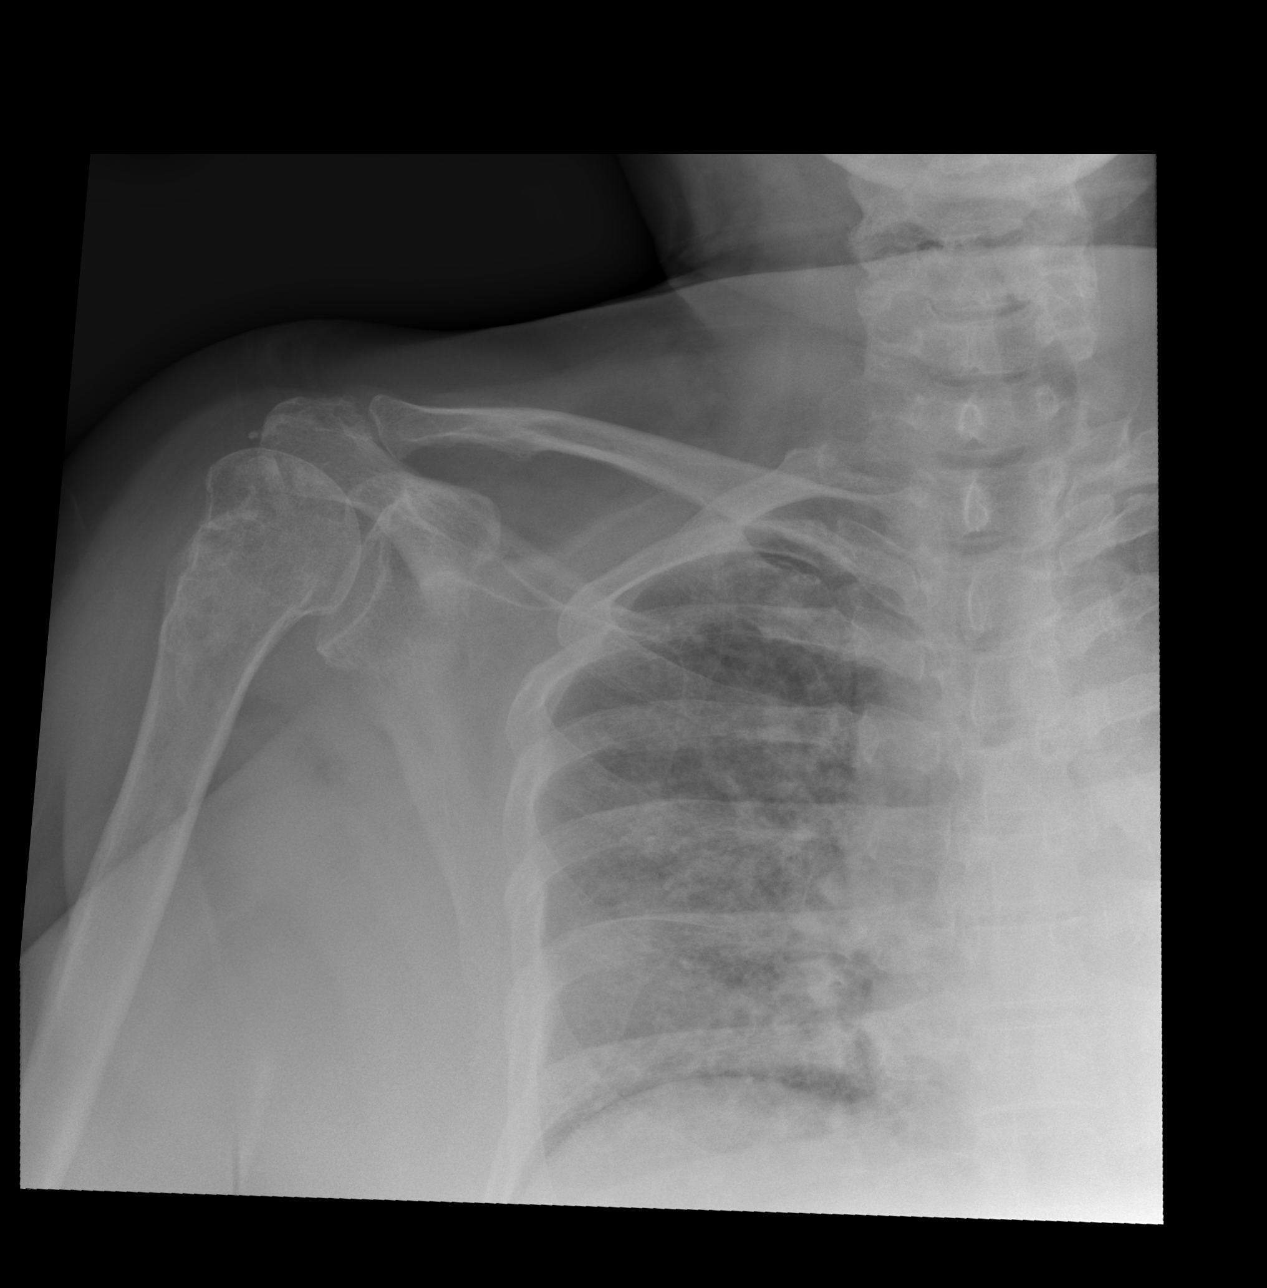

[x shoulder ap right (2 of 3)]
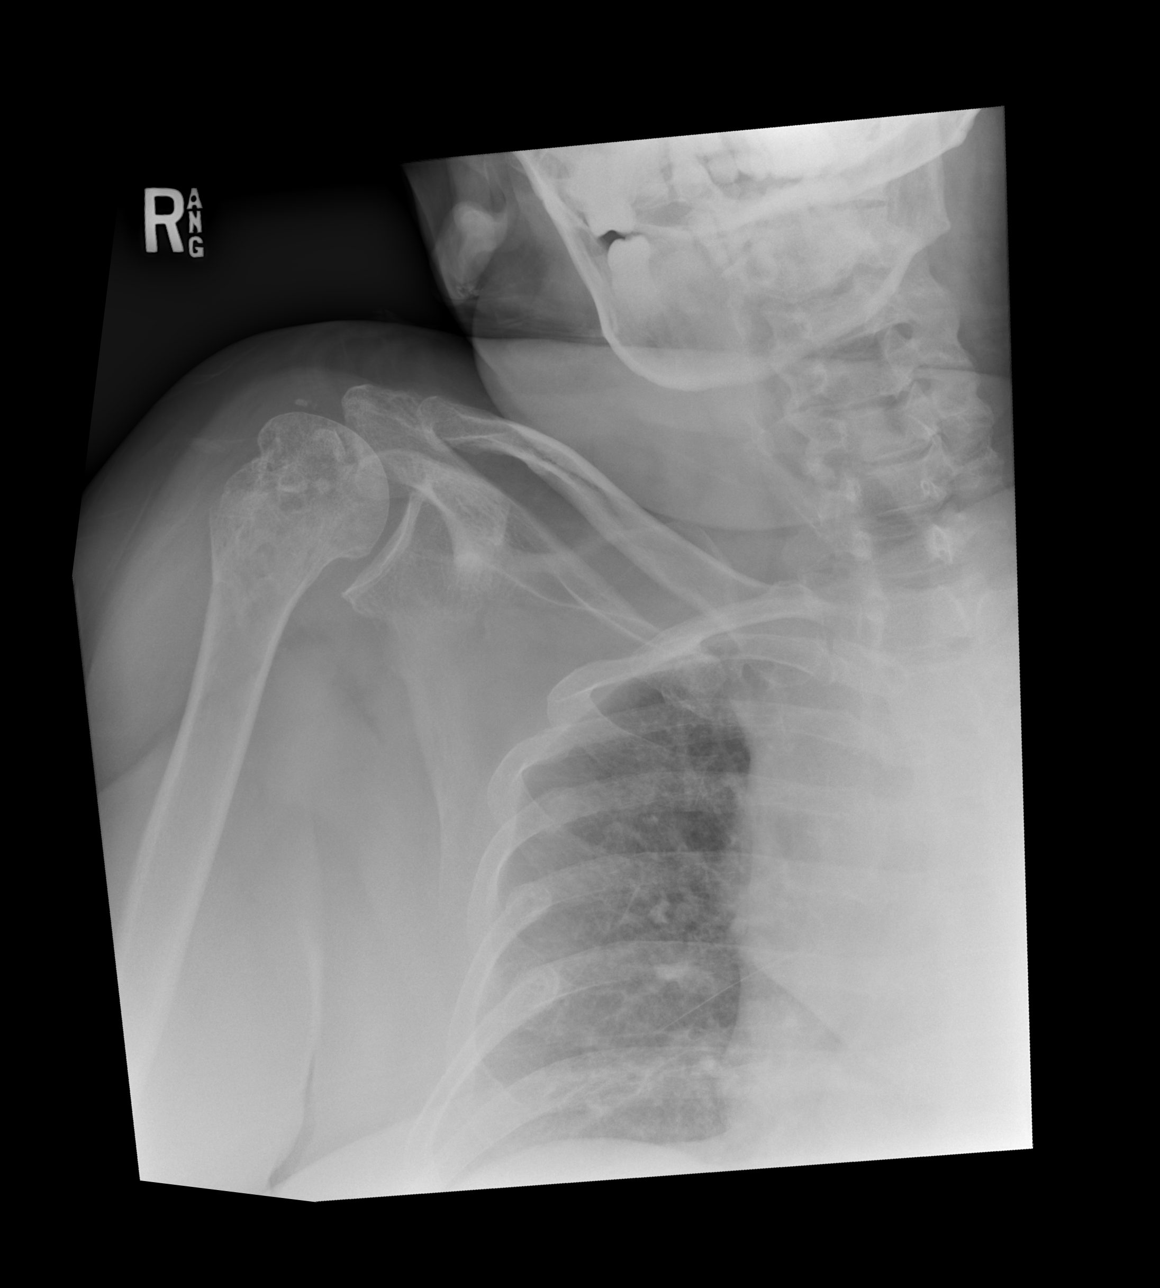

[x shoulder ap right (3 of 3)]
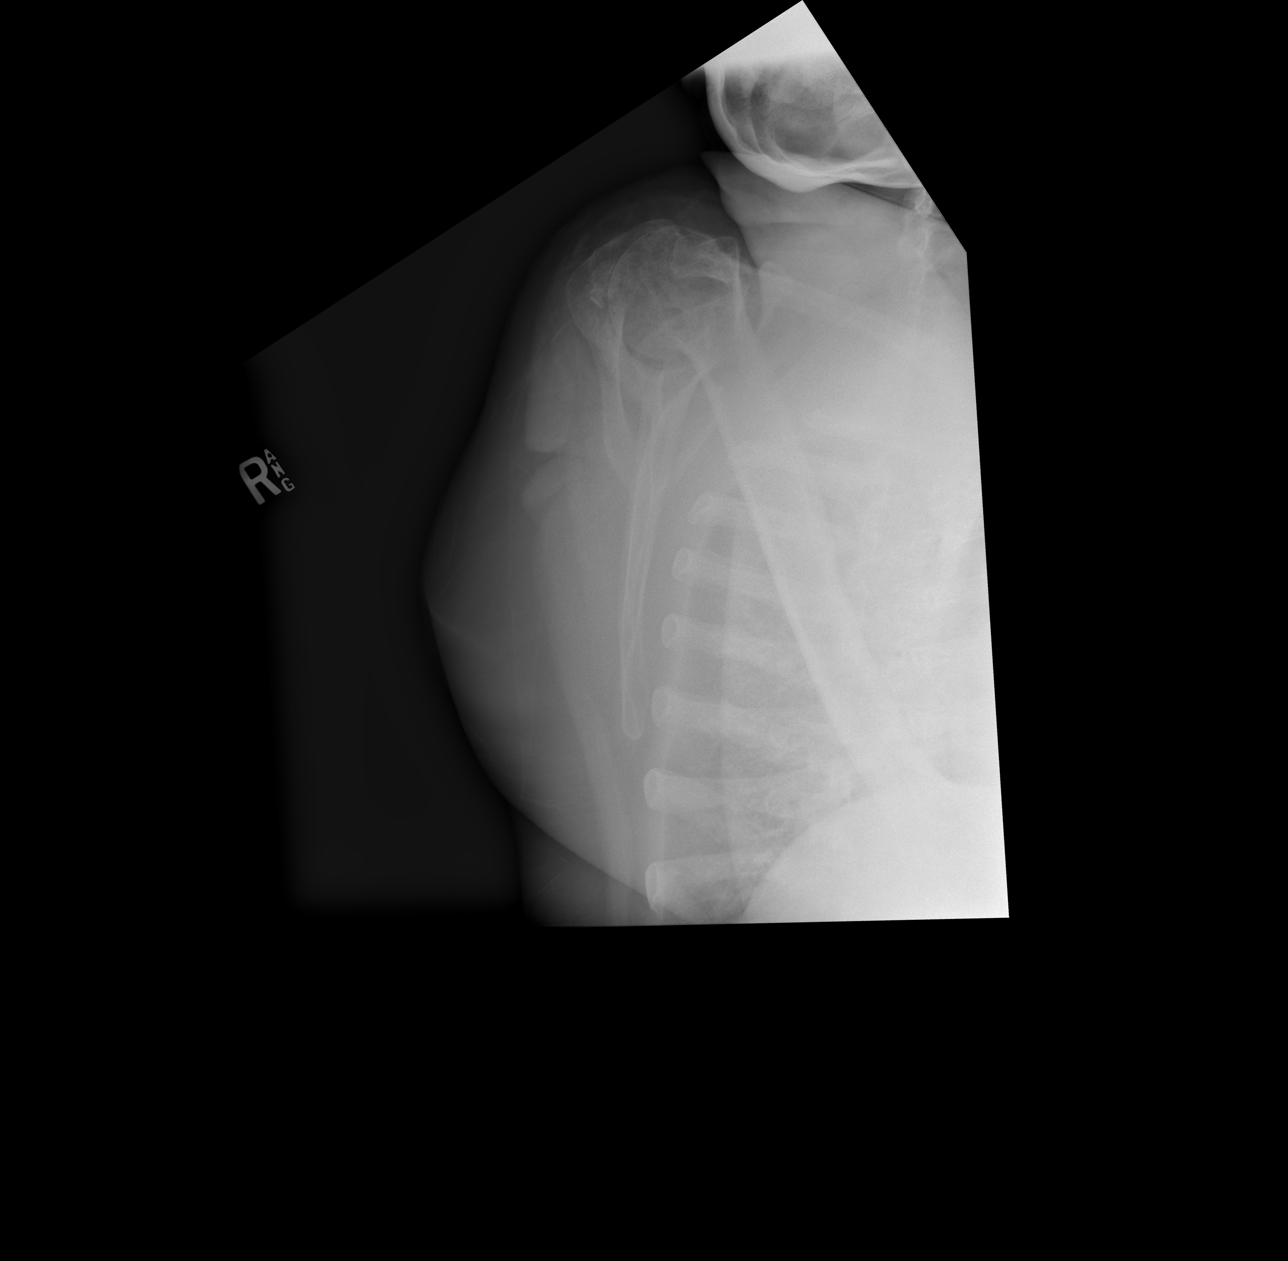

[3 of 3 positions shown; findings below may reference images not displayed]

FINDINGS: Changes related to old greater tuberosity fracture. No acute
fracture, subluxation or dislocation. Degenerative changes in the
right AC joint.
IMPRESSION: No acute bony abnormality.

## 2021-06-29 MED ORDER — HYDROCODONE-ACETAMINOPHEN 7.5-325 MG PO TABS
1.0000 | ORAL_TABLET | Freq: Once | ORAL | Status: AC
  Administered 2021-06-29: 1 via ORAL
  Filled 2021-06-29: qty 1

## 2021-06-29 MED ORDER — LORAZEPAM 1 MG PO TABS
1.0000 mg | ORAL_TABLET | Freq: Once | ORAL | Status: AC
  Administered 2021-06-29: 1 mg via ORAL
  Filled 2021-06-29: qty 1

## 2021-06-29 NOTE — ED Notes (Signed)
Cervical collar placed on patient per MD order

## 2021-06-29 NOTE — Discharge Instructions (Signed)
Return for any problem.  ?

## 2021-06-29 NOTE — ED Notes (Signed)
Patient being transported to MRI
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
## 2021-06-29 NOTE — ED Provider Notes (Signed)
[facility]-EMERGENCY DEPT Provider Note   CSN: [id number] Arrival date & time: 06/29/21  1352     History No chief complaint on file.   Kerry Young is a 55 y.o. female.  55 year old female with prior medical history as detailed below presents for evaluation.  Patient reports that she had a fall yesterday evening.  She uses a walker to ambulate around her house.  Her fall occurred around 11 PM.  Her boyfriend, whom she lives with, was able to help her get into the bed.  This morning she awoke and went to the bathroom with a walker.  She is able to use the bathroom but then had so much pain that she had to lower herself to the floor.  She is able to crawl on the floor from her bathroom back to her bedroom.  At this point she called EMS for assistance.  EMS found her on the floor of her bedroom naked.  She complains of pain to the right shoulder, left forearm, right knee.  She reports that she did hit her head last night around 11.  She does not believe that she passed out.  Her boyfriend was gone for work this morning and was unable to help her -- thus she called EMS.  She denies associated chest pain, shortness of breath, nausea, vomiting, fever.  She reports routine daily use of narcotics.  She has not taken any of of her home pain medications today.  The history is provided by the patient.  Fall This is a new problem. The current episode started 6 to 12 hours ago. The problem occurs rarely. The problem has not changed since onset.Pertinent negatives include no chest pain and no abdominal pain. Nothing aggravates the symptoms. Nothing relieves the symptoms.      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/08/2020   Insomnia 07/08/2020    Past Surgical History:  Procedure Laterality Date   COLPOSCOPY N/A 10/26/2019   Procedure: COLPOSCOPY with cervical biopsy;  Surgeon: Myna Hidalgo, DO;  Location: [facility];  Service: Gynecology;  Laterality: N/A;   ORIF HUMERUS FRACTURE Right 02/07/2021   Procedure: Arthroscopic repair rotator cuff and fixation of PROXIMAL HUMERUS FRACTURE;  Surgeon: Bjorn Pippin, MD;  Location: [facility] OR;  Service: Orthopedics;  Laterality: Right;   ORIF ULNAR FRACTURE Left 02/18/2021   Procedure: OPEN REDUCTION INTERNAL FIXATION (ORIF) ULNAR FRACTURE;  Surgeon: Sheral Apley, MD;  Location: [facility] OR;  Service: Orthopedics;  Laterality: Left;   TIBIA IM NAIL INSERTION Right 02/05/2021   Procedure: INTRAMEDULLARY (IM) NAIL TIBIAL;  Surgeon: Bjorn Pippin, MD;  Location: [facility] OR;  Service: Orthopedics;  Laterality: Right;   WISDOM TOOTH EXTRACTION       OB History   No obstetric history on file.     Family History  Problem Relation Age of Onset   Anxiety disorder Mother    Hypertension Mother    Kidney failure Mother    Heart attack Paternal Grandfather    Healthy Daughter     Social History   Tobacco  Use   Smoking status: Every Day    Packs/day: 0.15    Types: Cigarettes    Last attempt to quit: 07/11/2018    Years since quitting: 2.9   Smokeless tobacco: Never  Vaping Use   Vaping Use: Never used  Substance Use Topics   Alcohol use: Not Currently    Comment: 1 per year maybe.   Drug use: No    Home Medications Prior to Admission medications   Medication Sig Start Date End Date Taking? Authorizing Provider  ALPRAZolam Prudy Feeler) 1 MG tablet Take 1 tablet (1 mg total) by mouth 3 (three) times daily as needed. for anxiety 06/16/21   Melony Overly T, PA-C  atorvastatin (LIPITOR) 20 MG tablet Take 1 tablet by mouth once daily Patient not taking:  No sig reported 09/18/19   [provider]  bisacodyl (DULCOLAX) 10 MG suppository Place 1 suppository (10 mg total) rectally daily as needed for moderate constipation. 02/14/21   Juliet Rude, PA-C  busPIRone (BUSPAR) 15 MG tablet Take 1 tablet (15 mg total) by mouth 3 (three) times daily. 02/25/21   Cherie Ouch, PA-C  clopidogrel (PLAVIX) 75 MG tablet Take 1 tablet (75 mg total) by mouth daily. Patient not taking: Reported on 06/04/2021 02/15/21   Juliet Rude, PA-C  diphenhydrAMINE (BENADRYL) 25 MG tablet Take 25 mg by mouth 3 (three) times daily as needed for allergies.    [provider]  docusate sodium (COLACE) 100 MG capsule Take 1 capsule (100 mg total) by mouth 2 (two) times daily as needed for mild constipation. 02/21/21   Maczis, Elmer Sow, PA-C  HYDROcodone-acetaminophen (NORCO/VICODIN) 5-325 MG tablet Take 2 tablets by mouth every 6 (six) hours as needed. 02/02/20   Carroll Sage, PA-C  HYDROcodone-acetaminophen (NORCO/VICODIN) 5-325 MG tablet Take 2 tablets by mouth every 4 (four) hours as needed. 02/24/21   Mancel Bale, MD  ibuprofen (ADVIL,MOTRIN) 200 MG tablet Take 600 mg by mouth every 8 (eight) hours as needed for headache.    [provider]  lidocaine (LIDODERM) 5 % Place 2 patches onto the skin daily. Remove & Discard patch within 12 hours or as directed by MD Patient not taking: No sig reported 02/15/21   Juliet Rude, PA-C  methocarbamol (ROBAXIN) 500 MG tablet Take 2 tablets (1,000 mg total) by mouth 4 (four) times daily. Patient not taking: No sig reported 02/14/21   Juliet Rude, PA-C  montelukast (SINGULAIR) 10 MG tablet Take 10 mg by mouth daily. Patient not taking: Reported on 06/04/2021 10/05/19   [provider]  montelukast (SINGULAIR) 10 MG tablet Take 10 mg by mouth daily. Patient not taking: Reported on 06/04/2021 12/11/20   [provider]  pantoprazole (PROTONIX) 40 MG tablet Take 1 tablet (40 mg  total) by mouth daily. Patient not taking: No sig reported 02/15/21   Juliet Rude, PA-C  polyethylene glycol (MIRALAX / GLYCOLAX) 17 g packet Take 17 g by mouth daily. 02/15/21   Juliet Rude, PA-C  pregabalin (LYRICA) 300 MG capsule  10/02/19   [provider]  pregabalin (LYRICA) 300 MG capsule Take 300 mg by mouth 2 (two) times daily. 01/25/21   [provider]  QUEtiapine Fumarate (SEROQUEL XR) 150 MG 24 hr tablet Take 1 tablet (150 mg total) by mouth at bedtime. 06/04/21   Melony Overly T, PA-C  tiZANidine (ZANAFLEX) 4 MG tablet Take by mouth. 07/07/19   [provider]  tiZANidine (ZANAFLEX) 4 MG tablet  Take 4 mg by mouth 3 (three) times daily. 01/15/21   [provider]  traMADol (ULTRAM) 50 MG tablet Take 1 tablet (50 mg total) by mouth every 6 (six) hours as needed for severe pain. Patient not taking: No sig reported 02/21/21   Maczis, Elmer Sow, PA-C  UBRELVY 100 MG TABS Take 1 tablet by mouth daily as needed for migraine. 11/18/20   [provider]    Allergies    Pregabalin, Celexa [citalopram], Naproxen, Citalopram, Naproxen, Ondansetron hcl, Other, Oxycodone-acetaminophen, Percocet [oxycodone-acetaminophen], and Zofran [ondansetron]  Review of Systems   Review of Systems  Cardiovascular:  Negative for chest pain.  Gastrointestinal:  Negative for abdominal pain.  All other systems reviewed and are negative.  Physical Exam Updated Vital Signs There were no vitals taken for this visit.  Physical Exam Vitals and nursing note reviewed.  Constitutional:      General: She is not in acute distress.    Appearance: Normal appearance. She is well-developed.  HENT:     Head: Normocephalic and atraumatic.  Eyes:     Conjunctiva/sclera: Conjunctivae normal.     Pupils: Pupils are equal, round, and reactive to light.  Cardiovascular:     Rate and Rhythm: Normal rate and regular rhythm.     Heart sounds: Normal heart sounds.  Pulmonary:      Effort: Pulmonary effort is normal. No respiratory distress.     Breath sounds: Normal breath sounds.  Abdominal:     General: There is no distension.     Palpations: Abdomen is soft.     Tenderness: There is no abdominal tenderness.  Musculoskeletal:        General: Tenderness present. No deformity. Normal range of motion.     Cervical back: Normal range of motion and neck supple.     Comments: Diffuse tenderness noted with palpation of the right shoulder, left forearm, and right knee.  Patient is poorly compliant with exam.  She is complaining of extreme pain to all of these areas.  Distal right arm, left arm, and both lower extremities are neurovascularly intact.  Skin:    General: Skin is warm and dry.  Neurological:     General: No focal deficit present.     Mental Status: She is alert and oriented to person, place, and time.    ED Results / Procedures / Treatments   Labs (all labs ordered are listed, but only abnormal results are displayed) Labs Reviewed - No data to display  EKG None  Radiology No results found.  Procedures Procedures   Medications Ordered in ED Medications  HYDROcodone-acetaminophen (NORCO) 7.5-325 MG per tablet 1 tablet (has no administration in time range)    ED Course  I have reviewed the triage vital signs and the nursing notes.  Pertinent labs & imaging results that were available during my care of the patient were reviewed by me and considered in my medical decision making (see chart for details).  Clinical Course as of 06/29/21 1737  Sun Jun 29, 2021  1506 CT Head Wo Contrast [PM]    Clinical Course User Index [PM] Wynetta Fines, MD   MDM Rules/Calculators/A&P                           MDM  MSE complete  Kerry Young was evaluated in Emergency Department on 06/29/2021 for the symptoms described in the history of present illness. She was evaluated in the context  of the global COVID-19 pandemic, which necessitated  consideration that the patient might be at risk for infection with the SARS-CoV-2 virus that causes COVID-19. Institutional protocols and algorithms that pertain to the evaluation of patients at risk for COVID-19 are in a state of rapid change based on information released by regulatory bodies including the CDC and federal and state organizations. These policies and algorithms were followed during the patient's care in the ED.  Patient is presenting after reported fall from standing while at home.  Imaging obtained is without significant acute abnormality.  Patient did have initial CT of the C-spine that was concerning for possible ligamentous injury.  MRI obtained is without evidence of same.  Patient with minimal neck pain on repeat evaluation.  Patient does feel improved after ED evaluation.  She is requesting a splint for her left wrist which is still sore.  Imaging of the left forearm was specifically without acute abnormality.  Patient is reporting that her boyfriend is available for pickup and transport back home.  She understands need for close follow-up.  Strict return precautions given and understood.    Final Clinical Impression(s) / ED Diagnoses Final diagnoses:  Fall, initial encounter    Rx / DC Orders ED Discharge Orders     None        Wynetta Fines, MD 06/29/21 [phone]

## 2021-06-30 ENCOUNTER — Other Ambulatory Visit: Payer: Self-pay | Admitting: Psychiatry

## 2021-06-30 ENCOUNTER — Telehealth: Payer: Self-pay | Admitting: Physician Assistant

## 2021-06-30 MED ORDER — ALPRAZOLAM 1 MG PO TABS: 1.0000 mg | ORAL_TABLET | Freq: Three times a day (TID) | ORAL | 0 refills | Status: DC | PRN

## 2021-06-30 NOTE — Telephone Encounter (Signed)
Last filled 12/5 please review

## 2021-06-30 NOTE — Telephone Encounter (Signed)
Please review and please send if you respond after 5:30

## 2021-06-30 NOTE — Telephone Encounter (Signed)
Reviewed PDMP.  Patient picked up #90 Xanax 1 mg tablets a week ago and says she has lost the bottle.  She has been picking up #90 every 30 days along with hydrocodone and Lyrica.  We will give the patient 2 days worth of Xanax and discuss it with her provider.

## 2021-06-30 NOTE — Telephone Encounter (Signed)
Pt called and stated she fell and had to go to the hospital.  She had her Xanax in her pocketbook but now she doesn't have it.  She is asking for a refill to be called in to [address]  Next appt 1/11

## 2021-06-30 NOTE — Telephone Encounter (Signed)
Pt called back teary.  Pls prioritize this request.

## 2021-06-30 NOTE — Progress Notes (Signed)
Reviewed PDMP.  Patient picked up #90 Xanax 1 mg tablets a week ago and says she has lost the bottle.  She has been picking up #90 every 30 days along with hydrocodone and Lyrica.  We will give the patient 2 days worth of Xanax and discuss it with her provider.

## 2021-07-01 ENCOUNTER — Other Ambulatory Visit: Payer: Self-pay | Admitting: Physician Assistant

## 2021-07-01 MED ORDER — ALPRAZOLAM 1 MG PO TABS: 1.0000 mg | ORAL_TABLET | Freq: Three times a day (TID) | ORAL | 0 refills | Status: DC | PRN

## 2021-07-01 NOTE — Telephone Encounter (Signed)
Juleen called to check status of her Xanax refill.  I told 2 days worth was sent in and that [name] will have to evaluate any further refills.  Please let her know if she will be able to get anymore.  She will up the 6 tablets.

## 2021-07-01 NOTE — Telephone Encounter (Signed)
Please review

## 2021-07-01 NOTE — Telephone Encounter (Signed)
LVM with info

## 2021-07-01 NOTE — Telephone Encounter (Signed)
Let her know I sent in 70 pills, that plus the 6 that Dr. Jennelle Human sent in yesterday will get her to her next refill which cannot be before 07/23/2021.  Let her know I rarely prescribe early refills but in this case I will.  She needs to take care of any controlled substances, treat them as if they are gold, and it is unlikely I will ever approve early refills in the future.

## 2021-07-24 ENCOUNTER — Other Ambulatory Visit: Payer: Self-pay | Admitting: Physician Assistant

## 2021-07-24 NOTE — Telephone Encounter (Signed)
Pt requesting New Rx for Seroquel to [facility]. Also, Xanax new Rx to [facility].  Will not be using [facility] any longer. Apt 1/11

## 2021-07-30 ENCOUNTER — Encounter: Payer: Self-pay | Admitting: Physician Assistant

## 2021-07-30 ENCOUNTER — Ambulatory Visit (INDEPENDENT_AMBULATORY_CARE_PROVIDER_SITE_OTHER): Payer: Medicaid Other | Admitting: Physician Assistant

## 2021-07-30 DIAGNOSIS — F4310 Post-traumatic stress disorder, unspecified: Secondary | ICD-10-CM

## 2021-07-30 DIAGNOSIS — G4700 Insomnia, unspecified: Secondary | ICD-10-CM | POA: Diagnosis not present

## 2021-07-30 DIAGNOSIS — F4323 Adjustment disorder with mixed anxiety and depressed mood: Secondary | ICD-10-CM

## 2021-07-30 MED ORDER — QUETIAPINE FUMARATE ER 150 MG PO TB24: 150.0000 mg | ORAL_TABLET | Freq: Every day | ORAL | 5 refills | Status: DC

## 2021-07-30 MED ORDER — BUSPIRONE HCL 15 MG PO TABS: 15.0000 mg | ORAL_TABLET | Freq: Three times a day (TID) | ORAL | 5 refills | Status: DC

## 2021-07-30 MED ORDER — ALPRAZOLAM 1 MG PO TABS: 1.0000 mg | ORAL_TABLET | Freq: Three times a day (TID) | ORAL | 5 refills | Status: DC | PRN
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
## 2021-09-15 DIAGNOSIS — G90521 Complex regional pain syndrome I of right lower limb: Secondary | ICD-10-CM | POA: Insufficient documentation

## 2021-09-15 DIAGNOSIS — M5459 Other low back pain: Secondary | ICD-10-CM | POA: Insufficient documentation

## 2021-11-10 DIAGNOSIS — F1120 Opioid dependence, uncomplicated: Secondary | ICD-10-CM | POA: Insufficient documentation

## 2022-01-22 ENCOUNTER — Other Ambulatory Visit: Payer: Self-pay

## 2022-01-22 ENCOUNTER — Telehealth: Payer: Self-pay | Admitting: Physician Assistant

## 2022-01-22 MED ORDER — QUETIAPINE FUMARATE ER 150 MG PO TB24: 150.0000 mg | ORAL_TABLET | Freq: Every day | ORAL | 0 refills | Status: DC

## 2022-01-22 NOTE — Telephone Encounter (Signed)
Kerry Young called today at 1:53 to request refills of her Xanax and Seroquel.  She is changing pharmacies.  Please change preferred pharmacy to [facility] on [address].  Appt 7/11 but need refills by 7/10.

## 2022-01-22 NOTE — Telephone Encounter (Signed)
Last filled 6/10, due 7/9

## 2022-01-23 MED ORDER — ALPRAZOLAM 1 MG PO TABS: 1.0000 mg | ORAL_TABLET | Freq: Three times a day (TID) | ORAL | 0 refills | Status: DC | PRN

## 2022-01-23 NOTE — Telephone Encounter (Signed)
Pended.

## 2022-01-27 ENCOUNTER — Inpatient Hospital Stay (HOSPITAL_COMMUNITY)
Admission: EM | Admit: 2022-01-27 | Discharge: 2022-01-31 | DRG: 493 | Disposition: A | Discharge status: Home Health | Payer: Medicaid Other | Attending: Student | Admitting: Student

## 2022-01-27 ENCOUNTER — Encounter (HOSPITAL_COMMUNITY): Payer: Self-pay | Admitting: Radiology

## 2022-01-27 ENCOUNTER — Emergency Department (HOSPITAL_COMMUNITY): Payer: Medicaid Other

## 2022-01-27 ENCOUNTER — Encounter: Payer: Self-pay | Admitting: Physician Assistant

## 2022-01-27 ENCOUNTER — Ambulatory Visit (INDEPENDENT_AMBULATORY_CARE_PROVIDER_SITE_OTHER): Payer: Self-pay | Admitting: Physician Assistant

## 2022-01-27 DIAGNOSIS — W109XXA Fall (on) (from) unspecified stairs and steps, initial encounter: Secondary | ICD-10-CM | POA: Diagnosis present

## 2022-01-27 DIAGNOSIS — W19XXXA Unspecified fall, initial encounter: Principal | ICD-10-CM

## 2022-01-27 DIAGNOSIS — F4323 Adjustment disorder with mixed anxiety and depressed mood: Secondary | ICD-10-CM

## 2022-01-27 DIAGNOSIS — Z886 Allergy status to analgesic agent status: Secondary | ICD-10-CM

## 2022-01-27 DIAGNOSIS — Z7902 Long term (current) use of antithrombotics/antiplatelets: Secondary | ICD-10-CM | POA: Diagnosis not present

## 2022-01-27 DIAGNOSIS — F4310 Post-traumatic stress disorder, unspecified: Secondary | ICD-10-CM | POA: Diagnosis present

## 2022-01-27 DIAGNOSIS — Z79891 Long term (current) use of opiate analgesic: Secondary | ICD-10-CM | POA: Diagnosis not present

## 2022-01-27 DIAGNOSIS — S82142A Displaced bicondylar fracture of left tibia, initial encounter for closed fracture: Principal | ICD-10-CM | POA: Diagnosis present

## 2022-01-27 DIAGNOSIS — Y92009 Unspecified place in unspecified non-institutional (private) residence as the place of occurrence of the external cause: Secondary | ICD-10-CM

## 2022-01-27 DIAGNOSIS — Z818 Family history of other mental and behavioral disorders: Secondary | ICD-10-CM

## 2022-01-27 DIAGNOSIS — S83262A Peripheral tear of lateral meniscus, current injury, left knee, initial encounter: Secondary | ICD-10-CM | POA: Diagnosis present

## 2022-01-27 DIAGNOSIS — Z79899 Other long term (current) drug therapy: Secondary | ICD-10-CM

## 2022-01-27 DIAGNOSIS — Z888 Allergy status to other drugs, medicaments and biological substances status: Secondary | ICD-10-CM | POA: Diagnosis not present

## 2022-01-27 DIAGNOSIS — D62 Acute posthemorrhagic anemia: Secondary | ICD-10-CM | POA: Diagnosis not present

## 2022-01-27 DIAGNOSIS — Z881 Allergy status to other antibiotic agents status: Secondary | ICD-10-CM

## 2022-01-27 DIAGNOSIS — F411 Generalized anxiety disorder: Secondary | ICD-10-CM

## 2022-01-27 DIAGNOSIS — G43909 Migraine, unspecified, not intractable, without status migrainosus: Secondary | ICD-10-CM | POA: Diagnosis present

## 2022-01-27 DIAGNOSIS — F17210 Nicotine dependence, cigarettes, uncomplicated: Secondary | ICD-10-CM | POA: Diagnosis present

## 2022-01-27 DIAGNOSIS — G8929 Other chronic pain: Secondary | ICD-10-CM | POA: Diagnosis present

## 2022-01-27 DIAGNOSIS — S82192A Other fracture of upper end of left tibia, initial encounter for closed fracture: Secondary | ICD-10-CM

## 2022-01-27 LAB — CBC WITH DIFFERENTIAL/PLATELET
Abs Immature Granulocytes: 0.09 10*3/uL — ABNORMAL HIGH (ref 0.00–0.07)
Basophils Absolute: 0.1 10*3/uL (ref 0.0–0.1)
Basophils Relative: 1 %
Eosinophils Absolute: 0.4 10*3/uL (ref 0.0–0.5)
Eosinophils Relative: 2 %
HCT: 43.8 % (ref 36.0–46.0)
Hemoglobin: 14.2 g/dL (ref 12.0–15.0)
Immature Granulocytes: 1 %
Lymphocytes Relative: 22 %
Lymphs Abs: 3.3 10*3/uL (ref 0.7–4.0)
MCH: 30.6 pg (ref 26.0–34.0)
MCHC: 32.4 g/dL (ref 30.0–36.0)
MCV: 94.4 fL (ref 80.0–100.0)
Monocytes Absolute: 1.1 10*3/uL — ABNORMAL HIGH (ref 0.1–1.0)
Monocytes Relative: 7 %
Neutro Abs: 9.9 10*3/uL — ABNORMAL HIGH (ref 1.7–7.7)
Neutrophils Relative %: 67 %
Platelets: 190 10*3/uL (ref 150–400)
RBC: 4.64 MIL/uL (ref 3.87–5.11)
RDW: 13.8 % (ref 11.5–15.5)
WBC: 14.8 10*3/uL — ABNORMAL HIGH (ref 4.0–10.5)
nRBC: 0 % (ref 0.0–0.2)

## 2022-01-27 LAB — BASIC METABOLIC PANEL
Anion gap: 10 (ref 5–15)
BUN: 11 mg/dL (ref 6–20)
CO2: 23 mmol/L (ref 22–32)
Calcium: 9.1 mg/dL (ref 8.9–10.3)
Chloride: 106 mmol/L (ref 98–111)
Creatinine, Ser: 0.61 mg/dL (ref 0.44–1.00)
GFR, Estimated: >60 mL/min (ref >60)
Glucose, Bld: 108 mg/dL — ABNORMAL HIGH (ref 70–99)
Potassium: 4.1 mmol/L (ref 3.5–5.1)
Sodium: 139 mmol/L (ref 135–145)

## 2022-01-27 MED ORDER — ONDANSETRON HCL 4 MG/2ML IJ SOLN
4.0000 mg | Freq: Once | INTRAMUSCULAR | Status: AC
  Administered 2022-01-27: 4 mg via INTRAVENOUS
  Filled 2022-01-27: qty 2

## 2022-01-27 MED ORDER — HYDROCODONE-ACETAMINOPHEN 7.5-325 MG PO TABS: 1.0000 | ORAL_TABLET | ORAL | Status: DC | PRN

## 2022-01-27 MED ORDER — ACETAMINOPHEN 500 MG PO TABS
500.0000 mg | ORAL_TABLET | Freq: Four times a day (QID) | ORAL | Status: DC
  Administered 2022-01-28 (×2): 500 mg via ORAL
  Filled 2022-01-27 (×2): qty 1

## 2022-01-27 MED ORDER — PROPOFOL 10 MG/ML IV BOLUS
0.5000 mg/kg | Freq: Once | INTRAVENOUS | Status: AC
  Administered 2022-01-27: 43.1 mg via INTRAVENOUS
  Filled 2022-01-27: qty 20

## 2022-01-27 MED ORDER — ACETAMINOPHEN 325 MG PO TABS: 325.0000 mg | ORAL_TABLET | Freq: Four times a day (QID) | ORAL | Status: DC | PRN

## 2022-01-27 MED ORDER — METOCLOPRAMIDE HCL 5 MG/ML IJ SOLN: 5.0000 mg | Freq: Three times a day (TID) | INTRAMUSCULAR | Status: DC | PRN

## 2022-01-27 MED ORDER — METHOCARBAMOL 1000 MG/10ML IJ SOLN: 500.0000 mg | Freq: Four times a day (QID) | INTRAVENOUS | Status: DC | PRN

## 2022-01-27 MED ORDER — DIPHENHYDRAMINE HCL 12.5 MG/5ML PO ELIX
12.5000 mg | ORAL_SOLUTION | ORAL | Status: DC | PRN
  Filled 2022-01-27 (×2): qty 10

## 2022-01-27 MED ORDER — QUETIAPINE FUMARATE ER 150 MG PO TB24: 150.0000 mg | ORAL_TABLET | Freq: Every day | ORAL | 2 refills | Status: DC

## 2022-01-27 MED ORDER — LACTATED RINGERS IV BOLUS
1000.0000 mL | Freq: Once | INTRAVENOUS | Status: AC
  Administered 2022-01-27: 1000 mL via INTRAVENOUS

## 2022-01-27 MED ORDER — HYDROMORPHONE HCL 1 MG/ML IJ SOLN
1.0000 mg | Freq: Once | INTRAMUSCULAR | Status: AC
  Administered 2022-01-27: 1 mg via INTRAVENOUS
  Filled 2022-01-27: qty 1

## 2022-01-27 MED ORDER — METHOCARBAMOL 500 MG PO TABS
500.0000 mg | ORAL_TABLET | Freq: Four times a day (QID) | ORAL | Status: DC | PRN
  Administered 2022-01-28 – 2022-01-31 (×5): 500 mg via ORAL
  Filled 2022-01-27 (×6): qty 1

## 2022-01-27 MED ORDER — METOCLOPRAMIDE HCL 10 MG PO TABS: 5.0000 mg | ORAL_TABLET | Freq: Three times a day (TID) | ORAL | Status: DC | PRN

## 2022-01-27 MED ORDER — MORPHINE SULFATE (PF) 2 MG/ML IV SOLN
0.5000 mg | INTRAVENOUS | Status: DC | PRN
  Administered 2022-01-28 (×2): 1 mg via INTRAVENOUS
  Filled 2022-01-27 (×2): qty 1

## 2022-01-27 MED ORDER — HYDROCODONE-ACETAMINOPHEN 5-325 MG PO TABS: 1.0000 | ORAL_TABLET | ORAL | Status: DC | PRN

## 2022-01-27 MED ORDER — TRAMADOL HCL 50 MG PO TABS
50.0000 mg | ORAL_TABLET | Freq: Four times a day (QID) | ORAL | Status: DC
  Administered 2022-01-28 – 2022-01-31 (×14): 50 mg via ORAL
  Filled 2022-01-27 (×14): qty 1

## 2022-01-27 NOTE — ED Provider Notes (Signed)
.  Sedation  Date/Time: 01/27/2022 11:09 PM  Performed by: Pricilla Loveless, MD Authorized by: Pricilla Loveless, MD   Consent:    Consent obtained:  Verbal and written   Consent given by:  Patient Universal protocol:    Immediately prior to procedure, a time out was called: yes     Patient identity confirmed:  Verbally with patient Indications:    Procedure performed:  Fracture reduction   Procedure necessitating sedation performed by:  Different physician Pre-sedation assessment:    Time since last food or drink:  7 hours   NPO status caution: urgency dictates proceeding with non-ideal NPO status     ASA classification: class 2 - patient with mild systemic disease     Mallampati score:  II - soft palate, uvula, fauces visible   Neck mobility: normal     Pre-sedation assessments completed and reviewed: airway patency, cardiovascular function, hydration status, mental status, nausea/vomiting, pain level, respiratory function and temperature   Immediate pre-procedure details:    Reassessment: Patient reassessed immediately prior to procedure     Reviewed: vital signs, relevant labs/tests and NPO status     Verified: bag valve mask available, emergency equipment available, intubation equipment available, IV patency confirmed, oxygen available and suction available   Procedure details (see MAR for exact dosages):    Preoxygenation:  Nasal cannula   Sedation:  Propofol   Intended level of sedation: deep   Intra-procedure monitoring:  Blood pressure monitoring, cardiac monitor, continuous pulse oximetry, continuous capnometry, frequent LOC assessments and frequent vital sign checks   Intra-procedure events: respiratory depression     Intra-procedure management:  Airway repositioning   Total Provider sedation time (minutes):  10 Post-procedure details:    Attendance: Constant attendance by certified staff until patient recovered     Recovery: Patient returned to pre-procedure baseline      Patient is stable for discharge or admission: yes     Procedure completion:  Tolerated well, no immediate complications     Pricilla Loveless, MD 01/27/22 2310

## 2022-01-27 NOTE — Sedation Documentation (Signed)
Additional 20mg  Propofol given.

## 2022-01-27 NOTE — ED Triage Notes (Signed)
Pt tripped at home and fell down two steps landing on her left leg. Pt left leg has shortening as well as left knee is deformed. Pt received a total of of IM fent.

## 2022-01-27 NOTE — ED Provider Notes (Signed)
[facility] EMERGENCY DEPARTMENT Provider Note   CSN: [id number] Arrival date & time: 01/27/22  2010     History  Chief Complaint  Patient presents with   Kerry Young is a 56 y.o. female.  56 year old female brought in by EMS from home with left leg injury.  Patient states that she was going down the stairs to get something to eat, was on the last step when her left foot got stuck in the banister causing her to fall down the last step.  She denies hitting her head or loss of consciousness.  Reports her only injury to be in her left leg with severe pain and deformity.  Plavix is on patient's medication list, she denies taking this and reports discontinuing the medication sometime last year.  She is not on any other blood thinners.       Home Medications Prior to Admission medications   Medication Sig Start Date End Date Taking? Authorizing Provider  ALPRAZolam Prudy Feeler) 1 MG tablet Take 1 tablet (1 mg total) by mouth 3 (three) times daily as needed for anxiety. 01/25/22   Melony Overly T, PA-C  atorvastatin (LIPITOR) 20 MG tablet Take 1 tablet by mouth once daily Patient not taking: Reported on 08/30/2020 09/18/19   [provider]  bisacodyl (DULCOLAX) 10 MG suppository Place 1 suppository (10 mg total) rectally daily as needed for moderate constipation. Patient not taking: Reported on 07/30/2021 02/14/21   Juliet Rude, PA-C  busPIRone (BUSPAR) 15 MG tablet Take 1 tablet (15 mg total) by mouth 3 (three) times daily. Patient not taking: Reported on 01/27/2022 07/30/21   Melony Overly T, PA-C  clopidogrel (PLAVIX) 75 MG tablet Take 1 tablet (75 mg total) by mouth daily. Patient not taking: Reported on 06/04/2021 02/15/21   Juliet Rude, PA-C  diphenhydrAMINE (BENADRYL) 25 MG tablet Take 25 mg by mouth 3 (three) times daily as needed for allergies.    [provider]  docusate sodium (COLACE) 100 MG capsule Take 1 capsule (100 mg total) by  mouth 2 (two) times daily as needed for mild constipation. 02/21/21   Maczis, Elmer Sow, PA-C  HYDROcodone-acetaminophen (NORCO/VICODIN) 5-325 MG tablet Take 2 tablets by mouth every 6 (six) hours as needed. 02/02/20   Carroll Sage, PA-C  HYDROcodone-acetaminophen (NORCO/VICODIN) 5-325 MG tablet Take 2 tablets by mouth every 4 (four) hours as needed. 02/24/21   Mancel Bale, MD  ibuprofen (ADVIL,MOTRIN) 200 MG tablet Take 600 mg by mouth every 8 (eight) hours as needed for headache.    [provider]  lidocaine (LIDODERM) 5 % Place 2 patches onto the skin daily. Remove & Discard patch within 12 hours or as directed by MD Patient not taking: Reported on 03/17/2021 02/15/21   Juliet Rude, PA-C  methocarbamol (ROBAXIN) 500 MG tablet Take 2 tablets (1,000 mg total) by mouth 4 (four) times daily. Patient not taking: Reported on 03/17/2021 02/14/21   Juliet Rude, PA-C  montelukast (SINGULAIR) 10 MG tablet Take 10 mg by mouth daily. Patient not taking: Reported on 06/04/2021 10/05/19   [provider]  montelukast (SINGULAIR) 10 MG tablet Take 10 mg by mouth daily. Patient not taking: Reported on 06/04/2021 12/11/20   [provider]  pantoprazole (PROTONIX) 40 MG tablet Take 1 tablet (40 mg total) by mouth daily. Patient not taking: Reported on 03/17/2021 02/15/21   Juliet Rude, PA-C  polyethylene glycol (MIRALAX / GLYCOLAX) 17 g packet Take  17 g by mouth daily. Patient not taking: Reported on 07/30/2021 02/15/21   Juliet Rude, PA-C  pregabalin (LYRICA) 300 MG capsule  10/02/19   [provider]  pregabalin (LYRICA) 300 MG capsule Take 300 mg by mouth 2 (two) times daily. 01/25/21   [provider]  QUEtiapine Fumarate (SEROQUEL XR) 150 MG 24 hr tablet Take 1 tablet (150 mg total) by mouth at bedtime. 01/27/22   Melony Overly T, PA-C  tiZANidine (ZANAFLEX) 4 MG tablet Take by mouth. Patient not taking: Reported on 01/27/2022 07/07/19   [provider]  tiZANidine (ZANAFLEX) 4 MG tablet Take 4 mg by mouth 3 (three) times daily. Patient not taking: Reported on 01/27/2022 01/15/21   [provider]  traMADol (ULTRAM) 50 MG tablet Take 1 tablet (50 mg total) by mouth every 6 (six) hours as needed for severe pain. Patient not taking: Reported on 03/17/2021 02/21/21   Maczis, Elmer Sow, PA-C  UBRELVY 100 MG TABS Take 1 tablet by mouth daily as needed for migraine. Patient not taking: Reported on 01/27/2022 11/18/20   [provider]      Allergies    Pregabalin, Celexa [citalopram], Naproxen, Citalopram, Naproxen, Ondansetron hcl, Other, Oxycodone-acetaminophen, Percocet [oxycodone-acetaminophen], and Zofran [ondansetron]    Review of Systems   Review of Systems Negative except as per HPI Physical Exam Updated Vital Signs BP 119/79   Pulse (!) 103   Temp 98.8 F (37.1 C) (Oral)   Resp (!) 26   Wt 86.2 kg   SpO2 100%   BMI 34.76 kg/m  Physical Exam Vitals and nursing note reviewed.  Constitutional:      General: She is not in acute distress.    Appearance: She is well-developed. She is not diaphoretic.  HENT:     Head: Normocephalic and atraumatic.     Mouth/Throat:     Mouth: Mucous membranes are moist.  Eyes:     Conjunctiva/sclera: Conjunctivae normal.  Cardiovascular:     Rate and Rhythm: Normal rate and regular rhythm.     Pulses: Normal pulses.     Heart sounds: Normal heart sounds.  Pulmonary:     Effort: Pulmonary effort is normal.     Breath sounds: Normal breath sounds.  Abdominal:     Palpations: Abdomen is soft.     Tenderness: There is no abdominal tenderness.  Musculoskeletal:        General: Swelling, tenderness, deformity and signs of injury present.     Cervical back: Normal range of motion and neck supple. No tenderness.     Comments: DP pulse present in left foot and marked.  Deformity to the left lower extremity.  Tender from left lower leg through knee and left femur.   Sensation reported intact.  Will not try to move toes.  Skin:    General: Skin is warm and dry.     Findings: No bruising, erythema or rash.  Neurological:     Mental Status: She is alert and oriented to person, place, and time.     Sensory: No sensory deficit.  Psychiatric:        Behavior: Behavior normal.        ED Results / Procedures / Treatments   Labs (all labs ordered are listed, but only abnormal results are displayed) Labs Reviewed  CBC WITH DIFFERENTIAL/PLATELET - Abnormal; Notable for the following components:      Result Value   WBC 14.8 (*)    Neutro Abs 9.9 (*)  Monocytes Absolute 1.1 (*)    Abs Immature Granulocytes 0.09 (*)    All other components within normal limits  BASIC METABOLIC PANEL - Abnormal; Notable for the following components:   Glucose, Bld 108 (*)    All other components within normal limits    EKG None  Radiology DG Knee 1-2 Views Left  Result Date: 01/27/2022 CLINICAL DATA:  Postreduction. EXAM: LEFT KNEE - 1-2 VIEW COMPARISON:  Left tibia and fibula x-ray 01/27/2022 FINDINGS: There is an acute comminuted fracture of the proximal tibia involving the tibial spines and lateral tibial plateau. Alignment is now near anatomic. There is mild depression of the lateral tibial plateau proximally 6 mm. Joint spaces are maintained. Joint effusion is present. There is soft tissue swelling surrounding the fracture. IMPRESSION: 1. Comminuted proximal tibial and tibial plateau fracture is now in near anatomic alignment. 2. Joint effusion. Electronically Signed   By: Darliss Cheney M.D.   On: 01/27/2022 23:04   DG Femur Min 2 Views Left  Result Date: 01/27/2022 CLINICAL DATA:  Fall EXAM: LEFT FEMUR 2 VIEWS COMPARISON:  None Available. FINDINGS: Acute comminuted and displaced intra-articular fracture involving the proximal tibia. Lipohemarthrosis. Possible nondisplaced fracture involving the fibular head neck. IMPRESSION: 1. Acute comminuted displaced and  impacted intra-articular fracture involving proximal tibia with lipohemarthrosis 2. Possible nondisplaced fracture involving the proximal fibula Electronically Signed   By: Jasmine Pang M.D.   On: 01/27/2022 21:33   DG Tibia/Fibula Left  Result Date: 01/27/2022 CLINICAL DATA:  Fall with pain EXAM: LEFT TIBIA AND FIBULA - 2 VIEW COMPARISON:  None Available. FINDINGS: Acute comminuted intra-articular proximal tibial fracture with impaction and about 2/3 shaft diameter posterior displacement of the distal tibia. Fibular head is not well evaluated. IMPRESSION: Acute comminuted, impacted and displaced intra-articular fracture involving the proximal tibia Electronically Signed   By: Jasmine Pang M.D.   On: 01/27/2022 21:31   DG Pelvis 1-2 Views  Result Date: 01/27/2022 CLINICAL DATA:  Fall, unable to move the leg EXAM: PELVIS - 1-2 VIEW COMPARISON:  CT 02/07/2018 FINDINGS: SI joints are non widened. Pubic symphysis and rami appear intact. No fracture or malalignment. IMPRESSION: Negative. Electronically Signed   By: Jasmine Pang M.D.   On: 01/27/2022 21:29    Procedures .Splint Application  Date/Time: 01/27/2022 11:10 PM  Performed by: Jeannie Fend, PA-C Authorized by: Jeannie Fend, PA-C   Consent:    Consent obtained:  Verbal   Consent given by:  Patient   Risks discussed:  Discoloration, swelling, pain and numbness   Alternatives discussed:  No treatment Universal protocol:    Patient identity confirmed:  Verbally with patient Pre-procedure details:    Distal neurologic exam:  Normal   Distal perfusion: distal pulses strong   Procedure details:    Location:  Leg   Leg location:  L lower leg (long left left)   Cast type:  Long leg   Splint type:  Long leg   Supplies:  Elastic bandage and fiberglass   Attestation: Splint applied and adjusted personally by me   Post-procedure details:    Distal neurologic exam:  Normal   Distal perfusion: distal pulses strong     Procedure  completion:  Tolerated     Medications Ordered in ED Medications  HYDROmorphone (DILAUDID) injection 1 mg (1 mg Intravenous Given 01/27/22 2111)  ondansetron (ZOFRAN) injection 4 mg (4 mg Intravenous Given 01/27/22 2111)  lactated ringers bolus 1,000 mL (0 mLs Intravenous Stopped 01/27/22 2306)  propofol (DIPRIVAN) 10 mg/mL bolus/IV push 43.1 mg (43.1 mg Intravenous Given 01/27/22 2232)    ED Course/ Medical Decision Making/ A&P                           Medical Decision Making Amount and/or Complexity of Data Reviewed Labs: ordered. Radiology: ordered.  Risk Prescription drug management. Decision regarding hospitalization.   This patient presents to the ED for concern of left leg injury from fall, this involves an extensive number of treatment options, and is a complaint that carries with it a high risk of complications and morbidity.  The differential diagnosis includes fracture, dislocation, strain   Co morbidities that complicate the patient evaluation  Anxiety disorder, depression, insomnia PTSD, migraines   Additional history obtained:  External records from outside source obtained and reviewed including prior review of surgical history to include right tibial nail, ORIF right humerus, ORIF left ulna, orthopedic care managed by Theodis Shove. Medication list reviewed, has not been on Plavix since last year.  Patient confirms this.   Lab Tests:  I Ordered, and personally interpreted labs.  The pertinent results include: CBC with leukocytosis likely related to trauma, WBC 14.8.  H&H normal.  BMP without significant findings.   Imaging Studies ordered:  I ordered imaging studies including x-ray left hip, left femur, left tib-fib I independently visualized and interpreted imaging which showed displaced proximal tibia fracture I agree with the radiologist interpretation   Consultations Obtained:  I requested consultation with the orthopedic surgeon, Dr. Eulah Pont,   and discussed lab and imaging findings as well as pertinent plan - they recommend: Sedation, reduction at admission   Problem List / ED Course / Critical interventions / Medication management  56 year old female brought in by EMS from home after a fall down 1-2 steps today.  Resulting in a left lower extremity injury.  Patient has an obvious deformity of her left lower leg, sensation intact, DP pulse present, will not attempt to move her toes in fear of pain.  Patient is found to have a displaced proximal left tibia fracture.  Orthopedic physician Dr. Eulah Pont was consulted, patient was sedated and fracture reduced while in the emergency room.  Ortho to admit for surgical management. I ordered medication including Dilaudid for pain Reevaluation of the patient after these medicines showed that the patient improved Sedation as managed by Dr. Criss Alvine, ER attending. I have reviewed the patients home medicines and have made adjustments as needed   Social Determinants of Health:  Lives at home   Test / Admission - Considered:  Admit for surgical management of left tibia fracture         Final Clinical Impression(s) / ED Diagnoses Final diagnoses:  Fall, initial encounter  Other closed fracture of proximal end of left tibia, initial encounter    Rx / DC Orders ED Discharge Orders     None         Jeannie Fend, PA-C 01/27/22 2311    Pricilla Loveless, MD 01/28/22 1506
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
## 2022-01-27 NOTE — Progress Notes (Signed)
Orthopedic Tech Progress Note Patient Details:  Kerry Young 1966/02/26 [id number]  Ortho Devices Type of Ortho Device: Knee Immobilizer Ortho Device/Splint Location: lle Ortho Device/Splint Interventions: Ordered, Application, Adjustment   Post Interventions Patient Tolerated: Well  Al Decant 01/27/2022, 10:56 PM
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
## 2022-01-27 NOTE — Progress Notes (Signed)
RT at bedside for procedure. ETCO2 in place. AMBU at [facility]. Patient currently on 2L [place].

## 2022-01-28 ENCOUNTER — Encounter (HOSPITAL_COMMUNITY)
Admission: EM | Disposition: A | Discharge status: Home Health | Payer: Self-pay | Source: Home / Self Care | Attending: Student

## 2022-01-28 ENCOUNTER — Inpatient Hospital Stay (HOSPITAL_COMMUNITY): Payer: Medicaid Other | Admitting: Certified Registered Nurse Anesthetist

## 2022-01-28 ENCOUNTER — Other Ambulatory Visit: Payer: Self-pay

## 2022-01-28 ENCOUNTER — Inpatient Hospital Stay (HOSPITAL_COMMUNITY): Payer: Medicaid Other

## 2022-01-28 ENCOUNTER — Encounter (HOSPITAL_COMMUNITY): Payer: Self-pay | Admitting: Orthopedic Surgery

## 2022-01-28 DIAGNOSIS — S82142A Displaced bicondylar fracture of left tibia, initial encounter for closed fracture: Secondary | ICD-10-CM

## 2022-01-28 HISTORY — PX: ORIF TIBIA PLATEAU: SHX2132

## 2022-01-28 SURGERY — OPEN REDUCTION INTERNAL FIXATION (ORIF) TIBIAL PLATEAU
Anesthesia: General | Laterality: Left

## 2022-01-28 MED ORDER — ALBUTEROL SULFATE HFA 108 (90 BASE) MCG/ACT IN AERS
INHALATION_SPRAY | RESPIRATORY_TRACT | Status: DC | PRN
  Administered 2022-01-28: 4 via RESPIRATORY_TRACT
  Administered 2022-01-28: 6 via RESPIRATORY_TRACT

## 2022-01-28 MED ORDER — PREGABALIN 100 MG PO CAPS
300.0000 mg | ORAL_CAPSULE | Freq: Two times a day (BID) | ORAL | Status: DC
  Administered 2022-01-28 – 2022-01-31 (×6): 300 mg via ORAL
  Filled 2022-01-28 (×6): qty 3

## 2022-01-28 MED ORDER — ALPRAZOLAM 0.5 MG PO TABS
1.0000 mg | ORAL_TABLET | Freq: Three times a day (TID) | ORAL | Status: DC | PRN
  Administered 2022-01-28 – 2022-01-31 (×7): 1 mg via ORAL
  Filled 2022-01-28 (×7): qty 2

## 2022-01-28 MED ORDER — DEXAMETHASONE SODIUM PHOSPHATE 10 MG/ML IJ SOLN
INTRAMUSCULAR | Status: DC | PRN
  Administered 2022-01-28: 10 mg via INTRAVENOUS

## 2022-01-28 MED ORDER — ACETAMINOPHEN 500 MG PO TABS
1000.0000 mg | ORAL_TABLET | Freq: Three times a day (TID) | ORAL | Status: DC
  Administered 2022-01-28 – 2022-01-31 (×9): 1000 mg via ORAL
  Filled 2022-01-28 (×9): qty 2

## 2022-01-28 MED ORDER — FENTANYL CITRATE (PF) 250 MCG/5ML IJ SOLN
INTRAMUSCULAR | Status: AC
  Filled 2022-01-28: qty 5

## 2022-01-28 MED ORDER — CHLORHEXIDINE GLUCONATE 0.12 % MT SOLN: 15.0000 mL | Freq: Once | OROMUCOSAL | Status: AC

## 2022-01-28 MED ORDER — VANCOMYCIN HCL 1000 MG IV SOLR
INTRAVENOUS | Status: DC | PRN
  Administered 2022-01-28: 1000 mg

## 2022-01-28 MED ORDER — HYDROMORPHONE HCL 1 MG/ML IJ SOLN
INTRAMUSCULAR | Status: AC
  Administered 2022-01-28: 0.5 mg via INTRAVENOUS
  Filled 2022-01-28: qty 1

## 2022-01-28 MED ORDER — CHLORHEXIDINE GLUCONATE 0.12 % MT SOLN
OROMUCOSAL | Status: AC
  Administered 2022-01-28: 15 mL via OROMUCOSAL
  Filled 2022-01-28: qty 15

## 2022-01-28 MED ORDER — MIDAZOLAM HCL 2 MG/2ML IJ SOLN
INTRAMUSCULAR | Status: AC
  Filled 2022-01-28: qty 2

## 2022-01-28 MED ORDER — MIDAZOLAM HCL 5 MG/5ML IJ SOLN
INTRAMUSCULAR | Status: DC | PRN
  Administered 2022-01-28: 2 mg via INTRAVENOUS

## 2022-01-28 MED ORDER — HYDROMORPHONE HCL 1 MG/ML IJ SOLN
INTRAMUSCULAR | Status: DC | PRN
  Administered 2022-01-28: .5 mg via INTRAVENOUS

## 2022-01-28 MED ORDER — CEFAZOLIN SODIUM-DEXTROSE 2-4 GM/100ML-% IV SOLN
INTRAVENOUS | Status: AC
  Filled 2022-01-28: qty 100

## 2022-01-28 MED ORDER — HYDROMORPHONE HCL 1 MG/ML IJ SOLN
INTRAMUSCULAR | Status: AC
  Filled 2022-01-28: qty 0.5

## 2022-01-28 MED ORDER — KETAMINE HCL 10 MG/ML IJ SOLN
INTRAMUSCULAR | Status: DC | PRN
  Administered 2022-01-28: 20 mg via INTRAVENOUS
  Administered 2022-01-28: 30 mg via INTRAVENOUS

## 2022-01-28 MED ORDER — PHENYLEPHRINE 80 MCG/ML (10ML) SYRINGE FOR IV PUSH (FOR BLOOD PRESSURE SUPPORT)
PREFILLED_SYRINGE | INTRAVENOUS | Status: DC | PRN
  Administered 2022-01-28: 240 ug via INTRAVENOUS
  Administered 2022-01-28: 160 ug via INTRAVENOUS
  Administered 2022-01-28: 240 ug via INTRAVENOUS
  Administered 2022-01-28: 160 ug via INTRAVENOUS

## 2022-01-28 MED ORDER — SUGAMMADEX SODIUM 200 MG/2ML IV SOLN
INTRAVENOUS | Status: DC | PRN
  Administered 2022-01-28: 200 mg via INTRAVENOUS

## 2022-01-28 MED ORDER — HYDROMORPHONE HCL 1 MG/ML IJ SOLN
0.2500 mg | INTRAMUSCULAR | Status: DC | PRN
  Administered 2022-01-28: 0.5 mg via INTRAVENOUS

## 2022-01-28 MED ORDER — POLYETHYLENE GLYCOL 3350 17 G PO PACK
17.0000 g | PACK | Freq: Every day | ORAL | Status: DC | PRN
  Administered 2022-01-30: 17 g via ORAL
  Filled 2022-01-28: qty 1

## 2022-01-28 MED ORDER — PROPOFOL 10 MG/ML IV BOLUS
INTRAVENOUS | Status: AC
  Filled 2022-01-28: qty 20

## 2022-01-28 MED ORDER — MORPHINE SULFATE (PF) 2 MG/ML IV SOLN
1.0000 mg | INTRAVENOUS | Status: DC | PRN
  Administered 2022-01-28: 2 mg via INTRAVENOUS
  Filled 2022-01-28: qty 1

## 2022-01-28 MED ORDER — OXYCODONE HCL 5 MG PO TABS
5.0000 mg | ORAL_TABLET | ORAL | Status: DC | PRN
  Administered 2022-01-29 (×4): 10 mg via ORAL
  Administered 2022-01-30: 5 mg via ORAL
  Administered 2022-01-31: 10 mg via ORAL
  Administered 2022-01-31: 5 mg via ORAL
  Administered 2022-01-31: 10 mg via ORAL
  Filled 2022-01-28 (×9): qty 2

## 2022-01-28 MED ORDER — VANCOMYCIN HCL 1000 MG IV SOLR
INTRAVENOUS | Status: AC
  Filled 2022-01-28: qty 20

## 2022-01-28 MED ORDER — FENTANYL CITRATE (PF) 250 MCG/5ML IJ SOLN
INTRAMUSCULAR | Status: DC | PRN
  Administered 2022-01-28 (×2): 50 ug via INTRAVENOUS
  Administered 2022-01-28: 25 ug via INTRAVENOUS
  Administered 2022-01-28: 50 ug via INTRAVENOUS
  Administered 2022-01-28: 25 ug via INTRAVENOUS
  Administered 2022-01-28: 50 ug via INTRAVENOUS

## 2022-01-28 MED ORDER — 0.9 % SODIUM CHLORIDE (POUR BTL) OPTIME
TOPICAL | Status: DC | PRN
  Administered 2022-01-28: 1000 mL

## 2022-01-28 MED ORDER — LACTATED RINGERS IV SOLN: INTRAVENOUS | Status: DC

## 2022-01-28 MED ORDER — ENOXAPARIN SODIUM 40 MG/0.4ML IJ SOSY
40.0000 mg | PREFILLED_SYRINGE | INTRAMUSCULAR | Status: DC
  Administered 2022-01-29 – 2022-01-31 (×3): 40 mg via SUBCUTANEOUS
  Filled 2022-01-28 (×3): qty 0.4

## 2022-01-28 MED ORDER — ALBUMIN HUMAN 5 % IV SOLN: INTRAVENOUS | Status: DC | PRN

## 2022-01-28 MED ORDER — DOCUSATE SODIUM 100 MG PO CAPS
100.0000 mg | ORAL_CAPSULE | Freq: Two times a day (BID) | ORAL | Status: DC
Start: 2022-01-28 — End: 2022-01-31
  Administered 2022-01-28 – 2022-01-31 (×6): 100 mg via ORAL
  Filled 2022-01-28 (×6): qty 1

## 2022-01-28 MED ORDER — DIPHENHYDRAMINE HCL 50 MG/ML IJ SOLN
INTRAMUSCULAR | Status: DC | PRN
  Administered 2022-01-28: 25 mg via INTRAVENOUS

## 2022-01-28 MED ORDER — ZOLPIDEM TARTRATE 5 MG PO TABS
5.0000 mg | ORAL_TABLET | Freq: Every evening | ORAL | Status: DC | PRN
  Administered 2022-01-28: 5 mg via ORAL
  Filled 2022-01-28: qty 1

## 2022-01-28 MED ORDER — ROCURONIUM BROMIDE 10 MG/ML (PF) SYRINGE
PREFILLED_SYRINGE | INTRAVENOUS | Status: DC | PRN
  Administered 2022-01-28: 100 mg via INTRAVENOUS
  Administered 2022-01-28: 10 mg via INTRAVENOUS

## 2022-01-28 MED ORDER — OXYCODONE HCL 5 MG PO TABS
10.0000 mg | ORAL_TABLET | ORAL | Status: DC | PRN
  Administered 2022-01-29: 10 mg via ORAL

## 2022-01-28 MED ORDER — CEFAZOLIN SODIUM-DEXTROSE 2-4 GM/100ML-% IV SOLN
2.0000 g | Freq: Three times a day (TID) | INTRAVENOUS | Status: AC
  Administered 2022-01-28 – 2022-01-29 (×3): 2 g via INTRAVENOUS
  Filled 2022-01-28 (×3): qty 100

## 2022-01-28 MED ORDER — HYDROMORPHONE HCL 1 MG/ML IJ SOLN
0.5000 mg | INTRAMUSCULAR | Status: DC | PRN
  Administered 2022-01-28 – 2022-01-29 (×3): 1 mg via INTRAVENOUS
  Filled 2022-01-28 (×3): qty 1

## 2022-01-28 MED ORDER — LIDOCAINE 2% (20 MG/ML) 5 ML SYRINGE
INTRAMUSCULAR | Status: DC | PRN
  Administered 2022-01-28: 60 mg via INTRAVENOUS

## 2022-01-28 MED ORDER — EPHEDRINE SULFATE-NACL 50-0.9 MG/10ML-% IV SOSY
PREFILLED_SYRINGE | INTRAVENOUS | Status: DC | PRN
  Administered 2022-01-28: 10 mg via INTRAVENOUS

## 2022-01-28 MED ORDER — PROPOFOL 10 MG/ML IV BOLUS
INTRAVENOUS | Status: DC | PRN
  Administered 2022-01-28: 30 mg via INTRAVENOUS
  Administered 2022-01-28: 120 mg via INTRAVENOUS

## 2022-01-28 MED ORDER — PHENYLEPHRINE HCL-NACL 20-0.9 MG/250ML-% IV SOLN
INTRAVENOUS | Status: DC | PRN
  Administered 2022-01-28: 25 ug/min via INTRAVENOUS

## 2022-01-28 MED ORDER — DIPHENHYDRAMINE HCL 50 MG/ML IJ SOLN
INTRAMUSCULAR | Status: AC
  Filled 2022-01-28: qty 1

## 2022-01-28 MED ORDER — KETAMINE HCL 50 MG/5ML IJ SOSY
PREFILLED_SYRINGE | INTRAMUSCULAR | Status: AC
  Filled 2022-01-28: qty 5

## 2022-01-28 MED ORDER — KETOROLAC TROMETHAMINE 15 MG/ML IJ SOLN: 15.0000 mg | Freq: Four times a day (QID) | INTRAMUSCULAR | Status: DC

## 2022-01-28 MED ORDER — ROCURONIUM BROMIDE 10 MG/ML (PF) SYRINGE
PREFILLED_SYRINGE | INTRAVENOUS | Status: AC
Start: 2022-01-28 — End: ?
  Filled 2022-01-28: qty 20

## 2022-01-28 MED ORDER — CEFAZOLIN SODIUM-DEXTROSE 2-3 GM-%(50ML) IV SOLR
INTRAVENOUS | Status: DC | PRN
  Administered 2022-01-28: 2 g via INTRAVENOUS

## 2022-01-28 MED ORDER — LIDOCAINE 2% (20 MG/ML) 5 ML SYRINGE
INTRAMUSCULAR | Status: AC
Start: 2022-01-28 — End: ?
  Filled 2022-01-28: qty 5

## 2022-01-28 MED ORDER — KETOROLAC TROMETHAMINE 15 MG/ML IJ SOLN
15.0000 mg | Freq: Four times a day (QID) | INTRAMUSCULAR | Status: DC
  Administered 2022-01-28 – 2022-01-29 (×2): 15 mg via INTRAVENOUS
  Filled 2022-01-28 (×2): qty 1

## 2022-01-28 MED ORDER — FENTANYL CITRATE (PF) 100 MCG/2ML IJ SOLN: 25.0000 ug | INTRAMUSCULAR | Status: DC | PRN

## 2022-01-28 MED ORDER — ONDANSETRON HCL 4 MG/2ML IJ SOLN
INTRAMUSCULAR | Status: AC
  Filled 2022-01-28: qty 2

## 2022-01-28 SURGICAL SUPPLY — 78 items
APL PRP STRL LF DISP 70% ISPRP (MISCELLANEOUS) ×2
APL SKNCLS STERI-STRIP NONHPOA (GAUZE/BANDAGES/DRESSINGS) ×1
BAG COUNTER SPONGE SURGICOUNT (BAG) ×2 IMPLANT
BAG SPNG CNTER NS LX DISP (BAG) ×1
BANDAGE ESMARK 6X9 LF (GAUZE/BANDAGES/DRESSINGS) ×1 IMPLANT
BENZOIN TINCTURE PRP APPL 2/3 (GAUZE/BANDAGES/DRESSINGS) ×1 IMPLANT
BIT DRILL CALIBR QC 2.8X250 (BIT) ×1 IMPLANT
BIT DRILL QC SFS 2.5X170 (BIT) ×1 IMPLANT
BLADE CLIPPER SURG (BLADE) IMPLANT
BLADE SURG 15 STRL LF DISP TIS (BLADE) ×1 IMPLANT
BLADE SURG 15 STRL SS (BLADE) ×2
BNDG CMPR 9X6 STRL LF SNTH (GAUZE/BANDAGES/DRESSINGS) ×1
BNDG ELASTIC 4X5.8 VLCR STR LF (GAUZE/BANDAGES/DRESSINGS) ×2 IMPLANT
BNDG ELASTIC 6X5.8 VLCR STR LF (GAUZE/BANDAGES/DRESSINGS) ×2 IMPLANT
BNDG ESMARK 6X9 LF (GAUZE/BANDAGES/DRESSINGS) ×2
BRUSH SCRUB EZ PLAIN DRY (MISCELLANEOUS) ×4 IMPLANT
CANISTER SUCT 3000ML PPV (MISCELLANEOUS) ×2 IMPLANT
CHLORAPREP W/TINT 26 (MISCELLANEOUS) ×4 IMPLANT
CLSR STERI-STRIP ANTIMIC 1/2X4 (GAUZE/BANDAGES/DRESSINGS) ×1 IMPLANT
COVER SURGICAL LIGHT HANDLE (MISCELLANEOUS) ×2 IMPLANT
CUFF TOURN SGL QUICK 34 (TOURNIQUET CUFF) ×4
CUFF TRNQT CYL 34X4.125X (TOURNIQUET CUFF) ×1 IMPLANT
DRAPE C-ARM 42X72 X-RAY (DRAPES) ×2 IMPLANT
DRAPE C-ARMOR (DRAPES) ×2 IMPLANT
DRAPE ORTHO SPLIT 77X108 STRL (DRAPES) ×4
DRAPE SURG ORHT 6 SPLT 77X108 (DRAPES) ×2 IMPLANT
DRAPE U-SHAPE 47X51 STRL (DRAPES) ×2 IMPLANT
ELECT REM PT RETURN 9FT ADLT (ELECTROSURGICAL) ×2
ELECTRODE REM PT RTRN 9FT ADLT (ELECTROSURGICAL) ×1 IMPLANT
GAUZE SPONGE 4X4 12PLY STRL (GAUZE/BANDAGES/DRESSINGS) ×1 IMPLANT
GLOVE BIO SURGEON STRL SZ 6.5 (GLOVE) ×6 IMPLANT
GLOVE BIO SURGEON STRL SZ7.5 (GLOVE) ×8 IMPLANT
GLOVE BIOGEL PI IND STRL 6.5 (GLOVE) ×1 IMPLANT
GLOVE BIOGEL PI IND STRL 7.5 (GLOVE) ×1 IMPLANT
GLOVE BIOGEL PI INDICATOR 6.5 (GLOVE) ×1
GLOVE BIOGEL PI INDICATOR 7.5 (GLOVE) ×1
IMMOBILIZER KNEE 22 UNIV (SOFTGOODS) ×2 IMPLANT
K-WIRE 1.6X150 (WIRE) ×2
KIT BASIN OR (CUSTOM PROCEDURE TRAY) ×2 IMPLANT
KIT TURNOVER KIT B (KITS) ×2 IMPLANT
KWIRE 1.6X150 (WIRE) IMPLANT
NDL SUT 6 .5 CRC .975X.05 MAYO (NEEDLE) ×1 IMPLANT
NEEDLE MAYO TAPER (NEEDLE) ×2
NS IRRIG 1000ML POUR BTL (IV SOLUTION) ×2 IMPLANT
PACK TOTAL JOINT (CUSTOM PROCEDURE TRAY) ×2 IMPLANT
PAD ARMBOARD 7.5X6 YLW CONV (MISCELLANEOUS) ×4 IMPLANT
PAD CAST 4YDX4 CTTN HI CHSV (CAST SUPPLIES) IMPLANT
PADDING CAST COTTON 4X4 STRL (CAST SUPPLIES) ×2
PADDING CAST COTTON 6X4 STRL (CAST SUPPLIES) ×1 IMPLANT
PLATE PROX TIBIA LEFT (Plate) ×1 IMPLANT
SCREW 3.5X60 (Screw) ×1 IMPLANT
SCREW CORTEX 3.5 28MM (Screw) ×1 IMPLANT
SCREW CORTEX 3.5 30MM (Screw) ×1 IMPLANT
SCREW CORTEX 3.5 38MM (Screw) ×1 IMPLANT
SCREW CORTEX 3.5 65MM (Screw) ×1 IMPLANT
SCREW CORTEX 3.5 70MM SELF TAP (Screw) ×1 IMPLANT
SCREW LOCK CORT ST 3.5X28 (Screw) IMPLANT
SCREW LOCK CORT ST 3.5X30 (Screw) IMPLANT
SCREW LOCK CORT ST 3.5X38 (Screw) IMPLANT
SCREW LOCKING 3.5X70MM VA (Screw) ×1 IMPLANT
SCREW VA-LOCKING 65MM 3.5 (Screw) ×2 IMPLANT
STAPLER VISISTAT 35W (STAPLE) ×2 IMPLANT
SUCTION FRAZIER HANDLE 10FR (MISCELLANEOUS) ×2
SUCTION TUBE FRAZIER 10FR DISP (MISCELLANEOUS) ×1 IMPLANT
SUT ETHILON 2 0 FS 18 (SUTURE) ×2 IMPLANT
SUT ETHILON 3 0 PS 1 (SUTURE) IMPLANT
SUT FIBERWIRE #2 38 T-5 BLUE (SUTURE) ×2
SUT MNCRL AB 3-0 PS2 18 (SUTURE) ×2 IMPLANT
SUT VIC AB 0 CT1 27 (SUTURE) ×2
SUT VIC AB 0 CT1 27XBRD ANBCTR (SUTURE) IMPLANT
SUT VIC AB 1 CT1 18XCR BRD 8 (SUTURE) IMPLANT
SUT VIC AB 1 CT1 27 (SUTURE) ×2
SUT VIC AB 1 CT1 27XBRD ANBCTR (SUTURE) ×1 IMPLANT
SUT VIC AB 1 CT1 8-18 (SUTURE)
SUT VIC AB 2-0 CT1 27 (SUTURE) ×4
SUT VIC AB 2-0 CT1 TAPERPNT 27 (SUTURE) ×2 IMPLANT
SUTURE FIBERWR #2 38 T-5 BLUE (SUTURE) IMPLANT
TOWEL GREEN STERILE (TOWEL DISPOSABLE) ×4 IMPLANT

## 2022-01-28 NOTE — Progress Notes (Addendum)
Patient arrived from PACU around 1600. Aox3. Confused on time. Patient yelling out in pain and yelling that she has to pee. Vitals checked. Patient remains on 3L oxygen. Bladder scanned for 528cc- Provider paged, no answer. I&O cath for 900cc. Post cath bladder scan 0cc.  Will continue to contact provider.  Patient friend (frank) called to give update on where she is. Mothers number given to add into chart. Patient continues to yell in pain. Ice applied to leg. Ext elevated. Dinner ordered.   1710:RN got a hold of Dr. [name] PA. Made aware about I&O cath. If patient unable to void in the next 6hrs, called back for further orders.

## 2022-01-28 NOTE — Transfer of Care (Signed)
Immediate Anesthesia Transfer of Care Note  Patient: Kerry Young  Procedure(s) Performed: OPEN REDUCTION INTERNAL FIXATION (ORIF) TIBIAL PLATEAU (Left)  Patient Location: PACU  Anesthesia Type:General  Level of Consciousness: drowsy  Airway & Oxygen Therapy: Patient Spontanous Breathing and Patient connected to face mask oxygen  Post-op Assessment: Report given to RN and Post -op Vital signs reviewed and stable  Post vital signs: Reviewed and stable  Last Vitals:  Vitals Value Taken Time  BP 120/75 01/28/22 1232  Temp    Pulse 116 01/28/22 1236  Resp 16 01/28/22 1236  SpO2 95 % 01/28/22 1236  Vitals shown include unvalidated device data.  Last Pain:  Vitals:   01/28/22 0918  TempSrc: Oral  PainSc:          Complications: No notable events documented.

## 2022-01-28 NOTE — Interval H&P Note (Signed)
History and Physical Interval Note:  01/28/2022 9:52 AM  Kerry Young  has presented today for surgery, with the diagnosis of Left Tibia plateau fracture.  The various methods of treatment have been discussed with the patient and family. After consideration of risks, benefits and other options for treatment, the patient has consented to  Procedure(s): OPEN REDUCTION INTERNAL FIXATION (ORIF) TIBIAL PLATEAU (Left) as a surgical intervention.  The patient's history has been reviewed, patient examined, no change in status, stable for surgery.  I have reviewed the patient's chart and labs.  Questions were answered to the patient's satisfaction.     Caryn Bee P Gannon Heinzman

## 2022-01-28 NOTE — Anesthesia Procedure Notes (Signed)
Procedure Name: Intubation Date/Time: 01/28/2022 10:36 AM  Performed by: Colin Benton, CRNAPre-anesthesia Checklist: Patient identified, Emergency Drugs available, Suction available and Patient being monitored Patient Re-evaluated:Patient Re-evaluated prior to induction Oxygen Delivery Method: Circle system utilized Preoxygenation: Pre-oxygenation with 100% oxygen Induction Type: IV induction Ventilation: Mask ventilation without difficulty Laryngoscope Size: Mac and 3 Grade View: Grade I Tube type: Oral Tube size: 7.0 mm Number of attempts: 1 Airway Equipment and Method: Stylet Placement Confirmation: ETT inserted through vocal cords under direct vision, positive ETCO2 and breath sounds checked- equal and bilateral Secured at: 22 cm Tube secured with: Tape Dental Injury: Teeth and Oropharynx as per pre-operative assessment

## 2022-01-28 NOTE — Progress Notes (Signed)
Orthopedic Tech Progress Note Patient Details:  [name] 12-15-1965 [id number]  Called in order to [facility] for a [place] [facility]   Patient ID: Kerry Young, female   DOB: [birth year], 56 y.o.   MRN: [id number]  [name] 01/28/2022, 1:46 PM

## 2022-01-28 NOTE — ED Notes (Signed)
Report given to [facility] in short stay, pt OTF for surgery

## 2022-01-28 NOTE — Discharge Instructions (Signed)
Orthopaedic Trauma Service Discharge Instructions   General Discharge Instructions  WEIGHT BEARING STATUS:Non-weightbearing left lower extremity  RANGE OF MOTION/ACTIVITY: Ok for range of motion of knee as tolerated in hinged knee brace  Wound Care: You may remove your surgical dressing on post-op day #2 (Friday 01/30/22). Incisions can be left open to air if there is no drainage. If incision continues to have drainage, follow wound care instructions below. Okay to shower if no drainage from incisions.  DVT/PE prophylaxis:  Eliquis 2.5 mg twice daily x 30 days  Diet: as you were eating previously.  Can use over the counter stool softeners and bowel preparations, such as Miralax, to help with bowel movements.  Narcotics can be constipating.  Be sure to drink plenty of fluids  PAIN MEDICATION USE AND EXPECTATIONS  You have likely been given narcotic medications to help control your pain.  After a traumatic event that results in an fracture (broken bone) with or without surgery, it is ok to use narcotic pain medications to help control one's pain.  We understand that everyone responds to pain differently and each individual patient will be evaluated on a regular basis for the continued need for narcotic medications. Ideally, narcotic medication use should last no more than 6-8 weeks (coinciding with fracture healing).   As a patient it is your responsibility as well to monitor narcotic medication use and report the amount and frequency you use these medications when you come to your office visit.   We would also advise that if you are using narcotic medications, you should take a dose prior to therapy to maximize you participation.  IF YOU ARE ON NARCOTIC MEDICATIONS IT IS NOT PERMISSIBLE TO OPERATE A MOTOR VEHICLE (MOTORCYCLE/CAR/TRUCK/MOPED) OR HEAVY MACHINERY DO NOT MIX NARCOTICS WITH OTHER CNS (CENTRAL NERVOUS SYSTEM) DEPRESSANTS SUCH AS ALCOHOL   STOP SMOKING OR USING NICOTINE  PRODUCTS!!!!  As discussed nicotine severely impairs your body's ability to heal surgical and traumatic wounds but also impairs bone healing.  Wounds and bone heal by forming microscopic blood vessels (angiogenesis) and nicotine is a vasoconstrictor (essentially, shrinks blood vessels).  Therefore, if vasoconstriction occurs to these microscopic blood vessels they essentially disappear and are unable to deliver necessary nutrients to the healing tissue.  This is one modifiable factor that you can do to dramatically increase your chances of healing your injury.    (This means no smoking, no nicotine gum, patches, etc)  DO NOT USE NONSTEROIDAL ANTI-INFLAMMATORY DRUGS (NSAID'S)  Using products such as Advil (ibuprofen), Aleve (naproxen), Motrin (ibuprofen) for additional pain control during fracture healing can delay and/or prevent the healing response.  If you would like to take over the counter (OTC) medication, Tylenol (acetaminophen) is ok.  However, some narcotic medications that are given for pain control contain acetaminophen as well. Therefore, you should not exceed more than 4000 mg of tylenol in a day if you do not have liver disease.  Also note that there are may OTC medicines, such as cold medicines and allergy medicines that my contain tylenol as well.  If you have any questions about medications and/or interactions please ask your doctor/PA or your pharmacist.      ICE AND ELEVATE INJURED/OPERATIVE EXTREMITY  Using ice and elevating the injured extremity above your heart can help with swelling and pain control.  Icing in a pulsatile fashion, such as 20 minutes on and 20 minutes off, can be followed.    Do not place ice directly on skin. Make sure  there is a barrier between to skin and the ice pack.    Using frozen items such as frozen peas works well as the conform nicely to the are that needs to be iced.  USE AN ACE WRAP OR TED HOSE FOR SWELLING CONTROL  In addition to icing and elevation,  Ace wraps or TED hose are used to help limit and resolve swelling.  It is recommended to use Ace wraps or TED hose until you are informed to stop.    When using Ace Wraps start the wrapping distally (farthest away from the body) and wrap proximally (closer to the body)   Example: If you had surgery on your leg or thing and you do not have a splint on, start the ace wrap at the toes and work your way up to the thigh        If you had surgery on your upper extremity and do not have a splint on, start the ace wrap at your fingers and work your way up to the upper arm   CALL THE OFFICE WITH ANY QUESTIONS OR CONCERNS: [phone]   VISIT OUR WEBSITE FOR ADDITIONAL INFORMATION: [email or web address]    Discharge Wound Care Instructions  Do NOT apply any ointments, solutions or lotions to pin sites or surgical wounds.  These prevent needed drainage and even though solutions like hydrogen peroxide kill bacteria, they also damage cells lining the pin sites that help fight infection.  Applying lotions or ointments can keep the wounds moist and can cause them to breakdown and open up as well. This can increase the risk for infection. When in doubt call the office.  If any drainage is noted, use one layer of adaptic or Mepitel, then gauze, Kerlix, and an ace wrap. - These dressing supplies should be available at local medical supply stores [facility], [facility], etc) as well as Insurance claims handler ([facility], [facility], [place], etc)  Once the incision is completely dry and without drainage, it may be left open to air out.  Showering may begin 36-48 hours later.  Cleaning gently with soap and water.
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
## 2022-01-28 NOTE — Progress Notes (Addendum)
Pain continues to yell in 10/10 pain. Morphine already given. Patient denies tramadol says it makes her sick. Patient states she cant take hydrocodone because she has a bad reaction to it. Patient states my mom told me to take percocet. I asked the patient if she had an allergy to it she said she does not know. Currently in patient allergy list it has percocet.  Every time the patient is in pain, the patient will call out screaming for someone to call her mom. RN made aware we will not be calling her mom every time she is having pain. RN reminded patient that she was just given morphine. Patient refused ice pack.  Dr. [name] PA called to give an update on patient behavior and pain complaint.   Ortho PA on call paged to come to bedside to see patient. In the meantime Drsg removed due to patient saying Ace wrap was to tight. Ext elevated but patient refuses and will not keep leg elevated and states we are hurting her.

## 2022-01-28 NOTE — ED Notes (Signed)
Patient wanted her boyfriend called Julious Payer, pt spoke with him and wanted me to update him of which I did

## 2022-01-28 NOTE — Plan of Care (Signed)
?  Problem: Clinical Measurements: ?Goal: Ability to maintain clinical measurements within normal limits will improve ?Outcome: Progressing ?Goal: Will remain free from infection ?Outcome: Progressing ?Goal: Diagnostic test results will improve ?Outcome: Progressing ?Goal: Respiratory complications will improve ?Outcome: Progressing ?Goal: Cardiovascular complication will be avoided ?Outcome: Progressing ?  ?Problem: Activity: ?Goal: Risk for activity intolerance will decrease ?Outcome: Progressing ?  ?Problem: Elimination: ?Goal: Will not experience complications related to bowel motility ?Outcome: Progressing ?Goal: Will not experience complications related to urinary retention ?Outcome: Progressing ?  ?Problem: Pain Managment: ?Goal: General experience of comfort will improve ?Outcome: Progressing ?  ?Problem: Safety: ?Goal: Ability to remain free from injury will improve ?Outcome: Progressing ?  ?Problem: Skin Integrity: ?Goal: Risk for impaired skin integrity will decrease ?Outcome: Progressing ?  ?

## 2022-01-28 NOTE — Op Note (Signed)
Orthopaedic Surgery Operative Note (CSN: [id number] ) Date of Surgery: 01/28/2022  Admit Date: 01/27/2022   Diagnoses: Pre-Op Diagnoses: Left bicondylar tibial plateau fracture  Post-Op Diagnosis: Left bicondylar tibial plateau fracture Left lateral meniscus tear  Procedures: CPT 27536-Open reduction internal fixation of left bicondylar tibial plateau fracture CPT 27403-Repair of left lateral mensicus tear  Surgeons : Primary: Roby Lofts, MD  Assistant: Ulyses Southward, PA-C  Location: OR 7   Anesthesia:General   Antibiotics: Ancef 2g preop with 1 gm vancomycin powder placed topically   Tourniquet time: * Missing tourniquet times found for documented tourniquets in log: [id number] * Total Tourniquet Time Documented: Thigh (Left) - 65 minutes Total: Thigh (Left) - 65 minutes  Estimated Blood Loss:100 mL  Complications:None   Specimens:None   Implants: Implant Name Type Inv. Item Serial No. Manufacturer Lot No. LRB No. Used Action  SCREW CORTEX 3.5 - [id number] Screw SCREW CORTEX 3.5  [employer]  Left 1 Implanted  PLATE PROX TIBIA LEFT - [id number] Plate PLATE PROX TIBIA LEFT  [employer]  Left 1 Implanted  SCREW CORTEX 3.5 SELF TAP - [id number] Screw SCREW CORTEX 3.5 SELF TAP  [employer]  Left 1 Implanted  SCREW CORTEX 3.5 - [id number] Screw SCREW CORTEX 3.5  [employer]  Left 1 Implanted  SCREW CORTEX 3.5 - [id number] Screw SCREW CORTEX 3.5  [employer]  Left 1 Implanted  SCREW CORTEX 3.5 - [id number] Screw SCREW CORTEX 3.5  [employer]  Left 1 Implanted  SCREW VA-LOCKING 3.5 - [id number] Screw SCREW VA-LOCKING 3.5  DEPU[employer]TH[employer]  Left 2 Implanted  SCREW 3.5X60 - [id number] Screw SCREW 3.5X60  [employer]TH[employer]  Left 1 Implanted  SCREW LOCKING 3.5X70MM VA - [id number] Screw SCREW LOCKING 3.5X70MM VA  [employer]  Left 1 Implanted     Indications for  Surgery: 56 year old female who sustained a left bicondylar tibial plateau fracture.  Due to the unstable nature of her injury I recommend proceeding with open reduction internal fixation.  Risks and benefits were discussed with the patient.  Risks include but not limited to bleeding, infection, malunion, nonunion, hardware failure, hardware irritation, nerve or blood vessel injury, knee instability, posttraumatic arthritis, knee stiffness, even the possibility anesthetic complications.  Patient agreed to proceed with surgery and consent was obtained.  Operative Findings: 1.  Open reduction internal fixation of left bicondylar tibial plateau fracture using Synthes VA 3.5 mm proximal tibial locking plate 2.  Peripheral lateral meniscus tear treated with open repair using a #2 FiberWire suture in a horizontal mattress fashion.  Procedure: The patient was identified in the preoperative holding area. Consent was confirmed with the patient and their family and all questions were answered. The operative extremity was marked after confirmation with the patient. she was then brought back to the operating room by our anesthesia colleagues.  She was placed under general anesthetic and carefully transferred over to radiolucent flat top table.  A bump was placed under her operative hip.  A nonsterile tourniquet was placed in the upper thigh.  The left lower extremity was prepped and draped in usual sterile fashion.  A timeout was performed to verify the patient, the procedure, and the extremity.  Preoperative antibiotics were dosed.  The hip and knee were flexed over a triangle.  The tourniquet was inflated to 250 mmHg.  Fluoroscopic imaging showed the unstable nature of her injury.  A standard lateral parapatellar incision was made  and carried down through skin and subcutaneous tissue.  I incised through the IT band and developed the interval between the IT band and the capsule.  Fortunately anterior capsule had  pulled off of the tibia.  There was a meniscus tear that was present.  I performed and completed the submeniscal arthrotomy.  I cleaned out the hematoma from the lateral split of the condyle and I proceeded to place a #2 FiberWire suture through the capsule in a horizontal mattress fashion in the peripheral aspect of the meniscus.  I then dissected subcutaneously around the patellar tendon to be able to access and palpate the medial condyle and the reduction to the medial metaphysis to manipulate the shaft segment with a reduction with K wires on the medial side into the metaphysis as well as K wires from the lateral side into the medial condyle.  Reduce the lateral condyle back to the tibial shaft and used a clamp to hold this in place.  I then confirmed reduction and then placed a millimeter screw between the 2 condyles.  I then remove portion of the K wires and proceeded to place a Synthes VA 3.5 mm proximal tibial locking plate and slid this submuscularly along the lateral cortex of the tibia.  I held it provisionally with a K wire proximally.  I then drilled and placed a nonlocking screw to bring the proximal portion of the plate flush to bone.  Then percutaneously placed 3.5 millimeter screws in the tibial shaft to bring the distal portion of the plate flush to bone.  I then returned to the proximal segment and proceeded to place locking screws to hold the condylar reduction.  I then placed a kickstand screw to provide medial buttress support for the medial metaphysis and condyle.  Final fluoroscopic imaging was obtained.  The incision was copiously irrigated.  A free needle was used to repair the tag sutures for the capsule back down to the plate.  I then tied the FiberWire sutures for the meniscus repair.  A gram of vancomycin powder was placed into the incision.  A layered closure of 0 Vicryl, 2-0 Vicryl 3-0 Monocryl and Steri-Strips were placed.  A sterile dressing was applied.  The patient was then  had her knee examined which showed some anterior posterior laxity and as a result we proceeded to place her in a hinged knee brace.  Patient was then awoke from anesthesia and taken to the PACU in stable condition.  Post Op Plan/Instructions: Patient will be nonweightbearing to the left lower extremity.  She will have unrestricted range of motion of the left knee.  We will receive postoperative Ancef.  Lovenox for DVT prophylaxis.  We will have her mobilize with physical and occupational therapy.  I was present and performed the entire surgery.  Ulyses Southward, PA-C did assist me throughout the case. An assistant was necessary given the difficulty in approach, maintenance of reduction and ability to instrument the fracture.   Truitt Merle, MD [facility]

## 2022-01-28 NOTE — H&P (Signed)
Orthopaedic Trauma Service (OTS) Consult   Patient ID: Kerry Young MRN: [id number] DOB/AGE: 01/29/1966 56 y.o.  Reason for Consult:Left bicondylar tibial plateau fracture  Referring Physician: Dr. Renaye Rakers, MD [facility] Harness  HPI: Kerry Young is an 56 y.o. female Who is being seen in consultation at the request Dr. Eulah Pont for evaluation of left tibial plateau fracture.  She was going down the stairs when she got stuck and fell down the last step.  She had immediate pain and deformity through her leg.  She was taken to the emergency room where x-rays showed a bicondylar tibial plateau fracture.  It was significantly displaced and closed reduction and splinting was done by Dr. Eulah Pont.  Due to complexity of her injury he felt that this was outside the scope of practice and required treatment by an orthopedic traumatologist.  Patient was seen and evaluated in the emergency room.  Currently complaining a lot of pain also complaining of being thirsty and not being able to eat anything.  She lives at home with her boyfriend who is a long-distance Naval architect as well as her boyfriend's son who is a Paediatric nurse and they are not home most of the time.  She has had multiple surgeries previously by the [facility] practice.  She has recovered from these.  She is on chronic narcotics at baseline.  She is on multiple medications for anxiety as well as PTSD.  She ambulates without assist device at baseline.  Past Medical History:  Diagnosis Date   Anxiety    Migraines    PONV (postoperative nausea and vomiting)    PTSD (post-traumatic stress disorder)    Sciatica    UTI (urinary tract infection)     Past Surgical History:  Procedure Laterality Date   COLPOSCOPY N/A 10/26/2019   Procedure: COLPOSCOPY with cervical biopsy;  Surgeon: Myna Hidalgo, DO;  Location: [facility];  Service: Gynecology;  Laterality: N/A;   ORIF HUMERUS FRACTURE Right 02/07/2021   Procedure: Arthroscopic repair  rotator cuff and fixation of PROXIMAL HUMERUS FRACTURE;  Surgeon: Bjorn Pippin, MD;  Location: [facility] OR;  Service: Orthopedics;  Laterality: Right;   ORIF ULNAR FRACTURE Left 02/18/2021   Procedure: OPEN REDUCTION INTERNAL FIXATION (ORIF) ULNAR FRACTURE;  Surgeon: Sheral Apley, MD;  Location: [facility] OR;  Service: Orthopedics;  Laterality: Left;   TIBIA IM NAIL INSERTION Right 02/05/2021   Procedure: INTRAMEDULLARY (IM) NAIL TIBIAL;  Surgeon: Bjorn Pippin, MD;  Location: [facility] OR;  Service: Orthopedics;  Laterality: Right;   WISDOM TOOTH EXTRACTION      Family History  Problem Relation Age of Onset   Anxiety disorder Mother    Hypertension Mother    Kidney failure Mother    Heart attack Paternal Grandfather    Healthy Daughter     Social History:  reports that she has been smoking cigarettes. She has been smoking an average of .15 packs per day. She has never used smokeless tobacco. She reports that she does not currently use alcohol. She reports that she does not use drugs.  Allergies:  Allergies  Allergen Reactions   Pregabalin Other (See Comments) and Palpitations    CHEST TIGHTNESS CHEST TIGHTNESS    Celexa [Citalopram] Nausea And Vomiting    Took it 2 days. Had n/v and migraine   Naproxen Other (See Comments)   Citalopram Nausea And Vomiting   Naproxen Other (See Comments)    UNK Reaction   Ondansetron Hcl Other (See Comments)  Headache  Headache   Other Nausea And Vomiting    ALL ANTIBIOTICS   Oxycodone-Acetaminophen Itching and Nausea And Vomiting   Percocet [Oxycodone-Acetaminophen] Itching and Nausea Only   Zofran [Ondansetron] Other (See Comments)    Medications:  No current facility-administered medications on file prior to encounter.   Current Outpatient Medications on File Prior to Encounter  Medication Sig Dispense Refill   ALPRAZolam (XANAX) 1 MG tablet Take 1 tablet (1 mg total) by mouth 3 (three) times daily as needed for anxiety. 90 tablet 0    atorvastatin (LIPITOR) 20 MG tablet Take 1 tablet by mouth once daily (Patient not taking: Reported on 08/30/2020)     bisacodyl (DULCOLAX) 10 MG suppository Place 1 suppository (10 mg total) rectally daily as needed for moderate constipation. (Patient not taking: Reported on 07/30/2021) 12 suppository 0   busPIRone (BUSPAR) 15 MG tablet Take 1 tablet (15 mg total) by mouth 3 (three) times daily. (Patient not taking: Reported on 01/27/2022) 90 tablet 5   clopidogrel (PLAVIX) 75 MG tablet Take 1 tablet (75 mg total) by mouth daily. (Patient not taking: Reported on 06/04/2021)     diphenhydrAMINE (BENADRYL) 25 MG tablet Take 25 mg by mouth 3 (three) times daily as needed for allergies.     docusate sodium (COLACE) 100 MG capsule Take 1 capsule (100 mg total) by mouth 2 (two) times daily as needed for mild constipation. 10 capsule 0   HYDROcodone-acetaminophen (NORCO/VICODIN) 5-325 MG tablet Take 2 tablets by mouth every 6 (six) hours as needed. 10 tablet 0   HYDROcodone-acetaminophen (NORCO/VICODIN) 5-325 MG tablet Take 2 tablets by mouth every 4 (four) hours as needed. 15 tablet 0   ibuprofen (ADVIL,MOTRIN) 200 MG tablet Take 600 mg by mouth every 8 (eight) hours as needed for headache.     lidocaine (LIDODERM) 5 % Place 2 patches onto the skin daily. Remove & Discard patch within 12 hours or as directed by MD (Patient not taking: Reported on 03/17/2021) 30 patch 0   methocarbamol (ROBAXIN) 500 MG tablet Take 2 tablets (1,000 mg total) by mouth 4 (four) times daily. (Patient not taking: Reported on 03/17/2021)     montelukast (SINGULAIR) 10 MG tablet Take 10 mg by mouth daily. (Patient not taking: Reported on 06/04/2021)     montelukast (SINGULAIR) 10 MG tablet Take 10 mg by mouth daily. (Patient not taking: Reported on 06/04/2021)     pantoprazole (PROTONIX) 40 MG tablet Take 1 tablet (40 mg total) by mouth daily. (Patient not taking: Reported on 03/17/2021)     polyethylene glycol (MIRALAX / GLYCOLAX) 17 g  packet Take 17 g by mouth daily. (Patient not taking: Reported on 07/30/2021) 14 each 0   pregabalin (LYRICA) 300 MG capsule      pregabalin (LYRICA) 300 MG capsule Take 300 mg by mouth 2 (two) times daily.     QUEtiapine Fumarate (SEROQUEL XR) 150 MG 24 hr tablet Take 1 tablet (150 mg total) by mouth at bedtime. 30 tablet 2   tiZANidine (ZANAFLEX) 4 MG tablet Take by mouth. (Patient not taking: Reported on 01/27/2022)     tiZANidine (ZANAFLEX) 4 MG tablet Take 4 mg by mouth 3 (three) times daily. (Patient not taking: Reported on 01/27/2022)     traMADol (ULTRAM) 50 MG tablet Take 1 tablet (50 mg total) by mouth every 6 (six) hours as needed for severe pain. (Patient not taking: Reported on 03/17/2021) 20 tablet 0   UBRELVY 100 MG TABS Take 1 tablet by  mouth daily as needed for migraine. (Patient not taking: Reported on 01/27/2022)       ROS: Constitutional: No fever or chills Vision: No changes in vision ENT: No difficulty swallowing CV: No chest pain Pulm: No SOB or wheezing GI: No nausea or vomiting GU: No urgency or inability to hold urine Skin: No poor wound healing Neurologic: No numbness or tingling Psychiatric: No depression or anxiety Heme: No bruising Allergic: No reaction to medications or food   Exam: Blood pressure 96/66, pulse 94, temperature 98.6 F (37 C), temperature source Oral, resp. rate 20, height 5\' 2"  (1.575 m), weight 86.2 kg, SpO2 95 %. General:No acute distress Orientation:Awake and alert Mood and Affect: Cooperative and appropriate Gait: Unable to asses due to her fracture Coordination and balance: Within normal limits  Left lower extremity: Splint is in place and is clean dry and intact.  Knee immobilizer is in place.  Compartments are soft compressible.  Swelling is appropriate over the potential surgical incision.  She has active dorsiflexion plantarflexion of her toes and ankle.  It is somewhat weak secondary to pain.  She has a warm well-perfused foot with  brisk cap refill less than 2 seconds.  2+ DP pulses.  Right lower extremity: Skin without lesions. No tenderness to palpation. Full painless ROM, full strength in each muscle groups without evidence of instability.   Medical Decision Making: Data: Imaging: X-rays and CT scan are reviewed which shows a bicondylar tibial plateau fracture.  Postreduction CT shows a well aligned fracture.  Labs:  Results for orders placed or performed during the hospital encounter of 01/27/22 (from the past 24 hour(s))  CBC with Differential     Status: Abnormal   Collection Time: 01/27/22  8:38 PM  Result Value Ref Range   WBC 14.8 (H) 4.0 - 10.5 K/uL   RBC 4.64 3.87 - 5.11 MIL/uL   Hemoglobin 14.2 12.0 - 15.0 g/dL   HCT 03/30/22 44.6 - 28.6 %   MCV 94.4 80.0 - 100.0 fL   MCH 30.6 26.0 - 34.0 pg   MCHC 32.4 30.0 - 36.0 g/dL   RDW 38.1 77.1 - 16.5 %   Platelets 190 150 - 400 K/uL   nRBC 0.0 0.0 - 0.2 %   Neutrophils Relative % 67 %   Neutro Abs 9.9 (H) 1.7 - 7.7 K/uL   Lymphocytes Relative 22 %   Lymphs Abs 3.3 0.7 - 4.0 K/uL   Monocytes Relative 7 %   Monocytes Absolute 1.1 (H) 0.1 - 1.0 K/uL   Eosinophils Relative 2 %   Eosinophils Absolute 0.4 0.0 - 0.5 K/uL   Basophils Relative 1 %   Basophils Absolute 0.1 0.0 - 0.1 K/uL   Immature Granulocytes 1 %   Abs Immature Granulocytes 0.09 (H) 0.00 - 0.07 K/uL  Basic metabolic panel     Status: Abnormal   Collection Time: 01/27/22  8:38 PM  Result Value Ref Range   Sodium 139 135 - 145 mmol/L   Potassium 4.1 3.5 - 5.1 mmol/L   Chloride 106 98 - 111 mmol/L   CO2 23 22 - 32 mmol/L   Glucose, Bld 108 (H) 70 - 99 mg/dL   BUN 11 6 - 20 mg/dL   Creatinine, Ser 03/30/22 0.44 - 1.00 mg/dL   Calcium 9.1 8.9 - 3.83 mg/dL   GFR, Estimated 33.8 >32 mL/min   Anion gap 10 5 - 15     Imaging or Labs ordered: None  Medical history and chart was  reviewed and case discussed with medical provider.  Assessment/Plan: 56 year old female status post a fall with left  tibial plateau fracture.  Patient will require formal open reduction internal fixation.  Risks and benefits were discussed with the patient.  Risks included but not limited to bleeding, infection, malunion, nonunion, hardware failure, hardware irritation, nerve blood vessel injury, posttraumatic arthritis, knee stiffness, DVT, even the possibility anesthetic complication she agreed to proceed with surgery and consent was obtained.  Roby Lofts, MD [facility] [phone] (office) [email or web address]

## 2022-01-28 NOTE — Progress Notes (Signed)
On call provider was called this evening by nursing staff. Patient stated she was in extreme pain and was complaining of tightness in her lower leg. RN had removed the dressing and attempted to elevate the leg as recommended, but the patient refused. The patient stated she wanted to be seen by a provider and would not stop yelling out until she did so.  I saw the patient on [address]. When I walked by the patient's room and stood at the doorway, she was comfortably eating her dinner and writing something down on a piece of paper. When I entered the room and introduced myself, the patient immediately began to scream. I attempted to have a conversation with the patient, but she would not stop screaming. When asked where her pain was, she stated "she could not reach it." I began to examine her lower leg. I removed the rest of the bandages. She continued to scream out in pain even when I was not touching her. Her compartments were soft and compressible. She had no pain with light touch. She endorsed distal sensation. She was never able to consistently tell me where she was hurting. She was able to wiggle her toes slightly. Her incisions were pristine and no signs of drainage. She has a warm well perfused foot. DP pulse +2.   Patient requested oxycodone, dilaudid, and fentanyl. I discussed with her that these had not been ordered as it was listed in her chart as an allergy. She stated she does not have an allergy and was given this at her last hospital stay. She then requests that if we cannot adequately manage her pain, she wanted be transferred back to "[facility]" where she had her first surgery. I discussed with her she is at [facility] currently. I discussed again that oxycodone is listed as an allergy and we would not want to give her anything to cause her harm. She states that she "swears on her life" that she is "not allergic." I discussed with her we will switch her from Norco to Oxycodone and  Morphine to Dilaudid. Patient again states she does not have an allergy to this. Upon chart review, patient had previously taken these medications without issues.   We will leave dressings off. I encouraged the patient to elevate and ice as this will help with the tightness and swelling. She states that she will do so once she gets her "oxycodone or dilaudid."  Again patient had no concerning exam findings at this time. We will change her current medications to help manage her pain more appropriately.   Patient became immediately more relaxed and calm once she was told her medications would be changed.   Current regimen: - Tylenol 1000 mg every 8 hours - Toradol 15 mg every 6 hours for 4 doses - Lyrica 300 mg BID - per chart review patient takes this daily under her current pain management regimen - Robaxin 500 mg every 8 hours as needed - Oxycodone 5-10 mg every 4 hours - moderate pain, pain score 4-6 - Oxycodone 10-15 mg every 4 hours - severe pain, pain score >7 - Dilaudid 0.5-1mg  every 4 hours as needed for severe pain not controlled by PO  [name], PA-C 01/28/22

## 2022-01-29 ENCOUNTER — Encounter (HOSPITAL_COMMUNITY): Payer: Self-pay | Admitting: Student

## 2022-01-29 LAB — BASIC METABOLIC PANEL
Anion gap: 11 (ref 5–15)
BUN: 9 mg/dL (ref 6–20)
CO2: 24 mmol/L (ref 22–32)
Calcium: 8.8 mg/dL — ABNORMAL LOW (ref 8.9–10.3)
Chloride: 105 mmol/L (ref 98–111)
Creatinine, Ser: 0.59 mg/dL (ref 0.44–1.00)
GFR, Estimated: >60 mL/min (ref >60)
Glucose, Bld: 135 mg/dL — ABNORMAL HIGH (ref 70–99)
Potassium: 4.4 mmol/L (ref 3.5–5.1)
Sodium: 140 mmol/L (ref 135–145)

## 2022-01-29 LAB — CBC
HCT: 32 % — ABNORMAL LOW (ref 36.0–46.0)
Hemoglobin: 10.6 g/dL — ABNORMAL LOW (ref 12.0–15.0)
MCH: 30.3 pg (ref 26.0–34.0)
MCHC: 33.1 g/dL (ref 30.0–36.0)
MCV: 91.4 fL (ref 80.0–100.0)
Platelets: 225 10*3/uL (ref 150–400)
RBC: 3.5 MIL/uL — ABNORMAL LOW (ref 3.87–5.11)
RDW: 13.5 % (ref 11.5–15.5)
WBC: 11.9 10*3/uL — ABNORMAL HIGH (ref 4.0–10.5)
nRBC: 0 % (ref 0.0–0.2)

## 2022-01-29 LAB — VITAMIN D 25 HYDROXY (VIT D DEFICIENCY, FRACTURES): Vit D, 25-Hydroxy: 7.18 ng/mL — ABNORMAL LOW (ref 30–100)

## 2022-01-29 MED ORDER — KETOROLAC TROMETHAMINE 15 MG/ML IJ SOLN
15.0000 mg | Freq: Four times a day (QID) | INTRAMUSCULAR | Status: DC
  Administered 2022-01-29 – 2022-01-31 (×9): 15 mg via INTRAVENOUS
  Filled 2022-01-29 (×9): qty 1

## 2022-01-29 MED ORDER — NICOTINE POLACRILEX 2 MG MT GUM
2.0000 mg | CHEWING_GUM | OROMUCOSAL | Status: DC | PRN
  Filled 2022-01-29: qty 1

## 2022-01-29 MED ORDER — VITAMIN D (ERGOCALCIFEROL) 1.25 MG (50000 UNIT) PO CAPS
50000.0000 [IU] | ORAL_CAPSULE | ORAL | 0 refills | Status: DC
  Filled 2022-01-29: qty 8, 56d supply, fill #0

## 2022-01-29 MED ORDER — QUETIAPINE FUMARATE ER 50 MG PO TB24
150.0000 mg | ORAL_TABLET | Freq: Every day | ORAL | Status: DC
  Administered 2022-01-29 – 2022-01-30 (×2): 150 mg via ORAL
  Filled 2022-01-29 (×3): qty 3

## 2022-01-29 MED ORDER — HYDROMORPHONE HCL 1 MG/ML IJ SOLN
0.5000 mg | INTRAMUSCULAR | Status: DC | PRN
  Administered 2022-01-29 – 2022-01-30 (×5): 1 mg via INTRAVENOUS
  Filled 2022-01-29 (×6): qty 1

## 2022-01-29 MED ORDER — METHOCARBAMOL 500 MG PO TABS
500.0000 mg | ORAL_TABLET | Freq: Four times a day (QID) | ORAL | 0 refills | Status: DC | PRN
  Filled 2022-01-29: qty 28, 7d supply, fill #0

## 2022-01-29 MED ORDER — OXYCODONE HCL 10 MG PO TABS
5.0000 mg | ORAL_TABLET | ORAL | 0 refills | Status: DC | PRN
  Filled 2022-01-29: qty 42, 7d supply, fill #0

## 2022-01-29 MED ORDER — ACETAMINOPHEN 500 MG PO TABS: 1000.0000 mg | ORAL_TABLET | Freq: Three times a day (TID) | ORAL | 0 refills | Status: DC | PRN

## 2022-01-29 MED ORDER — APIXABAN 2.5 MG PO TABS
2.5000 mg | ORAL_TABLET | Freq: Two times a day (BID) | ORAL | 0 refills | Status: DC
  Filled 2022-01-29: qty 60, 30d supply, fill #0

## 2022-01-29 MED ORDER — VITAMIN D (ERGOCALCIFEROL) 1.25 MG (50000 UNIT) PO CAPS
50000.0000 [IU] | ORAL_CAPSULE | ORAL | Status: DC
  Administered 2022-01-30: 50000 [IU] via ORAL
  Filled 2022-01-29 (×2): qty 1

## 2022-01-29 NOTE — Anesthesia Postprocedure Evaluation (Signed)
Anesthesia Post Note  Patient: KRISTIANA JACKO  Procedure(s) Performed: OPEN REDUCTION INTERNAL FIXATION (ORIF) TIBIAL PLATEAU (Left)     Patient location during evaluation: PACU Anesthesia Type: General Level of consciousness: awake and alert Pain management: pain level controlled Vital Signs Assessment: post-procedure vital signs reviewed and stable Respiratory status: spontaneous breathing, nonlabored ventilation, respiratory function stable and patient connected to nasal cannula oxygen Cardiovascular status: blood pressure returned to baseline and stable Postop Assessment: no apparent nausea or vomiting Anesthetic complications: no   No notable events documented.  Last Vitals:  Vitals:   01/28/22 2200 01/29/22 0309  BP:  101/67  Pulse: 94 96  Resp: 19 15  Temp:  36.9 C  SpO2: 92% 97%    Last Pain:  Vitals:   01/29/22 0548  TempSrc:   PainSc: 10-Worst pain ever                 Tykisha Areola L Allyah Heather

## 2022-01-29 NOTE — Progress Notes (Addendum)
Orthopaedic Trauma Progress Note  SUBJECTIVE: Patient had a lot of pain in the left leg following surgery yesterday.  Dressing was removed by on-call provider.  No issues with the incision since that time.  Patient sitting up in bed comfortably and eating her breakfast this morning upon me entering the room.  Notes that her pain is currently 10 out of 10.  Per chart review looks like she received her pain medication about 1 hour ago.  We discussed when she can receive these additional medications. States she cannot tolerate Bledsoe brace. No chest pain. No SOB. No nausea/vomiting. No other complaints.   OBJECTIVE:  Vitals:   01/29/22 0309 01/29/22 0822  BP: 101/67 94/67  Pulse: 96 99  Resp: 15 18  Temp: 98.5 F (36.9 C) 97.9 F (36.6 C)  SpO2: 97% 93%    General: Sitting up in bed eating breakfast, no acute distress Respiratory: No increased work of breathing.  Left lower extremity: All dressings have been removed.  Steri-Strips are in place, these remain clean, dry, intact.  No drainage from incisions.  Endorses sensation throughout extremity.  Compartments soft and compressible.  Tolerates some gentle ankle range of motion, endorse some discomfort with dorsiflexion.  Able to wiggle the toes.  Foot warm and well-perfused.  IMAGING: Stable post op imaging.   LABS:  Results for orders placed or performed during the hospital encounter of 01/27/22 (from the past 24 hour(s))  Basic metabolic panel     Status: Abnormal   Collection Time: 01/29/22  1:57 AM  Result Value Ref Range   Sodium 140 135 - 145 mmol/L   Potassium 4.4 3.5 - 5.1 mmol/L   Chloride 105 98 - 111 mmol/L   CO2 24 22 - 32 mmol/L   Glucose, Bld 135 (H) 70 - 99 mg/dL   BUN 9 6 - 20 mg/dL   Creatinine, Ser 1.61 0.44 - 1.00 mg/dL   Calcium 8.8 (L) 8.9 - 10.3 mg/dL   GFR, Estimated >09 >60 mL/min   Anion gap 11 5 - 15  CBC     Status: Abnormal   Collection Time: 01/29/22  1:57 AM  Result Value Ref Range   WBC 11.9 (H)  4.0 - 10.5 K/uL   RBC 3.50 (L) 3.87 - 5.11 MIL/uL   Hemoglobin 10.6 (L) 12.0 - 15.0 g/dL   HCT 45.4 (L) 09.8 - 11.9 %   MCV 91.4 80.0 - 100.0 fL   MCH 30.3 26.0 - 34.0 pg   MCHC 33.1 30.0 - 36.0 g/dL   RDW 14.7 82.9 - 56.2 %   Platelets 225 150 - 400 K/uL   nRBC 0.0 0.0 - 0.2 %    ASSESSMENT: Kerry Young is a 56 y.o. female, 1 Day Post-Op s/p ORIF TIBIAL PLATEAU  CV/Blood loss: Acute blood loss anemia, Hgb 10.6 this morning. Hemodynamically stable  PLAN: Weightbearing: NWB LLE ROM: Okay for unrestricted knee range of motion in Bledsoe brace Incisional and dressing care: Change as needed Showering: Okay to begin showering 01/31/2022.  Incisions may get wet at this time Orthopedic device(s): Bledsoe brace at all times.  Okay to leave this in the unlocked position Pain management:  1. Tylenol 1000 mg q 6 hours scheduled 2. Robaxin 500 mg q 6 hours PRN 3. Oxycodone 5-15 mg q 4 hours PRN 4. Dilaudid 0.5-1 mg q 3 hours PRN 5. Toradol 15 mg every 6 hours x4 doses 6. Tramadol 50 mg every 6 hours VTE prophylaxis: Lovenox, SCDs ID:  Ancef 2gm post op Foley/Lines:  No foley, KVO IVFs Impediments to Fracture Healing: Vitamin D level 7, will start on D2 supplementation  Dispo: PT/OT evaluation today, dispo pending.  Patient adamantly refuses SNF. Will plan for patient to d/c home on Saturday.   D/C recommendations: -Oxycodone and Robaxin for pain control -Eliquis 2.5 mg twice daily x30 days for DVT prophylaxis -Continue Vit D supplementation x 8 weeks  Follow - up plan: 2 weeks after discharge for wound check and repeat x-rays   Contact information:  Truitt Merle MD, Thyra Breed PA-C. After hours and holidays please check [email or web address] for group call information for [facility]   Thompson Caul, PA-C [phone] (office) [email or web address]

## 2022-01-29 NOTE — Progress Notes (Signed)
Laquinta rang her callbell for assistance with using the bathroom. I walked in to assist and she said "do you have anyone else you can send in?" I advised her we are in shift change, so that may be 10-15 minutes. She was agreeable. I left the room.

## 2022-01-29 NOTE — Progress Notes (Signed)
Pt medicated per MD order, pt takes pain meds is very thankful moments later screaming for pain meds, has no knowledge of time, was calling 911 now calling charge nurse, report given to charge nurse pt care transferred
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
## 2022-01-29 NOTE — Progress Notes (Signed)
After consulting with PA-Sarah and DD Darl Pikes. The decision was for me to continue providing care for this patient until end of my shift 7pm.   Patient is very upset that we cannot accommodate her request to go outside to smoke. We are using PRNs to keep patient cooperative and in the least amount of pain possible, dimming lights, providing reassurance, supportive listening. Patient seems inconsolable at this time.

## 2022-01-29 NOTE — TOC Initial Note (Addendum)
Transition of Care [facility]) - Initial/Assessment Note    Patient Details  Name: Kerry Young MRN: [id number] Date of Birth: 1966/05/15  [facility] CM/SW Contact:    Kerry Lesches, RN Phone Number: 01/29/2022, 11:19 AM  Clinical Narrative:                   -s/p ORIF L TIBIA From home with boyfriend Kerry Young. States independent with ADL's PTA. States already has RW, W/C and cane @ home. States once d/c she recover @ mom's place of  residence, mom to assist with care. Mom's address: [address] , [address], [id number]. Pt states she will arrange transportation to [facility] with [facility]).  Agreeable to home health services. Pt without provider preference.Referral made with [facility] and accepted.  TOC team following and will assist with needs....  01/29/2022 Pt unable to setup transportation to home .Pt will need non emergent ambulance transportation/PTAR to get  home. Mom's address: [address] , [address], [id number]. Per provider patient will probably d/c on Sat.  Expected Discharge Plan: Home w Home Health Services Barriers to Discharge: Continued Medical Work up   Patient Goals and CMS Choice        Expected Discharge Plan and Services Expected Discharge Plan: Home w Home Health Services   Discharge Planning Services: CM Consult   Living arrangements for the past 2 months: Single Family Home                                      Prior Living Arrangements/Services Living arrangements for the past 2 months: Single Family Home Lives with:: Significant Other Patient language and need for interpreter reviewed:: Yes Do you feel safe going back to the place where you live?: Yes      Need for Family Participation in Patient Care: Yes (Comment) Care giver support system in place?: Yes (comment) Current home services: DME (W/C, RW, and cane) Criminal Activity/Legal Involvement Pertinent to Current Situation/Hospitalization: No - Comment  as needed  Activities of Daily Living      Permission Sought/Granted      Share Information with NAME: Kerry Young(sgo) [phone]           Emotional Assessment Appearance:: Appears stated age Attitude/Demeanor/Rapport: Engaged Affect (typically observed): Accepting Orientation: : Oriented to Situation, Oriented to  Time, Oriented to Place, Oriented to Self Alcohol / Substance Use: Not Applicable Psych Involvement: No (comment)  Admission diagnosis:  Fall, initial encounter [W19.XXXA] Closed fracture of left tibial plateau [S82.142A] Other closed fracture of proximal end of left tibia, initial encounter [S82.192A] Patient Active Problem List   Diagnosis Date Noted   Closed fracture of left tibial plateau 01/27/2022   PTSD (post-traumatic stress disorder) 03/17/2021   Proximal humerus fracture 02/04/2021   Generalized anxiety disorder 07/08/2020   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 [place] [address] Phone: [phone] Fax: [phone]     Social Determinants of Health (SDOH) Interventions    Readmission Risk Interventions    02/21/2021    2:20 PM  Readmission Risk Prevention Plan  Post Dischage Appt Not Complete  Appt Comments DC to SNF  Medication Screening Complete  Transportation Screening Complete

## 2022-01-29 NOTE — TOC CAGE-AID Note (Signed)
Transition of Care [facility]) - CAGE-AID Screening   Patient Details  Name: Kerry Young MRN: [id number] Date of Birth: 1966-06-08  Transition of Care [facility]) CM/SW Contact:    Janora Norlander, RN Phone Number: [phone] 01/29/2022, 5:55 PM   Clinical Narrative: Pt here after sustaining a leg injury due to a fall.  Pt states that she does not drink alcohol and does not do drugs.  She does state that she smokes cigarettes but has no interest in quitting.   CAGE-AID Screening:    Have You Ever Felt You Ought to Cut Down on Your Drinking or Drug Use?: No Have People Annoyed You By Critizing Your Drinking Or Drug Use?: No Have You Felt Bad Or Guilty About Your Drinking Or Drug Use?: No Have You Ever Had a Drink or Used Drugs First Thing In The Morning to Steady Your Nerves or to Get Rid of a Hangover?: No CAGE-AID Score: 0  Substance Abuse Education Offered: No

## 2022-01-29 NOTE — Progress Notes (Signed)
Jaiya used her call bell to advise staff she needed to use the bathroom. When I walked into the room, Tyquisha told me that she wants to slide into a chair and then scoot to the restroom. I advised her that was an unsafe transfer plan that I would not be able to accommodate. Maria NT and I offered 3 options, bedpan, bsc, or ambulating to the bathroom.  She stated "I'm not peeing in that thing, I want my IV dilaudid" She does not currently have an IV due to dislodgement and is pending an IV attempt from IV Team.  Patient is now calling her boyfriend to take her home. She stated that she was incontinent of urine twice, but she refused for [facility] and I to clean her bed.   She is requesting to go smoke a cigarette. Thyra Breed, PA -Ortho has already declined that request.

## 2022-01-29 NOTE — Progress Notes (Signed)
Patient requested to speak with charge RN lauren. She requested that I not return to her room.

## 2022-01-29 NOTE — Evaluation (Signed)
Physical Therapy Evaluation Patient Details Name: Kerry Young MRN: [id number] DOB: [birth year] Today's Date: 01/29/2022  History of Present Illness  56 y.o. female admitted 7/11 for fall resulting in left tibial plateau fracture, underwent ORIF. PMH: Anxiety, Migraines, PONV,    PTSD  Sciatica, UTI  Clinical Impression  Patient is s/p above surgery resulting in functional limitations due to the deficits listed below (see PT Problem List). Patient transferred to recliner with 2+ mod assist, min assist with bed mobility. Very anxious with all mobility of LLE. Adamant about returning home and plans to stay with her mother. Has a transportation service she already uses "[employer]?" States company requires a  48 hour notice to have them transport her home. Likely to require some physical assistance from family at discharge for transfers but will continue to progress her independence during admission. Patient will benefit from skilled PT to increase their independence and safety with mobility to allow discharge to the venue listed below.          Recommendations for follow up therapy are one component of a multi-disciplinary discharge planning process, led by the attending physician.  Recommendations may be updated based on patient status, additional functional criteria and insurance authorization.  Follow Up Recommendations Home health PT (Refuses SNF, will need to be able to safely transfer into w/c; will need her transportation service to go home "[employer]")      Assistance Recommended at Discharge Intermittent Supervision/Assistance  Patient can return home with the following  A lot of help with walking and/or transfers;A lot of help with bathing/dressing/bathroom;Assistance with cooking/housework;Direct supervision/assist for medications management;Direct supervision/assist for financial management;Assist for transportation;Help with stairs or ramp for entrance    Equipment Recommendations None  recommended by PT  Recommendations for Other Services       Functional Status Assessment Patient has had a recent decline in their functional status and demonstrates the ability to make significant improvements in function in a reasonable and predictable amount of time.     Precautions / Restrictions Precautions Precautions: Fall Required Braces or Orthoses: Other Brace (Bledsoe brace) Other Brace: okay to take off for shower. Restrictions Weight Bearing Restrictions: Yes LLE Weight Bearing: Non weight bearing      Mobility  Bed Mobility Overal bed mobility: Needs Assistance Bed Mobility: Supine to Sit     Supine to sit: Min assist, HOB elevated     General bed mobility comments: Min assist for LLE support, pt moves quickly to avoid pain and sits EOB. Cues for safety and technique.    Transfers Overall transfer level: Needs assistance Equipment used: Rolling walker (2 wheels) Transfers: Sit to/from Stand, Bed to chair/wheelchair/BSC Sit to Stand: Mod assist, +2 physical assistance, +2 safety/equipment Stand pivot transfers: Mod assist, +2 physical assistance, +2 safety/equipment         General transfer comment: Mod assist +2 for boost to stand and to manage lines/leads. Pt anxious and yelling but physically able to support herself with RLE and UEs on RW. Max VC for safety, pt pushing RW away from BOS. Encouraged close proximity to maintain UE support with pivot. Some physical assist to maintain NWB through LLE. Pt rushes to sit.    Ambulation/Gait                  Stairs            Wheelchair Mobility    Modified Rankin (Stroke Patients Only)       Balance Overall balance assessment: Needs  assistance Sitting-balance support: No upper extremity supported, Feet supported Sitting balance-Leahy Scale: Good     Standing balance support: Bilateral upper extremity supported, Reliant on assistive device for balance Standing balance-Leahy Scale: Poor                                Pertinent Vitals/Pain Pain Assessment Pain Assessment: 0-10 Pain Score: 10-Worst pain ever Pain Location: LLE Pain Descriptors / Indicators: Grimacing, Guarding, Other (Comment) (Screaming out at times) Pain Intervention(s): Limited activity within patient's tolerance, Monitored during session, Premedicated before session, Repositioned    Home Living Family/patient expects to be discharged to:: Private residence (Going to stay with her mother.) Living Arrangements: Spouse/significant other;Children Available Help at Discharge: Family;Available 24 hours/day ((mother, boyfriend, boyfriend's son)) Type of Home: House ("Mother's house" where she plans to stay) Home Access: Stairs to enter Entrance Stairs-Rails: None Entrance Stairs-Number of Steps: 2   Home Layout: One level Home Equipment: Agricultural consultant (2 wheels);Rollator (4 wheels);Wheelchair - manual Additional Comments: States she plans to stay with mother at d/c    Prior Function Prior Level of Function : Needs assist             Mobility Comments: States she could mobilize ind however has a transportation company take her to medical appointments.       Hand Dominance   Dominant Hand: Right    Extremity/Trunk Assessment   Upper Extremity Assessment Upper Extremity Assessment: Defer to OT evaluation    Lower Extremity Assessment Lower Extremity Assessment: LLE deficits/detail LLE Deficits / Details: Limited assessment. light touch in tact, moving ankle voluntarily but limited. LLE: Unable to fully assess due to pain LLE Sensation: WNL       Communication   Communication: No difficulties  Cognition Arousal/Alertness: Awake/alert Behavior During Therapy: Anxious Overall Cognitive Status: Within Functional Limits for tasks assessed                                 General Comments: Incredibly anxious.        General Comments General comments (skin  integrity, edema, etc.): Wore bledsoe brace for transfer. Refusing bledsoe after. Agreeable to KI. Explained importance of wearing for protection and optimal outcomes during healing process. Still refuses.    Exercises General Exercises - Lower Extremity Ankle Circles/Pumps: AROM, Both, 5 reps, Seated Quad Sets: Strengthening, Both, 5 reps, Supine   Assessment/Plan    PT Assessment Patient needs continued PT services  PT Problem List Decreased strength;Decreased range of motion;Decreased balance;Decreased activity tolerance;Decreased mobility;Decreased knowledge of use of DME;Decreased safety awareness;Decreased knowledge of precautions;Obesity;Pain       PT Treatment Interventions DME instruction;Gait training;Functional mobility training;Therapeutic exercise;Therapeutic activities;Balance training;Neuromuscular re-education;Patient/family education    PT Goals (Current goals can be found in the Care Plan section)  Acute Rehab PT Goals Patient Stated Goal: Go home PT Goal Formulation: With patient Time For Goal Achievement: 02/05/22 Potential to Achieve Goals: Fair    Frequency Min 5X/week     Co-evaluation PT/OT/SLP Co-Evaluation/Treatment: Yes Reason for Co-Treatment: Complexity of the patient's impairments (multi-system involvement);For patient/therapist safety;To address functional/ADL transfers PT goals addressed during session: Mobility/safety with mobility;Balance;Proper use of DME;Strengthening/ROM         AM-PAC PT "6 Clicks" Mobility  Outcome Measure Help needed turning from your back to your side while in a flat bed without using bedrails?: A Little Help needed  moving from lying on your back to sitting on the side of a flat bed without using bedrails?: A Little Help needed moving to and from a bed to a chair (including a wheelchair)?: A Lot Help needed standing up from a chair using your arms (e.g., wheelchair or bedside chair)?: A Lot Help needed to walk in  hospital room?: Total Help needed climbing 3-5 steps with a railing? : Total 6 Click Score: 12    End of Session Equipment Utilized During Treatment: Gait belt;Other (comment) (Bledsoe brace LLE) Activity Tolerance: Patient limited by pain;Other (comment) (Anxiety) Patient left: in chair;with call bell/phone within reach;with chair alarm set;with SCD's reapplied   PT Visit Diagnosis: Repeated falls (R29.6);Muscle weakness (generalized) (M62.81);History of falling (Z91.81);Difficulty in walking, not elsewhere classified (R26.2);Pain Pain - Right/Left: Left Pain - part of body: Leg    Time: 6811-5726 PT Time Calculation (min) (ACUTE ONLY): 40 min   Charges:   PT Evaluation $PT Eval Moderate Complexity: 1 Mod PT Treatments $Therapeutic Activity: 8-22 mins        Kathlyn Sacramento, PT   Berton Mount 01/29/2022, 10:37 AM

## 2022-01-30 ENCOUNTER — Other Ambulatory Visit (HOSPITAL_COMMUNITY): Payer: Self-pay

## 2022-01-30 LAB — CBC
HCT: 29.8 % — ABNORMAL LOW (ref 36.0–46.0)
Hemoglobin: 9.9 g/dL — ABNORMAL LOW (ref 12.0–15.0)
MCH: 30.6 pg (ref 26.0–34.0)
MCHC: 33.2 g/dL (ref 30.0–36.0)
MCV: 92 fL (ref 80.0–100.0)
Platelets: 203 10*3/uL (ref 150–400)
RBC: 3.24 MIL/uL — ABNORMAL LOW (ref 3.87–5.11)
RDW: 13.7 % (ref 11.5–15.5)
WBC: 10.6 10*3/uL — ABNORMAL HIGH (ref 4.0–10.5)
nRBC: 0 % (ref 0.0–0.2)

## 2022-01-30 NOTE — Progress Notes (Signed)
Physical Therapy Treatment Patient Details Name: Kerry Young MRN: [id number] DOB: 05-Apr-1966 Today's Date: 01/30/2022   History of Present Illness 56 y.o. female admitted 7/11 for fall resulting in left tibial plateau fracture, underwent ORIF. PMH: Anxiety, Migraines, PONV,    PTSD  Sciatica, UTI    PT Comments    Improved bed mobility today. Allowed donning of Bledsoe - instructed on surgeons orders. Unable to stand pivot transfer today. Min assist for LLE back into bed. Extensive discussion regarding d/c needs. Since she is planning to return home (refuses SNF) we discussed importance of frequent mobility OOB to avoid secondary complications that may arise from immobility. Also recommended trying sliding board at next PT session since I am unsure if family will be able to safely assist her with a stand/squat pivot transfer due to difficulty today. Patient will continue to benefit from skilled physical therapy services to further improve independence with functional mobility.   Recommendations for follow up therapy are one component of a multi-disciplinary discharge planning process, led by the attending physician.  Recommendations may be updated based on patient status, additional functional criteria and insurance authorization.  Follow Up Recommendations  Home health PT (Still refuses SNF - has a transportation service she uses but requires 48 hr notice.)     Assistance Recommended at Discharge Intermittent Supervision/Assistance  Patient can return home with the following A lot of help with bathing/dressing/bathroom;Assistance with cooking/housework;Direct supervision/assist for medications management;Direct supervision/assist for financial management;Assist for transportation;Help with stairs or ramp for entrance;Two people to help with walking and/or transfers   Equipment Recommendations  Other (comment) (Sliding Board)    Recommendations for Other Services       Precautions /  Restrictions Precautions Precautions: Fall Required Braces or Orthoses: Other Brace Other Brace: okay to take off for shower. Restrictions Weight Bearing Restrictions: Yes LLE Weight Bearing: Non weight bearing     Mobility  Bed Mobility Overal bed mobility: Needs Assistance Bed Mobility: Supine to Sit, Sit to Supine     Supine to sit: HOB elevated, Min guard Sit to supine: Min assist   General bed mobility comments: Min guard to EOB. Min assist for LLE support back into bed - Bledsoe brace on.    Transfers Overall transfer level: Needs assistance Equipment used: Rolling walker (2 wheels) Transfers: Sit to/from Stand Sit to Stand: Total assist, From elevated surface           General transfer comment: Pt unable to stand upright fully from slightly elevated bed surface after several attempts. Scooting forward insted of pushing up. Max VC for technique. Support for LLE to prevent WB through LLE.    Ambulation/Gait                   Stairs             Wheelchair Mobility    Modified Rankin (Stroke Patients Only)       Balance Overall balance assessment: Needs assistance Sitting-balance support: No upper extremity supported, Feet supported Sitting balance-Leahy Scale: Good                                      Cognition Arousal/Alertness: Awake/alert Behavior During Therapy: Anxious Overall Cognitive Status: Within Functional Limits for tasks assessed  General Comments: Incredibly anxious.        Exercises      General Comments General comments (skin integrity, edema, etc.): Agreeable to therapy. Unable to transfer. Discussed alternative techniques since unlikely family will be able to assist to degree required for stand pivot. Possible sliding board technique.      Pertinent Vitals/Pain Pain Assessment Pain Assessment: 0-10 Pain Score: 10-Worst pain ever Pain Location:  LLE Pain Descriptors / Indicators: Grimacing, Guarding, Other (Comment) Pain Intervention(s): Limited activity within patient's tolerance, Monitored during session, Repositioned, Patient requesting pain meds-RN notified, RN gave pain meds during session    Home Living                          Prior Function            PT Goals (current goals can now be found in the care plan section) Acute Rehab PT Goals Patient Stated Goal: Go home PT Goal Formulation: With patient Time For Goal Achievement: 02/05/22 Potential to Achieve Goals: Fair Progress towards PT goals: Progressing toward goals    Frequency    Min 5X/week      PT Plan Equipment recommendations need to be updated    Co-evaluation              AM-PAC PT "6 Clicks" Mobility   Outcome Measure  Help needed turning from your back to your side while in a flat bed without using bedrails?: None Help needed moving from lying on your back to sitting on the side of a flat bed without using bedrails?: A Little Help needed moving to and from a bed to a chair (including a wheelchair)?: A Lot Help needed standing up from a chair using your arms (e.g., wheelchair or bedside chair)?: Total Help needed to walk in hospital room?: Total Help needed climbing 3-5 steps with a railing? : Total 6 Click Score: 12    End of Session Equipment Utilized During Treatment: Gait belt;Other (comment) (Bledsoe brace LLE) Activity Tolerance: Patient limited by pain;Other (comment) (Anxiety) Patient left: with call bell/phone within reach;with SCD's reapplied;in bed;with bed alarm set Nurse Communication: Patient requests pain meds PT Visit Diagnosis: Repeated falls (R29.6);Muscle weakness (generalized) (M62.81);History of falling (Z91.81);Difficulty in walking, not elsewhere classified (R26.2);Pain Pain - Right/Left: Left Pain - part of body: Leg     Time: 1533-1601 PT Time Calculation (min) (ACUTE ONLY): 28 min  Charges:   $Therapeutic Activity: 8-22 mins                     Kathlyn Sacramento, PT    Berton Mount 01/30/2022, 4:14 PM

## 2022-01-30 NOTE — Progress Notes (Signed)
Orthopaedic Trauma Progress Note  SUBJECTIVE: Still having a lot of pain. Not ready to DC today she states. Otherwise sleeping and appeared comfortable prior to waking her up  OBJECTIVE:  Vitals:   01/29/22 2022 01/30/22 0755  BP: 110/74 (!) 100/59  Pulse: 92 96  Resp: 18 18  Temp: 97.9 F (36.6 C) 97.9 F (36.6 C)  SpO2: 94% 92%    General: Sleeping, awakens easily and answers questions Respiratory: No increased work of breathing.  Left lower extremity: All dressings have been removed.  Steri-Strips are in place, these remain clean, dry, intact.  No drainage from incisions.  Endorses sensation throughout extremity.  Compartments soft and compressible.  Tolerates some gentle ankle range of motion, endorse some discomfort with dorsiflexion.  Able to wiggle the toes.  Foot warm and well-perfused.  IMAGING: Stable post op imaging.   LABS:  Results for orders placed or performed during the hospital encounter of 01/27/22 (from the past 24 hour(s))  CBC     Status: Abnormal   Collection Time: 01/30/22  2:30 AM  Result Value Ref Range   WBC 10.6 (H) 4.0 - 10.5 K/uL   RBC 3.24 (L) 3.87 - 5.11 MIL/uL   Hemoglobin 9.9 (L) 12.0 - 15.0 g/dL   HCT 48.1 (L) 85.6 - 31.4 %   MCV 92.0 80.0 - 100.0 fL   MCH 30.6 26.0 - 34.0 pg   MCHC 33.2 30.0 - 36.0 g/dL   RDW 97.0 26.3 - 78.5 %   Platelets 203 150 - 400 K/uL   nRBC 0.0 0.0 - 0.2 %    ASSESSMENT: Kerry Young is a 56 y.o. female, 2 Days Post-Op s/p ORIF TIBIAL PLATEAU  CV/Blood loss: Acute blood loss anemia, Hgb 9.9 this morning. Hemodynamically stable  PLAN: Weightbearing: NWB LLE ROM: Okay for unrestricted knee range of motion in Bledsoe brace Incisional and dressing care: Change as needed Showering: Okay to begin showering 01/31/2022.  Incisions may get wet at this time Orthopedic device(s): Bledsoe brace at all times.  Okay to leave this in the unlocked position Pain management:  1. Tylenol 1000 mg q 6 hours scheduled 2. Robaxin  500 mg q 6 hours PRN 3. Oxycodone 5-15 mg q 4 hours PRN 4. Dilaudid 0.5-1 mg q 3 hours PRN 5. Toradol 15 mg every 6 hours x4 doses 6. Tramadol 50 mg every 6 hours VTE prophylaxis: Lovenox, SCDs ID:  Ancef 2gm post op Foley/Lines:  No foley, KVO IVFs Impediments to Fracture Healing: Vitamin D level 7, will start on D2 supplementation  Dispo: PT/OT evaluation today, dispo pending.  Patient adamantly refuses SNF. Will plan for patient to d/c home on Saturday.   D/C recommendations: -Oxycodone and Robaxin for pain control -Eliquis 2.5 mg twice daily x30 days for DVT prophylaxis -Continue Vit D supplementation x 8 weeks  Follow - up plan: 2 weeks after discharge for wound check and repeat x-rays   Contact information:  Truitt Merle MD, Thyra Breed PA-C. After hours and holidays please check [email or web address] for group call information for [facility]  Roby Lofts, MD [facility] [phone] (office) [email or web address]

## 2022-01-31 NOTE — Progress Notes (Addendum)
PT Cancellation Note  Patient Details Name: Kerry Young MRN: [id number] DOB: 25-Nov-1965   Cancelled Treatment:    Reason Eval/Treat Not Completed: Patient declined, no reason specified. Pt sleeping upon arrival and awakened by therapist. Pt stating that it is not a good time to participate due to left foot numbness and tingling. Pt also reported concern of falling if she was to stand. Pt encouraged on transfer with slide board but still unwilling. Will re-attempt at later time if pt not discharged. UPDATE: pt refused again in afternoon stating she was too tired.    Tana Coast 01/31/2022, 10:47 AM

## 2022-01-31 NOTE — Discharge Summary (Signed)
Patient ID: Kerry Young MRN: [id number] DOB/AGE: [birth year] 56 y.o.  Admit date: 01/27/2022 Discharge date: 01/31/2022  Admission Diagnoses: left tibial plateau fracture  Discharge Diagnoses:  Principal Problem:   Closed fracture of left tibial plateau   Past Medical History:  Diagnosis Date   Anxiety    Migraines    PONV (postoperative nausea and vomiting)    PTSD (post-traumatic stress disorder)    Sciatica    UTI (urinary tract infection)      Procedures Performed:  Open reduction internal fixation of left bicondylar tibial plateau fracture Repair of left lateral mensicus tear  Discharged Condition: stable  Hospital Course:  Kerry Young is a 56 y.o. female who was brought to  [facility] with complaints of left knee pain after falling down the stairs at home on 01/27/22. She landed on her left leg and had immediate pain and inability to weight bear on the left leg. Imaging showed acute comminuted displaced and impacted intra-articular fracture involving proximal left tibia with lipohemarthrosis. Orthopedics was consulted. Dr. Eulah Pont performed a closed manipulation of her bicondylar tibial plateau fracture as well as long-leg splinting. Post reduction images showed improved alignment in acceptable position.This case was discussed with Dr. [name] who took the patient to the OR on 01/28/22. He performed ORIF of left tibial plateau fracture and lateral meniscus repair. She  tolerated procedure well. She was kept in the hospital for pain control, medical monitoring postop, PT/OT evaluations, IV antibiotics, VTE prophylaxis, and discharge planning. It was recommended that the patient be discharged to SNF, however patient adamantly refused SNF. She felt comfortable with discharge home with her family. She required 48 hours notice prior to discharge which was provided to her. Please see multiple progress notes for patient's behavior during hospital stay which was complicated by  unmanageable pain in a chronic pain patient. She was found to be stable for DC home on 01/31/22.  Patient was instructed on specific activity restrictions and all questions were answered.  Consults: PT/OT  Significant Diagnostic Studies: No additional pertinent studies  Treatments: Surgery  Discharge Exam: General: Sleeping, awakens easily and answers questions Respiratory: No increased work of breathing.  Left lower extremity: All dressings have been removed.  Steri-Strips are in place, these remain clean, dry, intact.  No drainage from incisions.  Endorses sensation throughout extremity.  Compartments soft and compressible.  Tolerates some gentle ankle range of motion, endorse some discomfort with dorsiflexion.  Able to wiggle the toes.  Foot warm and well-perfused.    Disposition: Discharge disposition: 01-Home or Self Care        Allergies as of 01/31/2022       Reactions   Amitriptyline Other (See Comments)   Cold sores on mouth, blister on tongue. No facial swelling    Celexa [citalopram] Nausea And Vomiting   Took it 2 days. Had n/v and migraine   Naproxen Other (See Comments)   Citalopram Nausea And Vomiting   Naproxen Other (See Comments)   UNK Reaction   Ondansetron Hcl Other (See Comments)   Headache Headache   Other Nausea And Vomiting   ALL ANTIBIOTICS   Zofran [ondansetron] Other (See Comments)        Medication List     TAKE these medications    acetaminophen 500 MG tablet Commonly known as: TYLENOL Take 2 tablets (1,000 mg total) by mouth every 8 (eight) hours as needed for mild pain.   ALPRAZolam 1 MG tablet Commonly known as: Xanax  Take 1 tablet (1 mg total) by mouth 3 (three) times daily as needed for anxiety.   diphenhydrAMINE 25 MG tablet Commonly known as: BENADRYL Take 25 mg by mouth 3 (three) times daily as needed for allergies.   docusate sodium 100 MG capsule Commonly known as: COLACE Take 1 capsule (100 mg total) by mouth 2 (two)  times daily as needed for mild constipation.   Eliquis 2.5 MG Tabs tablet Generic drug: apixaban Take 1 tablet (2.5 mg total) by mouth 2 (two) times daily.   methocarbamol 500 MG tablet Commonly known as: ROBAXIN Take 1 tablet (500 mg total) by mouth every 6 (six) hours as needed for muscle spasms.   Oxycodone HCl 10 MG Tabs Take 0.5-1 tablets (5-10 mg total) by mouth every 4 (four) hours as needed for severe pain or moderate pain (5 moderate pain, 10 mg severe pain).   pregabalin 300 MG capsule Commonly known as: LYRICA Take 300 mg by mouth 2 (two) times daily.   QUEtiapine Fumarate 150 MG 24 hr tablet Commonly known as: SEROQUEL XR Take 1 tablet (150 mg total) by mouth at bedtime.   Ubrelvy 100 MG Tabs Generic drug: Ubrogepant Take 1 tablet by mouth daily as needed for migraine.   Vitamin D (Ergocalciferol) 1.25 MG (50000 UNIT) Caps capsule Commonly known as: DRISDOL Take 1 capsule (50,000 Units total) by mouth every 7 (seven) days.        Follow-up Information     Haddix, Gillie Manners, MD. Schedule an appointment as soon as possible for a visit in 2 week(s).   Specialty: Orthopedic Surgery Why: for wound check and repeat x-rays Contact information: [address] [place] [address] [phone]         Care, [facility] Follow up.   Specialty: Home Health Services Why: home health services will be provided by [facility] Contact information: [address] [place] [address] [phone]

## 2022-01-31 NOTE — TOC Transition Note (Addendum)
Transition of Care [facility]) - CM/SW Discharge Note   Patient Details  Name: Kerry Young MRN: [id number] Date of Birth: 01-23-1966  Transition of [facility]) CM/SW Contact:  Bess Kinds, RN Phone Number: [phone] 01/31/2022, 9:04 AM   Clinical Narrative:     Patient to transition from hospital today. Spoke with patient on hospital room phone to confirm transportation need and destination address. [employer] notified at [phone] to arrange discharge transportation. Provided pickup time 30 minutes to 3 hours. Trip# [id number]. Medical transport paperwork completed.   UPDATE 1530: notified by nursing that patient had received notice of transport cancellation. Contacted [facility] for [facility] who confirmed that trip was canceled d/t "lack of information." [employer] [phone] stated attempts to contact nurses station and patient unsuccessfully. Transport arranged again with trip# [id number] with anticipated pick up no later than 6:30pm.   UPDATE 1545: Received call back from [employer] that [name] will pick her up about 5pm.   Final next level of care: Home w Home Health Services Barriers to Discharge: No Barriers Identified   Patient Goals and CMS Choice Patient states their goals for this hospitalization and ongoing recovery are:: stay with her mom during recovery then home CMS Medicare.gov Compare Post Acute Care list provided to:: Patient Choice offered to / list presented to : Patient  Discharge Placement                       Discharge Plan and Services   Discharge Planning Services: CM Consult                      HH Arranged: PT [facility] Agency: [facility] Date [facility] Agency Contacted: 01/29/22 Time HH Agency Contacted: 1152 Representative spoke with at [facility] Agency: Kandee Keen  Social Determinants of Health (SDOH) Interventions     Readmission Risk Interventions    02/21/2021    2:20 PM  Readmission Risk Prevention Plan  Post Dischage Appt Not Complete  Appt  Comments DC to SNF  Medication Screening Complete  Transportation Screening Complete

## 2022-02-03 ENCOUNTER — Encounter: Payer: Self-pay | Admitting: Physician Assistant

## 2022-02-03 ENCOUNTER — Ambulatory Visit (INDEPENDENT_AMBULATORY_CARE_PROVIDER_SITE_OTHER): Payer: Self-pay | Admitting: Physician Assistant

## 2022-02-03 DIAGNOSIS — F4323 Adjustment disorder with mixed anxiety and depressed mood: Secondary | ICD-10-CM

## 2022-02-03 DIAGNOSIS — F4310 Post-traumatic stress disorder, unspecified: Secondary | ICD-10-CM

## 2022-02-03 DIAGNOSIS — F411 Generalized anxiety disorder: Secondary | ICD-10-CM

## 2022-02-03 DIAGNOSIS — G4700 Insomnia, unspecified: Secondary | ICD-10-CM

## 2022-02-03 MED ORDER — BUSPIRONE HCL 5 MG PO TABS: 5.0000 mg | ORAL_TABLET | Freq: Two times a day (BID) | ORAL | 1 refills | Status: DC

## 2022-02-03 NOTE — Progress Notes (Signed)
[facility] Check  Patient ID: Kerry Young,  MRN: [id number]  PCP: Leonard Downing  Date of Evaluation: 02/03/2022 Time spent:30 minutes  Chief Complaint:  Chief Complaint   Anxiety; Follow-up    Virtual Visit via Telehealth  I connected with patient by telephone, with their informed consent, and verified patient privacy and that I am speaking with the correct person using two identifiers.  I am private, in my office and the patient is at home.  I discussed the limitations, risks, security and privacy concerns of performing an evaluation and management service by telephone and the availability of in person appointments. I also discussed with the patient that there may be a patient responsible charge related to this service. The patient expressed understanding and agreed to proceed.   I discussed the assessment and treatment plan with the patient. The patient was provided an opportunity to ask questions and all were answered. The patient agreed with the plan and demonstrated an understanding of the instructions.   The patient was advised to call back or seek an in-person evaluation if the symptoms worsen or if the condition fails to improve as anticipated.  I provided 30 minutes of non-face-to-face time during this encounter.  HISTORY/CURRENT STATUS: HPI not doing well. Her Mom is with her although not participating in the call.   States she is very anxious.  She did not restart the BuSpar last week as we had discussed.  She is afraid of it because it caused headaches.  She has had several headaches since going off of it though.  She fell down several steps at home not long after our appointment last week.  She fractured the left tibia, requiring surgery for that as well as meniscus repair.  She was in the hospital for 7 days.  Reports that injury has set her back with the anxiety and it is not controlled.  She is on oxycodone for the pain as well as a muscle relaxer.  Needs  help with the anxiety.  Last week she wanted to increase the Xanax which was denied.  Due to this recent injury she has not been able to get out and do anything.  She cries easily.  ADLs are difficult.  Personal hygiene is normal.  Appetite is normal and weight is able.  Not sleeping well right now mostly because of the pain.  No nightmares.  No suicidal or homicidal thoughts.  No increased energy with decreased need for sleep, no increased talkativeness, no racing thoughts, no impulsivity or risky behaviors, no increased spending, no increased libido, no grandiosity, no increased irritability or anger, no paranoia, and no hallucinations.  States that she was not dizzy, she just tripped on a step and fell.  Did not pass out and did not hit her head.  Denies tremor, tics, confusion, or dystonia.  Individual Medical History/ Review of Systems: Changes? :Yes    see HPI and records in epic.  Past medications for mental health diagnoses include: Zoloft, mirtazapine, Trintellix, Viibryd, Cymbalta, trazodone, Xanax, Paxil, Seroquel, Klonopin didn't work, Ambien without relief, Lunesta no improvement, Celexa caused n/v and H/A, Buspar  Allergies: Amitriptyline, Celexa [citalopram], Naproxen, Citalopram, Naproxen, Ondansetron hcl, Other, and Zofran [ondansetron]   Current Medications:  Current Outpatient Medications:    acetaminophen (TYLENOL) 500 MG tablet, Take 2 tablets (1,000 mg total) by mouth every 8 (eight) hours as needed for mild pain., Disp: , Rfl: 0   ALPRAZolam (XANAX) 1 MG tablet, Take 1 tablet (1  mg total) by mouth 3 (three) times daily as needed for anxiety., Disp: 90 tablet, Rfl: 0   apixaban (ELIQUIS) 2.5 MG TABS tablet, Take 1 tablet (2.5 mg total) by mouth 2 (two) times daily., Disp: 60 tablet, Rfl: 0   busPIRone (BUSPAR) 5 MG tablet, Take 1 tablet (5 mg total) by mouth 2 (two) times daily., Disp: 60 tablet, Rfl: 1   diphenhydrAMINE (BENADRYL) 25 MG tablet, Take 25 mg by mouth 3  (three) times daily as needed for allergies., Disp: , Rfl:    docusate sodium (COLACE) 100 MG capsule, Take 1 capsule (100 mg total) by mouth 2 (two) times daily as needed for mild constipation., Disp: 10 capsule, Rfl: 0   methocarbamol (ROBAXIN) 500 MG tablet, Take 1 tablet (500 mg total) by mouth every 6 (six) hours as needed for muscle spasms., Disp: 28 tablet, Rfl: 0   Oxycodone HCl 10 MG TABS, Take 0.5-1 tablets (5-10 mg total) by mouth every 4 (four) hours as needed for severe pain or moderate pain (5 moderate pain, 10 mg severe pain)., Disp: 42 tablet, Rfl: 0   pregabalin (LYRICA) 300 MG capsule, Take 300 mg by mouth 2 (two) times daily., Disp: , Rfl:    QUEtiapine Fumarate (SEROQUEL XR) 150 MG 24 hr tablet, Take 1 tablet (150 mg total) by mouth at bedtime., Disp: 30 tablet, Rfl: 2   UBRELVY 100 MG TABS, Take 1 tablet by mouth daily as needed for migraine., Disp: , Rfl:    Vitamin D, Ergocalciferol, (DRISDOL) 1.25 MG (50000 UNIT) CAPS capsule, Take 1 capsule (50,000 Units total) by mouth every 7 (seven) days., Disp: 8 capsule, Rfl: 0 Medication Side Effects: none  Family Medical/ Social History: Changes?  No  MENTAL HEALTH EXAM:  There were no vitals taken for this visit.There is no height or weight on file to calculate BMI.  General Appearance:  unable to assess  Eye Contact:   unable to assess  Speech:  Clear and Coherent, Garbled, Slow, Slurred, and there were 2-3 times when it seemed like she fell asleep on the phone, I spoke loudly to her and then she answered questions.  Volume:  Normal  Mood:  Anxious and Depressed  Affect:  Tearful and Unable to assess  Thought Process:  Goal Directed and Descriptions of Associations: Intact  Orientation:  Full (Time, Place, and Person)  Thought Content: Logical   Suicidal Thoughts:  No  Homicidal Thoughts:  No  Memory:  WNL  Judgement:  Good  Insight:  Good  Psychomotor Activity:   unable to assess  Concentration:  Concentration: Good  and Attention Span: Good  Recall:  Good  Fund of Knowledge: Good  Language: Good  Assets:  Desire for Improvement  ADL's:  Intact  Cognition: WNL  Prognosis:  Good   01/29/2022 CBC reviewed. Glucose 135  DIAGNOSES:    ICD-10-CM   1. Situational mixed anxiety and depressive disorder  F43.23     2. Generalized anxiety disorder  F41.1     3. PTSD (post-traumatic stress disorder)  F43.10     4. Insomnia, unspecified type  G47.00      Receiving Psychotherapy: No   RECOMMENDATIONS:  PDMP was reviewed. Last Xanax filled 01/23/2022. I provided 30 minutes of non-face-to-face time during this encounter, including time spent before and after the visit in records review, medical decision making, counseling pertinent to today's visit, and charting.   I am sorry to hear about her accident.  Her mom is there so  she is not alone.  Her speech is slurred, she is on several sedating medications including Xanax.  I discussed this with Dr. Jennelle Human who recommended either decreasing the Xanax by 0.5 mg every 4 days if needed, or changing to Klonopin 1 mg 3 times daily and taper at the same rate if we have to get her off the Xanax completely.  She will not be on opiates for ever so I have no concerns of getting her off Xanax completely unless that should change.  She was told that using all of these very sedating drugs at once can cause respiratory depression and even death so she needs to limit them as much as possible and try to avoid taking them within at least 4 hours of each other.  She already has a prescription for Xanax, filled on July 7, so instead of adding Klonopin and asking her to bring in the bottle of Xanax, I have asked her to decrease the dose to no more than 2.5 pills daily.  She verbalizes understanding. It is very important that she either restart the BuSpar, or I will add Zoloft or Paxil to help with the anxiety.  She prefers to start the BuSpar at a lower dose which is fine.  Decrease  Xanax 1 mg, by taking 1 po bid and 1/2 mid-day prn. No more than 2.5 pills daily.  Restart BuSpar 5 mg, 1 p.o. twice daily. Continue Seroquel to XR 150 mg, 1 po qhs (for PTSD, anxiety, and sleep.) Strongly recommend counseling. Return in 2 weeks.  Melony Overly, PA-C

## 2022-02-04 ENCOUNTER — Emergency Department (HOSPITAL_COMMUNITY): Payer: Medicaid Other

## 2022-02-04 ENCOUNTER — Other Ambulatory Visit: Payer: Self-pay

## 2022-02-04 ENCOUNTER — Encounter (HOSPITAL_COMMUNITY): Payer: Self-pay

## 2022-02-04 ENCOUNTER — Emergency Department (HOSPITAL_COMMUNITY)
Admission: EM | Admit: 2022-02-04 | Discharge: 2022-02-04 | Disposition: A | Discharge status: Home / Self Care | Payer: Medicaid Other | Attending: Emergency Medicine | Admitting: Emergency Medicine

## 2022-02-04 DIAGNOSIS — W1839XA Other fall on same level, initial encounter: Secondary | ICD-10-CM | POA: Insufficient documentation

## 2022-02-04 DIAGNOSIS — S8992XA Unspecified injury of left lower leg, initial encounter: Secondary | ICD-10-CM | POA: Diagnosis present

## 2022-02-04 DIAGNOSIS — S8012XA Contusion of left lower leg, initial encounter: Secondary | ICD-10-CM | POA: Insufficient documentation

## 2022-02-04 DIAGNOSIS — W19XXXA Unspecified fall, initial encounter: Secondary | ICD-10-CM

## 2022-02-04 DIAGNOSIS — M79605 Pain in left leg: Secondary | ICD-10-CM

## 2022-02-04 MED ORDER — BACITRACIN ZINC 500 UNIT/GM EX OINT
TOPICAL_OINTMENT | CUTANEOUS | Status: AC
  Filled 2022-02-04: qty 3.6

## 2022-02-04 MED ORDER — OXYCODONE HCL 5 MG PO TABS
10.0000 mg | ORAL_TABLET | Freq: Once | ORAL | Status: AC
  Administered 2022-02-04: 10 mg via ORAL
  Filled 2022-02-04: qty 2

## 2022-02-04 MED ORDER — SODIUM CHLORIDE 0.9 % IV BOLUS
500.0000 mL | Freq: Once | INTRAVENOUS | Status: AC
  Administered 2022-02-04: 500 mL via INTRAVENOUS

## 2022-02-04 NOTE — ED Triage Notes (Signed)
PER EMS: pt had surgery last Monday for an open tib/fib fracture. 2 days ago her stitches came out. She reports increased bleeding due to taking Eliquis. She reports pain to that left leg and appears very anxious. Did not take her Xanax.   BP- 130/80, HR-98, RR20, 93% RA

## 2022-02-04 NOTE — ED Notes (Signed)
Patient transported to X-ray 

## 2022-02-04 NOTE — Discharge Instructions (Signed)
Your x-rays on today's visit did not show any acute findings.  You will need to schedule an appointment with your orthopedist for further follow-up on your surgery.  Continue taking your pain medication at home with your prescriptions provided by pain management.

## 2022-02-04 NOTE — ED Provider Notes (Signed)
[facility]EMERGENCY DEPT Provider Note   CSN: [id number] Arrival date & time: 02/04/22  1502     History  Chief Complaint  Patient presents with   Leg Pain    Kerry Young is a 56 y.o. female.  56 year old female with a past medical history of anxiety, PTSD, presents to the ED via EMS with a chief complaint of left leg pain.  Patient had surgical intervention approximately 1 week ago by Dr. [name] after a mechanical fall.  Patient is here due to a second fall while attempting to go down the steps.  Reports most of the fall was braced by her left knee.  She has not been able to weight-bear since this occurred.  She is unsure whether she struck her head.  There was no loss of consciousness.  She is currently taking oxycodone 10 mg for pain control, last taken approximately at 10 AM.  The history is provided by the patient and medical records.  Leg Pain Location:  Leg and knee Associated symptoms: no fever        Home Medications Prior to Admission medications   Medication Sig Start Date End Date Taking? Authorizing Provider  acetaminophen (TYLENOL) 500 MG tablet Take 2 tablets (1,000 mg total) by mouth every 8 (eight) hours as needed for mild pain. 01/29/22   [place] Bali, PA-C  ALPRAZolam Prudy Feeler) 1 MG tablet Take 1 tablet (1 mg total) by mouth 3 (three) times daily as needed for anxiety. 01/25/22   Melony Overly T, PA-C  apixaban (ELIQUIS) 2.5 MG TABS tablet Take 1 tablet (2.5 mg total) by mouth 2 (two) times daily. 01/29/22 03/01/22  [place] Bali, PA-C  busPIRone (BUSPAR) 5 MG tablet Take 1 tablet (5 mg total) by mouth 2 (two) times daily. 02/03/22   Cherie Ouch, PA-C  diphenhydrAMINE (BENADRYL) 25 MG tablet Take 25 mg by mouth 3 (three) times daily as needed for allergies.    [provider]  docusate sodium (COLACE) 100 MG capsule Take 1 capsule (100 mg total) by mouth 2 (two) times daily as needed for mild constipation. 02/21/21   Maczis,  Elmer Sow, PA-C  methocarbamol (ROBAXIN) 500 MG tablet Take 1 tablet (500 mg total) by mouth every 6 (six) hours as needed for muscle spasms. 01/29/22   [place] Bali, PA-C  Oxycodone HCl 10 MG TABS Take 0.5-1 tablets (5-10 mg total) by mouth every 4 (four) hours as needed for severe pain or moderate pain (5 moderate pain, 10 mg severe pain). 01/29/22   [place] Bali, PA-C  pregabalin (LYRICA) 300 MG capsule Take 300 mg by mouth 2 (two) times daily. 01/25/21   [provider]  QUEtiapine Fumarate (SEROQUEL XR) 150 MG 24 hr tablet Take 1 tablet (150 mg total) by mouth at bedtime. 01/27/22   Hurst, [name] T, PA-C  UBRELVY 100 MG TABS Take 1 tablet by mouth daily as needed for migraine. 11/18/20   [provider]  Vitamin D, Ergocalciferol, (DRISDOL) 1.25 MG (50000 UNIT) CAPS capsule Take 1 capsule (50,000 Units total) by mouth every 7 (seven) days. 01/29/22   [place] Bali, PA-C      Allergies    Amitriptyline, Celexa [citalopram], Naproxen, Citalopram, Naproxen, Ondansetron hcl, Other, and Zofran [ondansetron]    Review of Systems   Review of Systems  Constitutional:  Negative for chills and fever.  HENT:  Negative for sore throat.   Respiratory:  Negative for shortness of breath.   Cardiovascular:  Negative for chest pain.  Gastrointestinal:  Negative for abdominal pain, nausea and vomiting.  Genitourinary:  Negative for flank pain.  Musculoskeletal:  Positive for arthralgias and myalgias.  Skin:  Positive for wound.  Neurological:  Negative for light-headedness and headaches.  All other systems reviewed and are negative.   Physical Exam Updated Vital Signs BP 108/77   Pulse 88   Temp 97.6 F (36.4 C) (Oral)   Resp 18   Ht 5\' 2"  (1.575 m)   Wt 83.9 kg   SpO2 93%   BMI 33.84 kg/m  Physical Exam Vitals and nursing note reviewed.  Constitutional:      Comments: Appears very uncomfortable in pain.  HENT:     Head: Normocephalic and atraumatic.     Comments:  No obvious facial deformity.  No palpable goose egg.  No pain along the cervical spine.    Mouth/Throat:     Mouth: Mucous membranes are moist.  Eyes:     Pupils: Pupils are equal, round, and reactive to light.  Cardiovascular:     Rate and Rhythm: Normal rate.     Pulses:          Popliteal pulses are 2+ on the right side and 2+ on the left side.       Dorsalis pedis pulses are 2+ on the right side and 2+ on the left side.  Pulmonary:     Effort: Pulmonary effort is normal.  Abdominal:     General: Abdomen is flat.  Musculoskeletal:     Cervical back: Normal range of motion and neck supple.     Left knee: Swelling and erythema present.     Comments: Bruising, erythema, active drainage from the wound.  Any thickened tenderness to palpation with any type of movement. Limited ROM due to pain. Please see photos attached.   Skin:    General: Skin is warm and dry.     Findings: Bruising and erythema present.  Neurological:     Mental Status: She is alert and oriented to person, place, and time.            ED Results / Procedures / Treatments   Labs (all labs ordered are listed, but only abnormal results are displayed) Labs Reviewed - No data to display  EKG None  Radiology DG Foot Complete Left  Result Date: 02/04/2022 CLINICAL DATA:  Left leg pain, post open fracture fixation EXAM: LEFT FOOT - COMPLETE 3+ VIEW COMPARISON:  None Available. FINDINGS: There is no evidence of fracture or dislocation. There is no evidence of arthropathy or other focal bone abnormality. Diffuse soft tissue swelling. IMPRESSION: 1. No acute fracture or dislocation identified about the left foot. 2. Diffuse soft tissue swelling. Electronically Signed   By: 02/06/2022 M.D.   On: 02/04/2022 17:48   DG Ankle Complete Left  Result Date: 02/04/2022 CLINICAL DATA:  Status post fall. EXAM: LEFT ANKLE COMPLETE - 3+ VIEW COMPARISON:  None Available. FINDINGS: There is no evidence of fracture,  dislocation, or joint effusion. There is no evidence of arthropathy or other focal bone abnormality. Diffuse soft tissue swelling. IMPRESSION: 1. No acute fracture or dislocation identified about the left ankle. 2. Diffuse soft tissue swelling. Electronically Signed   By: 02/06/2022 M.D.   On: 02/04/2022 17:46   CT HEAD WO CONTRAST (02/06/2022)  Result Date: 02/04/2022 CLINICAL DATA:  Recent trauma EXAM: CT HEAD WITHOUT CONTRAST TECHNIQUE: Contiguous axial images were obtained from the base of the skull  through the vertex without intravenous contrast. RADIATION DOSE REDUCTION: This exam was performed according to the departmental dose-optimization program which includes automated exposure control, adjustment of the mA and/or kV according to patient size and/or use of iterative reconstruction technique. COMPARISON:  06/29/2021 FINDINGS: Brain: No evidence of acute infarction, hemorrhage, cerebral edema, mass, mass effect, or midline shift. No hydrocephalus or extra-axial fluid collection. Vascular: No hyperdense vessel. Skull: Normal. Negative for fracture or focal lesion. Sinuses/Orbits: No acute finding. Other: The mastoid air cells are well aerated. IMPRESSION: No acute intracranial process. Electronically Signed   By: Wiliam Ke M.D.   On: 02/04/2022 16:53   DG Knee 2 Views Left  Result Date: 02/04/2022 CLINICAL DATA:  Fall with open tibia/fibula fracture. Stitches came out 2 days ago. EXAM: LEFT KNEE - 1-2 VIEW COMPARISON:  Knee radiographs 01/28/2022. FINDINGS: Postsurgical changes reflecting sideplate and screw fixation of the comminuted proximal tibial fracture are again seen. Hardware alignment is stable and within expected limits, without evidence of hardware related complication. The fracture alignment appears unchanged, allowing for slight differences in obliquity, with unchanged slight medial location of the dominant proximal tibial fracture fragment. No new fracture is seen. Postoperative  soft tissue gas about the knee has resolved. IMPRESSION: Postsurgical changes reflecting sideplate and screw fixation of the comminuted proximal tibial fracture without evidence of hardware related complication or new acute injury. Fracture alignment is unchanged allowing for slight differences in patient obliquity. Decreased surrounding postoperative soft tissue gas since the prior study. Electronically Signed   By: Lesia Hausen M.D.   On: 02/04/2022 16:38    Procedures Procedures    Medications Ordered in ED Medications  oxyCODONE (Oxy IR/ROXICODONE) immediate release tablet 10 mg (10 mg Oral Given 02/04/22 1543)  sodium chloride 0.9 % bolus 500 mL (0 mLs Intravenous Stopped 02/04/22 1917)  bacitracin 500 UNIT/GM ointment (  Given 02/04/22 2114)    ED Course/ Medical Decision Making/ A&P                           Medical Decision Making Amount and/or Complexity of Data Reviewed Radiology: ordered.  Risk Prescription drug management.   This patient presents to the ED for concern of left knee pain, this involves a number of treatment options, and is a complaint that carries with it a high risk of complications and morbidity.  The differential diagnosis includes    Co morbidities: Discussed in HPI   Brief History:  Patient here status post mechanical fall while ambulating down the steps.  Endorsing pain along the left knee.  She is nonweightbearing to her left knee after surgery approximately 7 days ago.  She is getting around the house via wheelchair.  Reports she has not ambulated since the accident as she continues to have pain.  She is followed by pain management  EMR reviewed including pt PMHx, past surgical history and past visits to ER.   See HPI for more details   Lab Tests:  I ordered and independently interpreted labs.  The pertinent results include:    N/A   Imaging Studies:    X-ray of the left knee showed Postsurgical changes reflecting sideplate and screw  fixation of the  comminuted proximal tibial fracture without evidence of hardware  related complication or new acute injury. Fracture alignment is  unchanged allowing for slight differences in patient obliquity.  Decreased surrounding postoperative soft tissue gas since the prior  study.   X-ray of  the left ankle, left foot without any acute findings.  Patient is on Eliquis, although she is not sure whether she struck her head or not a CT of her head was obtained which did not show any acute changes.   Medicines ordered:  I ordered medication including oxycodone  for pain control Reevaluation of the patient after these medicines showed that the patient improved I have reviewed the patients home medicines and have made adjustments as needed Reevaluation:  After the interventions noted above I re-evaluated patient and found that they have :improved   Social Determinants of Health:  The patient's social determinants of health were a factor in the care of this patient    Problem List / ED Course:  Patient resents to the ED status post mechanical fall while ambulating down the steps.  Patient recently had knee patient by Dr. [name] approximately 1 week ago.  Reports there is pain to the area, has not been able to ambulate although she is supposed to be not weightbearing and gets around at home via wheelchair.  Patient is her primary caretaker, with the help of her boyfriend's son.  She is unsure whether she struck her head, is on Eliquis.  CT head obtained which did not show any intracranial abnormality.  X-rays of her left ankle, left knee, left foot were obtained which did not show any acute findings or any new injury noted.  She was given oxy for pain control.  I did give her a bolus of saline to help with blood pressures, she was normotensive upon reevaluation.  Discussed with her follow-up with orthopedist at her earliest appointment.  She is agreeable of discharge via [facility].     Dispostion:  After consideration of the diagnostic results and the patients response to treatment, I feel that the patent would benefit from outpatient follow-up with orthopedics.    Portions of this note were generated with Scientist, clinical (histocompatibility and immunogenetics). Dictation errors may occur despite best attempts at proofreading.   Final Clinical Impression(s) / ED Diagnoses Final diagnoses:  Fall, initial encounter  Left leg pain    Rx / DC Orders ED Discharge Orders     None         Freddy Jaksch 02/04/22 2154    Gerhard Munch, MD 02/16/22 [phone]

## 2022-02-04 NOTE — ED Notes (Signed)
This RN has called [facility] to arrange transportation home.

## 2022-02-04 NOTE — ED Notes (Signed)
Attempted to ambulate the patient, she states she doesn't want to get up to walk because she cant walk right now. PA informed.

## 2022-02-04 NOTE — ED Notes (Signed)
Pt taken home via [facility].  

## 2022-02-12 ENCOUNTER — Inpatient Hospital Stay (HOSPITAL_COMMUNITY)
Admission: EM | Admit: 2022-02-12 | Discharge: 2022-02-23 | DRG: 480 | Disposition: A | Discharge status: Home Health | Payer: Medicaid Other | Attending: Family Medicine | Admitting: Family Medicine

## 2022-02-12 ENCOUNTER — Other Ambulatory Visit (HOSPITAL_BASED_OUTPATIENT_CLINIC_OR_DEPARTMENT_OTHER): Payer: Self-pay

## 2022-02-12 DIAGNOSIS — E559 Vitamin D deficiency, unspecified: Secondary | ICD-10-CM | POA: Diagnosis present

## 2022-02-12 DIAGNOSIS — F32A Depression, unspecified: Secondary | ICD-10-CM | POA: Diagnosis present

## 2022-02-12 DIAGNOSIS — M25362 Other instability, left knee: Secondary | ICD-10-CM | POA: Diagnosis present

## 2022-02-12 DIAGNOSIS — A419 Sepsis, unspecified organism: Secondary | ICD-10-CM | POA: Diagnosis present

## 2022-02-12 DIAGNOSIS — S82142A Displaced bicondylar fracture of left tibia, initial encounter for closed fracture: Secondary | ICD-10-CM | POA: Diagnosis present

## 2022-02-12 DIAGNOSIS — W050XXA Fall from non-moving wheelchair, initial encounter: Secondary | ICD-10-CM | POA: Diagnosis present

## 2022-02-12 DIAGNOSIS — E669 Obesity, unspecified: Secondary | ICD-10-CM | POA: Diagnosis present

## 2022-02-12 DIAGNOSIS — G43909 Migraine, unspecified, not intractable, without status migrainosus: Secondary | ICD-10-CM | POA: Diagnosis present

## 2022-02-12 DIAGNOSIS — Z79899 Other long term (current) drug therapy: Secondary | ICD-10-CM

## 2022-02-12 DIAGNOSIS — D62 Acute posthemorrhagic anemia: Secondary | ICD-10-CM | POA: Diagnosis present

## 2022-02-12 DIAGNOSIS — F4310 Post-traumatic stress disorder, unspecified: Secondary | ICD-10-CM | POA: Diagnosis present

## 2022-02-12 DIAGNOSIS — F411 Generalized anxiety disorder: Secondary | ICD-10-CM | POA: Diagnosis present

## 2022-02-12 DIAGNOSIS — S82402A Unspecified fracture of shaft of left fibula, initial encounter for closed fracture: Secondary | ICD-10-CM | POA: Diagnosis present

## 2022-02-12 DIAGNOSIS — Z7901 Long term (current) use of anticoagulants: Secondary | ICD-10-CM

## 2022-02-12 DIAGNOSIS — G4700 Insomnia, unspecified: Secondary | ICD-10-CM | POA: Diagnosis present

## 2022-02-12 DIAGNOSIS — M00062 Staphylococcal arthritis, left knee: Secondary | ICD-10-CM | POA: Diagnosis present

## 2022-02-12 DIAGNOSIS — Z79891 Long term (current) use of opiate analgesic: Secondary | ICD-10-CM

## 2022-02-12 DIAGNOSIS — R000 Tachycardia, unspecified: Secondary | ICD-10-CM

## 2022-02-12 DIAGNOSIS — T8454XA Infection and inflammatory reaction due to internal left knee prosthesis, initial encounter: Principal | ICD-10-CM | POA: Diagnosis present

## 2022-02-12 DIAGNOSIS — D649 Anemia, unspecified: Secondary | ICD-10-CM

## 2022-02-12 DIAGNOSIS — A4902 Methicillin resistant Staphylococcus aureus infection, unspecified site: Secondary | ICD-10-CM

## 2022-02-12 DIAGNOSIS — M545 Low back pain, unspecified: Secondary | ICD-10-CM | POA: Diagnosis present

## 2022-02-12 DIAGNOSIS — F17210 Nicotine dependence, cigarettes, uncomplicated: Secondary | ICD-10-CM | POA: Diagnosis present

## 2022-02-12 DIAGNOSIS — G894 Chronic pain syndrome: Secondary | ICD-10-CM | POA: Diagnosis present

## 2022-02-12 DIAGNOSIS — T8141XA Infection following a procedure, superficial incisional surgical site, initial encounter: Principal | ICD-10-CM

## 2022-02-12 DIAGNOSIS — Y792 Prosthetic and other implants, materials and accessory orthopedic devices associated with adverse incidents: Secondary | ICD-10-CM | POA: Diagnosis present

## 2022-02-12 DIAGNOSIS — A4102 Sepsis due to Methicillin resistant Staphylococcus aureus: Secondary | ICD-10-CM | POA: Diagnosis present

## 2022-02-12 DIAGNOSIS — Z888 Allergy status to other drugs, medicaments and biological substances status: Secondary | ICD-10-CM

## 2022-02-12 DIAGNOSIS — Z7401 Bed confinement status: Secondary | ICD-10-CM

## 2022-02-12 DIAGNOSIS — Z8249 Family history of ischemic heart disease and other diseases of the circulatory system: Secondary | ICD-10-CM

## 2022-02-12 DIAGNOSIS — Z6839 Body mass index (BMI) 39.0-39.9, adult: Secondary | ICD-10-CM

## 2022-02-12 DIAGNOSIS — M009 Pyogenic arthritis, unspecified: Secondary | ICD-10-CM

## 2022-02-12 NOTE — ED Triage Notes (Signed)
Patient coming to ED for evaluation of pain to L knee.  Reports having surgery due to fracture 3 weeks ago.  Taking Rx pain medications without relief.  Has "oozing" from surgical site.  No reports of fever.  Was given Fentanyl 100 mcg IV PTA

## 2022-02-13 ENCOUNTER — Emergency Department (HOSPITAL_COMMUNITY): Payer: Medicaid Other

## 2022-02-13 ENCOUNTER — Inpatient Hospital Stay (HOSPITAL_COMMUNITY): Payer: Medicaid Other | Admitting: Certified Registered Nurse Anesthetist

## 2022-02-13 ENCOUNTER — Inpatient Hospital Stay (HOSPITAL_COMMUNITY): Payer: Medicaid Other

## 2022-02-13 ENCOUNTER — Other Ambulatory Visit: Payer: Self-pay

## 2022-02-13 ENCOUNTER — Encounter (HOSPITAL_COMMUNITY)
Admission: EM | Disposition: A | Discharge status: Home Health | Payer: Self-pay | Source: Home / Self Care | Attending: Family Medicine

## 2022-02-13 ENCOUNTER — Encounter (HOSPITAL_COMMUNITY): Payer: Self-pay | Admitting: Internal Medicine

## 2022-02-13 DIAGNOSIS — M25362 Other instability, left knee: Secondary | ICD-10-CM

## 2022-02-13 DIAGNOSIS — G894 Chronic pain syndrome: Secondary | ICD-10-CM | POA: Diagnosis present

## 2022-02-13 DIAGNOSIS — M009 Pyogenic arthritis, unspecified: Secondary | ICD-10-CM | POA: Diagnosis present

## 2022-02-13 DIAGNOSIS — E669 Obesity, unspecified: Secondary | ICD-10-CM | POA: Diagnosis present

## 2022-02-13 DIAGNOSIS — F4310 Post-traumatic stress disorder, unspecified: Secondary | ICD-10-CM | POA: Diagnosis present

## 2022-02-13 DIAGNOSIS — Z6839 Body mass index (BMI) 39.0-39.9, adult: Secondary | ICD-10-CM | POA: Diagnosis not present

## 2022-02-13 DIAGNOSIS — T847XXA Infection and inflammatory reaction due to other internal orthopedic prosthetic devices, implants and grafts, initial encounter: Secondary | ICD-10-CM | POA: Diagnosis not present

## 2022-02-13 DIAGNOSIS — Z7901 Long term (current) use of anticoagulants: Secondary | ICD-10-CM | POA: Diagnosis not present

## 2022-02-13 DIAGNOSIS — T84023A Instability of internal left knee prosthesis, initial encounter: Secondary | ICD-10-CM | POA: Diagnosis not present

## 2022-02-13 DIAGNOSIS — T8140XA Infection following a procedure, unspecified, initial encounter: Secondary | ICD-10-CM

## 2022-02-13 DIAGNOSIS — G4700 Insomnia, unspecified: Secondary | ICD-10-CM | POA: Diagnosis present

## 2022-02-13 DIAGNOSIS — D72825 Bandemia: Secondary | ICD-10-CM

## 2022-02-13 DIAGNOSIS — D62 Acute posthemorrhagic anemia: Secondary | ICD-10-CM | POA: Diagnosis present

## 2022-02-13 DIAGNOSIS — A419 Sepsis, unspecified organism: Secondary | ICD-10-CM | POA: Diagnosis not present

## 2022-02-13 DIAGNOSIS — M00862 Arthritis due to other bacteria, left knee: Secondary | ICD-10-CM | POA: Diagnosis not present

## 2022-02-13 DIAGNOSIS — S82402A Unspecified fracture of shaft of left fibula, initial encounter for closed fracture: Secondary | ICD-10-CM | POA: Diagnosis present

## 2022-02-13 DIAGNOSIS — S82142A Displaced bicondylar fracture of left tibia, initial encounter for closed fracture: Secondary | ICD-10-CM | POA: Diagnosis present

## 2022-02-13 DIAGNOSIS — Z79891 Long term (current) use of opiate analgesic: Secondary | ICD-10-CM | POA: Diagnosis not present

## 2022-02-13 DIAGNOSIS — F17210 Nicotine dependence, cigarettes, uncomplicated: Secondary | ICD-10-CM | POA: Diagnosis present

## 2022-02-13 DIAGNOSIS — W050XXA Fall from non-moving wheelchair, initial encounter: Secondary | ICD-10-CM | POA: Diagnosis present

## 2022-02-13 DIAGNOSIS — A4102 Sepsis due to Methicillin resistant Staphylococcus aureus: Secondary | ICD-10-CM | POA: Diagnosis present

## 2022-02-13 DIAGNOSIS — Z7401 Bed confinement status: Secondary | ICD-10-CM | POA: Diagnosis not present

## 2022-02-13 DIAGNOSIS — D72829 Elevated white blood cell count, unspecified: Secondary | ICD-10-CM | POA: Diagnosis not present

## 2022-02-13 DIAGNOSIS — D649 Anemia, unspecified: Secondary | ICD-10-CM | POA: Diagnosis not present

## 2022-02-13 DIAGNOSIS — F32A Depression, unspecified: Secondary | ICD-10-CM | POA: Diagnosis present

## 2022-02-13 DIAGNOSIS — T8454XA Infection and inflammatory reaction due to internal left knee prosthesis, initial encounter: Secondary | ICD-10-CM | POA: Diagnosis present

## 2022-02-13 DIAGNOSIS — Y792 Prosthetic and other implants, materials and accessory orthopedic devices associated with adverse incidents: Secondary | ICD-10-CM | POA: Diagnosis present

## 2022-02-13 DIAGNOSIS — B9562 Methicillin resistant Staphylococcus aureus infection as the cause of diseases classified elsewhere: Secondary | ICD-10-CM | POA: Diagnosis not present

## 2022-02-13 DIAGNOSIS — Z79899 Other long term (current) drug therapy: Secondary | ICD-10-CM | POA: Diagnosis not present

## 2022-02-13 DIAGNOSIS — E559 Vitamin D deficiency, unspecified: Secondary | ICD-10-CM | POA: Diagnosis present

## 2022-02-13 DIAGNOSIS — M00062 Staphylococcal arthritis, left knee: Secondary | ICD-10-CM | POA: Diagnosis present

## 2022-02-13 DIAGNOSIS — F411 Generalized anxiety disorder: Secondary | ICD-10-CM | POA: Diagnosis present

## 2022-02-13 DIAGNOSIS — Z888 Allergy status to other drugs, medicaments and biological substances status: Secondary | ICD-10-CM | POA: Diagnosis not present

## 2022-02-13 DIAGNOSIS — M545 Low back pain, unspecified: Secondary | ICD-10-CM | POA: Diagnosis present

## 2022-02-13 DIAGNOSIS — G43909 Migraine, unspecified, not intractable, without status migrainosus: Secondary | ICD-10-CM | POA: Diagnosis present

## 2022-02-13 DIAGNOSIS — T847XXD Infection and inflammatory reaction due to other internal orthopedic prosthetic devices, implants and grafts, subsequent encounter: Secondary | ICD-10-CM | POA: Diagnosis not present

## 2022-02-13 DIAGNOSIS — Z8249 Family history of ischemic heart disease and other diseases of the circulatory system: Secondary | ICD-10-CM | POA: Diagnosis not present

## 2022-02-13 HISTORY — PX: I & D EXTREMITY: SHX5045

## 2022-02-13 LAB — CBC WITH DIFFERENTIAL/PLATELET
Abs Immature Granulocytes: 0.12 10*3/uL — ABNORMAL HIGH (ref 0.00–0.07)
Basophils Absolute: 0 10*3/uL (ref 0.0–0.1)
Basophils Relative: 0 %
Eosinophils Absolute: 0.1 10*3/uL (ref 0.0–0.5)
Eosinophils Relative: 1 %
HCT: 37.4 % (ref 36.0–46.0)
Hemoglobin: 12 g/dL (ref 12.0–15.0)
Immature Granulocytes: 1 %
Lymphocytes Relative: 8 %
Lymphs Abs: 1.6 10*3/uL (ref 0.7–4.0)
MCH: 29.5 pg (ref 26.0–34.0)
MCHC: 32.1 g/dL (ref 30.0–36.0)
MCV: 91.9 fL (ref 80.0–100.0)
Monocytes Absolute: 1.2 10*3/uL — ABNORMAL HIGH (ref 0.1–1.0)
Monocytes Relative: 6 %
Neutro Abs: 16.6 10*3/uL — ABNORMAL HIGH (ref 1.7–7.7)
Neutrophils Relative %: 84 %
Platelets: 538 10*3/uL — ABNORMAL HIGH (ref 150–400)
RBC: 4.07 MIL/uL (ref 3.87–5.11)
RDW: 14.7 % (ref 11.5–15.5)
WBC: 19.6 10*3/uL — ABNORMAL HIGH (ref 4.0–10.5)
nRBC: 0 % (ref 0.0–0.2)

## 2022-02-13 LAB — BASIC METABOLIC PANEL
Anion gap: 9 (ref 5–15)
BUN: 13 mg/dL (ref 6–20)
CO2: 17 mmol/L — ABNORMAL LOW (ref 22–32)
Calcium: 9 mg/dL (ref 8.9–10.3)
Chloride: 114 mmol/L — ABNORMAL HIGH (ref 98–111)
Creatinine, Ser: 0.59 mg/dL (ref 0.44–1.00)
GFR, Estimated: >60 mL/min (ref >60)
Glucose, Bld: 134 mg/dL — ABNORMAL HIGH (ref 70–99)
Potassium: 3.5 mmol/L (ref 3.5–5.1)
Sodium: 140 mmol/L (ref 135–145)

## 2022-02-13 LAB — LACTIC ACID, PLASMA
Lactic Acid, Venous: 0.8 mmol/L (ref 0.5–1.9)
Lactic Acid, Venous: 0.9 mmol/L (ref 0.5–1.9)

## 2022-02-13 LAB — C-REACTIVE PROTEIN: CRP: 24.6 mg/dL — ABNORMAL HIGH (ref <1.0)

## 2022-02-13 LAB — SEDIMENTATION RATE: Sed Rate: 34 mm/hr — ABNORMAL HIGH (ref 0–22)

## 2022-02-13 LAB — MAGNESIUM: Magnesium: 2.1 mg/dL (ref 1.7–2.4)

## 2022-02-13 SURGERY — IRRIGATION AND DEBRIDEMENT EXTREMITY
Anesthesia: General | Laterality: Left

## 2022-02-13 MED ORDER — OXYCODONE HCL 5 MG/5ML PO SOLN: 5.0000 mg | Freq: Once | ORAL | Status: DC | PRN

## 2022-02-13 MED ORDER — CHLORHEXIDINE GLUCONATE 0.12 % MT SOLN
15.0000 mL | Freq: Once | OROMUCOSAL | Status: AC
  Administered 2022-02-13: 15 mL via OROMUCOSAL

## 2022-02-13 MED ORDER — EPHEDRINE 5 MG/ML INJ
INTRAVENOUS | Status: AC
  Filled 2022-02-13: qty 5

## 2022-02-13 MED ORDER — MIDAZOLAM HCL 2 MG/2ML IJ SOLN
INTRAMUSCULAR | Status: DC | PRN
  Administered 2022-02-13: 2 mg via INTRAVENOUS

## 2022-02-13 MED ORDER — PROPOFOL 10 MG/ML IV BOLUS
INTRAVENOUS | Status: DC | PRN
  Administered 2022-02-13: 200 mg via INTRAVENOUS

## 2022-02-13 MED ORDER — LORAZEPAM 2 MG/ML IJ SOLN
0.5000 mg | Freq: Four times a day (QID) | INTRAMUSCULAR | Status: AC | PRN
  Administered 2022-02-13 – 2022-02-14 (×3): 0.5 mg via INTRAVENOUS
  Filled 2022-02-13 (×3): qty 1

## 2022-02-13 MED ORDER — HYDROMORPHONE HCL 1 MG/ML IJ SOLN: 0.5000 mg | INTRAMUSCULAR | Status: DC | PRN

## 2022-02-13 MED ORDER — 0.9 % SODIUM CHLORIDE (POUR BTL) OPTIME
TOPICAL | Status: DC | PRN
  Administered 2022-02-13: 1000 mL

## 2022-02-13 MED ORDER — ORAL CARE MOUTH RINSE: 15.0000 mL | Freq: Once | OROMUCOSAL | Status: AC

## 2022-02-13 MED ORDER — DIPHENHYDRAMINE HCL 50 MG/ML IJ SOLN
INTRAMUSCULAR | Status: DC | PRN
  Administered 2022-02-13: 12.5 mg via INTRAVENOUS

## 2022-02-13 MED ORDER — HYDROMORPHONE HCL 1 MG/ML IJ SOLN
1.0000 mg | Freq: Once | INTRAMUSCULAR | Status: AC
  Administered 2022-02-13: 1 mg via INTRAVENOUS
  Filled 2022-02-13: qty 1

## 2022-02-13 MED ORDER — HYDROMORPHONE HCL 1 MG/ML IJ SOLN
INTRAMUSCULAR | Status: AC
  Filled 2022-02-13: qty 1

## 2022-02-13 MED ORDER — HYDROMORPHONE HCL 1 MG/ML IJ SOLN
0.5000 mg | Freq: Once | INTRAMUSCULAR | Status: AC
  Administered 2022-02-13: 0.5 mg via INTRAVENOUS
  Filled 2022-02-13: qty 1

## 2022-02-13 MED ORDER — PHENYLEPHRINE 80 MCG/ML (10ML) SYRINGE FOR IV PUSH (FOR BLOOD PRESSURE SUPPORT)
PREFILLED_SYRINGE | INTRAVENOUS | Status: AC
  Filled 2022-02-13: qty 20

## 2022-02-13 MED ORDER — APIXABAN 2.5 MG PO TABS
2.5000 mg | ORAL_TABLET | Freq: Two times a day (BID) | ORAL | Status: DC
Start: 2022-02-13 — End: 2022-02-13

## 2022-02-13 MED ORDER — FENTANYL CITRATE (PF) 250 MCG/5ML IJ SOLN
INTRAMUSCULAR | Status: DC | PRN
  Administered 2022-02-13: 150 ug via INTRAVENOUS
  Administered 2022-02-13: 100 ug via INTRAVENOUS
  Administered 2022-02-13: 50 ug via INTRAVENOUS
  Administered 2022-02-13 (×2): 100 ug via INTRAVENOUS

## 2022-02-13 MED ORDER — ONDANSETRON HCL 4 MG/2ML IJ SOLN
4.0000 mg | Freq: Once | INTRAMUSCULAR | Status: DC
  Filled 2022-02-13: qty 2

## 2022-02-13 MED ORDER — DEXMEDETOMIDINE (PRECEDEX) IN NS 20 MCG/5ML (4 MCG/ML) IV SYRINGE
PREFILLED_SYRINGE | INTRAVENOUS | Status: DC | PRN
  Administered 2022-02-13: 20 ug via INTRAVENOUS
  Administered 2022-02-13 (×2): 12 ug via INTRAVENOUS

## 2022-02-13 MED ORDER — PHENYLEPHRINE 80 MCG/ML (10ML) SYRINGE FOR IV PUSH (FOR BLOOD PRESSURE SUPPORT)
PREFILLED_SYRINGE | INTRAVENOUS | Status: DC | PRN
  Administered 2022-02-13 (×2): 160 ug via INTRAVENOUS
  Administered 2022-02-13: 400 ug via INTRAVENOUS
  Administered 2022-02-13: 160 ug via INTRAVENOUS
  Administered 2022-02-13: 400 ug via INTRAVENOUS
  Administered 2022-02-13: 160 ug via INTRAVENOUS

## 2022-02-13 MED ORDER — NALOXONE HCL 0.4 MG/ML IJ SOLN: 0.4000 mg | INTRAMUSCULAR | Status: DC | PRN

## 2022-02-13 MED ORDER — KETAMINE HCL 50 MG/5ML IJ SOSY
PREFILLED_SYRINGE | INTRAMUSCULAR | Status: AC
  Filled 2022-02-13: qty 5

## 2022-02-13 MED ORDER — HYDROMORPHONE HCL 1 MG/ML IJ SOLN
1.0000 mg | INTRAMUSCULAR | Status: DC | PRN
  Administered 2022-02-13 – 2022-02-15 (×18): 1 mg via INTRAVENOUS
  Filled 2022-02-13 (×18): qty 1

## 2022-02-13 MED ORDER — FENTANYL CITRATE (PF) 250 MCG/5ML IJ SOLN
INTRAMUSCULAR | Status: AC
  Filled 2022-02-13: qty 5

## 2022-02-13 MED ORDER — KETAMINE HCL 10 MG/ML IJ SOLN
INTRAMUSCULAR | Status: DC | PRN
  Administered 2022-02-13: 20 mg via INTRAVENOUS
  Administered 2022-02-13: 30 mg via INTRAVENOUS

## 2022-02-13 MED ORDER — LACTATED RINGERS IV SOLN: INTRAVENOUS | Status: DC

## 2022-02-13 MED ORDER — ENOXAPARIN SODIUM 40 MG/0.4ML IJ SOSY: 40.0000 mg | PREFILLED_SYRINGE | INTRAMUSCULAR | Status: DC

## 2022-02-13 MED ORDER — ONDANSETRON HCL 4 MG/2ML IJ SOLN: 4.0000 mg | Freq: Once | INTRAMUSCULAR | Status: DC | PRN

## 2022-02-13 MED ORDER — AMISULPRIDE (ANTIEMETIC) 5 MG/2ML IV SOLN: 10.0000 mg | Freq: Once | INTRAVENOUS | Status: DC | PRN

## 2022-02-13 MED ORDER — MIDAZOLAM HCL 2 MG/2ML IJ SOLN
INTRAMUSCULAR | Status: AC
  Filled 2022-02-13: qty 2

## 2022-02-13 MED ORDER — HYDROMORPHONE HCL 1 MG/ML IJ SOLN
INTRAMUSCULAR | Status: AC
  Filled 2022-02-13: qty 0.5

## 2022-02-13 MED ORDER — POTASSIUM CHLORIDE 2 MEQ/ML IV SOLN
INTRAVENOUS | Status: AC
  Filled 2022-02-13: qty 1000

## 2022-02-13 MED ORDER — ALBUMIN HUMAN 5 % IV SOLN
12.5000 g | Freq: Once | INTRAVENOUS | Status: AC
  Administered 2022-02-13: 12.5 g via INTRAVENOUS

## 2022-02-13 MED ORDER — VANCOMYCIN HCL 1000 MG IV SOLR
INTRAVENOUS | Status: DC | PRN
  Administered 2022-02-13: 1000 mg

## 2022-02-13 MED ORDER — VANCOMYCIN HCL 1500 MG/300ML IV SOLN
1500.0000 mg | Freq: Once | INTRAVENOUS | Status: AC
  Administered 2022-02-13: 1500 mg via INTRAVENOUS
  Filled 2022-02-13: qty 300

## 2022-02-13 MED ORDER — PREGABALIN 100 MG PO CAPS
300.0000 mg | ORAL_CAPSULE | Freq: Two times a day (BID) | ORAL | Status: DC
  Administered 2022-02-13 – 2022-02-23 (×22): 300 mg via ORAL
  Filled 2022-02-13 (×22): qty 3

## 2022-02-13 MED ORDER — SODIUM CHLORIDE 0.9 % IV SOLN
2.0000 g | INTRAVENOUS | Status: DC
  Administered 2022-02-13: 2 g via INTRAVENOUS
  Filled 2022-02-13: qty 20

## 2022-02-13 MED ORDER — SODIUM CHLORIDE 0.9 % IR SOLN
Status: DC | PRN
  Administered 2022-02-13: 6000 mL

## 2022-02-13 MED ORDER — DEXAMETHASONE SODIUM PHOSPHATE 10 MG/ML IJ SOLN
INTRAMUSCULAR | Status: DC | PRN
  Administered 2022-02-13: 8 mg via INTRAVENOUS

## 2022-02-13 MED ORDER — ACETAMINOPHEN 650 MG RE SUPP: 650.0000 mg | Freq: Four times a day (QID) | RECTAL | Status: DC | PRN

## 2022-02-13 MED ORDER — METHOCARBAMOL 500 MG PO TABS
500.0000 mg | ORAL_TABLET | Freq: Once | ORAL | Status: AC
  Administered 2022-02-13: 500 mg via ORAL
  Filled 2022-02-13: qty 1

## 2022-02-13 MED ORDER — VANCOMYCIN HCL 1250 MG/250ML IV SOLN
1250.0000 mg | INTRAVENOUS | Status: DC
  Administered 2022-02-14: 1250 mg via INTRAVENOUS
  Filled 2022-02-13: qty 250

## 2022-02-13 MED ORDER — TOBRAMYCIN SULFATE 1.2 G IJ SOLR
INTRAMUSCULAR | Status: DC | PRN
  Administered 2022-02-13: 1.2 g

## 2022-02-13 MED ORDER — TOBRAMYCIN SULFATE 1.2 G IJ SOLR
INTRAMUSCULAR | Status: AC
  Filled 2022-02-13: qty 1.2

## 2022-02-13 MED ORDER — VANCOMYCIN HCL 1000 MG IV SOLR
INTRAVENOUS | Status: AC
  Filled 2022-02-13: qty 20

## 2022-02-13 MED ORDER — OXYCODONE HCL 5 MG PO TABS: 5.0000 mg | ORAL_TABLET | Freq: Once | ORAL | Status: DC | PRN

## 2022-02-13 MED ORDER — LIDOCAINE 2% (20 MG/ML) 5 ML SYRINGE
INTRAMUSCULAR | Status: DC | PRN
  Administered 2022-02-13: 100 mg via INTRAVENOUS

## 2022-02-13 MED ORDER — ACETAMINOPHEN 325 MG PO TABS
650.0000 mg | ORAL_TABLET | Freq: Four times a day (QID) | ORAL | Status: DC | PRN
  Administered 2022-02-13 – 2022-02-15 (×2): 650 mg via ORAL
  Filled 2022-02-13 (×2): qty 2

## 2022-02-13 MED ORDER — PHENYLEPHRINE 80 MCG/ML (10ML) SYRINGE FOR IV PUSH (FOR BLOOD PRESSURE SUPPORT)
PREFILLED_SYRINGE | INTRAVENOUS | Status: AC
  Filled 2022-02-13: qty 10

## 2022-02-13 MED ORDER — ALBUMIN HUMAN 5 % IV SOLN
INTRAVENOUS | Status: AC
  Filled 2022-02-13: qty 250

## 2022-02-13 MED ORDER — HYDROMORPHONE HCL 1 MG/ML IJ SOLN
0.2500 mg | INTRAMUSCULAR | Status: DC | PRN
  Administered 2022-02-13 (×4): 0.5 mg via INTRAVENOUS

## 2022-02-13 SURGICAL SUPPLY — 53 items
APL PRP STRL LF DISP 70% ISPRP (MISCELLANEOUS) ×3
BAG COUNTER SPONGE SURGICOUNT (BAG) ×2 IMPLANT
BAG SPNG CNTER NS LX DISP (BAG) ×1
BNDG CMPR MED 10X6 ELC LF (GAUZE/BANDAGES/DRESSINGS) ×2
BNDG ELASTIC 4X5.8 VLCR STR LF (GAUZE/BANDAGES/DRESSINGS) ×2 IMPLANT
BNDG ELASTIC 6X10 VLCR STRL LF (GAUZE/BANDAGES/DRESSINGS) ×2 IMPLANT
BNDG ELASTIC 6X5.8 VLCR STR LF (GAUZE/BANDAGES/DRESSINGS) ×2 IMPLANT
BRUSH SCRUB EZ PLAIN DRY (MISCELLANEOUS) ×4 IMPLANT
CHLORAPREP W/TINT 26 (MISCELLANEOUS) ×4 IMPLANT
COVER MAYO STAND STRL (DRAPES) ×2 IMPLANT
COVER SURGICAL LIGHT HANDLE (MISCELLANEOUS) ×3 IMPLANT
DRAPE C-ARM 42X72 X-RAY (DRAPES) ×1 IMPLANT
DRAPE C-ARMOR (DRAPES) ×2 IMPLANT
DRAPE IMP U-DRAPE 54X76 (DRAPES) ×4 IMPLANT
DRAPE ORTHO SPLIT 77X108 STRL (DRAPES) ×4
DRAPE SURG 17X23 STRL (DRAPES) ×2 IMPLANT
DRAPE SURG ORHT 6 SPLT 77X108 (DRAPES) ×2 IMPLANT
DRAPE U-SHAPE 47X51 STRL (DRAPES) ×2 IMPLANT
DRSG MEPITEL 4X7.2 (GAUZE/BANDAGES/DRESSINGS) ×1 IMPLANT
DRSG TEGADERM 4X4.75 (GAUZE/BANDAGES/DRESSINGS) ×1 IMPLANT
ELECT REM PT RETURN 9FT ADLT (ELECTROSURGICAL) ×2
ELECTRODE REM PT RTRN 9FT ADLT (ELECTROSURGICAL) ×1 IMPLANT
EVACUATOR 3/16  PVC DRAIN (DRAIN) ×2
EVACUATOR 3/16 PVC DRAIN (DRAIN) IMPLANT
GAUZE SPONGE 4X4 12PLY STRL (GAUZE/BANDAGES/DRESSINGS) ×1 IMPLANT
GLOVE BIO SURGEON STRL SZ 6.5 (GLOVE) ×6 IMPLANT
GLOVE BIO SURGEON STRL SZ7.5 (GLOVE) ×8 IMPLANT
GLOVE BIOGEL PI IND STRL 6.5 (GLOVE) ×1 IMPLANT
GLOVE BIOGEL PI IND STRL 7.5 (GLOVE) ×1 IMPLANT
GLOVE BIOGEL PI INDICATOR 6.5 (GLOVE) ×1
GLOVE BIOGEL PI INDICATOR 7.5 (GLOVE) ×1
GOWN STRL REUS W/ TWL LRG LVL3 (GOWN DISPOSABLE) ×2 IMPLANT
GOWN STRL REUS W/TWL LRG LVL3 (GOWN DISPOSABLE) ×4
HANDPIECE INTERPULSE COAX TIP (DISPOSABLE) ×2
KIT BASIN OR (CUSTOM PROCEDURE TRAY) ×2 IMPLANT
KIT TURNOVER KIT B (KITS) ×2 IMPLANT
MANIFOLD NEPTUNE II (INSTRUMENTS) ×2 IMPLANT
NS IRRIG 1000ML POUR BTL (IV SOLUTION) ×2 IMPLANT
PACK ORTHO EXTREMITY (CUSTOM PROCEDURE TRAY) ×2 IMPLANT
PAD ARMBOARD 7.5X6 YLW CONV (MISCELLANEOUS) ×4 IMPLANT
PAD CAST 4YDX4 CTTN HI CHSV (CAST SUPPLIES) IMPLANT
PADDING CAST COTTON 4X4 STRL (CAST SUPPLIES) ×2
PADDING CAST COTTON 6X4 STRL (CAST SUPPLIES) ×2 IMPLANT
SET HNDPC FAN SPRY TIP SCT (DISPOSABLE) IMPLANT
SPONGE T-LAP 18X18 ~~LOC~~+RFID (SPONGE) ×2 IMPLANT
STAPLER VISISTAT 35W (STAPLE) ×2 IMPLANT
SUT ETHILON 3 0 PS 1 (SUTURE) ×4 IMPLANT
SUT MON AB 2-0 CT1 36 (SUTURE) ×2 IMPLANT
TOWEL GREEN STERILE (TOWEL DISPOSABLE) ×4 IMPLANT
TOWEL GREEN STERILE FF (TOWEL DISPOSABLE) ×4 IMPLANT
TUBE CONNECTING 12X1/4 (SUCTIONS) ×2 IMPLANT
UNDERPAD 30X36 HEAVY ABSORB (UNDERPADS AND DIAPERS) ×2 IMPLANT
YANKAUER SUCT BULB TIP NO VENT (SUCTIONS) ×2 IMPLANT

## 2022-02-13 NOTE — Anesthesia Preprocedure Evaluation (Signed)
Anesthesia Evaluation  Patient identified by MRN, date of birth, ID band Patient awake    Reviewed: Allergy & Precautions, NPO status , Patient's Chart, lab work & pertinent test results  History of Anesthesia Complications Negative for: history of anesthetic complications  Airway Mallampati: II  TM Distance: >3 FB Neck ROM: Full    Dental  (+) Chipped, Dental Advisory Given   Pulmonary neg pulmonary ROS, former smoker,    Pulmonary exam normal        Cardiovascular negative cardio ROS Normal cardiovascular exam     Neuro/Psych Anxiety Depression negative neurological ROS     GI/Hepatic negative GI ROS, Neg liver ROS,   Endo/Other  negative endocrine ROS  Renal/GU negative Renal ROS  negative genitourinary   Musculoskeletal negative musculoskeletal ROS (+)   Abdominal   Peds  Hematology negative hematology ROS (+)   Anesthesia Other Findings  L knee infection and instability s/p ORIF tibial plateau  Reproductive/Obstetrics                             Anesthesia Physical Anesthesia Plan  ASA: 2  Anesthesia Plan: General   Post-op Pain Management: Tylenol PO (pre-op)* and Toradol IV (intra-op)*   Induction: Intravenous  PONV Risk Score and Plan: 3 and Ondansetron, Dexamethasone, Midazolam and Treatment may vary due to age or medical condition  Airway Management Planned: LMA  Additional Equipment: None  Intra-op Plan:   Post-operative Plan: Extubation in OR  Informed Consent: I have reviewed the patients History and Physical, chart, labs and discussed the procedure including the risks, benefits and alternatives for the proposed anesthesia with the patient or authorized representative who has indicated his/her understanding and acceptance.     Dental advisory given  Plan Discussed with:   Anesthesia Plan Comments:         Anesthesia Quick Evaluation

## 2022-02-13 NOTE — ED Provider Notes (Signed)
[facility] DEPT Provider Note   CSN: [id number] Arrival date & time: 02/12/22  2342     History  Chief Complaint  Patient presents with   Knee Pain   Post-op Problem    Kerry Young is a 56 y.o. female patient is 16 days postoperative ORIF for left comminuted impacted and displaced intra-articular fracture of the tibia.  She presents today for postoperative pain.  He states that her pain has not gotten any worse since her surgery but is not improving despite hydrocodone at home.  Patient refused discharge to SNF and has been bedbound at home, not bending the leg or ambulating in any way.  Requiring assistance to get to and from the restroom and wearing briefs at home.  She presents tonight stating that her pain is intolerable at home though it has not worsened.  Denies any fever or chills.  Also states she has been oozing from her surgical site of the color and nature of this has not changed.  She does state it has been oozing more today than is normal for her.  Patient history was reviewed.  She has history of PTSD, chronic pain syndrome followed with pain management clinic, depression, generalized anxiety disorder, and insomnia.  In addition to the hydrocodone she has been prescribed she also takes Xanax, Lyrica, Seroquel daily.  Discharged with Robaxin prescription as well.  HPI     Home Medications Prior to Admission medications   Medication Sig Start Date End Date Taking? Authorizing Provider  ALPRAZolam Duanne Moron) 1 MG tablet Take 1 tablet (1 mg total) by mouth 3 (three) times daily as needed for anxiety. 01/25/22  Yes Donnal Moat T, PA-C  apixaban (ELIQUIS) 2.5 MG TABS tablet Take 1 tablet (2.5 mg total) by mouth 2 (two) times daily. 01/29/22 03/01/22 Yes McClung, Leary Roca, PA-C  diphenhydrAMINE (BENADRYL) 25 MG tablet Take 25 mg by mouth 3 (three) times daily as needed for allergies.   Yes [provider]  docusate sodium (COLACE) 100 MG capsule  Take 1 capsule (100 mg total) by mouth 2 (two) times daily as needed for mild constipation. 02/21/21  Yes Maczis, Barth Kirks, PA-C  HYDROcodone-acetaminophen (NORCO/VICODIN) 5-325 MG tablet Take 1 tablet by mouth every 6 (six) hours as needed for moderate pain.   Yes [provider]  methocarbamol (ROBAXIN) 500 MG tablet Take 1 tablet (500 mg total) by mouth every 6 (six) hours as needed for muscle spasms. 01/29/22  Yes Corinne Ports, PA-C  Oxycodone HCl 10 MG TABS Take 0.5-1 tablets (5-10 mg total) by mouth every 4 (four) hours as needed for severe pain or moderate pain (5 moderate pain, 10 mg severe pain). 01/29/22  Yes McClung, Sarah A, PA-C  pregabalin (LYRICA) 300 MG capsule Take 300 mg by mouth 2 (two) times daily. 01/25/21  Yes [provider]  QUEtiapine Fumarate (SEROQUEL XR) 150 MG 24 hr tablet Take 1 tablet (150 mg total) by mouth at bedtime. 01/27/22  Yes Hurst, Helene Kelp T, PA-C  Vitamin D, Ergocalciferol, (DRISDOL) 1.25 MG (50000 UNIT) CAPS capsule Take 1 capsule (50,000 Units total) by mouth every 7 (seven) days. 01/29/22  Yes McClung, Sarah A, PA-C  acetaminophen (TYLENOL) 500 MG tablet Take 2 tablets (1,000 mg total) by mouth every 8 (eight) hours as needed for mild pain. Patient not taking: Reported on 02/13/2022 01/29/22   Corinne Ports, PA-C  busPIRone (BUSPAR) 5 MG tablet Take 1 tablet (5 mg total) by mouth 2 (two)  times daily. Patient not taking: Reported on 02/13/2022 02/03/22   Donnal Moat T, PA-C      Allergies    Amitriptyline, Celexa [citalopram], Naproxen, Citalopram, Naproxen, Ondansetron hcl, Other, and Zofran [ondansetron]    Review of Systems   Review of Systems  Musculoskeletal:        Left knee pain, swelling, drainage postoperative setting    Physical Exam Updated Vital Signs BP 128/81 (BP Location: Left Arm)   Pulse 96   Temp 98 F (36.7 C) (Oral)   Resp 16   SpO2 99%  Physical Exam Vitals and nursing note reviewed.  Constitutional:       General: She is in acute distress (Secondary to knee pain. Crying out in pain only when the provider is in the room).     Appearance: She is obese. She is not ill-appearing or toxic-appearing.  HENT:     Head: Normocephalic and atraumatic.     Mouth/Throat:     Mouth: Mucous membranes are moist.     Pharynx: No oropharyngeal exudate or posterior oropharyngeal erythema.  Eyes:     General:        Right eye: No discharge.        Left eye: No discharge.     Extraocular Movements: Extraocular movements intact.     Conjunctiva/sclera: Conjunctivae normal.     Pupils: Pupils are equal, round, and reactive to light.  Cardiovascular:     Rate and Rhythm: Normal rate and regular rhythm.     Pulses: Normal pulses.     Heart sounds: Normal heart sounds.  Pulmonary:     Effort: Pulmonary effort is normal. No respiratory distress.     Breath sounds: Normal breath sounds. No wheezing or rales.  Abdominal:     General: Bowel sounds are normal. There is no distension.     Palpations: Abdomen is soft.     Tenderness: There is no abdominal tenderness. There is no right CVA tenderness, left CVA tenderness, guarding or rebound.  Musculoskeletal:        General: No deformity.     Cervical back: Neck supple.       Legs:     Comments: 2+ DP pulses bilaterally.  Patient refusing to bend the left knee, refusing passive range of motion of the knee as well.  Skin:    General: Skin is warm and dry.     Capillary Refill: Capillary refill takes less than 2 seconds.  Neurological:     General: No focal deficit present.     Mental Status: She is alert and oriented to person, place, and time. Mental status is at baseline.  Psychiatric:        Mood and Affect: Mood normal.     ED Results / Procedures / Treatments   Labs (all labs ordered are listed, but only abnormal results are displayed) Labs Reviewed  CBC WITH DIFFERENTIAL/PLATELET - Abnormal; Notable for the following components:      Result Value    WBC 19.6 (*)    Platelets 538 (*)    Neutro Abs 16.6 (*)    Monocytes Absolute 1.2 (*)    Abs Immature Granulocytes 0.12 (*)    All other components within normal limits  BASIC METABOLIC PANEL - Abnormal; Notable for the following components:   Chloride 114 (*)    CO2 17 (*)    Glucose, Bld 134 (*)    All other components within normal limits  LACTIC ACID, PLASMA  LACTIC  ACID, PLASMA  SEDIMENTATION RATE  C-REACTIVE PROTEIN    EKG None  Radiology DG Knee Left Port  Result Date: 02/13/2022 CLINICAL DATA:  Query postoperative infection. Left knee pain after surgery 3 weeks ago. EXAM: PORTABLE LEFT KNEE - 1-2 VIEW COMPARISON:  02/04/2022 FINDINGS: Postoperative changes with lateral plate and screw fixation of fractures of the left tibial plateau and metaphysis. Residual impaction of fracture fragments with residual displacement of fracture fragments anteriorly. Anterior displacement of fracture fragments is increased since the prior study. Surgical hardware appears intact. A moderate effusion is present. No radiopaque soft tissue foreign bodies or soft tissue gas. IMPRESSION: Postoperative changes with internal fixation of fractures of the left tibial plateau and metaphysis. Displaced fracture fragments anteriorly demonstrate increased displacement since previous study. Moderate left knee effusion. Electronically Signed   By: Lucienne Capers M.D.   On: 02/13/2022 01:34    Procedures Procedures    Medications Ordered in ED Medications  ondansetron (ZOFRAN) injection 4 mg (4 mg Intravenous Patient Refused/Not Given 02/13/22 0147)  vancomycin (VANCOREADY) IVPB 1500 mg/300 mL (has no administration in time range)  vancomycin (VANCOREADY) IVPB 1250 mg/250 mL (has no administration in time range)  HYDROmorphone (DILAUDID) injection 1 mg (1 mg Intravenous Given 02/13/22 0136)  HYDROmorphone (DILAUDID) injection 0.5 mg (0.5 mg Intravenous Given 02/13/22 0354)    ED Course/ Medical Decision  Making/ A&P Clinical Course as of 02/13/22 0409  Fri Feb 13, 2022  0323 Consult to Dr. Doreatha Martin, patient's orthopedic surgeon who is requesting CT Noncon of the left knee for further characterization, IV antibiotics with vancomycin and medical admission to [facility] where he will plan to see the patient.  I appreciate his collaboration in care of this patient. [RS]  0354 Consult to Dr. Velia Meyer, hospitalist was agreeable to admitting this patient to [facility] for IV antibiotics and completion orthopedic consultation.  I appreciate his collaboration in care of this patient [RS]    Clinical Course User Index [RS] Basil Buffin, Gypsy Balsam, PA-C                           Medical Decision Making 56 year old female presents with concern for left knee pain persisting postoperatively.  Afebrile and intake, vital signs otherwise normal.  Cardiopulmonary exam is normal abdominal sounds benign.  Knee exam as above with postoperative findings with scant purulent drainage and mild erythema.  Unable to range the knee secondary to pain.  Concerning for postoperative infection.  Amount and/or Complexity of Data Reviewed Labs: ordered.    Details: CBC with lelukocytosis of 19.6.  Elevated platelets to 538.  BMP unremarkable, ESR CRP pending. Radiology: ordered.  Risk Prescription drug management. Decision regarding hospitalization.   Consult to orthopedics as above.  Patient to be admitted to medicine at [facility] for completion of orthopedic consultation in the morning.  In the meantime will obtain CT imaging per orthopedic request.  IV antibiotics with Vanco per orthopedist.  Kerry Young  voiced understanding of her medical evaluation and treatment plan. Each of their questions answered to their expressed satisfaction.  She is amenable to plan for admission at this time.  This chart was dictated using voice recognition software, Dragon. Despite the best efforts of this provider to proofread and  correct errors, errors may still occur which can change documentation meaning.   Final Clinical Impression(s) / ED Diagnoses Final diagnoses:  Infection of superficial incisional surgical site after procedure, initial encounter  Rx / DC Orders ED Discharge Orders     None         Aura Dials 02/13/22 0410    Fatima Blank, MD 02/13/22 [phone]

## 2022-02-13 NOTE — Plan of Care (Signed)
  Problem: Activity: Goal: Risk for activity intolerance will decrease Outcome: Progressing   Problem: Health Behavior/Discharge Planning: Goal: Ability to manage health-related needs will improve Outcome: Progressing

## 2022-02-13 NOTE — Progress Notes (Signed)
Pharmacy Antibiotic Note  Kerry Young is a 56 y.o. female admitted on 02/12/2022 with evaluation of pain to L knee.Reports having surgery due to fracture 3 weeks ago.  Has "oozing" from surgical site.Pharmacy has been consulted to dose vancomycin for wound infection.  Plan: Vancomycin 1500mg  IV x 1 then 1250mg  q24h (AUC 526.8, Used Scr 0.8) Follow renal function, cultures and clinical course     Temp (24hrs), Avg:97.9 F (36.6 C), Min:97.9 F (36.6 C), Max:97.9 F (36.6 C)  Recent Labs  Lab 02/13/22 0147  WBC 19.6*  CREATININE 0.59  LATICACIDVEN 0.8    Estimated Creatinine Clearance: 79.8 mL/min (by C-G formula based on SCr of 0.59 mg/dL).    Allergies  Allergen Reactions   Amitriptyline Other (See Comments)    Cold sores on mouth, blister on tongue. No facial swelling    Celexa [Citalopram] Nausea And Vomiting    Took it 2 days. Had n/v and migraine   Naproxen Other (See Comments)   Citalopram Nausea And Vomiting   Naproxen Other (See Comments)    UNK Reaction   Ondansetron Hcl Other (See Comments)    Headache  Headache   Other Nausea And Vomiting    ALL ANTIBIOTICS   Zofran [Ondansetron] Other (See Comments)    Antimicrobials this admission: 7/28 vanc >>  Dose adjustments this admission:   Microbiology results:   Thank you for allowing pharmacy to be a part of this patient's care.  02/15/22 RPh 02/13/2022, 3:45 AM

## 2022-02-13 NOTE — Progress Notes (Signed)
Admission from earlier this morning. Briefly, 56 year old F with PMH of recent left tibial plateau fracture s/p ORIF on 7/12 by Dr. [name], generalized anxiety, chronic back pain and vitamin D deficiency, presenting with progressive left knee pain, erythema, swelling and warmth to touch, and admitted for sepsis due to postoperative left knee infection.  CT of the left knee shows progressive depression and displacement of tibial plateau comminution fragments, intact hardware's, nondisplaced proximal fibula fracture, increased swelling and decreased density of soleus muscle raising concern for intramuscular hematoma and/or myositis and small fluid collection overlying proximal gastrocnemius and a small complex appearing knee joint effusion.  Patient was started on IV vancomycin.  She is on Dilaudid for pain control.  Orthopedic surgery consulted and planning to take patient to the OR later today.  During my visit, patient complains back pain, sciatica, left knee pain... She states she has not given her Dilaudid since last night although she just got a dose about 7:45 AM when I reviewed her MAR.  She appears very anxious.  Does not seem to have corresponding autonomic response to the degree of her pain  Objective GENERAL: Appears to be in distress HEENT: MMM.  Vision and hearing grossly intact.  NECK: Supple.  No apparent JVD.  RESP:  No IWOB.  Fair aeration bilaterally. CVS:  RRR. Heart sounds normal.  ABD/GI/GU: BS+. Abd soft, NTND.  MSK/EXT: Significant swelling and erythema in left knee.  Tender to palpation.  SKIN: Left knee erythema.  No apparent drainage. NEURO: Awake and alert. Oriented appropriately.  No apparent focal neuro deficit. PSYCH: Looks very anxious.  Sepsis due to left knee postoperative infection: POA Left tibial plateau fracture Nondisplaced left fibular fracture -Continue IV vancomycin -Continue IV Dilaudid 1 mg every 2 hours as needed for pain -Discontinued Eliquis for  upcoming surgery -She is scheduled for surgery at 12:54 PM with Dr. Zena Amos at [facility] -Continue n.p.o. -IV LR-KCl at 100 cc an hour for 12 hours while n.p.o.  Chronic lower back pain -Pain control as above  Generalized anxiety disorder: Appears very anxious. -Hold home BuSpar and Xanax while n.p.o. -IV Ativan 0.5 mg every 6 hours as needed anxiety while n.p.o.  Vitamin D deficiency: -Resume home vitamin D

## 2022-02-13 NOTE — ED Notes (Signed)
Patient taken to CT.

## 2022-02-13 NOTE — Anesthesia Postprocedure Evaluation (Signed)
Anesthesia Post Note  Patient: CHARON SMEDBERG  Procedure(s) Performed: IRRIGATION AND DEBRIDEMENT EXTREMITY (Left)     Patient location during evaluation: PACU Anesthesia Type: General Level of consciousness: awake and alert Pain management: pain level controlled Vital Signs Assessment: post-procedure vital signs reviewed and stable Respiratory status: spontaneous breathing, nonlabored ventilation and respiratory function stable Cardiovascular status: blood pressure returned to baseline and stable Postop Assessment: no apparent nausea or vomiting Anesthetic complications: no   No notable events documented.  Last Vitals:  Vitals:   02/13/22 1600 02/13/22 1633  BP: 100/70 107/70  Pulse: 88 88  Resp: 20   Temp: 36.7 C 36.6 C  SpO2: 91% 94%    Last Pain:  Vitals:   02/13/22 1633  TempSrc: Oral  PainSc:                  Lucretia Kern

## 2022-02-13 NOTE — Transfer of Care (Signed)
Immediate Anesthesia Transfer of Care Note  Patient: Kerry Young  Procedure(s) Performed: IRRIGATION AND DEBRIDEMENT EXTREMITY (Left)  Patient Location: PACU  Anesthesia Type:General  Level of Consciousness: awake, alert  and oriented  Airway & Oxygen Therapy: Patient Spontanous Breathing and Patient connected to face mask oxygen  Post-op Assessment: Report given to RN, Post -op Vital signs reviewed and stable and Patient moving all extremities  Post vital signs: Reviewed and stable  Last Vitals:  Vitals Value Taken Time  BP 121/77 02/13/22 1502  Temp    Pulse 81 02/13/22 1505  Resp 21 02/13/22 1505  SpO2 94 % 02/13/22 1505  Vitals shown include unvalidated device data.  Last Pain:  Vitals:   02/13/22 1232  TempSrc:   PainSc: 8          Complications: No notable events documented.

## 2022-02-13 NOTE — Consult Note (Signed)
Orthopaedic Trauma Service (OTS) Consult   Patient ID: Kerry Young MRN: [id number] DOB/AGE: 09/12/1965 56 y.o.  Reason for Surgery:Left tibial plateau infection  HPI: Kerry Young is an 56 y.o. female Who presents with a postoperative infection of her left tibial plateau.  The patient underwent open reduction internal fixation of left bicondylar tibial plateau on 01/28/2022.  She subsequently discharged home but had a fall approximately a week after surgery.  She presented to the [facility] emergency room.  X-rays were stable.  She did have some persistent drainage at that point.  I was not aware of this and she had not followed up with me yet.  She presented again yesterday evening with purulent drainage from her wound with worsening pain and displacement on her x-rays.  Due to the concerning finding for an acute postoperative infection I recommended she transfer over to [place] for irrigation debridement with possible surgical stabilization.  Patient was seen and evaluated in the preoperative holding area.  Patient states that she has basically been in bed but questionable whether she has been compliant with weightbearing precautions.  She has not been wearing her hinged knee brace secondary to the difficulty with fitting and the swelling of her leg.  Denies any significant fevers and chills  Past Medical History:  Diagnosis Date   Anxiety    Migraines    PONV (postoperative nausea and vomiting)    PTSD (post-traumatic stress disorder)    Sciatica    UTI (urinary tract infection)     Past Surgical History:  Procedure Laterality Date   COLPOSCOPY N/A 10/26/2019   Procedure: COLPOSCOPY with cervical biopsy;  Surgeon: Ozan, Jennifer, DO;  Location: [facility];  Service: Gynecology;  Laterality: N/A;   ORIF HUMERUS FRACTURE Right 02/07/2021   Procedure: Arthroscopic repair rotator cuff and fixation of PROXIMAL HUMERUS FRACTURE;  Surgeon: Varkey, Dax T, MD;  Location: [facility];   Service: Orthopedics;  Laterality: Right;   ORIF TIBIA PLATEAU Left 01/28/2022   Procedure: OPEN REDUCTION INTERNAL FIXATION (ORIF) TIBIAL PLATEAU;  Surgeon: Nidya Bouyer P, MD;  Location: [facility];  Service: Orthopedics;  Laterality: Left;   ORIF ULNAR FRACTURE Left 02/18/2021   Procedure: OPEN REDUCTION INTERNAL FIXATION (ORIF) ULNAR FRACTURE;  Surgeon: Murphy, Timothy D, MD;  Location: [facility];  Service: Orthopedics;  Laterality: Left;   TIBIA IM NAIL INSERTION Right 02/05/2021   Procedure: INTRAMEDULLARY (IM) NAIL TIBIAL;  Surgeon: Varkey, Dax T, MD;  Location[facility];  Service: Orthopedics;  Laterality: Right;   WISDOM TOOTH EXTRACTION      Family History  Problem Relation Age of Onset   Anxiety disorder Mother    Hypertension Mother    Kidney failure Mother    Heart attack Paternal Grandfather    Healthy Daughter     Social History:  reports that she quit smoking about 3 years ago. Her smoking use included cigarettes. She smoked an average of .15 packs per day. She has never used smokeless tobacco. She reports that she does not currently use alcohol. She reports that she does not use drugs.  Allergies:  Allergies  Allergen Reactions   Amitriptyline Other (See Comments)    Cold sores on mouth, blister on tongue. No facial swelling    Celexa [Citalopram] Nausea And Vomiting    Took it 2 days. Had n/v and migraine   Naproxen Other (See Comments)   Citalopram Nausea And Vomiting   Naproxen Other (See Comments)    UNK   Reaction   Ondansetron Hcl Other (See Comments)    Headache  Headache   Other Nausea And Vomiting    ALL ANTIBIOTICS   Zofran [Ondansetron] Other (See Comments)    Medications: I have reviewed the patient's current medications.  ROS: Constitutional: No fever or chills Vision: No changes in vision ENT: No difficulty swallowing CV: No chest pain Pulm: No SOB or wheezing GI: No nausea or vomiting GU: No urgency or inability to hold urine Skin: No poor wound  healing Neurologic: No numbness or tingling Psychiatric: No depression or anxiety Heme: No bruising Allergic: No reaction to medications or food   Exam: Blood pressure 97/64, pulse (!) 105, temperature 98.2 F (36.8 C), temperature source Oral, resp. rate 18, height 5\' 2"  (1.575 m), weight 83 kg, SpO2 94 %. General: Anxious and awake Orientation: Awake alert and oriented x3 Mood and Affect: Anxious and appropriate to conversation. Gait: Unable to assess due to her injury Coordination and balance: Within normal limits  Left lower extremity: There is a persistent deformity through the lower extremity with purulent active drainage through the wound.  She is unable to move or bend the knee secondary to pain.  There is valgus alignment to her leg.  She does have active dorsiflexion plantarflexion of the foot.  She has sensation intact to light touch.  She has compartments are soft compressible.  Right lower extremity: Skin without lesions. No tenderness to palpation. Full painless ROM, full strength in each muscle groups without evidence of instability.   Medical Decision Making: Data: Imaging: X-rays and CT scan of the brain reviewed which shows displacement of her medial condyle.  Lateral condyle still shows good fixation.  It appears that she is in some recurvatum with rotational instability of her medial condyle.  Labs:  Results for orders placed or performed during the hospital encounter of 02/12/22 (from the past 24 hour(s))  CBC with Differential     Status: Abnormal   Collection Time: 02/13/22  1:47 AM  Result Value Ref Range   WBC 19.6 (H) 4.0 - 10.5 K/uL   RBC 4.07 3.87 - 5.11 MIL/uL   Hemoglobin 12.0 12.0 - 15.0 g/dL   HCT 02/15/22 16.1 - 09.6 %   MCV 91.9 80.0 - 100.0 fL   MCH 29.5 26.0 - 34.0 pg   MCHC 32.1 30.0 - 36.0 g/dL   RDW 04.5 40.9 - 81.1 %   Platelets 538 (H) 150 - 400 K/uL   nRBC 0.0 0.0 - 0.2 %   Neutrophils Relative % 84 %   Neutro Abs 16.6 (H) 1.7 - 7.7 K/uL    Lymphocytes Relative 8 %   Lymphs Abs 1.6 0.7 - 4.0 K/uL   Monocytes Relative 6 %   Monocytes Absolute 1.2 (H) 0.1 - 1.0 K/uL   Eosinophils Relative 1 %   Eosinophils Absolute 0.1 0.0 - 0.5 K/uL   Basophils Relative 0 %   Basophils Absolute 0.0 0.0 - 0.1 K/uL   Immature Granulocytes 1 %   Abs Immature Granulocytes 0.12 (H) 0.00 - 0.07 K/uL   Polychromasia PRESENT    Platelet Morphology LARGE PLATELETS   Basic metabolic panel     Status: Abnormal   Collection Time: 02/13/22  1:47 AM  Result Value Ref Range   Sodium 140 135 - 145 mmol/L   Potassium 3.5 3.5 - 5.1 mmol/L   Chloride 114 (H) 98 - 111 mmol/L   CO2 17 (L) 22 - 32 mmol/L   Glucose, Bld 134 (  H) 70 - 99 mg/dL   BUN 13 6 - 20 mg/dL   Creatinine, Ser 2.95 0.44 - 1.00 mg/dL   Calcium 9.0 8.9 - 28.4 mg/dL   GFR, Estimated >13 >24 mL/min   Anion gap 9 5 - 15  Lactic acid, plasma     Status: None   Collection Time: 02/13/22  1:47 AM  Result Value Ref Range   Lactic Acid, Venous 0.8 0.5 - 1.9 mmol/L  Sedimentation rate     Status: Abnormal   Collection Time: 02/13/22  1:47 AM  Result Value Ref Range   Sed Rate 34 (H) 0 - 22 mm/hr  Magnesium     Status: None   Collection Time: 02/13/22  1:47 AM  Result Value Ref Range   Magnesium 2.1 1.7 - 2.4 mg/dL  Lactic acid, plasma     Status: None   Collection Time: 02/13/22  5:23 AM  Result Value Ref Range   Lactic Acid, Venous 0.9 0.5 - 1.9 mmol/L  C-reactive protein     Status: Abnormal   Collection Time: 02/13/22  5:23 AM  Result Value Ref Range   CRP 24.6 (H) <1.0 mg/dL     Imaging or Labs ordered: None  Medical history and chart was reviewed and case discussed with medical provider.  Assessment/Plan: 56 year old female with postoperative infection and failed fixation of her left tibial plateau fracture.  I discussed her problem with her.  She has a postoperative infection and requires irrigation and debridement.  She also has displacement of the medial condyle.  At  this point her knee appears to be subluxated on both the imaging and on clinical exam.  I discussed with her possibly performing an external fixation with closed reduction however she absolutely refuses and external fixation.  I did discuss with her that I cannot acutely fix the medial side and perform another incision with an active infection.  I would recommend possible close reduction and splinting in addition to the irrigation debridement.  We will do an intraoperative cultures with IV antibiotics with potential delayed medial fixation depending on how she resolves her infection.  She agrees to proceed with surgery and consent was obtained.  Roby Lofts, MD [facility] [phone] (office) [email or web address]

## 2022-02-13 NOTE — Interval H&P Note (Signed)
History and Physical Interval Note:  02/13/2022 1:22 PM  Kerry Young  has presented today for surgery, with the diagnosis of Left knee infection and instability.  The various methods of treatment have been discussed with the patient and family. After consideration of risks, benefits and other options for treatment, the patient has consented to  Procedure(s): IRRIGATION AND DEBRIDEMENT EXTREMITY (Left) POSSIBLE EXTERNAL FIXATION LEG (Left) as a surgical intervention.  The patient's history has been reviewed, patient examined, no change in status, stable for surgery.  I have reviewed the patient's chart and labs.  Questions were answered to the patient's satisfaction.     Caryn Bee P Beverlie Kurihara

## 2022-02-13 NOTE — ED Notes (Signed)
Patient c/o nausea but still refusing zofran.  Patient given alcohol prep to smell.  Patient states that helped her nausea.

## 2022-02-13 NOTE — H&P (Signed)
History and Physical    PLEASE NOTE THAT DRAGON DICTATION SOFTWARE WAS USED IN THE CONSTRUCTION OF THIS NOTE.   Kerry Young UKG:[id number] DOB: [birth year] DOA: 02/12/2022  PCP: Sue Lush, PA-C  Patient coming from: home   I have personally briefly reviewed patient's old medical records in [place]  Chief Complaint: Left knee pain  HPI: Kerry Young is a 56 y.o. female with medical history significant for left tibial plateau fracture status post ORIF on 01/28/2022, generalized anxiety disorder, who is admitted to [facility] on 02/12/2022 with sepsis due to concern for septic left knee after presenting from home to [facility] ED complaining of left knee pain.   In the setting of left tibial plateau fracture, the patient underwent ORIF of the left knee via Dr. Doreatha Martin of Orthopedic surgery on 01/28/2022.  She reports continued compliance with thromboembolic prophylaxis in the form of Eliquis.  However, over the course the last 4 to 5 days, she notes progressive left knee discomfort associated with left knee inflammation, erythema, increased warmth to the touch, diminished range of motion.  She is also noted a small amount of serous drainage from the left knee, in the absence of any associated purulent drainage.  She denies any associated subjective fever, chills, rigors, generalized myalgias.  She describes the left knee pain as sharp, constant, exquisitely tender to even mild palpation.  Denies any trauma to the knee following ORIF on 01/28/2022.   No associated any headache, neck stiffness, additional rash, calf tenderness, shortness of breath, cough, abdominal pain, diarrhea, dysuria.  Denies any recent chest pain, palpitations, diaphoresis, dizziness, presyncope, or syncope.     [facility] ED Course:  Vital signs in the ED were notable for the following: Afebrile; heart rate 89-1 06; blood pressure 121/88; respiratory rate 16-20, oxygen saturation 96 to 97% on room air.  Labs  were notable for the following: BMP notable for the following: Creatinine 0.59.  Lactic acid 0.8.  CBC notable for white blood cell count 19,600 with 84% neutrophils.  ESR/CRP pending.  EDP discussed the patient's case with Dr. Doreatha Martin of Orthopedic surgery, requesting admission to the hospitalist service at [facility] for further evaluation management of concern for left knee infection.  Dr. Doreatha Martin will formally consult upon arrival at [facility], and specifically recommends interval initiation of IV vancomycin.  He also recommends pursuit of CT knee, and will evaluate the patient for the possibility of left knee washout.   Imaging and additional notable ED work-up: CT left knee has been completed, with result currently pending.  While in the ED, the following were administered: Dilaudid 1 mg IV x2 doses, Dilaudid 0.5 mg IV x1 dose, IV vancomycin.  Subsequently, the patient was admitted to [facility] for further evaluation and management of presenting sepsis due to concern for septic left knee.     Review of Systems: As per HPI otherwise 10 point review of systems negative.   Past Medical History:  Diagnosis Date   Anxiety    Migraines    PONV (postoperative nausea and vomiting)    PTSD (post-traumatic stress disorder)    Sciatica    UTI (urinary tract infection)     Past Surgical History:  Procedure Laterality Date   COLPOSCOPY N/A 10/26/2019   Procedure: COLPOSCOPY with cervical biopsy;  Surgeon: Janyth Pupa, DO;  Location: [place];  Service: Gynecology;  Laterality: N/A;   ORIF HUMERUS FRACTURE Right 02/07/2021   Procedure: Arthroscopic repair  rotator cuff and fixation of PROXIMAL HUMERUS FRACTURE;  Surgeon: Hiram Gash, MD;  Location: [place];  Service: Orthopedics;  Laterality: Right;   ORIF TIBIA PLATEAU Left 01/28/2022   Procedure: OPEN REDUCTION INTERNAL FIXATION (ORIF) TIBIAL PLATEAU;  Surgeon: Shona Needles, MD;  Location: [place];  Service: Orthopedics;   Laterality: Left;   ORIF ULNAR FRACTURE Left 02/18/2021   Procedure: OPEN REDUCTION INTERNAL FIXATION (ORIF) ULNAR FRACTURE;  Surgeon: Renette Butters, MD;  Location: [place];  Service: Orthopedics;  Laterality: Left;   TIBIA IM NAIL INSERTION Right 02/05/2021   Procedure: INTRAMEDULLARY (IM) NAIL TIBIAL;  Surgeon: Hiram Gash, MD;  Location: [place];  Service: Orthopedics;  Laterality: Right;   WISDOM TOOTH EXTRACTION      Social History:  reports that she has been smoking cigarettes. She has been smoking an average of .15 packs per day. She has never used smokeless tobacco. She reports that she does not currently use alcohol. She reports that she does not use drugs.   Allergies  Allergen Reactions   Amitriptyline Other (See Comments)    Cold sores on mouth, blister on tongue. No facial swelling    Celexa [Citalopram] Nausea And Vomiting    Took it 2 days. Had n/v and migraine   Naproxen Other (See Comments)   Citalopram Nausea And Vomiting   Naproxen Other (See Comments)    UNK Reaction   Ondansetron Hcl Other (See Comments)    Headache  Headache   Other Nausea And Vomiting    ALL ANTIBIOTICS   Zofran [Ondansetron] Other (See Comments)    Family History  Problem Relation Age of Onset   Anxiety disorder Mother    Hypertension Mother    Kidney failure Mother    Heart attack Paternal Grandfather    Healthy Daughter     Family history reviewed and not pertinent    Prior to Admission medications   Medication Sig Start Date End Date Taking? Authorizing Provider  ALPRAZolam Duanne Moron) 1 MG tablet Take 1 tablet (1 mg total) by mouth 3 (three) times daily as needed for anxiety. 01/25/22  Yes Donnal Moat T, PA-C  apixaban (ELIQUIS) 2.5 MG TABS tablet Take 1 tablet (2.5 mg total) by mouth 2 (two) times daily. 01/29/22 03/01/22 Yes McClung, Leary Roca, PA-C  diphenhydrAMINE (BENADRYL) 25 MG tablet Take 25 mg by mouth 3 (three) times daily as needed for allergies.   Yes [provider]  docusate sodium (COLACE) 100 MG capsule Take 1 capsule (100 mg total) by mouth 2 (two) times daily as needed for mild constipation. 02/21/21  Yes Maczis, Barth Kirks, PA-C  HYDROcodone-acetaminophen (NORCO/VICODIN) 5-325 MG tablet Take 1 tablet by mouth every 6 (six) hours as needed for moderate pain.   Yes [provider]  methocarbamol (ROBAXIN) 500 MG tablet Take 1 tablet (500 mg total) by mouth every 6 (six) hours as needed for muscle spasms. 01/29/22  Yes Corinne Ports, PA-C  Oxycodone HCl 10 MG TABS Take 0.5-1 tablets (5-10 mg total) by mouth every 4 (four) hours as needed for severe pain or moderate pain (5 moderate pain, 10 mg severe pain). 01/29/22  Yes McClung, Sarah A, PA-C  pregabalin (LYRICA) 300 MG capsule Take 300 mg by mouth 2 (two) times daily. 01/25/21  Yes [provider]  QUEtiapine Fumarate (SEROQUEL XR) 150 MG 24 hr tablet Take 1 tablet (150 mg total) by mouth at bedtime. 01/27/22  Yes Addison Lank, PA-C  Vitamin D,  Ergocalciferol, (DRISDOL) 1.25 MG (50000 UNIT) CAPS capsule Take 1 capsule (50,000 Units total) by mouth every 7 (seven) days. 01/29/22  Yes McClung, Sarah A, PA-C  acetaminophen (TYLENOL) 500 MG tablet Take 2 tablets (1,000 mg total) by mouth every 8 (eight) hours as needed for mild pain. Patient not taking: Reported on 02/13/2022 01/29/22   Corinne Ports, PA-C  busPIRone (BUSPAR) 5 MG tablet Take 1 tablet (5 mg total) by mouth 2 (two) times daily. Patient not taking: Reported on 02/13/2022 02/03/22   Addison Lank, PA-C     Objective    Physical Exam: Vitals:   02/13/22 0115 02/13/22 0310 02/13/22 0330 02/13/22 0356  BP: (!) 104/91 128/81 (!) 135/101   Pulse: (!) 106 96 95   Resp: _0 Temp:    98 F (36.7 C)  TempSrc:    Oral  SpO2: 97% 99% 96%     General: appears to be stated age; alert, oriented Skin: warm, dry, erythema over the left knee  head:  AT/[place] Mouth:  Oral mucosa membranes appear moist, normal dentition Neck:  supple; trachea midline Heart:  RRR; did not appreciate any M/R/G Lungs: CTAB, did not appreciate any wheezes, rales, or rhonchi Abdomen: + BS; soft, ND, NT Vascular: 2+ pedal pulses b/l; 2+ radial pulses b/l Extremities: Gross swelling of the left knee relative to the right counterpart associate with erythema, increased warmth to touch, tenderness, Neuro:  sensation intact in upper and lower extremities b/l; refrain from assessment of strength of the left lower extremity given the associated acute left knee discomfort.      Labs on Admission: I have personally reviewed following labs and imaging studies  CBC: Recent Labs  Lab 02/13/22 0147  WBC 19.6*  NEUTROABS 16.6*  HGB 12.0  HCT 37.4  MCV 91.9  PLT 295*   Basic Metabolic Panel: Recent Labs  Lab 02/13/22 0147  NA 140  K 3.5  CL 114*  CO2 17*  GLUCOSE 134*  BUN 13  CREATININE 0.59  CALCIUM 9.0   GFR: Estimated Creatinine Clearance: 79.8 mL/min (by C-G formula based on SCr of 0.59 mg/dL). Liver Function Tests: No results for input(s): "AST", "ALT", "ALKPHOS", "BILITOT", "PROT", "ALBUMIN" in the last 168 hours. No results for input(s): "LIPASE", "AMYLASE" in the last 168 hours. No results for input(s): "AMMONIA" in the last 168 hours. Coagulation Profile: No results for input(s): "INR", "PROTIME" in the last 168 hours. Cardiac Enzymes: No results for input(s): "CKTOTAL", "CKMB", "CKMBINDEX", "TROPONINI" in the last 168 hours. BNP (last 3 results) No results for input(s): "PROBNP" in the last 8760 hours. HbA1C: No results for input(s): "HGBA1C" in the last 72 hours. CBG: No results for input(s): "GLUCAP" in the last 168 hours. Lipid Profile: No results for input(s): "CHOL", "HDL", "LDLCALC", "TRIG", "CHOLHDL", "LDLDIRECT" in the last 72 hours. Thyroid Function Tests: No results for input(s): "TSH", "T4TOTAL", "FREET4", "T3FREE", "THYROIDAB" in the last 72 hours. Anemia Panel: No results for input(s):  "VITAMINB12", "FOLATE", "FERRITIN", "TIBC", "IRON", "RETICCTPCT" in the last 72 hours. Urine analysis:    Component Value Date/Time   COLORURINE YELLOW 02/04/2021 1620   APPEARANCEUR HAZY (A) 02/04/2021 1620   LABSPEC >1.046 (H) 02/04/2021 1620   PHURINE 5.0 02/04/2021 1620   GLUCOSEU NEGATIVE 02/04/2021 1620   HGBUR MODERATE (A) 02/04/2021 1620   BILIRUBINUR NEGATIVE 02/04/2021 1620   KETONESUR 20 (A) 02/04/2021 1620   PROTEINUR NEGATIVE 02/04/2021 1620   UROBILINOGEN 0.2 05/06/2015 1422   NITRITE NEGATIVE  02/04/2021 1620   LEUKOCYTESUR NEGATIVE 02/04/2021 1620    Radiological Exams on Admission: DG Knee Left Port  Result Date: 02/13/2022 CLINICAL DATA:  Query postoperative infection. Left knee pain after surgery 3 weeks ago. EXAM: PORTABLE LEFT KNEE - 1-2 VIEW COMPARISON:  02/04/2022 FINDINGS: Postoperative changes with lateral plate and screw fixation of fractures of the left tibial plateau and metaphysis. Residual impaction of fracture fragments with residual displacement of fracture fragments anteriorly. Anterior displacement of fracture fragments is increased since the prior study. Surgical hardware appears intact. A moderate effusion is present. No radiopaque soft tissue foreign bodies or soft tissue gas. IMPRESSION: Postoperative changes with internal fixation of fractures of the left tibial plateau and metaphysis. Displaced fracture fragments anteriorly demonstrate increased displacement since previous study. Moderate left knee effusion. Electronically Signed   By: Lucienne Capers M.D.   On: 02/13/2022 01:34      Assessment/Plan    Principal Problem:   Septic joint of left knee joint (HCC) Active Problems:   GAD (generalized anxiety disorder)   Sepsis ([place])      #) Sepsis due to concern for septic left knee: In the context of recent left tibial plateau fracture, postop day 17 status post ORIF of left knee, presentation concerning for inflammatory arthritis in the form  of 4 days of progressive left knee discomfort associated with new onset erythema, gross swelling, exquisite tenderness to palpation, diminished range of motion, and WBC of 20,000, which is increased from immediately postoperative value.  Of additional note, there has been a small mount of serous drainage from the left knee.  CRP/ESR currently pending.  Additional SIRS criteria met via tachycardia. Of note, in the absence of any evidence of end organ damage, including a non-elevated presenting LA of 0.8, pt's sepsis does not meet criteria to be considered severe in nature. Also, in the absence of LA level greater than or equal to 4.0, and in the absence of any associated hypotension refractory to IVF's, there are no indications for administration of a 30 mL/kg IVF bolus at this time.   EDP discussed the patient's case with Dr. Doreatha Martin of Orthopedic surgery, requesting admission to the hospitalist service at [facility] for further evaluation management of concern for left knee infection.  Dr. Doreatha Martin will formally consult upon arrival at [facility], and specifically recommends interval initiation of IV vancomycin.  He also recommends pursuit of CT knee, and will evaluate the patient for the possibility of left knee washout.   We will check urinalysis to further assess for any additional sources of infection.  No associated acute respiratory symptoms to warrant evaluation a CXR at this point.  IV vancomycin initiated in the ED at [facility], as above.   Plan: CBC w/ diff and CMP in AM. Abx: Continue IV vancomycin.  Orthopedic surgery consulted, as above, and plan to see patient upon arrival at [facility].  Follow-up results of ESR/CRP.  Follow-up results of CT left knee.  Prn IV Dilaudid.  Eliquis to be resumed following evaluation by orthopedic surgery to determine necessity of surgical intervention, including the possibility of washout of left knee.  N.p.o. for now.  Check urinalysis, as  above.          #) Generalized anxiety disorder: Documented history of such, on as needed Xanax as an outpatient.   Plan: Currently holding prn Xanax in the setting of n.p.o. status, with consideration for initiation of prn IV Ativan.        DVT  prophylaxis: Eliquis to resumed following ortho eval, as above.  Code Status: Full code Family Communication: none Disposition Plan: Per Rounding Team Consults called: EDP discussed patient's case with orthopedic surgery, Dr. Doreatha Martin , Who will formally consult, as further detailed above;  Admission status: Inpatient; [place]   PLEASE NOTE THAT DRAGON DICTATION SOFTWARE WAS USED IN THE CONSTRUCTION OF THIS NOTE.   [place] DO [facility] [place]   02/13/2022, 4:28 AM

## 2022-02-13 NOTE — H&P (View-Only) (Signed)
Orthopaedic Trauma Service (OTS) Consult   Patient ID: Kerry Young MRN: [id number] DOB/AGE: 56-29-1967 56 y.o.  Reason for Surgery:Left tibial plateau infection  HPI: Kerry Young is an 56 y.o. female Who presents with a postoperative infection of her left tibial plateau.  The patient underwent open reduction internal fixation of left bicondylar tibial plateau on 01/28/2022.  She subsequently discharged home but had a fall approximately a week after surgery.  She presented to the [facility] emergency room.  X-rays were stable.  She did have some persistent drainage at that point.  I was not aware of this and she had not followed up with me yet.  She presented again yesterday evening with purulent drainage from her wound with worsening pain and displacement on her x-rays.  Due to the concerning finding for an acute postoperative infection I recommended she transfer over to [facility] for irrigation debridement with possible surgical stabilization.  Patient was seen and evaluated in the preoperative holding area.  Patient states that she has basically been in bed but questionable whether she has been compliant with weightbearing precautions.  She has not been wearing her hinged knee brace secondary to the difficulty with fitting and the swelling of her leg.  Denies any significant fevers and chills  Past Medical History:  Diagnosis Date   Anxiety    Migraines    PONV (postoperative nausea and vomiting)    PTSD (post-traumatic stress disorder)    Sciatica    UTI (urinary tract infection)     Past Surgical History:  Procedure Laterality Date   COLPOSCOPY N/A 10/26/2019   Procedure: COLPOSCOPY with cervical biopsy;  Surgeon: Myna Hidalgo, DO;  Location: [facility];  Service: Gynecology;  Laterality: N/A;   ORIF HUMERUS FRACTURE Right 02/07/2021   Procedure: Arthroscopic repair rotator cuff and fixation of PROXIMAL HUMERUS FRACTURE;  Surgeon: Bjorn Pippin, MD;  Location: [facility] OR;   Service: Orthopedics;  Laterality: Right;   ORIF TIBIA PLATEAU Left 01/28/2022   Procedure: OPEN REDUCTION INTERNAL FIXATION (ORIF) TIBIAL PLATEAU;  Surgeon: Roby Lofts, MD;  Location: [facility] OR;  Service: Orthopedics;  Laterality: Left;   ORIF ULNAR FRACTURE Left 02/18/2021   Procedure: OPEN REDUCTION INTERNAL FIXATION (ORIF) ULNAR FRACTURE;  Surgeon: Sheral Apley, MD;  Location: [facility] OR;  Service: Orthopedics;  Laterality: Left;   TIBIA IM NAIL INSERTION Right 02/05/2021   Procedure: INTRAMEDULLARY (IM) NAIL TIBIAL;  Surgeon: Bjorn Pippin, MD;  Location: [facility] OR;  Service: Orthopedics;  Laterality: Right;   WISDOM TOOTH EXTRACTION      Family History  Problem Relation Age of Onset   Anxiety disorder Mother    Hypertension Mother    Kidney failure Mother    Heart attack Paternal Grandfather    Healthy Daughter     Social History:  reports that she quit smoking about 3 years ago. Her smoking use included cigarettes. She smoked an average of .15 packs per day. She has never used smokeless tobacco. She reports that she does not currently use alcohol. She reports that she does not use drugs.  Allergies:  Allergies  Allergen Reactions   Amitriptyline Other (See Comments)    Cold sores on mouth, blister on tongue. No facial swelling    Celexa [Citalopram] Nausea And Vomiting    Took it 2 days. Had n/v and migraine   Naproxen Other (See Comments)   Citalopram Nausea And Vomiting   Naproxen Other (See Comments)    UNK  Reaction   Ondansetron Hcl Other (See Comments)    Headache  Headache   Other Nausea And Vomiting    ALL ANTIBIOTICS   Zofran [Ondansetron] Other (See Comments)    Medications: I have reviewed the patient's current medications.  ROS: Constitutional: No fever or chills Vision: No changes in vision ENT: No difficulty swallowing CV: No chest pain Pulm: No SOB or wheezing GI: No nausea or vomiting GU: No urgency or inability to hold urine Skin: No poor wound  healing Neurologic: No numbness or tingling Psychiatric: No depression or anxiety Heme: No bruising Allergic: No reaction to medications or food   Exam: Blood pressure 97/64, pulse (!) 105, temperature 98.2 F (36.8 C), temperature source Oral, resp. rate 18, height 5\' 2"  (1.575 m), weight 83 kg, SpO2 94 %. General: Anxious and awake Orientation: Awake alert and oriented x3 Mood and Affect: Anxious and appropriate to conversation. Gait: Unable to assess due to her injury Coordination and balance: Within normal limits  Left lower extremity: There is a persistent deformity through the lower extremity with purulent active drainage through the wound.  She is unable to move or bend the knee secondary to pain.  There is valgus alignment to her leg.  She does have active dorsiflexion plantarflexion of the foot.  She has sensation intact to light touch.  She has compartments are soft compressible.  Right lower extremity: Skin without lesions. No tenderness to palpation. Full painless ROM, full strength in each muscle groups without evidence of instability.   Medical Decision Making: Data: Imaging: X-rays and CT scan of the brain reviewed which shows displacement of her medial condyle.  Lateral condyle still shows good fixation.  It appears that she is in some recurvatum with rotational instability of her medial condyle.  Labs:  Results for orders placed or performed during the hospital encounter of 02/12/22 (from the past 24 hour(s))  CBC with Differential     Status: Abnormal   Collection Time: 02/13/22  1:47 AM  Result Value Ref Range   WBC 19.6 (H) 4.0 - 10.5 K/uL   RBC 4.07 3.87 - 5.11 MIL/uL   Hemoglobin 12.0 12.0 - 15.0 g/dL   HCT 02/15/22 16.1 - 09.6 %   MCV 91.9 80.0 - 100.0 fL   MCH 29.5 26.0 - 34.0 pg   MCHC 32.1 30.0 - 36.0 g/dL   RDW 04.5 40.9 - 81.1 %   Platelets 538 (H) 150 - 400 K/uL   nRBC 0.0 0.0 - 0.2 %   Neutrophils Relative % 84 %   Neutro Abs 16.6 (H) 1.7 - 7.7 K/uL    Lymphocytes Relative 8 %   Lymphs Abs 1.6 0.7 - 4.0 K/uL   Monocytes Relative 6 %   Monocytes Absolute 1.2 (H) 0.1 - 1.0 K/uL   Eosinophils Relative 1 %   Eosinophils Absolute 0.1 0.0 - 0.5 K/uL   Basophils Relative 0 %   Basophils Absolute 0.0 0.0 - 0.1 K/uL   Immature Granulocytes 1 %   Abs Immature Granulocytes 0.12 (H) 0.00 - 0.07 K/uL   Polychromasia PRESENT    Platelet Morphology LARGE PLATELETS   Basic metabolic panel     Status: Abnormal   Collection Time: 02/13/22  1:47 AM  Result Value Ref Range   Sodium 140 135 - 145 mmol/L   Potassium 3.5 3.5 - 5.1 mmol/L   Chloride 114 (H) 98 - 111 mmol/L   CO2 17 (L) 22 - 32 mmol/L   Glucose, Bld 134 (  H) 70 - 99 mg/dL   BUN 13 6 - 20 mg/dL   Creatinine, Ser 2.95 0.44 - 1.00 mg/dL   Calcium 9.0 8.9 - 28.4 mg/dL   GFR, Estimated >13 >24 mL/min   Anion gap 9 5 - 15  Lactic acid, plasma     Status: None   Collection Time: 02/13/22  1:47 AM  Result Value Ref Range   Lactic Acid, Venous 0.8 0.5 - 1.9 mmol/L  Sedimentation rate     Status: Abnormal   Collection Time: 02/13/22  1:47 AM  Result Value Ref Range   Sed Rate 34 (H) 0 - 22 mm/hr  Magnesium     Status: None   Collection Time: 02/13/22  1:47 AM  Result Value Ref Range   Magnesium 2.1 1.7 - 2.4 mg/dL  Lactic acid, plasma     Status: None   Collection Time: 02/13/22  5:23 AM  Result Value Ref Range   Lactic Acid, Venous 0.9 0.5 - 1.9 mmol/L  C-reactive protein     Status: Abnormal   Collection Time: 02/13/22  5:23 AM  Result Value Ref Range   CRP 24.6 (H) <1.0 mg/dL     Imaging or Labs ordered: None  Medical history and chart was reviewed and case discussed with medical provider.  Assessment/Plan: 56 year old female with postoperative infection and failed fixation of her left tibial plateau fracture.  I discussed her problem with her.  She has a postoperative infection and requires irrigation and debridement.  She also has displacement of the medial condyle.  At  this point her knee appears to be subluxated on both the imaging and on clinical exam.  I discussed with her possibly performing an external fixation with closed reduction however she absolutely refuses and external fixation.  I did discuss with her that I cannot acutely fix the medial side and perform another incision with an active infection.  I would recommend possible close reduction and splinting in addition to the irrigation debridement.  We will do an intraoperative cultures with IV antibiotics with potential delayed medial fixation depending on how she resolves her infection.  She agrees to proceed with surgery and consent was obtained.  Roby Lofts, MD [facility] [phone] (office) [email or web address]

## 2022-02-13 NOTE — Op Note (Signed)
Orthopaedic Surgery Operative Note (CSN: [id number] ) Date of Surgery: 02/13/2022  Admit Date: 02/12/2022   Diagnoses: Pre-Op Diagnoses: Left tibial plateau postoperative infection Left knee instability  Post-Op Diagnosis: Same  Procedures: CPT 27310-Incision and drainage of left septic knee CPT 10180-Irrigation and debridement of left postoperative infection CPT 27552-Closed reduction and splinting of left knee  Surgeons : Primary: Roby Lofts, MD  Assistant: Darron Doom, RNFA  Location: OR 3   Anesthesia:General   Antibiotics: Vancomycin IV and 1 gm vancomycin powder and 1.2 g of tobramycin powder placed topically   Tourniquet time: None    Estimated Blood Loss:50 mL  Complications:None   Specimens: ID Type Source Tests Collected by Time Destination  A : Left Knee Infection one Tissue PATH Soft tissue AEROBIC/ANAEROBIC CULTURE W GRAM STAIN (SURGICAL/DEEP WOUND) Roby Lofts, MD 02/13/2022 1326   B : Left Knee Infection two Tissue PATH Soft tissue AEROBIC/ANAEROBIC CULTURE W GRAM STAIN (SURGICAL/DEEP WOUND) Roby Lofts, MD 02/13/2022 1402      Implants: * No implants in log *   Indications for Surgery: 56 year old female who sustained a left bicondylar tibial plateau fracture and underwent open reduction internal fixation on 01/28/2022.  She subsequently had a fall a week later and had persistent drainage to her wound.  She eventually returned to the emergency room with purulent drainage from her wound.  She had increased amount of pain with instability findings on her knee radiographs as well as CT scan.  I recommend proceeding with irrigation debridement with possible external fixation placement.  After discussion of full risks and benefits she refused external fixation placement.  I recommended to then a closed reduction and splinting.  She agreed to proceed with surgery and consent was obtained.  Operative Findings: 1.  Complex postoperative infection  surrounding hardware fracture and causing damage to the underlying patellar tendon that extended over to the medial side of the proximal tibia.  Treated with irrigation and debridement 2.  Septic arthritis treated with formal arthrotomy approach and debridement and irrigation. 3.  Left knee instability with medial condyle failure treated with closed reduction and splinting using a plaster splint.  Procedure: The patient was identified in the preoperative holding area. Consent was confirmed with the patient and their family and all questions were answered. The operative extremity was marked after confirmation with the patient. she was then brought back to the operating room by our anesthesia colleagues.  She was carefully transferred over to radiolucent flat top table.  She was placed under general anesthetic.  Left lower extremity was prepped and draped in usual sterile fashion and timeout was performed to verify the patient, the procedure, and the extremity.  Preoperative antibiotics and already been given.  I incised through her previous surgical incision and encountered significant large amount of purulent serous fluid.  I sent 2 cultures to the lab to assess for speciation.  It extended from the tibial plateau hardware across the anterior portion of the tibia into the medial side and encompassed the medial condyle as well.  I then decided to make a arthrotomy above the meniscus in the lateral knee.  Here I encountered murky fluid that appeared to have an infection as well.  Debrided using a ronguer and Cobb elevator.  I then used a low-pressure pulsatile lavage to thoroughly irrigate the wound and knee saline.  I then changed gloves and instruments and proceeded to closed the arthrotomy with 2-0 Monocryl suture.  A large Hemovac drain was placed and  brought through the anterior skin.  I then placed a gram of vancomycin powder 1.2 g of tobramycin powder.  I used 3-0 nylon in interrupted fashion to close  the skin.  I then placed a sterile dressing consisting of Mepitel, 4 x 4's and sterile cast padding.  I then obtained fluoroscopic imaging and flex knee approximately 10 degrees reduced the knee and then proceeded to place a well-padded long-leg splint to the lower extremity.  Patient was then awoken from anesthesia and taken to the PACU in stable condition.   Debridement type: Excisional Debridement  Side: left  Body Location: Knee  Tools used for debridement: scalpel, curette, and rongeur  Pre-debridement Wound size (cm):   N/a-Surgical incision  Post-debridement Wound size (cm):  N/A-closed  Debridement depth beyond dead/damaged tissue down to healthy viable tissue: yes  Tissue layer involved: skin, subcutaneous tissue, muscle / fascia  Nature of tissue removed: Slough, Necrotic, Non-viable tissue, and Purulence  Irrigation volume: 6L     Irrigation fluid type: Normal Saline   Post Op Plan/Instructions: Patient will be nonweightbearing to left lower extremity.  She will be placed on broad-spectrum antibiotic's.  I will recommend a infectious disease consult to assist with antibiotic regiment.  She will need to be suppressed until her fracture can heal.  Will await culture results.  Need to determine whether we will proceed with further definitive fixation or mobilize her till her knee becomes more stable.  She will receive Eliquis for DVT prophylaxis.  I was present and performed the entire surgery.  Ulyses Southward, PA-C did assist me throughout the case. An assistant was necessary given the difficulty in approach, maintenance of reduction and ability to instrument the fracture.   Truitt Merle, MD [facility]

## 2022-02-13 NOTE — Progress Notes (Signed)
Ortho Trauma Note  I have been made aware of patient. Will ask that CT scan be obtained of knee. Will keep patient npo and transfer to [facility]. Likely will need I&D of knee pending scan results later today.   Truitt Merle, MD [facility] [phone]

## 2022-02-14 ENCOUNTER — Encounter (HOSPITAL_COMMUNITY): Payer: Self-pay | Admitting: Student

## 2022-02-14 DIAGNOSIS — G894 Chronic pain syndrome: Secondary | ICD-10-CM | POA: Diagnosis not present

## 2022-02-14 DIAGNOSIS — M00062 Staphylococcal arthritis, left knee: Secondary | ICD-10-CM

## 2022-02-14 DIAGNOSIS — T847XXA Infection and inflammatory reaction due to other internal orthopedic prosthetic devices, implants and grafts, initial encounter: Secondary | ICD-10-CM | POA: Diagnosis not present

## 2022-02-14 DIAGNOSIS — E669 Obesity, unspecified: Secondary | ICD-10-CM

## 2022-02-14 DIAGNOSIS — F411 Generalized anxiety disorder: Secondary | ICD-10-CM | POA: Diagnosis not present

## 2022-02-14 LAB — CK: Total CK: 15 U/L — ABNORMAL LOW (ref 38–234)

## 2022-02-14 LAB — BASIC METABOLIC PANEL
Anion gap: 9 (ref 5–15)
BUN: 12 mg/dL (ref 6–20)
CO2: 20 mmol/L — ABNORMAL LOW (ref 22–32)
Calcium: 9.1 mg/dL (ref 8.9–10.3)
Chloride: 107 mmol/L (ref 98–111)
Creatinine, Ser: 0.59 mg/dL (ref 0.44–1.00)
GFR, Estimated: >60 mL/min (ref >60)
Glucose, Bld: 119 mg/dL — ABNORMAL HIGH (ref 70–99)
Potassium: 3.6 mmol/L (ref 3.5–5.1)
Sodium: 136 mmol/L (ref 135–145)

## 2022-02-14 LAB — CBC
HCT: 32.1 % — ABNORMAL LOW (ref 36.0–46.0)
Hemoglobin: 10.6 g/dL — ABNORMAL LOW (ref 12.0–15.0)
MCH: 29.8 pg (ref 26.0–34.0)
MCHC: 33 g/dL (ref 30.0–36.0)
MCV: 90.2 fL (ref 80.0–100.0)
Platelets: 471 10*3/uL — ABNORMAL HIGH (ref 150–400)
RBC: 3.56 MIL/uL — ABNORMAL LOW (ref 3.87–5.11)
RDW: 14.6 % (ref 11.5–15.5)
WBC: 16.8 10*3/uL — ABNORMAL HIGH (ref 4.0–10.5)
nRBC: 0 % (ref 0.0–0.2)

## 2022-02-14 LAB — SEDIMENTATION RATE: Sed Rate: 62 mm/hr — ABNORMAL HIGH (ref 0–22)

## 2022-02-14 LAB — C-REACTIVE PROTEIN: CRP: 23.2 mg/dL — ABNORMAL HIGH (ref <1.0)

## 2022-02-14 LAB — HEPATITIS B SURFACE ANTIGEN: Hepatitis B Surface Ag: NONREACTIVE

## 2022-02-14 MED ORDER — QUETIAPINE FUMARATE ER 50 MG PO TB24
150.0000 mg | ORAL_TABLET | Freq: Every day | ORAL | Status: DC
  Administered 2022-02-14 – 2022-02-16 (×2): 150 mg via ORAL
  Administered 2022-02-17: 100 mg via ORAL
  Administered 2022-02-18 – 2022-02-23 (×6): 150 mg via ORAL
  Filled 2022-02-14 (×12): qty 3

## 2022-02-14 MED ORDER — SODIUM CHLORIDE 0.9 % IV SOLN
600.0000 mg | Freq: Every day | INTRAVENOUS | Status: DC
  Administered 2022-02-14 – 2022-02-22 (×8): 600 mg via INTRAVENOUS
  Filled 2022-02-14 (×10): qty 12

## 2022-02-14 MED ORDER — ALPRAZOLAM 0.5 MG PO TABS
1.0000 mg | ORAL_TABLET | Freq: Three times a day (TID) | ORAL | Status: DC | PRN
  Administered 2022-02-14 – 2022-02-18 (×11): 1 mg via ORAL
  Filled 2022-02-14 (×11): qty 2

## 2022-02-14 MED ORDER — SODIUM CHLORIDE 0.9 % IV SOLN
600.0000 mg | Freq: Every day | INTRAVENOUS | Status: DC
  Filled 2022-02-14 (×2): qty 12

## 2022-02-14 NOTE — Consult Note (Addendum)
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   Kerry Young has an appointment on 03/12/2022 at 1115 with Dr. Tommy Medal at :  [facility] which is located in the [facility] at  [address] in [place].  [address], which is located to the left of the elevators.  Phone: [phone]  Fax: [phone]  [email or web address]  She should arrive 30 minutes prior to her appointment.    Principal Problem:   Septic joint of left knee joint (HCC) Active Problems:   GAD (generalized anxiety disorder)   Obesity (BMI 30-39.9)   Chronic pain disorder   Scheduled Meds:  ondansetron (ZOFRAN) IV  4 mg Intravenous Once   pregabalin  300 mg Oral BID   Continuous Infusions:  cefTRIAXone (ROCEPHIN)  IV 2 g (02/13/22 1703)   vancomycin 1,250 mg (02/14/22 0342)   PRN Meds:.acetaminophen **OR** acetaminophen, HYDROmorphone (DILAUDID) injection, naLOXone (NARCAN)  injection  HPI: Kerry Young is a 56 y.o. female who had a left bicondylar tibial plateau fracture underwent open reduction internal fixation on January 28, 2022.  The week afterwards she fell from her wheelchair to the ground and had persistent drainage from her wound.  She  sought out care in the ER but says she was just given a pill and sent back home.  She ultimately came back to the ER yet again with increasing pain and instability on plain films as well as CT scan.  She was seen by Dr. Doreatha Martin who brought her to the operating room she had received antecedent antibiotics but he was able to take cultures in the operating room.  He performed I&D of the left septic knee as well as I&D of a complicated left postoperative infection around hardware near fracture and patellar tendon that extended on the medial side of the proximal tibia her left knee instability was treated with closed reduction and splinting.  She does still have hardware.  Cultures have separately grown Staphylococcus aureus.  I am changing her over to daptomycin pending susceptibility data on her staphylococcal septic joint and hardware associated infection.  I spent 83 minutes with the patient including than 50% of the time in face to face counseling of the patient regarding her staphylococcal septic arthritis and hardware associated infection along with personally reviewing films CT scan along with review of medical records in preparation for the visit and during the visit and in coordination of her care.    Review of Systems: Review of Systems  Constitutional:  Negative for chills, diaphoresis, fever, malaise/fatigue and weight loss.  HENT:  Negative for congestion, hearing loss, sore throat and tinnitus.   Eyes:  Negative for blurred vision, double vision and photophobia.  Respiratory:  Negative for cough, sputum production, shortness of breath and  wheezing.   Cardiovascular:  Negative for chest pain, palpitations and leg swelling.  Gastrointestinal:  Negative for abdominal pain, blood in stool, constipation, diarrhea, heartburn, melena, nausea and vomiting.  Genitourinary:  Negative for dysuria, flank pain and hematuria.  Musculoskeletal:  Positive for joint pain and myalgias. Negative for back pain  and falls.  Skin:  Negative for itching and rash.  Neurological:  Negative for dizziness, sensory change, focal weakness, loss of consciousness, weakness and headaches.  Endo/Heme/Allergies:  Does not bruise/bleed easily.  Psychiatric/Behavioral:  Negative for depression, memory loss and suicidal ideas. The patient is not nervous/anxious and does not have insomnia.     Past Medical History:  Diagnosis Date   Anxiety    Migraines    PONV (postoperative nausea and vomiting)    PTSD (post-traumatic stress disorder)    Sciatica    UTI (urinary tract infection)     Social History   Tobacco Use   Smoking status: Former    Packs/day: 0.15    Types: Cigarettes    Quit date: 07/11/2018    Years since quitting: 3.6   Smokeless tobacco: Never  Vaping Use   Vaping Use: Never used  Substance Use Topics   Alcohol use: Not Currently    Comment: 1 per year maybe.   Drug use: No    Family History  Problem Relation Age of Onset   Anxiety disorder Mother    Hypertension Mother    Kidney failure Mother    Heart attack Paternal Grandfather    Healthy Daughter    Allergies  Allergen Reactions   Amitriptyline Other (See Comments)    Cold sores on mouth, blister on tongue. No facial swelling    Celexa [Citalopram] Nausea And Vomiting    Took it 2 days. Had n/v and migraine   Naproxen Other (See Comments)   Citalopram Nausea And Vomiting   Naproxen Other (See Comments)    UNK Reaction   Ondansetron Hcl Other (See Comments)    Headache  Headache   Other Nausea And Vomiting    ALL ANTIBIOTICS   Zofran [Ondansetron] Other (See Comments)    OBJECTIVE: Blood pressure 121/74, pulse (!) 109, temperature 98.8 F (37.1 C), temperature source Oral, resp. rate 17, height 5\' 2"  (1.575 m), weight 83 kg, SpO2 97 %.  Physical Exam Constitutional:      General: She is not in acute distress.    Appearance: Normal appearance. She is well-developed. She is not ill-appearing or diaphoretic.   HENT:     Head: Normocephalic and atraumatic.     Right Ear: Hearing and external ear normal.     Left Ear: Hearing and external ear normal.     Nose: No nasal deformity or rhinorrhea.  Eyes:     General: No scleral icterus.    Conjunctiva/sclera: Conjunctivae normal.     Right eye: Right conjunctiva is not injected.     Left eye: Left conjunctiva is not injected.     Pupils: Pupils are equal, round, and reactive to light.  Neck:     Vascular: No JVD.  Cardiovascular:     Rate and Rhythm: Normal rate and regular rhythm.     Heart sounds: S1 normal and S2 normal.  Pulmonary:     Effort: Pulmonary effort is normal.  Abdominal:     General: There is no distension.     Palpations: Abdomen is soft.  Musculoskeletal:        General: Normal range of motion.  Right shoulder: Normal.     Left shoulder: Normal.     Cervical back: Normal range of motion and neck supple.     Right hip: Normal.     Left hip: Normal.     Right knee: Normal.     Left knee: Normal.  Lymphadenopathy:     Head:     Right side of head: No submandibular, preauricular or posterior auricular adenopathy.     Left side of head: No submandibular, preauricular or posterior auricular adenopathy.     Cervical: No cervical adenopathy.     Right cervical: No superficial or deep cervical adenopathy.    Left cervical: No superficial or deep cervical adenopathy.  Skin:    General: Skin is warm and dry.     Coloration: Skin is not pale.     Findings: No abrasion, bruising, ecchymosis, erythema, lesion or rash.     Nails: There is no clubbing.  Neurological:     General: No focal deficit present.     Mental Status: She is alert and oriented to person, place, and time.     Sensory: No sensory deficit.     Coordination: Coordination normal.     Gait: Gait normal.  Psychiatric:        Attention and Perception: Attention normal. She is attentive.        Mood and Affect: Mood is anxious.        Speech: Speech normal.         Behavior: Behavior normal. Behavior is cooperative.        Thought Content: Thought content normal.        Cognition and Memory: Cognition and memory normal.        Judgment: Judgment normal.   Knee dressed  Lab Results Lab Results  Component Value Date   WBC 16.8 (H) 02/14/2022   HGB 10.6 (L) 02/14/2022   HCT 32.1 (L) 02/14/2022   MCV 90.2 02/14/2022   PLT 471 (H) 02/14/2022    Lab Results  Component Value Date   CREATININE 0.59 02/14/2022   BUN 12 02/14/2022   NA 136 02/14/2022   K 3.6 02/14/2022   CL 107 02/14/2022   CO2 20 (L) 02/14/2022    Lab Results  Component Value Date   ALT 22 02/12/2021   AST 20 02/12/2021   ALKPHOS 120 02/12/2021   BILITOT 1.5 (H) 02/12/2021     Microbiology: Recent Results (from the past 240 hour(s))  Aerobic/Anaerobic Culture w Gram Stain (surgical/deep wound)     Status: None (Preliminary result)   Collection Time: 02/13/22  2:02 PM   Specimen: PATH Soft tissue  Result Value Ref Range Status   Specimen Description WOUND  Final   Special Requests LEFT KNEE INFECTION  Final   Gram Stain   Final    ABUNDANT WBC PRESENT, PREDOMINANTLY PMN FEW GRAM POSITIVE COCCI    Culture   Final    MODERATE STAPHYLOCOCCUS AUREUS SUSCEPTIBILITIES TO FOLLOW Performed at [facility], [address] [place], [place] [address]    Report Status PENDING  Incomplete  Aerobic/Anaerobic Culture w Gram Stain (surgical/deep wound)     Status: None (Preliminary result)   Collection Time: 02/13/22  2:03 PM   Specimen: PATH Soft tissue  Result Value Ref Range Status   Specimen Description WOUND  Final   Special Requests LEFT KNEE INFECTION  Final   Gram Stain   Final    ABUNDANT WBC PRESENT, PREDOMINANTLY PMN RARE  GRAM POSITIVE COCCI IN PAIRS Performed at [facility], [address] [place], [place] [address]    Culture MODERATE STAPHYLOCOCCUS AUREUS  Final   Report Status PENDING  Incomplete    Acey Lav, MD [facility] [phone] pager  02/14/2022, 11:47 AM

## 2022-02-14 NOTE — Progress Notes (Signed)
When put in PIV with USG, patient had a lot of pain, before advanced catheter. Patient wanted to take out catheter due to too much pain. Patient's tearing on the bed and refused to have another PIV at this time. Informed patient's RN regarding this matter. HS [employer]

## 2022-02-14 NOTE — Hospital Course (Addendum)
Kerry Young is a 56 y.o. female with a history of PTSD, anxiety, chronic pain and tibial plateau fracture s/p ORIF.  Patient presented secondary to pain at her incision site with evidence of septic arthritis secondary to MRSA infection. Patient managed with antibiotics and surgical debridement. PICC line placed for prolonged IV antibiotics.

## 2022-02-14 NOTE — Progress Notes (Signed)
     Subjective:  Patient reports pain as moderate requiring dilaudid IV for pain control. Intact sensation distally. Denies distal n/t.  Objective:   VITALS:   Vitals:   02/13/22 2015 02/14/22 0016 02/14/22 0438 02/14/22 0738  BP: 104/74 (!) 90/52 110/65 121/74  Pulse: 100 100 (!) 110 (!) 109  Resp: 18 18 16 17   Temp: 98.3 F (36.8 C)  98.4 F (36.9 C) 98.8 F (37.1 C)  TempSrc:    Oral  SpO2: 95% 95% 96% 97%  Weight:      Height:        Sensation intact distally Dorsiflexion/Plantar flexion intact Incision: dressing C/D/I Compartment soft   Lab Results  Component Value Date   WBC 16.8 (H) 02/14/2022   HGB 10.6 (L) 02/14/2022   HCT 32.1 (L) 02/14/2022   MCV 90.2 02/14/2022   PLT 471 (H) 02/14/2022   BMET    Component Value Date/Time   NA 136 02/14/2022 0130   K 3.6 02/14/2022 0130   CL 107 02/14/2022 0130   CO2 20 (L) 02/14/2022 0130   GLUCOSE 119 (H) 02/14/2022 0130   BUN 12 02/14/2022 0130   CREATININE 0.59 02/14/2022 0130   CALCIUM 9.1 02/14/2022 0130   GFRNONAA >60 02/14/2022 0130    Xray: post op xrays demonstrate left knee tibia fx hardware intact no adverse features  Assessment/Plan: 1 Day Post-Op   Principal Problem:   Septic joint of left knee joint (HCC) Active Problems:   GAD (generalized anxiety disorder)   Obesity (BMI 30-39.9)   Chronic pain disorder  Post op recs: WB: NWB  in splint Abx: broad spectrum abx pending cxs and ID recs Imaging: PACU xrays Dressing: keep intact until follow up, continue drain    Tyauna Lacaze A Tanishka Drolet 02/14/2022, 11:42 AM   02/16/2022, MD  Contact informationWeber Cooks 7am-5pm epic message Dr. :   [email or web address], or call office for patient follow up: [phone] After hours and holidays please check [email or web address] for group call information for [facility]

## 2022-02-14 NOTE — Assessment & Plan Note (Signed)
Body mass index is 39.48 kg/m.

## 2022-02-14 NOTE — Progress Notes (Signed)
  Progress Note   Patient: Kerry Young DTO:[id number] DOB: 07-04-1966 DOA: 02/12/2022     1 DOS: the patient was seen and examined on 02/14/2022       Brief hospital course: Mrs. Zumbro is a 56 y.o. F with migraines, PTSD/anxiety and recent tibial plateau fracture s/p ORIF 7/12 who unfortunately fell about a week after surgery and subsequently had increased pain and drainage from her surgical incision and so returned to the hospital.  In the ER, findings were concerning for postoperative prosthetic infection and so she was taken to the OR for washout.     Assessment and Plan: * Septic joint of left knee joint (HCC) Incision and drainage of left septic knee Irrigation and debridement of left postoperative infection Closed reduction and splinting of left knee   - NWB to LLE - PT eval - Continue IV antibiotics - Consult ID, appreciate cares - Elqiuis for DVT ppx  Chronic pain disorder - Continue home Lyrica -Hold home oxycodone for now, utilize hydromorphone  Obesity (BMI 30-39.9) BMI 33  GAD (generalized anxiety disorder) - Resume home Xanax, Seroquel          Subjective: Patient is anxious, she has some withdrawal symptoms from Xanax.  No fever, headache, confusion, respiratory distress, chest pain, dyspnea     Physical Exam: Vitals:   02/14/22 0438 02/14/22 0738 02/14/22 1443 02/14/22 1446  BP: 110/65 121/74 98/64 (!) 100/57  Pulse: (!) 110 (!) 109 95   Resp: 16 17 18    Temp: 98.4 F (36.9 C) 98.8 F (37.1 C) 98 F (36.7 C)   TempSrc:  Oral    SpO2: 96% 97% 98%   Weight:      Height:       Adult female, lying in bed, no acute distress RRR, no murmurs, no peripheral edema Respiratory rate normal, lungs clear without rales or wheezes Abdomen soft, no tenderness palpation or guarding The left leg is wrapped Attention normal, affect appropriate, judgment and insight appear normal, face symmetric, speech fluent  Data Reviewed: Basic metabolic panel  normal Hemogram shows leukocytosis, mild anemia, normal platelets Knee x-rays reviewed, hardware in place      Disposition: Status is: Inpatient         Author: , MD 02/14/2022 3:44 PM  For on call review [email or web address].

## 2022-02-14 NOTE — Assessment & Plan Note (Addendum)
Patient is on chronic opiates as an outpatient. Patient transitioned to a PCA pump this admission which was discontinued on 8/1. She was transitioned to dilaudid PO prior to transitioning to oxycodone per orthopedic surgery. Continue Lyrica on discharge.

## 2022-02-14 NOTE — Assessment & Plan Note (Addendum)
Patient managed empirically on Vancomycin and Ceftriaxone. Patient is s/p incision and drainage/ irrigation and debridement of left knee/ closed reduction and splinting. Intraoperative wound cultures significant for MRSA. ID consulted. Patient transitioned to Daptomycin with recommendations for Daptomycin IV x6 weeks. Rifampin added. End date of antibiotics of 04/01/2022. External fixation performed on 8/2. Patient declines SNF and home health PT. Patient discharged on Lovenox for DVT prophylaxis. Recommendation for outpatient orthopedic surgery and infectious disease follow-up.

## 2022-02-14 NOTE — Assessment & Plan Note (Addendum)
Continue home Xanax and Seroquel.

## 2022-02-14 NOTE — Progress Notes (Addendum)
Pharmacy Antibiotic Note  Kerry Young is a 56 y.o. female admitted on 02/12/2022 with  septic knee and hardware associated infection .  Pharmacy has been consulted for Daptomycin dosing.  Plan: Daptomycin 600 mg IV q24h (~9.5 mg/kg Adjusted body weight) Monitor renal function and CPK weekly Follow cultures and susceptibilities  Height: 5\' 2"  (157.5 cm) Weight: 83 kg (182 lb 15.7 oz) IBW/kg (Calculated) : 50.1 kg AdjBW: 63 kg  Temp (24hrs), Avg:98.2 F (36.8 C), Min:97.8 F (36.6 C), Max:98.8 F (37.1 C)  Recent Labs  Lab 02/13/22 0147 02/13/22 0523 02/14/22 0130  WBC 19.6*  --  16.8*  CREATININE 0.59  --  0.59  LATICACIDVEN 0.8 0.9  --     Estimated Creatinine Clearance: 79.4 mL/min (by C-G formula based on SCr of 0.59 mg/dL).    Allergies  Allergen Reactions   Amitriptyline Other (See Comments)    Cold sores on mouth, blister on tongue. No facial swelling    Celexa [Citalopram] Nausea And Vomiting    Took it 2 days. Had n/v and migraine   Naproxen Other (See Comments)   Citalopram Nausea And Vomiting   Naproxen Other (See Comments)    UNK Reaction   Ondansetron Hcl Other (See Comments)    Headache  Headache   Other Nausea And Vomiting    ALL ANTIBIOTICS   Zofran [Ondansetron] Other (See Comments)    Antimicrobials this admission: Vancomycin 7/28 - 7/29 Ceftriaxone 7/28 x1  Daptomycin 7/29 >>   Microbiology results: 7/28 Wound Cx x2: moderate staph aureus   Thank you for allowing pharmacy to be a part of this patient's care.  8/28 02/14/2022 12:21 PM _______________________________________________________________ I discussed / reviewed the pharmacy note by Dr. 02/16/2022 and I agree with the resident's findings and plans as documented. Kerri Perches BS, PharmD, BCPS Clinical Pharmacist 02/14/2022 1:11 PM  Contact: [phone] after 3 PM  "Be curious, not judgmental..." -[phone] _______________________________________________________________

## 2022-02-15 ENCOUNTER — Inpatient Hospital Stay: Payer: Self-pay

## 2022-02-15 DIAGNOSIS — F411 Generalized anxiety disorder: Secondary | ICD-10-CM

## 2022-02-15 DIAGNOSIS — G894 Chronic pain syndrome: Secondary | ICD-10-CM | POA: Diagnosis not present

## 2022-02-15 DIAGNOSIS — T847XXD Infection and inflammatory reaction due to other internal orthopedic prosthetic devices, implants and grafts, subsequent encounter: Secondary | ICD-10-CM | POA: Diagnosis not present

## 2022-02-15 DIAGNOSIS — M00062 Staphylococcal arthritis, left knee: Secondary | ICD-10-CM | POA: Diagnosis not present

## 2022-02-15 DIAGNOSIS — R000 Tachycardia, unspecified: Secondary | ICD-10-CM

## 2022-02-15 DIAGNOSIS — E669 Obesity, unspecified: Secondary | ICD-10-CM | POA: Diagnosis not present

## 2022-02-15 LAB — CBC
HCT: 32.4 % — ABNORMAL LOW (ref 36.0–46.0)
Hemoglobin: 10.4 g/dL — ABNORMAL LOW (ref 12.0–15.0)
MCH: 29.3 pg (ref 26.0–34.0)
MCHC: 32.1 g/dL (ref 30.0–36.0)
MCV: 91.3 fL (ref 80.0–100.0)
Platelets: 420 10*3/uL — ABNORMAL HIGH (ref 150–400)
RBC: 3.55 MIL/uL — ABNORMAL LOW (ref 3.87–5.11)
RDW: 14.8 % (ref 11.5–15.5)
WBC: 10 10*3/uL (ref 4.0–10.5)
nRBC: 0 % (ref 0.0–0.2)

## 2022-02-15 LAB — BASIC METABOLIC PANEL
Anion gap: 10 (ref 5–15)
BUN: 12 mg/dL (ref 6–20)
CO2: 19 mmol/L — ABNORMAL LOW (ref 22–32)
Calcium: 8.6 mg/dL — ABNORMAL LOW (ref 8.9–10.3)
Chloride: 108 mmol/L (ref 98–111)
Creatinine, Ser: 0.63 mg/dL (ref 0.44–1.00)
GFR, Estimated: >60 mL/min (ref >60)
Glucose, Bld: 111 mg/dL — ABNORMAL HIGH (ref 70–99)
Potassium: 3.4 mmol/L — ABNORMAL LOW (ref 3.5–5.1)
Sodium: 137 mmol/L (ref 135–145)

## 2022-02-15 LAB — HEPATITIS B SURFACE ANTIBODY, QUANTITATIVE: Hep B S AB Quant (Post): <3.1 m[IU]/mL — ABNORMAL LOW (ref >9.9)

## 2022-02-15 LAB — HCV AB W REFLEX TO QUANT PCR: HCV Ab: NONREACTIVE

## 2022-02-15 LAB — HCV INTERPRETATION

## 2022-02-15 MED ORDER — DIPHENHYDRAMINE HCL 50 MG/ML IJ SOLN
12.5000 mg | Freq: Four times a day (QID) | INTRAMUSCULAR | Status: DC | PRN
  Administered 2022-02-15: 12.5 mg via INTRAVENOUS
  Filled 2022-02-15: qty 1

## 2022-02-15 MED ORDER — ENOXAPARIN SODIUM 40 MG/0.4ML IJ SOSY
40.0000 mg | PREFILLED_SYRINGE | INTRAMUSCULAR | Status: DC
  Administered 2022-02-15 – 2022-02-23 (×9): 40 mg via SUBCUTANEOUS
  Filled 2022-02-15 (×9): qty 0.4

## 2022-02-15 MED ORDER — LACTATED RINGERS IV BOLUS
500.0000 mL | Freq: Once | INTRAVENOUS | Status: AC
  Administered 2022-02-15: 500 mL via INTRAVENOUS

## 2022-02-15 MED ORDER — CHLORHEXIDINE GLUCONATE CLOTH 2 % EX PADS
6.0000 | MEDICATED_PAD | Freq: Every day | CUTANEOUS | Status: DC
  Administered 2022-02-15 – 2022-02-23 (×7): 6 via TOPICAL

## 2022-02-15 MED ORDER — ACETAMINOPHEN 500 MG PO TABS
1000.0000 mg | ORAL_TABLET | Freq: Three times a day (TID) | ORAL | Status: DC
  Administered 2022-02-15 – 2022-02-19 (×11): 1000 mg via ORAL
  Filled 2022-02-15 (×11): qty 2

## 2022-02-15 MED ORDER — SENNOSIDES-DOCUSATE SODIUM 8.6-50 MG PO TABS
1.0000 | ORAL_TABLET | Freq: Two times a day (BID) | ORAL | Status: DC | PRN
  Administered 2022-02-16 – 2022-02-22 (×2): 1 via ORAL
  Filled 2022-02-15 (×2): qty 1

## 2022-02-15 MED ORDER — DIPHENHYDRAMINE HCL 12.5 MG/5ML PO ELIX
12.5000 mg | ORAL_SOLUTION | Freq: Four times a day (QID) | ORAL | Status: DC | PRN
  Administered 2022-02-16: 12.5 mg via ORAL
  Filled 2022-02-15 (×2): qty 10

## 2022-02-15 MED ORDER — SODIUM CHLORIDE 0.9% FLUSH
10.0000 mL | INTRAVENOUS | Status: DC | PRN
  Administered 2022-02-15 – 2022-02-23 (×4): 10 mL

## 2022-02-15 MED ORDER — SODIUM CHLORIDE 0.9% FLUSH
10.0000 mL | Freq: Two times a day (BID) | INTRAVENOUS | Status: DC
  Administered 2022-02-15 – 2022-02-23 (×16): 10 mL

## 2022-02-15 MED ORDER — HYDROMORPHONE 1 MG/ML IV SOLN
INTRAVENOUS | Status: DC
  Administered 2022-02-15: 0.9 mg via INTRAVENOUS
  Administered 2022-02-16 (×2): 3.3 mg via INTRAVENOUS
  Administered 2022-02-17: 3.6 mg via INTRAVENOUS
  Filled 2022-02-15 (×2): qty 30

## 2022-02-15 NOTE — Progress Notes (Signed)
      Diagnosis: Septic arthritis and HWare in bone infection  Culture Result: MRSA  Allergies  Allergen Reactions   Amitriptyline Other (See Comments)    Cold sores on mouth, blister on tongue. No facial swelling    Celexa [Citalopram] Nausea And Vomiting    Took it 2 days. Had n/v and migraine   Naproxen Other (See Comments)   Citalopram Nausea And Vomiting   Naproxen Other (See Comments)    UNK Reaction   Ondansetron Hcl Other (See Comments)    Headache  Headache   Other Nausea And Vomiting    ALL ANTIBIOTICS   Zofran [Ondansetron] Other (See Comments)    OPAT Orders Discharge antibiotics to be given via PICC line Discharge antibiotics: Daptomycin Aim for Vancomycin trough 15-20 or AUC 400-550 (unless otherwise indicated) Duration: 6 weeks End Date: 03/27/2022  PIC Care Per Protocol:  Home health RN for IV administration and teaching; PICC line care and labs.    Labs weekly while on IV antibiotics: __x CBC with differential _ _x_ CMP _x_ CRP _x_ ESR  _x_ CK  _x_ Please pull PIC at completion of IV antibiotics __ Please leave PIC in place until doctor has seen patient or been notified  Fax weekly labs to [phone]  Clinic Follow Up Appt:  Kerry Young has an appointment on 03/12/2022 at 1115 with Dr. Van Dam at :   [facility] which is located in the [facility] at   [address] in [place].   [address], which is located to the left of the elevators.   Phone: [phone]   Fax: [phone]   [email or web address]   She should arrive 30 minutes prior to her appointment   I understand she may have further hardware placed.   My own opinion on RIFAMPIN to help with biofilms is that while it can work in the patient that can tolerate it the VAST majority do NOT esp when they leave the hospital and are at home where they are not getting IV antiemetics, IVF, daily  labs.  I also would strongly predict that THIS patient would also not tolerate it  As far as I am concerned we can sign off on this patient as my plan would be 6 weeks of IV dapto (unless insurance forces us to use vancomycin) followed by chronic oral antibiotics--likely doxycyline  I will leave on our rounding list so OPAT can be sorted out but otherwise we will sign off.     Van Dam 02/15/2022, 6:45 PM  

## 2022-02-15 NOTE — Progress Notes (Signed)
Peripherally Inserted Central Catheter Placement  The IV Nurse has discussed with the patient and/or persons authorized to consent for the patient, the purpose of this procedure and the potential benefits and risks involved with this procedure.  The benefits include less needle sticks, lab draws from the catheter, and the patient may be discharged home with the catheter. Risks include, but not limited to, infection, bleeding, blood clot (thrombus formation), and puncture of an artery; nerve damage and irregular heartbeat and possibility to perform a PICC exchange if needed/ordered by physician.  Alternatives to this procedure were also discussed.  Bard Power PICC patient education guide, fact sheet on infection prevention and patient information card has been provided to patient /or left at bedside.   RN made aware to remove PIV. Site currently has a PCA pump connected.  PICC Placement Documentation  PICC Single Lumen 02/15/22 Left Brachial 43 cm 0 cm (Active)  Indication for Insertion or Continuance of Line Home intravenous therapies (PICC only) 02/15/22 1600  Exposed Catheter (cm) 0 cm 02/15/22 1600  Site Assessment Clean, Dry, Intact 02/15/22 1600  Line Status Flushed;Saline locked;Blood return noted 02/15/22 1600  Dressing Type Transparent;Securing device 02/15/22 1600  Dressing Status Antimicrobial disc in place 02/15/22 1600  Safety Lock Not Applicable 02/15/22 1600  Line Care Connections checked and tightened 02/15/22 1600  Line Adjustment (NICU/IV Team Only) No 02/15/22 1600  Dressing Intervention New dressing 02/15/22 1600  Dressing Change Due 02/22/22 02/15/22 1600       Vernona Rieger  Christin Mccreedy 02/15/2022, 4:54 PM

## 2022-02-15 NOTE — Progress Notes (Signed)
Subjective: Multiple complaints with regards to care for night nurses laughing and her her not receiving opiates left knee pain still being severe   Antibiotics:  Anti-infectives (From admission, onward)    Start     Dose/Rate Route Frequency Ordered Stop   02/14/22 1715  DAPTOmycin (CUBICIN) 600 mg in sodium chloride 0.9 % IVPB        600 mg 124 mL/hr over 30 Minutes Intravenous Daily 02/14/22 1618     02/14/22 1330  DAPTOmycin (CUBICIN) 600 mg in sodium chloride 0.9 % IVPB  Status:  Discontinued        600 mg 124 mL/hr over 30 Minutes Intravenous Daily 02/14/22 1236 02/14/22 1618   02/14/22 0400  vancomycin (VANCOREADY) IVPB 1250 mg/250 mL  Status:  Discontinued        1,250 mg 166.7 mL/hr over 90 Minutes Intravenous Every 24 hours 02/13/22 0347 02/14/22 1150   02/13/22 1715  cefTRIAXone (ROCEPHIN) 2 g in sodium chloride 0.9 % 100 mL IVPB  Status:  Discontinued        2 g 200 mL/hr over 30 Minutes Intravenous Every 24 hours 02/13/22 1627 02/14/22 1150   02/13/22 1416  tobramycin (NEBCIN) powder  Status:  Discontinued          As needed 02/13/22 1416 02/13/22 1454   02/13/22 1414  vancomycin (VANCOCIN) powder  Status:  Discontinued          As needed 02/13/22 1414 02/13/22 1454   02/13/22 0345  vancomycin (VANCOREADY) IVPB 1500 mg/300 mL        1,500 mg 150 mL/hr over 120 Minutes Intravenous  Once 02/13/22 0342 02/13/22 0636       Medications: Scheduled Meds:  acetaminophen  1,000 mg Oral Q8H   enoxaparin (LOVENOX) injection  40 mg Subcutaneous Q24H   ondansetron (ZOFRAN) IV  4 mg Intravenous Once   pregabalin  300 mg Oral BID   QUEtiapine Fumarate  150 mg Oral QHS   Continuous Infusions:  DAPTOmycin (CUBICIN) 600 mg in sodium chloride 0.9 % IVPB 600 mg (02/14/22 1726)   PRN Meds:.acetaminophen **OR** acetaminophen, ALPRAZolam, HYDROmorphone (DILAUDID) injection, naLOXone (NARCAN)  injection    Objective: Weight change:   Intake/Output Summary (Last 24  hours) at 02/15/2022 1030 Last data filed at 02/15/2022 0600 Gross per 24 hour  Intake 62 ml  Output 1145 ml  Net -1083 ml   Blood pressure (!) 125/91, pulse (!) 103, temperature 99.4 F (37.4 C), temperature source Oral, resp. rate 16, height 5\' 2"  (1.575 m), weight 83 kg, SpO2 96 %. Temp:  [98 F (36.7 C)-99.4 F (37.4 C)] 99.4 F (37.4 C) (07/30 0728) Pulse Rate:  [95-110] 103 (07/30 0728) Resp:  [16-18] 16 (07/30 0420) BP: (98-125)/(57-91) 125/91 (07/30 0728) SpO2:  [96 %-98 %] 96 % (07/30 0728)  Physical Exam: Physical Exam Constitutional:      General: She is not in acute distress.    Appearance: She is well-developed. She is not diaphoretic.  HENT:     Head: Normocephalic and atraumatic.     Right Ear: External ear normal.     Left Ear: External ear normal.     Mouth/Throat:     Pharynx: No oropharyngeal exudate.  Eyes:     General: No scleral icterus.    Conjunctiva/sclera: Conjunctivae normal.     Pupils: Pupils are equal, round, and reactive to light.  Cardiovascular:     Rate and Rhythm: Normal rate and regular  rhythm.     Heart sounds: Normal heart sounds. No murmur heard.    No friction rub. No gallop.  Pulmonary:     Effort: Pulmonary effort is normal. No respiratory distress.     Breath sounds: Normal breath sounds. No wheezing or rales.  Abdominal:     General: Bowel sounds are normal. There is no distension.     Palpations: Abdomen is soft.     Tenderness: There is no abdominal tenderness. There is no rebound.  Musculoskeletal:        General: No tenderness.  Lymphadenopathy:     Cervical: No cervical adenopathy.  Skin:    General: Skin is warm and dry.     Coloration: Skin is not pale.     Findings: No erythema or rash.  Neurological:     General: No focal deficit present.     Mental Status: She is alert and oriented to person, place, and time.     Motor: No abnormal muscle tone.     Coordination: Coordination normal.  Psychiatric:        Mood  and Affect: Mood normal.        Behavior: Behavior normal.        Thought Content: Thought content normal.        Judgment: Judgment normal.     Knee wrapped  CBC:    BMET Recent Labs    02/14/22 0130 02/15/22 0207  NA 136 137  K 3.6 3.4*  CL 107 108  CO2 20* 19*  GLUCOSE 119* 111*  BUN 12 12  CREATININE 0.59 0.63  CALCIUM 9.1 8.6*     Liver Panel  No results for input(s): "PROT", "ALBUMIN", "AST", "ALT", "ALKPHOS", "BILITOT", "BILIDIR", "IBILI" in the last 72 hours.     Sedimentation Rate Recent Labs    02/14/22 1457  ESRSEDRATE 62*   C-Reactive Protein Recent Labs    02/13/22 0523 02/14/22 1457  CRP 24.6* 23.2*    Micro Results: Recent Results (from the past 720 hour(s))  Aerobic/Anaerobic Culture w Gram Stain (surgical/deep wound)     Status: None (Preliminary result)   Collection Time: 02/13/22  2:02 PM   Specimen: PATH Soft tissue  Result Value Ref Range Status   Specimen Description WOUND  Final   Special Requests LEFT KNEE INFECTION  Final   Gram Stain   Final    ABUNDANT WBC PRESENT, PREDOMINANTLY PMN FEW GRAM POSITIVE COCCI    Culture   Final    MODERATE STAPHYLOCOCCUS AUREUS SUSCEPTIBILITIES TO FOLLOW Performed at [facility], [address] [place], [place] [address]    Report Status PENDING  Incomplete  Aerobic/Anaerobic Culture w Gram Stain (surgical/deep wound)     Status: None (Preliminary result)   Collection Time: 02/13/22  2:03 PM   Specimen: PATH Soft tissue  Result Value Ref Range Status   Specimen Description WOUND  Final   Special Requests LEFT KNEE INFECTION  Final   Gram Stain   Final    ABUNDANT WBC PRESENT, PREDOMINANTLY PMN RARE GRAM POSITIVE COCCI IN PAIRS Performed at [facility], [address] [place], [place] [address]    Culture MODERATE STAPHYLOCOCCUS AUREUS  Final   Report Status PENDING  Incomplete    Studies/Results: [place] EKG SITE RITE  Result Date: 02/15/2022 If Site Rite image not  attached, placement could not be confirmed due to current cardiac rhythm.  DG Knee Left Port  Result Date: 02/13/2022 CLINICAL DATA:  Status  post left knee irrigation and debridement. EXAM: PORTABLE LEFT KNEE - 1-2 VIEW COMPARISON:  Left knee radiographs 02/13/2022 at 12:57 a.m. FINDINGS: Redemonstration of proximal lateral tibial plate and screw fixation. Mild-to-moderate displacement of the components of a markedly comminuted proximal left tibial fractures are again seen. There is no overlying splint material. There is a new anterior proximal tibial postsurgical soft tissue drain. IMPRESSION: Interval irrigation and debridement of comminuted proximal tibial fracture status post ORIF. New proximal anterior tibial soft tissue drain. Electronically Signed   By: Neita Garnet M.D.   On: 02/13/2022 15:48   DG Knee 1-2 Views Left  Result Date: 02/13/2022 CLINICAL DATA:  Irrigation and debridement of left lower extremity. Intraoperative fluoroscopy. EXAM: LEFT KNEE - 1-2 VIEW COMPARISON:  Left knee radiographs 02/13/2022 FINDINGS: Images were performed intraoperatively without the presence of a radiologist. Redemonstration of proximal lateral tibial plate and screw fixation of mildly to moderately displaced comminuted fractures. Total fluoroscopy images: 4 Total fluoroscopy time: 11 seconds Total dose: Radiation Exposure Index (as provided by the fluoroscopic device): 0.85 mGy air Kerma Please see intraoperative findings for further detail. IMPRESSION: Intraoperative fluoroscopy for left proximal tibial irrigation and debridement. Electronically Signed   By: Neita Garnet M.D.   On: 02/13/2022 14:59   DG C-Arm 1-60 Min-No Report  Result Date: 02/13/2022 Fluoroscopy was utilized by the requesting physician.  No radiographic interpretation.      Assessment/Plan:  INTERVAL HISTORY: Alters have come back positive for Staphylococcus aureus sensitivities pending   Principal Problem:   Septic joint of left  knee joint (HCC) Active Problems:   GAD (generalized anxiety disorder)   Obesity (BMI 30-39.9)   Chronic pain disorder   Sinus tachycardia    CHONDRA BOYDE is a 56 y.o. 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  Staphylococcus aureus has been isolate  #1 septic knee and hardware associated infection with Staphylococcus aureus.  I have placed order for PICC line.  She is going to need 6 weeks of parenteral therapy and if hardware is indeed involved still she will need oral therapy afterwards.  We need clarity from orthopedics if they think that some of the remaining hardware could potentially still be seeded with Staphylococcus aureus.  If there is any uncertainty I would err on the side of presuming hardware to be infected.  KINYA MEINE has an appointment on 03/12/2022 at 1115 with Dr. Daiva Eves at :   [facility] which is located in the [facility] at   [address] [place] in [place].   [address], which is located to the left of the elevators.   Phone: [phone]   Fax: [phone]   [email or web address]   She should arrive 30 minutes prior to her appointment.  If her Hathcox aureus sensitivities are back today I will put in final OPA T orders.  If not the team rounding tomorrow will make that final decision.    I spent 36 minutes with the patient including than 50% of the time in face to face counseling of the patient the nature of Staphylococcus aureus infections involving joint deep infection hardware/infection  along with review of medical records in preparation for the visit and during the visit and in coordination  of her care.      LOS: 2 days   Acey Lav 02/15/2022, 10:30 AM

## 2022-02-15 NOTE — Evaluation (Signed)
Physical Therapy Evaluation Patient Details Name: Kerry Young MRN: [id number] DOB: 1966/03/03 Today's Date: 02/15/2022  History of Present Illness  Pt is a 56 yr old old female who presented after fall from [facility] causing "oozing" from surgical site. Pt s/p 7/12 ORIF of L bicondylar plateau fx and repair of L lateral meniscus tear. PMH: UTI, anxiety, PONV, sciatica, PTSD, ORIF L ulnar fx, R IM nail of tibial, ORIF of R humerus  Clinical Impression  PTA pt was limited to WC mobility since LLE ORIF on 7/12 and requiring assistance from her mother for ADLs/IADLs. Pt refusing mobility this session due to pain and fatigue. Unable to achieve long sitting using bed rails to don gown due to pain and unable to perform active motion in L ankle and knee. Pt agreeable to attempt further mobility with PT tomorrow. PT will continue to follow to address deficits in ROM, strength, and functional mobility. Pt refusing SNF at this time so PT recommending home health upon discharge.      Recommendations for follow up therapy are one component of a multi-disciplinary discharge planning process, led by the attending physician.  Recommendations may be updated based on patient status, additional functional criteria and insurance authorization.  Follow Up Recommendations Home health PT (Pt refusing SNF)      Assistance Recommended at Discharge Frequent or constant Supervision/Assistance  Patient can return home with the following  A lot of help with walking and/or transfers;A lot of help with bathing/dressing/bathroom;Assistance with cooking/housework;Assist for transportation;Help with stairs or ramp for entrance    Equipment Recommendations None recommended by PT  Recommendations for Other Services  OT consult    Functional Status Assessment Patient has had a recent decline in their functional status and demonstrates the ability to make significant improvements in function in a reasonable and predictable amount of  time.     Precautions / Restrictions Precautions Precautions: Fall Restrictions Weight Bearing Restrictions: Yes LLE Weight Bearing: Non weight bearing      Mobility  Bed Mobility               General bed mobility comments: unable to fully achieve long sitting due to increase in pain    Transfers                        Ambulation/Gait                  Stairs            Wheelchair Mobility    Modified Rankin (Stroke Patients Only)       Balance                                             Pertinent Vitals/Pain Pain Assessment Pain Assessment: Faces Faces Pain Scale: Hurts whole lot Pain Location: LLE Pain Descriptors / Indicators: Grimacing, Moaning Pain Intervention(s): Limited activity within patient's tolerance, Monitored during session    Home Living Family/patient expects to be discharged to:: Private residence Living Arrangements: Parent Available Help at Discharge: Family;Available 24 hours/day Type of Home: House Home Access: Stairs to enter Entrance Stairs-Rails: None Entrance Stairs-Number of Steps: 2   Home Layout: One level Home Equipment: Agricultural consultant (2 wheels);Rollator (4 wheels);Wheelchair - manual Additional Comments: Mother has been providing care since last DC    Prior Function Prior Level of  Function : Needs assist             Mobility Comments: has been using WC since surgery, bed bound since oozing began       Hand Dominance        Extremity/Trunk Assessment   Upper Extremity Assessment Upper Extremity Assessment: Overall WFL for tasks assessed    Lower Extremity Assessment Lower Extremity Assessment: LLE deficits/detail LLE Deficits / Details: able to wiggle toes but unable to perform any ankle or knee ROM without yelling in pain    Cervical / Trunk Assessment Cervical / Trunk Assessment: Normal  Communication   Communication: No difficulties  Cognition  Arousal/Alertness: Awake/alert Behavior During Therapy: Anxious Overall Cognitive Status: Within Functional Limits for tasks assessed                                 General Comments: anxious about infection and        General Comments      Exercises     Assessment/Plan    PT Assessment Patient needs continued PT services  PT Problem List Decreased range of motion;Decreased strength;Decreased activity tolerance;Decreased balance;Decreased mobility;Pain       PT Treatment Interventions DME instruction;Gait training;Stair training;Functional mobility training;Therapeutic activities;Therapeutic exercise;Patient/family education;Wheelchair mobility training    PT Goals (Current goals can be found in the Care Plan section)  Acute Rehab PT Goals Patient Stated Goal: Go home PT Goal Formulation: With patient Time For Goal Achievement: 03/01/22 Potential to Achieve Goals: Fair Additional Goals Additional Goal #1: Pt will propel WC 64ft with supervision    Frequency Min 3X/week     Co-evaluation               AM-PAC PT "6 Clicks" Mobility  Outcome Measure Help needed turning from your back to your side while in a flat bed without using bedrails?: A Little Help needed moving from lying on your back to sitting on the side of a flat bed without using bedrails?: A Lot Help needed moving to and from a bed to a chair (including a wheelchair)?: Total Help needed standing up from a chair using your arms (e.g., wheelchair or bedside chair)?: Total Help needed to walk in hospital room?: Total Help needed climbing 3-5 steps with a railing? : Total 6 Click Score: 9    End of Session   Activity Tolerance: Patient limited by pain Patient left: in bed;with call bell/phone within reach;with bed alarm set Nurse Communication: Mobility status PT Visit Diagnosis: Repeated falls (R29.6);Muscle weakness (generalized) (M62.81);Difficulty in walking, not elsewhere classified  (R26.2);Pain Pain - Right/Left: Left Pain - part of body: Leg    Time: 4132-4401 PT Time Calculation (min) (ACUTE ONLY): 14 min   Charges:   PT Evaluation $PT Eval Low Complexity: 1 Low        Davina Poke, SPT Acute Rehabilitation Services  Office: [phone]   Davina Poke 02/15/2022, 12:34 PM

## 2022-02-15 NOTE — Assessment & Plan Note (Addendum)
Resolved with fluids, PCA

## 2022-02-15 NOTE — Progress Notes (Signed)
PHARMACY CONSULT NOTE FOR:  OUTPATIENT  PARENTERAL ANTIBIOTIC THERAPY (OPAT)  Indication: septic knee and hardware complicating bone infection Regimen: Daptomycin 600 mg IV q24h End date: 03/27/22  IV antibiotic discharge orders are pended. To discharging provider:  please sign these orders via discharge navigator,  Select New Orders & click on the button choice - Manage This Unsigned Work.     Thank you for allowing pharmacy to be a part of this patient's care.  Dennie Fetters, RPh 02/15/2022, 7:41 PM

## 2022-02-15 NOTE — Plan of Care (Signed)
  Problem: Clinical Measurements: Goal: Respiratory complications will improve Outcome: Progressing Goal: Cardiovascular complication will be avoided Outcome: Progressing   Problem: Nutrition: Goal: Adequate nutrition will be maintained Outcome: Progressing   

## 2022-02-15 NOTE — Progress Notes (Addendum)
Subjective: Patient reports pain as severe. Won't let me touch her. Seemed calm and quiet until I entered the room and she immediately started crying and getting upset. Said the nurses are laughing at her and making fun of her, no one is bringing her medicines, someone else must be taking her medicines, wants a new RN, and that she wants moved to a different floor. Says ACE wrap and splint feel too tight yet won't let me touch her to loosen it without getting medicine first. Very dramatic. Tolerating diet. Urinating.  No CP, SOB. Has not mobilized OOB yet.   Objective:   VITALS:   Vitals:   02/14/22 1446 02/14/22 1958 02/15/22 0420 02/15/22 0728  BP: (!) 100/57 113/74 113/68 (!) 125/91  Pulse:  (!) 101 (!) 110 (!) 103  Resp:  16 16   Temp:  98.4 F (36.9 C) 99.3 F (37.4 C) 99.4 F (37.4 C)  TempSrc:    Oral  SpO2:  97% 97% 96%  Weight:      Height:          Latest Ref Rng & Units 02/15/2022    2:07 AM 02/14/2022    1:30 AM 02/13/2022    1:47 AM  CBC  WBC 4.0 - 10.5 K/uL 10.0  16.8  19.6   Hemoglobin 12.0 - 15.0 g/dL 36.1  44.3  15.4   Hematocrit 36.0 - 46.0 % 32.4  32.1  37.4   Platelets 150 - 400 K/uL 420  471  538       Latest Ref Rng & Units 02/15/2022    2:07 AM 02/14/2022    1:30 AM 02/13/2022    1:47 AM  BMP  Glucose 70 - 99 mg/dL 008  676  195   BUN 6 - 20 mg/dL 12  12  13    Creatinine 0.44 - 1.00 mg/dL  0.93  2.67   Sodium 135 - 145 mmol/L 137  136  140   Potassium 3.5 - 5.1 mmol/L 3.4  3.6  3.5   Chloride 98 - 111 mmol/L 108  107  114   CO2 22 - 32 mmol/L 19  20  17    Calcium 8.9 - 10.3 mg/dL 8.6  9.1  9.0    Intake/Output      07/29 0701 07/30 0700 07/30 0701 07/31 0700   P.O. 120    I.V. (mL/kg)     IV Piggyback 62    Total Intake(mL/kg) 182 (2.2)    Urine (mL/kg/hr) 1100 (0.6)    Drains 45    Blood     Total Output 1145    Net -963         Urine Occurrence 1 x       Physical Exam: General: NAD.  Sitting up in bed, calm until I entered  the room and then crying and upset Resp: No increased wob Cardio: regular rate and rhythm ABD soft Neurologically intact MSK Neurovascularly intact Sensation intact distally Intact pulses distally Can move toes Incision: dressing C/D/I Splint in place Drain in place with blood in the tubing and canister  Assessment: 2 Days Post-Op  S/P Procedure(s) (LRB): IRRIGATION AND DEBRIDEMENT EXTREMITY (Left) by Dr. 8/30 on 7/28  Principal Problem:   Septic joint of left knee joint (HCC) Active Problems:   GAD (generalized anxiety disorder)   Obesity (BMI 30-39.9)   Chronic pain disorder   Plan: Ok to loosen and rewrap ACE once pain better controlled Control pain and  anxiety  Advance diet Up with therapy Incentive Spirometry Elevate and Apply ice  Weightbearing: NWB LLE Insicional and dressing care: Dressings left intact until follow-up and Reinforce dressings as needed Orthopedic device(s): Splint Showering: Keep dressing dry VTE prophylaxis: Lovenox 40mg  qd  while inpatient, can switch to Eliquis upon d/c , SCDs, ambulation Pain control: Tylenol, Dilaudid, Lyrica Follow - up plan:  with Dr. information:  Samson Frederic MD, Ramond Marrow PA-C  Dispo:  TBD     Levester Fresh, PA-C Office [phone] 02/15/2022, 7:32 AM

## 2022-02-15 NOTE — Progress Notes (Signed)
  Progress Note   Patient: GUDRUN AXE XIP:[id number] DOB: 03/05/1966 DOA: 02/12/2022     2 DOS: the patient was seen and examined on 02/15/2022       Brief hospital course: Mrs. Frese is a 56 y.o. F with migraines, PTSD/anxiety and recent tibial plateau fracture s/p ORIF 7/12 who unfortunately fell about a week after surgery and subsequently had increased pain and drainage from her surgical incision and so returned to the hospital.  In the ER, findings were concerning for postoperative prosthetic infection and so she was taken to the OR for washout.     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 [name] on 7/29  Hardware in place, presumably still seeded.  At present joint is unstable and will require further fixation, TBD tomorrow by Dr. [name] and patient.  Today, patient's pain is poorly ocntrolled, severe and escalating.  Intraop cultuers with staph. - NWB to LLE - PT eval - Continue daptomycin  - Stop PRN hydromorphone and change to PCA with plans for 24 hours PCA and then transition to oral Dilaudid - At home is on Norco 20 mg daily and Xanax 3 mg daily - Consult ID, appreciate cares - Elqiuis for DVT ppx  Sinus tachycardia ECG personally reviewed, shows sinus tach, no ST changes.  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  No confusion, fever.  No respiratory symptoms     Physical Exam: Vitals:   02/15/22 0420 02/15/22 0728 02/15/22 1328 02/15/22 1440  BP: 113/68 (!) 125/91  95/71  Pulse: (!) 110 (!) 103  99  Resp: 16  16 18   Temp: 99.3 F (37.4 C) 99.4 F (37.4 C)  98.4 F (36.9 C)  TempSrc:  Oral  Oral  SpO2: 97% 96%  97%  Weight:      Height:       Obese adult female, lying in bed, appears uncomfortable Tachycardic, regular, no murmurs, no peripheral edema Respiratory rate normal, lungs clear without rales or wheezes Abdomen soft no tenderness palpation or guarding The left leg is wrapped in Ace wrap and is exquisitely tenderness to light touch, the right leg is normal Attention normal, affect distorted by pain, face symmetric, speech fluent, moves upper extremities with normal strength and coordination    Data Reviewed: Basic metabolic panel unremarkable Hemogram shows mild anemia, weight loss and insight normal Knee cultures growing Staphylococcus aureus      Disposition: Status is: Inpatient         Author: , MD 02/15/2022 3:22 PM  For on call review [email or web address].

## 2022-02-16 DIAGNOSIS — D72829 Elevated white blood cell count, unspecified: Secondary | ICD-10-CM

## 2022-02-16 DIAGNOSIS — B9562 Methicillin resistant Staphylococcus aureus infection as the cause of diseases classified elsewhere: Secondary | ICD-10-CM | POA: Diagnosis not present

## 2022-02-16 DIAGNOSIS — M00862 Arthritis due to other bacteria, left knee: Secondary | ICD-10-CM

## 2022-02-16 DIAGNOSIS — A4902 Methicillin resistant Staphylococcus aureus infection, unspecified site: Secondary | ICD-10-CM

## 2022-02-16 DIAGNOSIS — G894 Chronic pain syndrome: Secondary | ICD-10-CM | POA: Diagnosis not present

## 2022-02-16 DIAGNOSIS — E669 Obesity, unspecified: Secondary | ICD-10-CM | POA: Diagnosis not present

## 2022-02-16 DIAGNOSIS — M00062 Staphylococcal arthritis, left knee: Secondary | ICD-10-CM | POA: Diagnosis not present

## 2022-02-16 DIAGNOSIS — D649 Anemia, unspecified: Secondary | ICD-10-CM

## 2022-02-16 DIAGNOSIS — F411 Generalized anxiety disorder: Secondary | ICD-10-CM | POA: Diagnosis not present

## 2022-02-16 LAB — BASIC METABOLIC PANEL
Anion gap: 11 (ref 5–15)
BUN: 13 mg/dL (ref 6–20)
CO2: 24 mmol/L (ref 22–32)
Calcium: 9 mg/dL (ref 8.9–10.3)
Chloride: 101 mmol/L (ref 98–111)
Creatinine, Ser: 0.72 mg/dL (ref 0.44–1.00)
GFR, Estimated: >60 mL/min (ref >60)
Glucose, Bld: 107 mg/dL — ABNORMAL HIGH (ref 70–99)
Potassium: 3.7 mmol/L (ref 3.5–5.1)
Sodium: 136 mmol/L (ref 135–145)

## 2022-02-16 LAB — CBC
HCT: 31.1 % — ABNORMAL LOW (ref 36.0–46.0)
Hemoglobin: 9.7 g/dL — ABNORMAL LOW (ref 12.0–15.0)
MCH: 29.1 pg (ref 26.0–34.0)
MCHC: 31.2 g/dL (ref 30.0–36.0)
MCV: 93.4 fL (ref 80.0–100.0)
Platelets: 456 10*3/uL — ABNORMAL HIGH (ref 150–400)
RBC: 3.33 MIL/uL — ABNORMAL LOW (ref 3.87–5.11)
RDW: 14.7 % (ref 11.5–15.5)
WBC: 10 10*3/uL (ref 4.0–10.5)
nRBC: 0 % (ref 0.0–0.2)

## 2022-02-16 MED ORDER — METHOCARBAMOL 500 MG PO TABS
1000.0000 mg | ORAL_TABLET | Freq: Three times a day (TID) | ORAL | Status: DC
  Administered 2022-02-16 – 2022-02-18 (×9): 1000 mg via ORAL
  Filled 2022-02-16 (×10): qty 2

## 2022-02-16 NOTE — Assessment & Plan Note (Addendum)
Hemoglobin of 12 on admission with acute drop likely secondary to perioperative blood loss with resultant acute blood loss anemia. Hemoglobin stable.

## 2022-02-16 NOTE — Progress Notes (Signed)
Orthopedic Tech Progress Note Patient Details:  Kerry Young [birth year] [id number]  Trimmed SPLINT per PA, and added 2 ABD PADS to ankle, then added ace wrap  Patient ID: Kerry Young, female   DOB: June 16, 1966, 56 y.o.   MRN: [id number]  Kerry Young 02/16/2022, 12:07 PM

## 2022-02-16 NOTE — H&P (View-Only) (Signed)
Orthopaedic Trauma Service Progress Note  Patient ID: Kerry Young MRN: [id number] DOB/AGE: 1966/03/28 56 y.o.  Subjective:  No acute issues Relatively calm this morning  Currently using a PCA  Discussed with patient that putting an external fixator on would be better than maintaining her in a long-leg splint.  Think she is at high risk for complications with a long-leg splint.  I do think the fixator would help with pain control given that it is likely going to be a while before any additional surgery could be considered in the setting of her septic joint.  For now she is agreeable  She continues to complain that the splint is very tight.  However splint is quite loose I can get my whole hand underneath the Ace wrap and fitted between the plaster struts  Intraoperative cultures: MRSA   ROS As above Objective:   VITALS:   Vitals:   02/16/22 0415 02/16/22 0447 02/16/22 0754 02/16/22 0859  BP:  102/82 (!) 133/119   Pulse:  (!) 103 97   Resp:  18 18 15   Temp:  98.7 F (37.1 C) 98.6 F (37 C)   TempSrc:  Oral    SpO2: 98% 99% 96% 97%  Weight:      Height:        Estimated body mass index is 33.47 kg/m as calculated from the following:   Height as of this encounter: 5\' 2"  (1.575 m).   Weight as of this encounter: 83 kg.   Intake/Output      07/30 0701 07/31 0700 07/31 0701 08/01 0700   P.O.  300   I.V. (mL/kg) 0.9 (0)    IV Piggyback 62    Total Intake(mL/kg) 62.9 (0.8) 300 (3.6)   Urine (mL/kg/hr)     Drains 80    Total Output 80    Net -17.1 +300          LABS  Results for orders placed or performed during the hospital encounter of 02/12/22 (from the past 24 hour(s))  CBC     Status: Abnormal   Collection Time: 02/16/22  3:18 AM  Result Value Ref Range   WBC 10.0 4.0 - 10.5 K/uL   RBC 3.33 (L) 3.87 - 5.11 MIL/uL   Hemoglobin 9.7 (L) 12.0 - 15.0 g/dL   HCT 02/14/22 (L) 02/18/22 - 61.4 %    MCV 93.4 80.0 - 100.0 fL   MCH 29.1 26.0 - 34.0 pg   MCHC 31.2 30.0 - 36.0 g/dL   RDW 43.1 54.0 - 08.6 %   Platelets 456 (H) 150 - 400 K/uL   nRBC 0.0 0.0 - 0.2 %  Basic metabolic panel     Status: Abnormal   Collection Time: 02/16/22  3:18 AM  Result Value Ref Range   Sodium 136 135 - 145 mmol/L   Potassium 3.7 3.5 - 5.1 mmol/L   Chloride 101 98 - 111 mmol/L   CO2 24 22 - 32 mmol/L   Glucose, Bld 107 (H) 70 - 99 mg/dL   BUN 13 6 - 20 mg/dL   Creatinine, Ser 95.0 0.44 - 1.00 mg/dL   Calcium 9.0 8.9 - 02/18/22 mg/dL   GFR, Estimated 9.32 67.1 mL/min   Anion gap 11 5 - 15     PHYSICAL EXAM:   Gen: No  acute distress, sitting up in bed Lungs: Unlabored Ext:       Left lower extremity  Splint is clean, dry and intact  Ace wraps or not tight at all  Foot with appropriate color  + DP pulse  Drain is patent   I removed the drain without difficulty today.  Patient tolerated quite well  The distal aspect of the long-leg splint does appear to be rubbing against her ankle.  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 Active Problems:   GAD (generalized anxiety disorder)   Obesity (BMI 30-39.9)   Chronic pain disorder   Sinus tachycardia   Normocytic anemia   Anti-infectives (From admission, onward)    Start     Dose/Rate Route Frequency Ordered Stop   02/14/22 1715  DAPTOmycin (CUBICIN) 600 mg in sodium chloride 0.9 % IVPB        600 mg 124 mL/hr over 30 Minutes Intravenous Daily 02/14/22 1618     02/14/22 1330  DAPTOmycin (CUBICIN) 600 mg in sodium chloride 0.9 % IVPB  Status:  Discontinued        600 mg 124 mL/hr over 30 Minutes Intravenous Daily 02/14/22 1236 02/14/22 1618   02/14/22 0400  vancomycin (VANCOREADY) IVPB 1250 mg/250 mL  Status:  Discontinued        1,250 mg 166.7 mL/hr  over 90 Minutes Intravenous Every 24 hours 02/13/22 0347 02/14/22 1150   02/13/22 1715  cefTRIAXone (ROCEPHIN) 2 g in sodium chloride 0.9 % 100 mL IVPB  Status:  Discontinued        2 g 200 mL/hr over 30 Minutes Intravenous Every 24 hours 02/13/22 1627 02/14/22 1150   02/13/22 1416  tobramycin (NEBCIN) powder  Status:  Discontinued          As needed 02/13/22 1416 02/13/22 1454   02/13/22 1414  vancomycin (VANCOCIN) powder  Status:  Discontinued          As needed 02/13/22 1414 02/13/22 1454   02/13/22 0345  vancomycin (VANCOREADY) IVPB 1500 mg/300 mL        1,500 mg 150 mL/hr over 120 Minutes Intravenous  Once 02/13/22 0342 02/13/22 0636     .  POD/HD#: 72  56 year old female septic left knee s/p ORIF bicondylar left tibial plateau fracture  -septic left knee, approximately 2 weeks status post ORIF bicondylar tibial plateau fracture  Nonweightbearing left leg  Patient currently agreeable to external fixator which I do feel would be much better for overall patient care from multiple aspects including pain control and mobility  Plan to return OR on Wednesday  [facility] to trim current long-leg splint  Ice and elevate   - Pain management:  Multimodal  Continue per primary  - DVT/PE prophylaxis:  Lovenox for now  Transition to Eliquis after final surgery during this hospitalization - ID:   Daptomycin per ID service  - Impediments to fracture healing:  Infection  Poor compliance with weightbearing restrictions  - Dispo:  OR Wednesday for external fixator     Mearl Latin, PA-C [phone] (C) 02/16/2022, 10:53 AM  [facility] [address] [place] [address] [phone] [phone] (F)    After 5pm and on the weekends please log on to Amion, go to orthopaedics and the look under the Sports Medicine  Group Call for the provider(s) on call. You can also call our office at [phone] and then follow the prompts to be connected to the  call team.   Patient ID: Kerry Young, female   DOB: 08/28/1965, 55 y.o.   MRN: [id number]  

## 2022-02-16 NOTE — Plan of Care (Signed)

## 2022-02-16 NOTE — Evaluation (Signed)
Occupational Therapy Evaluation Patient Details Name: Kerry Young MRN: [id number] DOB: 07/29/1965 Today's Date: 02/16/2022   History of Present Illness Pt is a 56 yr old old female who presented after fall from [facility] causing "oozing" from surgical site. Admitted for likely septic L knee joint/hardware. Orthopedics recommended ex fix though pt declined. Underwent I&D of L knee on 7/28. Pt previously  s/p 7/12 ORIF of L bicondylar plateau fx and repair of L lateral meniscus tear. PMH: UTI, anxiety, PONV, sciatica, PTSD, ORIF L ulnar fx, R IM nail of tibial, ORIF of R humerus   Clinical Impression   PTA, pt has been staying with her mother since previous surgery. Pt reports being mostly bedbound recently d/t pain, assist with ADLs bed level (using disposable pads for toileting). Pt presents now with continued pain and anxiety hindering progress OOB. However, pt able to demo bed mobility with Min A to sit EOB approx 5 min before requesting to lay back down. Pt requires Min A for UB ADL and Total A for LB ADLs. Would recommend SNF at DC. However, d/t pt reported previous bad SNF experience, pt adamantly declines and plans to DC home. Recommend max HH services at DC and consideration of hospital bed for optimal bed level ADL assist at DC.     Recommendations for follow up therapy are one component of a multi-disciplinary discharge planning process, led by the attending physician.  Recommendations may be updated based on patient status, additional functional criteria and insurance authorization.   Follow Up Recommendations  Skilled nursing-short term rehab (<3 hours/day) (however, pt refusing postacute rehab - recommend max HH services at DC)    Assistance Recommended at Discharge Frequent or constant Supervision/Assistance  Patient can return home with the following A lot of help with walking and/or transfers;A lot of help with bathing/dressing/bathroom;Assist for transportation;Direct supervision/assist  for financial management;Direct supervision/assist for medications management;Assistance with cooking/housework    Functional Status Assessment  Patient has had a recent decline in their functional status and demonstrates the ability to make significant improvements in function in a reasonable and predictable amount of time.  Equipment Recommendations  Hospital bed    Recommendations for Other Services       Precautions / Restrictions Precautions Precautions: Fall Precaution Comments: PCA pump Restrictions Weight Bearing Restrictions: Yes LLE Weight Bearing: Non weight bearing      Mobility Bed Mobility         Supine to sit: HOB elevated, Min assist    Assist for LE mgmt      Transfers                   General transfer comment: pt declined      Balance Overall balance assessment: Needs assistance                       ADL either performed or assessed with clinical judgement   ADL Overall ADL's : Needs assistance/impaired Eating/Feeding: Set up;Sitting   Grooming: Set up;Sitting   Upper Body Bathing: Minimal assistance;Sitting   Lower Body Bathing: Total assistance;Bed level   Upper Body Dressing : Minimal assistance;Sitting   Lower Body Dressing: Total assistance;Bed level       Toileting- Clothing Manipulation and Hygiene: Total assistance;Bed level         General ADL Comments: Limited by L LE pain though has been bedbound recently with poor mgmt of ADLs     Vision Baseline Vision/History: 1 Wears glasses Ability  to See in Adequate Light: 0 Adequate Patient Visual Report: No change from baseline Vision Assessment?: No apparent visual deficits     Perception     Praxis      Pertinent Vitals/Pain Pain Assessment Pain Assessment: Faces Faces Pain Scale: Hurts even more Pain Location: LLE Pain Descriptors / Indicators: Grimacing, Moaning Pain Intervention(s): Monitored during session, PCA encouraged     Hand  Dominance     Extremity/Trunk Assessment Upper Extremity Assessment Upper Extremity Assessment: Overall WFL for tasks assessed   Lower Extremity Assessment Lower Extremity Assessment: Defer to PT evaluation   Cervical / Trunk Assessment Cervical / Trunk Assessment: Normal   Communication Communication Communication: No difficulties   Cognition Arousal/Alertness: Awake/alert Behavior During Therapy: Anxious Overall Cognitive Status: No family/caregiver present to determine baseline cognitive functioning                                 General Comments: anxious about movement, possible pain. very distracted and requires consistent redirection to tasks. perseverating on something touching her calf, asking for orthopedics to come by and re-wrap her leg (noted she had declined this yesterday). Follows directions consistently, self limiting     General Comments  PCA pump in use during session    Exercises     Shoulder Instructions      Home Living Family/patient expects to be discharged to:: Private residence Living Arrangements: Parent Available Help at Discharge: Family;Available 24 hours/day Type of Home: House Home Access: Stairs to enter [employer] of Steps: 2 Entrance Stairs-Rails: None Home Layout: One level     Bathroom Shower/Tub: Teacher, early years/pre: Standard     Home Equipment: Conservation officer, nature (2 wheels);Rollator (4 wheels);Wheelchair - manual;Other (comment);BSC/3in1 (adjustable bed)   Additional Comments: Mother has been providing care since last DC. boyfriend has assisted too but works as Designer, multimedia Prior Level of Function : Needs assist             Mobility Comments: has been using WC since surgery though mostly bedbound recently ADLs Comments: Reports assist for ADLs from mother at this time. reports using disposable bed pads for toileting needs bed level, has not been  getting up to toilet        OT Problem List: Decreased strength;Decreased range of motion;Decreased activity tolerance;Impaired balance (sitting and/or standing);Decreased safety awareness;Decreased knowledge of use of DME or AE;Cardiopulmonary status limiting activity;Pain      OT Treatment/Interventions: Self-care/ADL training;Therapeutic exercise;DME and/or AE instruction;Therapeutic activities;Visual/perceptual remediation/compensation;Patient/family education;Balance training    OT Goals(Current goals can be found in the care plan section) Acute Rehab OT Goals Patient Stated Goal: go home, pain control OT Goal Formulation: With patient Time For Goal Achievement: 03/02/22 Potential to Achieve Goals: Fair  OT Frequency: Min 2X/week    Co-evaluation PT/OT/SLP Co-Evaluation/Treatment: Yes Reason for Co-Treatment: To address functional/ADL transfers;For patient/therapist safety;Other (comment) (pain limited, likely unable to tolerate separate sessions) PT goals addressed during session: Mobility/safety with mobility OT goals addressed during session: ADL's and self-care;Strengthening/ROM      AM-PAC OT "6 Clicks" Daily Activity     Outcome Measure Help from another person eating meals?: None Help from another person taking care of personal grooming?: A Little Help from another person toileting, which includes using toliet, bedpan, or urinal?: Total Help from another person bathing (including washing, rinsing, drying)?: A Lot Help from another person to put  on and taking off regular upper body clothing?: A Little Help from another person to put on and taking off regular lower body clothing?: Total 6 Click Score: 14   End of Session Nurse Communication: Mobility status  Activity Tolerance: Patient limited by pain Patient left: in bed;with call bell/phone within reach;with bed alarm set  OT Visit Diagnosis: Unsteadiness on feet (R26.81);Other abnormalities of gait and mobility  (R26.89);Repeated falls (R29.6);Muscle weakness (generalized) (M62.81);History of falling (Z91.81);Pain Pain - Right/Left: Left Pain - part of body: Leg                Time: 2500-3704 OT Time Calculation (min): 21 min Charges:  OT General Charges $OT Visit: 1 Visit OT Evaluation $OT Eval Moderate Complexity: 1 Mod  Bradd Canary, OTR/L Acute Rehab Services Office: [phone]   Lorre Munroe 02/16/2022, 10:45 AM

## 2022-02-16 NOTE — Progress Notes (Signed)
  Progress Note   Patient: Kerry Young SEG:[id number] DOB: Nov 24, 1965 DOA: 02/12/2022     3 DOS: the patient was seen and examined on 02/16/2022       Brief hospital course: Kerry Young is a 56 y.o. F with migraines, PTSD/anxiety and recent tibial plateau fracture s/p ORIF 7/12 who unfortunately fell about a week after surgery and subsequently had increased pain and drainage from her surgical incision and so returned to the hospital.  In the ER, findings were concerning for postoperative prosthetic infection and so she was taken to the OR for washout.     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 [name] on 7/29  Hardware in place, presumably still seeded.  At present joint is unstable and will require further fixation, TBD by Dr. [name] and patient.  Intraop cultures with MRSA - NWB to LLE - PT eval - Continue daptomycin  - Continue PCA, plan to calculate daily dose this afternoon and transition to oral - At home is on Norco 20 mg daily and Xanax 3 mg daily only - Consult ID, appreciate cares - Lovenox for DVT ppx for now, Ortho mention apixaban at discharge  Normocytic anemia Expected post-operative Hgb change, mild, no clinical bleeding, no further work up at this time  Sinus tachycardia Resolved with fluids, PCA  Chronic pain disorder - Continue home Lyrica - Hold home Norco - See above re; PCA  Obesity (BMI 30-39.9) BMI 33  GAD (generalized anxiety disorder) - Continue home Xanax, Seroquel - EtCO2 while on PCA          Subjective: Patient's pain is better controlled, she still has a lot of pain throughout the left leg.  She had no fever, confusion, respiratory distress.     Physical Exam: Vitals:   02/15/22 2348 02/16/22 0415 02/16/22 0447 02/16/22 0754  BP:    102/82 (!) 133/119  Pulse:   (!) 103 97  Resp:   18 18  Temp:   98.7 F (37.1 C) 98.6 F (37 C)  TempSrc:   Oral   SpO2: 99% 98% 99% 96%  Weight:      Height:       Obese adult female, lying in bed, no acute distress, interactive, pale RRR, no murmurs, no peripheral edema Respiratory rate normal, lungs clear without rales or wheezes Abdomen soft no tenderness palpation or guarding Left leg is wrapped, she has no left foot plantarflexed Attention normal, affect normal, judgment and insight appear normal, face symmetric, speech fluent    Data Reviewed: CBC with mild anemia, no change Base metabolic panel normal      Disposition: Status is: Inpatient The patient was admitted with septic knee joint, postoperative infection of the prosthesis.  She has not had a washout, but heart rate remains in place.  She will need further fixation of the knee, plan to be determined  Once her surgical plan is finalized today, plan to discharge on 6 weeks daptomycin.  Patient expects to discharge home        Author: Alberteen Sam, MD 02/16/2022 8:23 AM  For on call review [email or web address].

## 2022-02-16 NOTE — Progress Notes (Addendum)
Physical Therapy Treatment Patient Details Name: Kerry Young MRN: [id number] DOB: 31-Mar-1966 Today's Date: 02/16/2022   History of Present Illness Pt is a 56 yr old old female who presented after fall from [facility] causing "oozing" from surgical site. Admitted for likely septic L knee joint/hardware. Orthopedics recommended ex fix though pt declined. Underwent I&D of L knee on 7/28. Pt previously  s/p 7/12 ORIF of L bicondylar plateau fx and repair of L lateral meniscus tear. PMH: UTI, anxiety, PONV, sciatica, PTSD, ORIF L ulnar fx, R IM nail of tibial, ORIF of R humerus (Simultaneous filing. User may not have seen previous data.)    PT Comments    Plan for treatment today was to attempt transfer to [facility], pt refusing but agreeable to sit at EOB. Pt required minA for bed mobility to assist with LLE. Pt limited by increased pain and anxiety but benefits from step by step cueing and knowing what to expect. Able to sit EOB for approximately 2-3 min before requensting to return to supine due to reports of increased LLE pain. Pt agreeable to attempt WC transfer next session with therapy. Will continue to follow to progress strength, mobility, and tolerance to activity.     Recommendations for follow up therapy are one component of a multi-disciplinary discharge planning process, led by the attending physician.  Recommendations may be updated based on patient status, additional functional criteria and insurance authorization.  Follow Up Recommendations  Home health PT (pt refusing SNF)     Assistance Recommended at Discharge Frequent or constant Supervision/Assistance  Patient can return home with the following A lot of help with walking and/or transfers;A lot of help with bathing/dressing/bathroom;Assistance with cooking/housework;Assist for transportation;Help with stairs or ramp for entrance   Equipment Recommendations  None recommended by PT    Recommendations for Other Services       Precautions  / Restrictions Precautions Precautions: Fall  Precaution Comments: PCA pump Restrictions Weight Bearing Restrictions: Yes  LLE Weight Bearing: Non weight bearing     Mobility  Bed Mobility Overal bed mobility: Needs Assistance Bed Mobility: Supine to Sit, Sit to Supine     Supine to sit: Min assist Sit to supine: Min assist   General bed mobility comments: minA for LLE, consistent cueing, increased time required due to anxiety    Transfers                   General transfer comment: pt refusing    Ambulation/Gait                   Stairs             Wheelchair Mobility    Modified Rankin (Stroke Patients Only)       Balance   Sitting-balance support: No upper extremity supported, Feet supported Sitting balance-Leahy Scale: Good                                      Cognition                                                Exercises      General Comments        Pertinent Vitals/Pain      Home Living  Prior Function            PT Goals (current goals can now be found in the care plan section) Acute Rehab PT Goals Patient Stated Goal: Go home PT Goal Formulation: With patient Time For Goal Achievement: 03/01/22 Potential to Achieve Goals: Fair Progress towards PT goals: Progressing toward goals    Frequency    Min 3X/week      PT Plan Current plan remains appropriate    Co-evaluation PT/OT/SLP Co-Evaluation/Treatment: Yes Reason for Co-Treatment: To address functional/ADL transfers;For patient/therapist safety;Other (comment) (pain limited, likely unable to tolerate separate sessions  Simultaneous filing. User may not have seen previous data.) PT goals addressed during session: Mobility/safety with mobility OT goals addressed during session: ADL's and self-care;Strengthening/ROM      AM-PAC PT "6 Clicks" Mobility   Outcome Measure  Help  needed turning from your back to your side while in a flat bed without using bedrails?: A Little Help needed moving from lying on your back to sitting on the side of a flat bed without using bedrails?: A Little Help needed moving to and from a bed to a chair (including a wheelchair)?: Total Help needed standing up from a chair using your arms (e.g., wheelchair or bedside chair)?: Total Help needed to walk in hospital room?: Total Help needed climbing 3-5 steps with a railing? : Total 6 Click Score: 10    End of Session         PT Visit Diagnosis: Repeated falls (R29.6);Muscle weakness (generalized) (M62.81);Difficulty in walking, not elsewhere classified (R26.2);Pain Pain - Right/Left: Left Pain - part of body: Leg     Time: 2409-7353 PT Time Calculation (min) (ACUTE ONLY): 23 min  Charges:  $Therapeutic Activity: 8-22 mins                     Davina Poke, SPT Acute Rehabilitation Services  Office: [phone]    Davina Poke 02/16/2022, 10:41 AM

## 2022-02-16 NOTE — Progress Notes (Signed)
[facility]    Date of Admission:  02/12/2022   Total days of antibiotics 5          ID: Kerry Young is a 56 y.o. female with  left tibia plateau fx s/p orif, on 7/12 sustained post surgical fall and now has instability/drainage c/w  septic arthritis s/p I x D of left knee Principal Problem:   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 Active Problems:   GAD (generalized anxiety disorder)   Obesity (BMI 30-39.9)   Chronic pain disorder   Sinus tachycardia   Normocytic anemia    Subjective: Afebrile. Tolerating abtx without difficulty. No diarrhea, nor rash. Left leg splint irritating her  12 point ros is otherwise normal Medications:   acetaminophen  1,000 mg Oral Q8H   Chlorhexidine Gluconate Cloth  6 each Topical Daily   enoxaparin (LOVENOX) injection  40 mg Subcutaneous Q24H   HYDROmorphone   Intravenous Q4H   methocarbamol  1,000 mg Oral TID   ondansetron (ZOFRAN) IV  4 mg Intravenous Once   pregabalin  300 mg Oral BID   QUEtiapine Fumarate  150 mg Oral QHS   sodium chloride flush  10-40 mL Intracatheter Q12H    Objective: Vital signs in last 24 hours: Temp:  [98.4 F (36.9 C)-98.8 F (37.1 C)] 98.6 F (37 C) (07/31 0754) Pulse Rate:  [97-103] 97 (07/31 0754) Resp:  [15-20] 20 (07/31 1216) BP: (95-133)/(70-119) 133/119 (07/31 0754) SpO2:  [92 %-99 %] 97 % (07/31 1216) Physical Exam  Constitutional:  oriented to person, place, and time. appears well-developed and well-nourished. No distress.  HENT: [place]/AT, PERRLA, no scleral icterus Mouth/Throat: Oropharynx is clear and moist. No oropharyngeal exudate.  Cardiovascular: Normal rate, regular rhythm and normal heart sounds. Exam reveals no gallop and no friction rub.  No murmur heard.  Pulmonary/Chest: Effort normal and breath sounds normal. No respiratory distress.  has no wheezes.  XAJ:OINOM UE PICC line is c/d/I; left left wrapped from surgery Lymphadenopathy: no cervical  adenopathy. No axillary adenopathy Neurological: alert and oriented to person, place, and time.  Skin: Skin is warm and dry. No rash noted. No erythema.  Psychiatric: a normal mood and affect.  behavior is normal.    Lab Results Recent Labs    02/15/22 0207 02/16/22 0318  WBC 10.0 10.0  HGB 10.4* 9.7*  HCT 32.4* 31.1*  NA 137 136  K 3.4* 3.7  CL 108 101  CO2 19* 24  BUN 12 13  CREATININE 0.63 0.72    Sedimentation Rate Recent Labs    02/14/22 1457  ESRSEDRATE 62*   C-Reactive Protein Recent Labs    02/14/22 1457  CRP 23.2*    Microbiology:02/13/22-left knee  METHICILLIN RESISTANT STAPHYLOCOCCUS AUREUS   Resulting Agency [facility]     Susceptibility   Methicillin resistant staphylococcus aureus    MIC    CIPROFLOXACIN <=0.5 SENSI... Sensitive    CLINDAMYCIN <=0.25 SENS... Sensitive    ERYTHROMYCIN <=0.25 SENS... Sensitive    GENTAMICIN <=0.5 SENSI... Sensitive    Inducible Clindamycin NEGATIVE  Sensitive    OXACILLIN RESISTANT  Resistant    RIFAMPIN <=0.5 SENSI... Sensitive    TETRACYCLINE <=1 SENSITIVE  Sensitive    TRIMETH/SULFA <=10 SENSIT... Sensitive    VANCOMYCIN <=0.5 SENSI... Sensitive          Studies/Results: [place] EKG SITE RITE  Result Date: 02/15/2022 If Site Rite image not attached, placement could not be  confirmed due to current cardiac rhythm.    Assessment/Plan: Septic arthritis with HW in place (from ORIF on 7/12) - continue on daptomycin, appears to tolerate. Plan for 6 wk with weekly CK and bmp check. Plan to follow up with dr Zenaida Niece dam. She has several intolerances to abtx. Opting for not adding rifampin per dr. Daiva Eves. At follow up, the decision for further chronic abtx suppression to be discussed  Splint discomfort = being redressed  Leukocytosis = improved  Long term medication = baseline ck are wNL    [facility] Pager: [phone]  02/16/2022, 2:09 PM

## 2022-02-16 NOTE — Progress Notes (Signed)
Orthopaedic Trauma Service Progress Note  Patient ID: Kerry Young MRN: [id number] DOB/AGE: 1966/03/28 56 y.o.  Subjective:  No acute issues Relatively calm this morning  Currently using a PCA  Discussed with patient that putting an external fixator on would be better than maintaining her in a long-leg splint.  Think she is at high risk for complications with a long-leg splint.  I do think the fixator would help with pain control given that it is likely going to be a while before any additional surgery could be considered in the setting of her septic joint.  For now she is agreeable  She continues to complain that the splint is very tight.  However splint is quite loose I can get my whole hand underneath the Ace wrap and fitted between the plaster struts  Intraoperative cultures: MRSA   ROS As above Objective:   VITALS:   Vitals:   02/16/22 0415 02/16/22 0447 02/16/22 0754 02/16/22 0859  BP:  102/82 (!) 133/119   Pulse:  (!) 103 97   Resp:  18 18 15   Temp:  98.7 F (37.1 C) 98.6 F (37 C)   TempSrc:  Oral    SpO2: 98% 99% 96% 97%  Weight:      Height:        Estimated body mass index is 33.47 kg/m as calculated from the following:   Height as of this encounter: 5\' 2"  (1.575 m).   Weight as of this encounter: 83 kg.   Intake/Output      07/30 0701 07/31 0700 07/31 0701 08/01 0700   P.O.  300   I.V. (mL/kg) 0.9 (0)    IV Piggyback 62    Total Intake(mL/kg) 62.9 (0.8) 300 (3.6)   Urine (mL/kg/hr)     Drains 80    Total Output 80    Net -17.1 +300          LABS  Results for orders placed or performed during the hospital encounter of 02/12/22 (from the past 24 hour(s))  CBC     Status: Abnormal   Collection Time: 02/16/22  3:18 AM  Result Value Ref Range   WBC 10.0 4.0 - 10.5 K/uL   RBC 3.33 (L) 3.87 - 5.11 MIL/uL   Hemoglobin 9.7 (L) 12.0 - 15.0 g/dL   HCT 02/14/22 (L) 02/18/22 - 61.4 %    MCV 93.4 80.0 - 100.0 fL   MCH 29.1 26.0 - 34.0 pg   MCHC 31.2 30.0 - 36.0 g/dL   RDW 43.1 54.0 - 08.6 %   Platelets 456 (H) 150 - 400 K/uL   nRBC 0.0 0.0 - 0.2 %  Basic metabolic panel     Status: Abnormal   Collection Time: 02/16/22  3:18 AM  Result Value Ref Range   Sodium 136 135 - 145 mmol/L   Potassium 3.7 3.5 - 5.1 mmol/L   Chloride 101 98 - 111 mmol/L   CO2 24 22 - 32 mmol/L   Glucose, Bld 107 (H) 70 - 99 mg/dL   BUN 13 6 - 20 mg/dL   Creatinine, Ser 95.0 0.44 - 1.00 mg/dL   Calcium 9.0 8.9 - 02/18/22 mg/dL   GFR, Estimated 9.32 67.1 mL/min   Anion gap 11 5 - 15     PHYSICAL EXAM:   Gen: No  acute distress, sitting up in bed Lungs: Unlabored Ext:       Left lower extremity  Splint is clean, dry and intact  Ace wraps or not tight at all  Foot with appropriate color  + DP pulse  Drain is patent   I removed the drain without difficulty today.  Patient tolerated quite well  The distal aspect of the long-leg splint does appear to be rubbing against her ankle.  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 Active Problems:   GAD (generalized anxiety disorder)   Obesity (BMI 30-39.9)   Chronic pain disorder   Sinus tachycardia   Normocytic anemia   Anti-infectives (From admission, onward)    Start     Dose/Rate Route Frequency Ordered Stop   02/14/22 1715  DAPTOmycin (CUBICIN) 600 mg in sodium chloride 0.9 % IVPB        600 mg 124 mL/hr over 30 Minutes Intravenous Daily 02/14/22 1618     02/14/22 1330  DAPTOmycin (CUBICIN) 600 mg in sodium chloride 0.9 % IVPB  Status:  Discontinued        600 mg 124 mL/hr over 30 Minutes Intravenous Daily 02/14/22 1236 02/14/22 1618   02/14/22 0400  vancomycin (VANCOREADY) IVPB 1250 mg/250 mL  Status:  Discontinued        1,250 mg 166.7 mL/hr  over 90 Minutes Intravenous Every 24 hours 02/13/22 0347 02/14/22 1150   02/13/22 1715  cefTRIAXone (ROCEPHIN) 2 g in sodium chloride 0.9 % 100 mL IVPB  Status:  Discontinued        2 g 200 mL/hr over 30 Minutes Intravenous Every 24 hours 02/13/22 1627 02/14/22 1150   02/13/22 1416  tobramycin (NEBCIN) powder  Status:  Discontinued          As needed 02/13/22 1416 02/13/22 1454   02/13/22 1414  vancomycin (VANCOCIN) powder  Status:  Discontinued          As needed 02/13/22 1414 02/13/22 1454   02/13/22 0345  vancomycin (VANCOREADY) IVPB 1500 mg/300 mL        1,500 mg 150 mL/hr over 120 Minutes Intravenous  Once 02/13/22 0342 02/13/22 0636     .  POD/HD#: 72  56 year old female septic left knee s/p ORIF bicondylar left tibial plateau fracture  -septic left knee, approximately 2 weeks status post ORIF bicondylar tibial plateau fracture  Nonweightbearing left leg  Patient currently agreeable to external fixator which I do feel would be much better for overall patient care from multiple aspects including pain control and mobility  Plan to return OR on Wednesday  [employer] to trim current long-leg splint  Ice and elevate   - Pain management:  Multimodal  Continue per primary  - DVT/PE prophylaxis:  Lovenox for now  Transition to Eliquis after final surgery during this hospitalization - ID:   Daptomycin per ID service  - Impediments to fracture healing:  Infection  Poor compliance with weightbearing restrictions  - Dispo:  OR Wednesday for external fixator     Mearl Latin, PA-C [phone] (C) 02/16/2022, 10:53 AM  [facility] [address] [place] [address] [phone] [phone] (F)    After 5pm and on the weekends please log on to Amion, go to orthopaedics and the look under the Sports Medicine  Group Call for the provider(s) on call. You can also call our office at [phone] and then follow the prompts to be connected to the  call team.   Patient ID: Kerry Young, female   DOB: 07/30/1965, 56 y.o.   MRN: [id number]

## 2022-02-16 NOTE — TOC Initial Note (Signed)
Transition of Care [facility]) - Initial/Assessment Note    Patient Details  Name: Kerry Young MRN: [id number] Date of Birth: 1965/11/27  [facility] CM/SW Contact:    Bess Kinds, RN Phone Number: [phone] 02/16/2022, 12:01 PM  Clinical Narrative:                  Notified of patient need for long term IV antibiotics. Contacted [facility] concerning previous referral for [facility] PT - services never initiated d/t patient refusal stating she couldn't walk and was bed bound. [facility] declined referral for nursing. [facility], [facility], and [facility] declined referral for nursing for home antibiotics. Spoke with Pam at [facility] - she will arrange for [facility] to do PICC line care and help manage home infusion needs.   Expected Discharge Plan: Home w Home Health Services Barriers to Discharge: Continued Medical Work up   Patient Goals and CMS Choice        Expected Discharge Plan and Services Expected Discharge Plan: Home w Home Health Services                                              Prior Living Arrangements/Services                       Activities of Daily Living Home Assistive Devices/Equipment: Environmental consultant (specify type), Bedside commode/3-in-1 ADL Screening (condition at time of admission) Patient's cognitive ability adequate to safely complete daily activities?: Yes Is the patient deaf or have difficulty hearing?: No Does the patient have difficulty seeing, even when wearing glasses/contacts?: No Does the patient have difficulty concentrating, remembering, or making decisions?: No Patient able to express need for assistance with ADLs?: No Does the patient have difficulty dressing or bathing?: No Independently performs ADLs?: Yes (appropriate for developmental age) Does the patient have difficulty walking or climbing stairs?: No Weakness of Legs: Both Weakness of Arms/Hands: None  Permission Sought/Granted                  Emotional  Assessment              Admission diagnosis:  Septic joint of left knee joint (HCC) [M00.9] Infection of superficial incisional surgical site after procedure, initial encounter [T81.41XA] Patient Active Problem List   Diagnosis Date Noted   Normocytic anemia 02/16/2022   Sinus tachycardia 02/15/2022   Obesity (BMI 30-39.9) 02/14/2022   Chronic pain disorder 02/14/2022   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 [place] [address] Phone: [phone] Fax: [phone]  [facility] [address] [place] [address] Phone: [phone] Fax: [phone]     Social Determinants of Health (SDOH) Interventions    Readmission Risk Interventions    02/21/2021    2:20 PM  Readmission Risk Prevention Plan  Post Dischage Appt Not Complete  Appt Comments DC to SNF  Medication Screening Complete  Transportation Screening Complete

## 2022-02-17 DIAGNOSIS — F411 Generalized anxiety disorder: Secondary | ICD-10-CM | POA: Diagnosis not present

## 2022-02-17 DIAGNOSIS — M00062 Staphylococcal arthritis, left knee: Secondary | ICD-10-CM | POA: Diagnosis not present

## 2022-02-17 DIAGNOSIS — G894 Chronic pain syndrome: Secondary | ICD-10-CM | POA: Diagnosis not present

## 2022-02-17 DIAGNOSIS — E669 Obesity, unspecified: Secondary | ICD-10-CM | POA: Diagnosis not present

## 2022-02-17 MED ORDER — ORAL CARE MOUTH RINSE: 15.0000 mL | OROMUCOSAL | Status: DC | PRN

## 2022-02-17 MED ORDER — HYDROMORPHONE HCL 2 MG PO TABS
3.0000 mg | ORAL_TABLET | ORAL | Status: DC | PRN
  Administered 2022-02-17 – 2022-02-19 (×9): 3 mg via ORAL
  Filled 2022-02-17 (×10): qty 2

## 2022-02-17 MED ORDER — HYDROMORPHONE HCL 1 MG/ML IJ SOLN
0.5000 mg | Freq: Once | INTRAMUSCULAR | Status: AC
  Administered 2022-02-17: 0.5 mg via INTRAVENOUS
  Filled 2022-02-17: qty 0.5

## 2022-02-17 NOTE — Progress Notes (Signed)
PT Cancellation Note  Patient Details Name: Kerry Young MRN: [id number] DOB: [birth year]   Cancelled Treatment:    Reason Eval/Treat Not Completed: Patient declined,  Pt stating she is in too much pain to move. Therapist with attempts to encourage participation but pt continuing to decline. Will follow up as time allows.   Davina Poke, SPT Acute Rehabilitation Services  Office: [phone]   Davina Poke 02/17/2022, 3:00 PM

## 2022-02-17 NOTE — Plan of Care (Signed)

## 2022-02-17 NOTE — Progress Notes (Signed)
Orthopaedic Trauma Service   Pt agreeable for external fixation of left leg  OR tomorrow for ex fix L leg with Dr. [name]  NPO after MN  Obtain consent   Mearl Latin, PA-C [phone] (C) 02/17/2022, 11:13 AM  [facility] [address] [place] [address] [phone] (F)      Patient ID: Kerry Young, female   DOB: August 23, 1965, 56 y.o.   MRN: [id number]

## 2022-02-17 NOTE — Anesthesia Preprocedure Evaluation (Signed)
Anesthesia Evaluation  Patient identified by MRN, date of birth, ID band Patient awake    Reviewed: Allergy & Precautions, NPO status , Patient's Chart, lab work & pertinent test results  History of Anesthesia Complications Negative for: history of anesthetic complications  Airway Mallampati: II  TM Distance: >3 FB Neck ROM: Full    Dental no notable dental hx. (+) Chipped, Dental Advisory Given   Pulmonary neg pulmonary ROS, former smoker,    Pulmonary exam normal breath sounds clear to auscultation       Cardiovascular negative cardio ROS Normal cardiovascular exam Rhythm:Regular Rate:Normal     Neuro/Psych Anxiety Depression negative neurological ROS     GI/Hepatic negative GI ROS, Neg liver ROS,   Endo/Other  negative endocrine ROS  Renal/GU Lab Results      Component                Value               Date                      CREATININE               0.72                02/16/2022                BUN                      13                  02/16/2022                NA                       136                 02/16/2022                K                        3.7                 02/16/2022                negative genitourinary   Musculoskeletal  (+) Arthritis ,   Abdominal   Peds  Hematology  (+) Blood dyscrasia, anemia , Lab Results      Component                Value               Date                      WBC                      10.0                02/16/2022                HGB                      9.7 (L)             02/16/2022                HCT  31.1 (L)            02/16/2022    3                PLT                      456 (H)             02/16/2022              Anesthesia Other Findings  L knee infection and instability s/p ORIF tibial plateau  MRSA  Reproductive/Obstetrics                            Anesthesia Physical  Anesthesia Plan  ASA:  2  Anesthesia Plan: General   Post-op Pain Management: Ketamine IV* and Dilaudid IV   Induction: Intravenous  PONV Risk Score and Plan: 3 and Ondansetron, Dexamethasone, Midazolam and Treatment may vary due to age or medical condition  Airway Management Planned: LMA  Additional Equipment: None  Intra-op Plan:   Post-operative Plan:   Informed Consent: I have reviewed the patients History and Physical, chart, labs and discussed the procedure including the risks, benefits and alternatives for the proposed anesthesia with the patient or authorized representative who has indicated his/her understanding and acceptance.     Dental advisory given  Plan Discussed with:   Anesthesia Plan Comments:        Anesthesia Quick Evaluation

## 2022-02-17 NOTE — Progress Notes (Signed)
Progress Note   Patient: Kerry Young IZT:[id number] DOB: Feb 20, 1966 DOA: 02/12/2022     4 DOS: the patient was seen and examined on 02/17/2022 at 8:55AM      Brief hospital course: Mrs. Reedy is a 56 y.o. F with migraines, PTSD/anxiety and recent tibial plateau fracture s/p ORIF 7/12 who unfortunately fell about a week after surgery and subsequently had increased pain and drainage from her surgical incision and so returned to the hospital.  In the ER, findings were concerning for postoperative prosthetic infection and so she was taken to the OR for washout.   7/29: Admitted and went to OR for washout 7/30: Intraop cultures with MRSA 7/31: Scheduled for external fixation on 8/2     Assessment and Plan: * 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 S/p Incision and drainage of left septic knee, Irrigation and debridement of left postoperative infection, Closed reduction and splinting of left knee by Dr. [name] on 7/29  Intraop cultures with MRSA.  Hardware in place, presumably still seeded.  At present joint is unstable and will require further fixation, plan for this to be by external fixator by Dr. [name] on Wednesday - NWB to LLE - PT eval - Continue daptomycin  - Stop PCA today and transition to oral dilaudid (At home is on Norco 20 mg daily and Xanax 3 mg daily)  - Consult ID, appreciate cares - Lovenox for DVT ppx for now, plan for apixaban DVT ppx after fixation Wednesday     Normocytic anemia Expected post-operative Hgb change, mild, no clinical bleeding, no further work up at this time  Chronic pain disorder - Continue home Lyrica - Continue scheduled acetaminophen - Hold home Norco - Stop PCA and transition to oral Dilaudid  Obesity (BMI 30-39.9) BMI 33  GAD (generalized anxiety disorder) - Continue home Xanax, Seroquel - EtCO2           Subjective: Patient with pain relatively well controlled with PCA last 24 hours, no fever,  respiratory distress     Physical Exam: Vitals:   02/17/22 0400 02/17/22 0500 02/17/22 0737 02/17/22 1022  BP:   98/74   Pulse:   97   Resp: 14  18 17   Temp:   (!) 97.4 F (36.3 C)   TempSrc:   Oral   SpO2: 98%  95% 97%  Weight:  97.9 kg    Height:       Obese adult female, lying in bed, no acute distress, sleepy, arouses sluggishly, sedated by PCA RRR, no murmurs, no peripheral edema Respiratory rate normal, lungs clear without rales or wheezes Face symmetric, speech fluent, moves upper extremities with generalized weakness but symmetric strength Left leg is wrapped in Ace bandage with splint.  Data Reviewed: Basic metabolic panel unremarkable Complete blood count normal other than mild anemia Hep C antibody negative, hepatitis B serologies suggest prior vaccination      Disposition: Status is: Inpatient The patient was admitted for postoperative infection of the left knee after falling on the knee.  She had a washout, but hardware remains.  She will need 6 weeks of IV daptomycin, followed by oral suppression until she is able to have her hardware removed.  PT have recommended SNF, but the patient has refused and plans to go home with [facility] and equipment when she is cleared by Surgery after her external fixation on Wednesday           Author: Monday, MD 02/17/2022 11:28  AM  For on call review [email or web address].

## 2022-02-18 ENCOUNTER — Inpatient Hospital Stay (HOSPITAL_COMMUNITY): Payer: Medicaid Other | Admitting: Anesthesiology

## 2022-02-18 ENCOUNTER — Inpatient Hospital Stay (HOSPITAL_COMMUNITY): Payer: Medicaid Other

## 2022-02-18 ENCOUNTER — Encounter (HOSPITAL_COMMUNITY)
Admission: EM | Disposition: A | Discharge status: Home Health | Payer: Self-pay | Source: Home / Self Care | Attending: Family Medicine

## 2022-02-18 ENCOUNTER — Encounter (HOSPITAL_COMMUNITY): Payer: Self-pay | Admitting: Internal Medicine

## 2022-02-18 ENCOUNTER — Other Ambulatory Visit: Payer: Self-pay

## 2022-02-18 DIAGNOSIS — G894 Chronic pain syndrome: Secondary | ICD-10-CM | POA: Diagnosis not present

## 2022-02-18 DIAGNOSIS — D649 Anemia, unspecified: Secondary | ICD-10-CM | POA: Diagnosis not present

## 2022-02-18 DIAGNOSIS — T8454XA Infection and inflammatory reaction due to internal left knee prosthesis, initial encounter: Secondary | ICD-10-CM

## 2022-02-18 DIAGNOSIS — T84023A Instability of internal left knee prosthesis, initial encounter: Secondary | ICD-10-CM | POA: Diagnosis not present

## 2022-02-18 DIAGNOSIS — M00062 Staphylococcal arthritis, left knee: Secondary | ICD-10-CM | POA: Diagnosis not present

## 2022-02-18 DIAGNOSIS — F411 Generalized anxiety disorder: Secondary | ICD-10-CM | POA: Diagnosis not present

## 2022-02-18 HISTORY — PX: EXTERNAL FIXATION LEG: SHX1549

## 2022-02-18 LAB — BASIC METABOLIC PANEL
Anion gap: 10 (ref 5–15)
BUN: 11 mg/dL (ref 6–20)
CO2: 25 mmol/L (ref 22–32)
Calcium: 8.7 mg/dL — ABNORMAL LOW (ref 8.9–10.3)
Chloride: 102 mmol/L (ref 98–111)
Creatinine, Ser: 0.57 mg/dL (ref 0.44–1.00)
GFR, Estimated: >60 mL/min (ref >60)
Glucose, Bld: 113 mg/dL — ABNORMAL HIGH (ref 70–99)
Potassium: 3.7 mmol/L (ref 3.5–5.1)
Sodium: 137 mmol/L (ref 135–145)

## 2022-02-18 LAB — CBC
HCT: 30.6 % — ABNORMAL LOW (ref 36.0–46.0)
Hemoglobin: 9.8 g/dL — ABNORMAL LOW (ref 12.0–15.0)
MCH: 29.3 pg (ref 26.0–34.0)
MCHC: 32 g/dL (ref 30.0–36.0)
MCV: 91.3 fL (ref 80.0–100.0)
Platelets: 487 10*3/uL — ABNORMAL HIGH (ref 150–400)
RBC: 3.35 MIL/uL — ABNORMAL LOW (ref 3.87–5.11)
RDW: 14.9 % (ref 11.5–15.5)
WBC: 10.7 10*3/uL — ABNORMAL HIGH (ref 4.0–10.5)
nRBC: 0 % (ref 0.0–0.2)

## 2022-02-18 LAB — AEROBIC/ANAEROBIC CULTURE W GRAM STAIN (SURGICAL/DEEP WOUND)

## 2022-02-18 SURGERY — EXTERNAL FIXATION, LOWER EXTREMITY
Anesthesia: General | Laterality: Left

## 2022-02-18 MED ORDER — MIDAZOLAM HCL 5 MG/5ML IJ SOLN
INTRAMUSCULAR | Status: DC | PRN
  Administered 2022-02-18: 2 mg via INTRAVENOUS

## 2022-02-18 MED ORDER — LIDOCAINE 2% (20 MG/ML) 5 ML SYRINGE
INTRAMUSCULAR | Status: DC | PRN
  Administered 2022-02-18: 80 mg via INTRAVENOUS

## 2022-02-18 MED ORDER — DEXAMETHASONE SODIUM PHOSPHATE 10 MG/ML IJ SOLN
INTRAMUSCULAR | Status: DC | PRN
  Administered 2022-02-18: 10 mg via INTRAVENOUS

## 2022-02-18 MED ORDER — DEXAMETHASONE SODIUM PHOSPHATE 10 MG/ML IJ SOLN
INTRAMUSCULAR | Status: AC
  Filled 2022-02-18: qty 1

## 2022-02-18 MED ORDER — VANCOMYCIN HCL 1000 MG IV SOLR
INTRAVENOUS | Status: AC
  Filled 2022-02-18: qty 20

## 2022-02-18 MED ORDER — KETAMINE HCL 50 MG/5ML IJ SOSY
PREFILLED_SYRINGE | INTRAMUSCULAR | Status: AC
  Filled 2022-02-18: qty 5

## 2022-02-18 MED ORDER — LACTATED RINGERS IV SOLN: INTRAVENOUS | Status: DC

## 2022-02-18 MED ORDER — CHLORHEXIDINE GLUCONATE 0.12 % MT SOLN
OROMUCOSAL | Status: AC
  Administered 2022-02-18: 15 mL via OROMUCOSAL
  Filled 2022-02-18: qty 15

## 2022-02-18 MED ORDER — FENTANYL CITRATE (PF) 250 MCG/5ML IJ SOLN
INTRAMUSCULAR | Status: DC | PRN
  Administered 2022-02-18 (×5): 50 ug via INTRAVENOUS

## 2022-02-18 MED ORDER — OXYCODONE HCL 5 MG PO TABS: 5.0000 mg | ORAL_TABLET | Freq: Once | ORAL | Status: DC | PRN

## 2022-02-18 MED ORDER — VANCOMYCIN HCL 1000 MG IV SOLR
INTRAVENOUS | Status: DC | PRN
  Administered 2022-02-18: 1000 mg via TOPICAL

## 2022-02-18 MED ORDER — PROPOFOL 10 MG/ML IV BOLUS
INTRAVENOUS | Status: DC | PRN
  Administered 2022-02-18: 140 mg via INTRAVENOUS
  Administered 2022-02-18: 20 mg via INTRAVENOUS

## 2022-02-18 MED ORDER — SODIUM CHLORIDE 0.9 % IR SOLN
Status: DC | PRN
  Administered 2022-02-18: 3000 mL
  Administered 2022-02-18: 1000 mL

## 2022-02-18 MED ORDER — OXYCODONE HCL 5 MG/5ML PO SOLN: 5.0000 mg | Freq: Once | ORAL | Status: DC | PRN

## 2022-02-18 MED ORDER — MIDAZOLAM HCL 2 MG/2ML IJ SOLN
INTRAMUSCULAR | Status: AC
  Filled 2022-02-18: qty 2

## 2022-02-18 MED ORDER — CHLORHEXIDINE GLUCONATE 0.12 % MT SOLN: 15.0000 mL | Freq: Once | OROMUCOSAL | Status: AC

## 2022-02-18 MED ORDER — OXYCODONE HCL 5 MG PO TABS
5.0000 mg | ORAL_TABLET | Freq: Once | ORAL | Status: AC
  Administered 2022-02-18: 5 mg via ORAL
  Filled 2022-02-18: qty 1

## 2022-02-18 MED ORDER — HYDROMORPHONE HCL 1 MG/ML IJ SOLN
INTRAMUSCULAR | Status: AC
  Filled 2022-02-18: qty 1

## 2022-02-18 MED ORDER — 0.9 % SODIUM CHLORIDE (POUR BTL) OPTIME
TOPICAL | Status: DC | PRN
  Administered 2022-02-18: 1000 mL

## 2022-02-18 MED ORDER — AMISULPRIDE (ANTIEMETIC) 5 MG/2ML IV SOLN: 10.0000 mg | Freq: Once | INTRAVENOUS | Status: DC | PRN

## 2022-02-18 MED ORDER — DEXMEDETOMIDINE HCL IN NACL 80 MCG/20ML IV SOLN
INTRAVENOUS | Status: AC
  Filled 2022-02-18: qty 20

## 2022-02-18 MED ORDER — ACETAMINOPHEN 10 MG/ML IV SOLN
INTRAVENOUS | Status: AC
  Filled 2022-02-18: qty 100

## 2022-02-18 MED ORDER — FENTANYL CITRATE (PF) 250 MCG/5ML IJ SOLN
INTRAMUSCULAR | Status: AC
  Filled 2022-02-18: qty 5

## 2022-02-18 MED ORDER — PROPOFOL 10 MG/ML IV BOLUS
INTRAVENOUS | Status: AC
  Filled 2022-02-18: qty 20

## 2022-02-18 MED ORDER — ONDANSETRON HCL 4 MG/2ML IJ SOLN
INTRAMUSCULAR | Status: AC
  Filled 2022-02-18: qty 2

## 2022-02-18 MED ORDER — LIDOCAINE 2% (20 MG/ML) 5 ML SYRINGE
INTRAMUSCULAR | Status: AC
  Filled 2022-02-18: qty 5

## 2022-02-18 MED ORDER — ONDANSETRON HCL 4 MG/2ML IJ SOLN: 4.0000 mg | Freq: Once | INTRAMUSCULAR | Status: DC | PRN

## 2022-02-18 MED ORDER — PROCHLORPERAZINE EDISYLATE 10 MG/2ML IJ SOLN
5.0000 mg | Freq: Once | INTRAMUSCULAR | Status: AC
Start: 2022-02-18 — End: 2022-02-18
  Administered 2022-02-18: 5 mg via INTRAVENOUS
  Filled 2022-02-18: qty 2

## 2022-02-18 MED ORDER — KETAMINE HCL-SODIUM CHLORIDE 100-0.9 MG/10ML-% IV SOSY
PREFILLED_SYRINGE | INTRAVENOUS | Status: DC | PRN
  Administered 2022-02-18: 30 mg via INTRAVENOUS
  Administered 2022-02-18 (×2): 10 mg via INTRAVENOUS

## 2022-02-18 MED ORDER — ORAL CARE MOUTH RINSE: 15.0000 mL | Freq: Once | OROMUCOSAL | Status: AC

## 2022-02-18 MED ORDER — ACETAMINOPHEN 10 MG/ML IV SOLN
1000.0000 mg | Freq: Once | INTRAVENOUS | Status: DC | PRN
  Administered 2022-02-18: 1000 mg via INTRAVENOUS

## 2022-02-18 MED ORDER — DIPHENHYDRAMINE HCL 50 MG/ML IJ SOLN
INTRAMUSCULAR | Status: DC | PRN
  Administered 2022-02-18: 12.5 mg via INTRAVENOUS

## 2022-02-18 MED ORDER — HYDROMORPHONE HCL 1 MG/ML IJ SOLN
0.2500 mg | INTRAMUSCULAR | Status: DC | PRN
  Administered 2022-02-18 (×2): 0.5 mg via INTRAVENOUS

## 2022-02-18 SURGICAL SUPPLY — 65 items
"Zimmer Blunt half pin " IMPLANT
"Zimmer yellow half pin " IMPLANT
APL PRP STRL LF DISP 70% ISPRP (MISCELLANEOUS) ×1
BAG COUNTER SPONGE SURGICOUNT (BAG) ×2 IMPLANT
BAG SPNG CNTER NS LX DISP (BAG) ×1
BAR EXFX 400X11 NS LF (EXFIX) ×2
BAR GLASS FIBER EXFX 11X400 (EXFIX) ×2 IMPLANT
BIT DRILL CANN MED FLUTE 4.0 (BIT) IMPLANT
BIT DRILL ORANGE 4.0 LONG (BIT) ×1 IMPLANT
BNDG CMPR MED 10X6 ELC LF (GAUZE/BANDAGES/DRESSINGS) ×1
BNDG COHESIVE 4X5 TAN STRL (GAUZE/BANDAGES/DRESSINGS) ×1 IMPLANT
BNDG ELASTIC 4X5.8 VLCR STR LF (GAUZE/BANDAGES/DRESSINGS) ×1 IMPLANT
BNDG ELASTIC 6X10 VLCR STRL LF (GAUZE/BANDAGES/DRESSINGS) ×1 IMPLANT
BNDG ELASTIC 6X5.8 VLCR STR LF (GAUZE/BANDAGES/DRESSINGS) ×1 IMPLANT
BNDG GAUZE DERMACEA FLUFF (GAUZE/BANDAGES/DRESSINGS) ×1
BNDG GAUZE DERMACEA FLUFF 4 (GAUZE/BANDAGES/DRESSINGS) IMPLANT
BNDG GAUZE ELAST 4 BULKY (GAUZE/BANDAGES/DRESSINGS) ×2 IMPLANT
BNDG GZE DERMACEA 4 6PLY (GAUZE/BANDAGES/DRESSINGS) ×1
BRUSH SCRUB EZ PLAIN DRY (MISCELLANEOUS) ×4 IMPLANT
CHLORAPREP W/TINT 26 (MISCELLANEOUS) ×2 IMPLANT
COVER SURGICAL LIGHT HANDLE (MISCELLANEOUS) ×3 IMPLANT
DRAPE C-ARM 42X72 X-RAY (DRAPES) ×1 IMPLANT
DRAPE C-ARMOR (DRAPES) ×2 IMPLANT
DRAPE IMP U-DRAPE 54X76 (DRAPES) ×4 IMPLANT
DRAPE ORTHO SPLIT 77X108 STRL (DRAPES) ×4
DRAPE SURG ORHT 6 SPLT 77X108 (DRAPES) ×2 IMPLANT
DRAPE U-SHAPE 47X51 STRL (DRAPES) ×2 IMPLANT
DRILL CANN 4.0MM (BIT) ×2
DRSG MEPITEL 4X7.2 (GAUZE/BANDAGES/DRESSINGS) ×1 IMPLANT
DRSG XEROFORM 1X8 (GAUZE/BANDAGES/DRESSINGS) ×1 IMPLANT
ELECT REM PT RETURN 9FT ADLT (ELECTROSURGICAL) ×2
ELECTRODE REM PT RTRN 9FT ADLT (ELECTROSURGICAL) ×1 IMPLANT
GAUZE SPONGE 4X4 12PLY STRL (GAUZE/BANDAGES/DRESSINGS) ×2 IMPLANT
GLOVE BIO SURGEON STRL SZ 6.5 (GLOVE) ×6 IMPLANT
GLOVE BIO SURGEON STRL SZ7.5 (GLOVE) ×8 IMPLANT
GLOVE BIOGEL PI IND STRL 6.5 (GLOVE) ×1 IMPLANT
GLOVE BIOGEL PI IND STRL 7.5 (GLOVE) ×1 IMPLANT
GLOVE BIOGEL PI INDICATOR 6.5 (GLOVE) ×1
GLOVE BIOGEL PI INDICATOR 7.5 (GLOVE) ×3
GOWN STRL REUS W/ TWL LRG LVL3 (GOWN DISPOSABLE) ×2 IMPLANT
GOWN STRL REUS W/TWL LRG LVL3 (GOWN DISPOSABLE) ×4
HANDPIECE INTERPULSE COAX TIP (DISPOSABLE) ×2
KIT BASIN OR (CUSTOM PROCEDURE TRAY) ×2 IMPLANT
KIT TURNOVER KIT B (KITS) ×2 IMPLANT
MANIFOLD NEPTUNE II (INSTRUMENTS) ×2 IMPLANT
NEEDLE 22X1 1/2 (OR ONLY) (NEEDLE) IMPLANT
NS IRRIG 1000ML POUR BTL (IV SOLUTION) ×2 IMPLANT
PACK ORTHO EXTREMITY (CUSTOM PROCEDURE TRAY) ×2 IMPLANT
PAD ARMBOARD 7.5X6 YLW CONV (MISCELLANEOUS) ×3 IMPLANT
PADDING CAST COTTON 6X4 STRL (CAST SUPPLIES) ×3 IMPLANT
PIN BLUNT XTRFIX LG 5X160X55MM (EXFIX) ×2 IMPLANT
PIN CLAMP 2BAR 75MM BLUE (EXFIX) ×2 IMPLANT
PIN HALF 5X200X65 BLUNT TIP (EXFIX) ×2 IMPLANT
SET HNDPC FAN SPRY TIP SCT (DISPOSABLE) IMPLANT
SPONGE T-LAP 18X18 ~~LOC~~+RFID (SPONGE) ×1 IMPLANT
STAPLER VISISTAT 35W (STAPLE) ×1 IMPLANT
STRIP CLOSURE SKIN 1/2X4 (GAUZE/BANDAGES/DRESSINGS) IMPLANT
SUT PROLENE 0 CT (SUTURE) IMPLANT
TOWEL GREEN STERILE (TOWEL DISPOSABLE) ×3 IMPLANT
TOWEL GREEN STERILE FF (TOWEL DISPOSABLE) ×4 IMPLANT
TUBE CONNECTING 20X1/4 (TUBING) ×1 IMPLANT
UNDERPAD 30X36 HEAVY ABSORB (UNDERPADS AND DIAPERS) ×1 IMPLANT
YANKAUER SUCT BULB TIP NO VENT (SUCTIONS) ×1 IMPLANT
Zimmer Blunt half pin ×2 IMPLANT
Zimmer yellow half pin ×2 IMPLANT

## 2022-02-18 NOTE — Op Note (Signed)
Orthopaedic Surgery Operative Note (CSN: [id number] ) Date of Surgery: 02/18/2022  Admit Date: 02/12/2022   Diagnoses: Pre-Op Diagnoses: Left septic knee and postoperative infection Left knee instability  Post-Op Diagnosis: Same  Procedures: CPT 20690-External fixation of left knee CPT 10180-Irrigation and debridement of left knee  Surgeons : Primary: Sonia Bromell, Gillie Manners, MD  Assistant: Youlanda Roys, RNFA  Location: OR 7   Anesthesia:General   Antibiotics: Scheduled Daptomycin with 1 gm vancomycin powder placed topically   Tourniquet time:None   Estimated Blood Loss:50 mL  Complications:None    Specimens:None  Implants: Implant Name Type Inv. Item Serial No. Manufacturer Lot No. LRB No. Used Action  Zimmer Blunt half pin     ZIMMER  Left 2 Implanted  Zimmer yellow half pin     ZIMMER  Left 2 Implanted     Indications for Surgery: 56 year old female who underwent open reduction internal fixation of her left bicondylar tibial plateau fracture.  She developed a postoperative infection and was taken for irrigation debridement.  She also had knee instability which she was placed in a splint after she initially refused a external fixator.  After further discussion regarding stabilization and pain control she was agreeable to proceed with external fixation.  Risks and benefits were discussed with the patient.  She agreed to proceed with surgery and consent was obtained.  Operative Findings: 1.  Repeat irrigation debridement of left knee and proximal tibia 2.  Spanning knee external fixation using large Zimmer [employer] Xtra fix  Procedure: The patient was identified in the preoperative holding area. Consent was confirmed with the patient and their family and all questions were answered. The operative extremity was marked after confirmation with the patient. she was then brought back to the operating room by our anesthesia colleagues.  She was carefully transferred over to radiolucent  flat top table.  She was placed under general anesthetic.  The left lower extremity was then prepped and draped in usual sterile fashion.  A timeout was performed to verify the patient, the procedure, and the extremity.    There was serous drainage from her wound so I felt that repeat irrigation and debridement was appropriate.  We opened up the incision and encountered some murky appearing fluid.  I removed this and I used a low-pressure pulsatile lavage to thoroughly irrigated with approximately 4 L of normal saline.  I then changed my gloves and instruments and turned my attention to external fixation.  I placed percutaneous 5.0 mm threaded half pins into the femur to gain bicortical fixation.  I attached a pin clamp to these.  I confirmed position and placement with fluoroscopy.  I then repeated the process in the tibia distal to the plate.  I then connected the pin clamps to 11 mm bars.  Tightened after reduction maneuver was performed on the knee.  Final fluoroscopic imaging was obtained.  The incisions were irrigated and closed with 3-0 nylon.  Sterile dressing consisting of Mepitel, 4 x 4 sterile cast padding and Ace wrap was placed.  The pin sites were dressed with Kerlix.  The patient was then awoken from anesthesia and taken to the PACU in stable condition.  Post Op Plan/Instructions: Patient be nonweightbearing to left lower extremity.  She will receive her scheduled daptomycin.  We will continue to mobilize with physical and Occupational Therapy.  I was present and performed the entire surgery.Truitt Merle, MD [facility]

## 2022-02-18 NOTE — Progress Notes (Signed)
PROGRESS NOTE    Kerry Young  BPZ:[id number] DOB: 1966/01/10 DOA: 02/12/2022 PCP: Nathaneil Canary, PA-C   Brief Narrative: Kerry Young is a 56 y.o. female with a history of PTSD, anxiety, chronic pain and tibial plateau fracture s/p ORIF.  Patient presented secondary to pain at her incision site with evidence of septic arthritis secondary to MRSA infection. Patient managed with antibiotics and surgical debridement.   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 Patient is s/p incision and drainage/ irrigation and debridement of left knee/ closed reduction and splinting. Intraoperative wound cultures significant for MRSA. ID consulted. Patient transitioned to Daptomycin with recommendations for Daptomycin IV x6 weeks. ID signed off on 7/30. -Orthopedic surgery recommendations: external fixation planned for today, 8/2 -Continue dilaudid PO prn  Normocytic anemia Hemoglobin of 12 on admission with acute drop likely secondary to perioperative blood loss with resultant acute blood loss anemia. Hemoglobin stable.  Chronic pain disorder Patient is on chronic opiates as an outpatient. Patient transitioned to a PCA pump this admission which was discontinued on 8/1. She is now being managed on dilaudid PO. -Continue scheduled Tylenol and Lyrica -Continue dilaudid PO prn  Obesity (BMI 30-39.9) Body mass index is 39.48 kg/m.  GAD (generalized anxiety disorder) - Continue home Xanax and Seroquel    DVT prophylaxis: Lovenox Code Status:   Code Status: Full Code Family Communication: None at bedside Disposition Plan: Discharge home vs to SNF with plan for IV antibiotics pending orthopedic surgery recommendations for discharge readiness   Consultants:  Orthopedic surgery Infectious disease  Procedures:  Orthopedic surgery (02/13/2022) Incision and drainage of left septic knee Irrigation  and debridement of left postoperative infection Closed reduction and splinting of left knee   Antimicrobials: Vancomycin Ceftriaxone Daptomycin    Subjective: Patient reports having pain  Objective: BP (!) 130/95 (BP Location: Right Arm)   Pulse (!) 108   Temp 99 F (37.2 C)   Resp (!) 24   Ht 5\' 2"  (1.575 m)   Wt 97.9 kg   SpO2 93%   BMI 39.48 kg/m   Examination:  General exam: No distress Respiratory system: Clear to auscultation. Respiratory effort normal. Cardiovascular system: S1 & S2 heard, RRR. No murmurs, rubs, gallops or clicks. Gastrointestinal system: Abdomen is nondistended, soft and nontender. No organomegaly or masses felt. Normal bowel sounds heard. Central nervous system: Alert and oriented. No focal neurological deficits. Musculoskeletal: Left leg dressing intact. Some mild LE edema noted. Skin: No cyanosis. No rashes Psychiatry: Judgement and insight appear normal. Tearful    Data Reviewed: I have personally reviewed following labs and imaging studies  CBC Lab Results  Component Value Date   WBC 10.7 (H) 02/18/2022   RBC 3.35 (L) 02/18/2022   HGB 9.8 (L) 02/18/2022   HCT 30.6 (L) 02/18/2022   MCV 91.3 02/18/2022   MCH 29.3 02/18/2022   PLT 487 (H) 02/18/2022   MCHC 32.0 02/18/2022   RDW 14.9 02/18/2022   LYMPHSABS 1.6 02/13/2022   MONOABS 1.2 (H) 02/13/2022   EOSABS 0.1 02/13/2022   BASOSABS 0.0 02/13/2022     Last metabolic panel Lab Results  Component Value Date   NA 137 02/18/2022   K 3.7 02/18/2022   CL 102 02/18/2022   CO2 25 02/18/2022   BUN 11 02/18/2022   CREATININE 0.57 02/18/2022   GLUCOSE 113 (H) 02/18/2022   GFRNONAA >60 02/18/2022   GFRAA >  60 07/01/2019   CALCIUM 8.7 (L) 02/18/2022   PROT 6.0 (L) 02/12/2021   ALBUMIN 2.7 (L) 02/12/2021   BILITOT 1.5 (H) 02/12/2021   ALKPHOS 120 02/12/2021   AST 20 02/12/2021   ALT 22 02/12/2021   ANIONGAP 10 02/18/2022    GFR: Estimated Creatinine Clearance: 86.8 mL/min  (by C-G formula based on SCr of 0.57 mg/dL).  Recent Results (from the past 240 hour(s))  Aerobic/Anaerobic Culture w Gram Stain (surgical/deep wound)     Status: None (Preliminary result)   Collection Time: 02/13/22  2:02 PM   Specimen: PATH Soft tissue  Result Value Ref Range Status   Specimen Description WOUND  Final   Special Requests LEFT KNEE INFECTION  Final   Gram Stain   Final    ABUNDANT WBC PRESENT, PREDOMINANTLY PMN FEW GRAM POSITIVE COCCI Performed at [facility], [address] [place], [place] [address]    Culture   Final    MODERATE METHICILLIN RESISTANT STAPHYLOCOCCUS AUREUS NO ANAEROBES ISOLATED; CULTURE IN PROGRESS FOR 5 DAYS    Report Status PENDING  Incomplete   Organism ID, Bacteria METHICILLIN RESISTANT STAPHYLOCOCCUS AUREUS  Final      Susceptibility   Methicillin resistant staphylococcus aureus - MIC*    CIPROFLOXACIN <=0.5 SENSITIVE Sensitive     ERYTHROMYCIN <=0.25 SENSITIVE Sensitive     GENTAMICIN <=0.5 SENSITIVE Sensitive     OXACILLIN RESISTANT Resistant     TETRACYCLINE <=1 SENSITIVE Sensitive     VANCOMYCIN <=0.5 SENSITIVE Sensitive     TRIMETH/SULFA <=10 SENSITIVE Sensitive     CLINDAMYCIN <=0.25 SENSITIVE Sensitive     RIFAMPIN <=0.5 SENSITIVE Sensitive     Inducible Clindamycin NEGATIVE Sensitive     * MODERATE METHICILLIN RESISTANT STAPHYLOCOCCUS AUREUS  Aerobic/Anaerobic Culture w Gram Stain (surgical/deep wound)     Status: None (Preliminary result)   Collection Time: 02/13/22  2:03 PM   Specimen: PATH Soft tissue  Result Value Ref Range Status   Specimen Description WOUND  Final   Special Requests LEFT KNEE INFECTION  Final   Gram Stain   Final    ABUNDANT WBC PRESENT, PREDOMINANTLY PMN RARE GRAM POSITIVE COCCI IN PAIRS Performed at [facility], [address] [place], [place] [address]    Culture   Final    MODERATE STAPHYLOCOCCUS AUREUS SUSCEPTIBILITIES PERFORMED ON PREVIOUS CULTURE WITHIN THE LAST 5 DAYS. NO  ANAEROBES ISOLATED; CULTURE IN PROGRESS FOR 5 DAYS    Report Status PENDING  Incomplete      Radiology Studies: DG Tibia/Fibula Left  Result Date: 02/18/2022 CLINICAL DATA:  Ex fix left leg. EXAM: LEFT TIBIA AND FIBULA - 2 VIEW COMPARISON:  None Available. FINDINGS: Reported radiation dose: 1 mGy. Fluoro time: 15 seconds. Three C-arm fluoroscopic images were obtained intraoperatively and submitted for post operative interpretation. These images partially demonstrate plate screw fixation of the proximal tibia. Please see the performing provider's procedural report for further detail. IMPRESSION: Intraoperative fluoroscopy, as above. Electronically Signed   By: Feliberto Harts M.D.   On: 02/18/2022 12:30   DG C-Arm 1-60 Min-No Report  Result Date: 02/18/2022 Fluoroscopy was utilized by the requesting physician.  No radiographic interpretation.      LOS: 5 days    Jacquelin Hawking, MD [facility] 02/18/2022, 12:55 PM   If 7PM-7AM, please contact night-coverage [email or web address]

## 2022-02-18 NOTE — Progress Notes (Signed)
OT Cancellation Note  Patient Details Name: Kerry Young MRN: [id number] DOB: Apr 30, 1966   Cancelled Treatment:    Reason Eval/Treat Not Completed: Patient at procedure or test/ unavailable (off floor in OR for external fixation). OT to check back 8/3.   Kallie Edward OTR/L Supplemental OT, Department of rehab services [phone]  Arleny Kruger R H. 02/18/2022, 11:42 AM

## 2022-02-18 NOTE — Addendum Note (Signed)
Addendum  created 02/18/22 1526 by Trevor Iha, MD   Order list changed, Pharmacy for encounter modified

## 2022-02-18 NOTE — Anesthesia Postprocedure Evaluation (Signed)
Anesthesia Post Note  Patient: Kerry Young  Procedure(s) Performed: EXTERNAL FIXATION LEG (Left)     Patient location during evaluation: PACU Anesthesia Type: General Level of consciousness: awake and alert Pain management: pain level controlled Vital Signs Assessment: post-procedure vital signs reviewed and stable Respiratory status: spontaneous breathing, nonlabored ventilation, respiratory function stable and patient connected to nasal cannula oxygen Cardiovascular status: blood pressure returned to baseline and stable Postop Assessment: no apparent nausea or vomiting Anesthetic complications: no   No notable events documented.  Last Vitals:  Vitals:   02/18/22 1300 02/18/22 1310  BP: 110/78 110/78  Pulse: 94 98  Resp: (!) 21 20  Temp:  37.2 C  SpO2: 95% 94%    Last Pain:  Vitals:   02/18/22 1310  TempSrc:   PainSc: Asleep                 Trevor Iha

## 2022-02-18 NOTE — Transfer of Care (Signed)
Immediate Anesthesia Transfer of Care Note  Patient: Kerry Young  Procedure(s) Performed: EXTERNAL FIXATION LEG (Left)  Patient Location: PACU  Anesthesia Type:General  Level of Consciousness: awake, oriented and patient cooperative  Airway & Oxygen Therapy: Patient Spontanous Breathing and Patient connected to nasal cannula oxygen  Post-op Assessment: Report given to RN and Post -op Vital signs reviewed and stable  Post vital signs: Reviewed  Last Vitals:  Vitals Value Taken Time  BP 130/95 02/18/22 1240  Temp 37.2 C 02/18/22 1240  Pulse 106 02/18/22 1245  Resp 22 02/18/22 1245  SpO2 94 % 02/18/22 1245  Vitals shown include unvalidated device data.  Last Pain:  Vitals:   02/18/22 1240  TempSrc:   PainSc: 10-Worst pain ever      Patients Stated Pain Goal: 6 (02/17/22 1939)  Complications: No notable events documented.

## 2022-02-18 NOTE — Anesthesia Procedure Notes (Signed)
Procedure Name: LMA Insertion Date/Time: 02/18/2022 11:34 AM  Performed by: Lovie Chol, CRNAPre-anesthesia Checklist: Patient identified, Emergency Drugs available, Suction available and Patient being monitored Patient Re-evaluated:Patient Re-evaluated prior to induction Oxygen Delivery Method: Circle System Utilized Preoxygenation: Pre-oxygenation with 100% oxygen Induction Type: IV induction Ventilation: Mask ventilation without difficulty LMA: LMA inserted LMA Size: 4.0 Number of attempts: 1 Airway Equipment and Method: Bite block Placement Confirmation: positive ETCO2 and breath sounds checked- equal and bilateral Tube secured with: Tape Dental Injury: Teeth and Oropharynx as per pre-operative assessment

## 2022-02-18 NOTE — Interval H&P Note (Signed)
History and Physical Interval Note:  02/18/2022 10:29 AM  Kerry Young  has presented today for surgery, with the diagnosis of Septic left knee, left tibial plateau fracture.  The various methods of treatment have been discussed with the patient and family. After consideration of risks, benefits and other options for treatment, the patient has consented to  Procedure(s): EXTERNAL FIXATION LEG (Left) as a surgical intervention.  The patient's history has been reviewed, patient examined, no change in status, stable for surgery.  I have reviewed the patient's chart and labs.  Questions were answered to the patient's satisfaction.     Caryn Bee P Barabara Motz

## 2022-02-19 ENCOUNTER — Encounter (HOSPITAL_COMMUNITY): Payer: Self-pay | Admitting: Student

## 2022-02-19 DIAGNOSIS — G894 Chronic pain syndrome: Secondary | ICD-10-CM | POA: Diagnosis not present

## 2022-02-19 DIAGNOSIS — D649 Anemia, unspecified: Secondary | ICD-10-CM | POA: Diagnosis not present

## 2022-02-19 DIAGNOSIS — M00062 Staphylococcal arthritis, left knee: Secondary | ICD-10-CM | POA: Diagnosis not present

## 2022-02-19 DIAGNOSIS — F411 Generalized anxiety disorder: Secondary | ICD-10-CM | POA: Diagnosis not present

## 2022-02-19 MED ORDER — METHOCARBAMOL 750 MG PO TABS
750.0000 mg | ORAL_TABLET | Freq: Four times a day (QID) | ORAL | Status: DC | PRN
Start: 2022-02-19 — End: 2022-02-24
  Administered 2022-02-19 – 2022-02-23 (×12): 750 mg via ORAL
  Filled 2022-02-19 (×13): qty 1

## 2022-02-19 MED ORDER — HYDROMORPHONE HCL 2 MG PO TABS
4.0000 mg | ORAL_TABLET | ORAL | Status: DC | PRN
  Administered 2022-02-20: 4 mg via ORAL
  Filled 2022-02-19: qty 2

## 2022-02-19 MED ORDER — HYDROMORPHONE HCL 1 MG/ML IJ SOLN
1.0000 mg | INTRAMUSCULAR | Status: DC | PRN
  Administered 2022-02-19 – 2022-02-21 (×11): 1 mg via INTRAVENOUS
  Filled 2022-02-19 (×11): qty 1

## 2022-02-19 MED ORDER — ALPRAZOLAM 0.5 MG PO TABS
1.0000 mg | ORAL_TABLET | Freq: Three times a day (TID) | ORAL | Status: DC
  Administered 2022-02-19 – 2022-02-23 (×15): 1 mg via ORAL
  Filled 2022-02-19 (×15): qty 2

## 2022-02-19 MED ORDER — ACETAMINOPHEN 325 MG PO TABS
650.0000 mg | ORAL_TABLET | Freq: Four times a day (QID) | ORAL | Status: DC
Start: 2022-02-19 — End: 2022-02-24
  Administered 2022-02-19 – 2022-02-23 (×17): 650 mg via ORAL
  Filled 2022-02-19 (×17): qty 2

## 2022-02-19 MED ORDER — POLYETHYLENE GLYCOL 3350 17 G PO PACK
17.0000 g | PACK | Freq: Two times a day (BID) | ORAL | Status: DC
Start: 2022-02-19 — End: 2022-02-24
  Administered 2022-02-19 – 2022-02-23 (×5): 17 g via ORAL
  Filled 2022-02-19 (×9): qty 1

## 2022-02-19 NOTE — Progress Notes (Signed)
PROGRESS NOTE    Kerry Young  JHE:[id number] DOB: 1965/09/04 DOA: 02/12/2022 PCP: Kerry Canary, PA-C   Brief Narrative: Kerry Young is a 56 y.o. female with a history of PTSD, anxiety, chronic pain and tibial plateau fracture s/p ORIF.  Patient presented secondary to pain at her incision site with evidence of septic arthritis secondary to MRSA infection. Patient managed with antibiotics and surgical debridement.   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 Patient is s/p incision and drainage/ irrigation and debridement of left knee/ closed reduction and splinting. Intraoperative wound cultures significant for MRSA. ID consulted. Patient transitioned to Daptomycin with recommendations for Daptomycin IV x6 weeks. ID signed off on 7/30. External fixation performed on 8/2. -Orthopedic surgery recommendations: PT/OT, follow-up in 2 weeks  Normocytic anemia Hemoglobin of 12 on admission with acute drop likely secondary to perioperative blood loss with resultant acute blood loss anemia. Hemoglobin stable.  Chronic pain disorder Patient is on chronic opiates as an outpatient. Patient transitioned to a PCA pump this admission which was discontinued on 8/1. She is now being managed on dilaudid PO. -Continue scheduled Tylenol and Lyrica -Dilaudid per orthopedic surgery  Obesity (BMI 30-39.9) Body mass index is 39.48 kg/m.  GAD (generalized anxiety disorder) - Continue home Xanax and Seroquel    DVT prophylaxis: Lovenox Code Status:   Code Status: Full Code Family Communication: None at bedside Disposition Plan: Discharge home (patient states she will decline SNF) with plan for IV antibiotics pending orthopedic surgery and PT/OT recommendations for discharge readiness   Consultants:  Orthopedic surgery Infectious disease  Procedures:  Orthopedic surgery (02/13/2022) Incision and  drainage of left septic knee Irrigation and debridement of left postoperative infection Closed reduction and splinting of left knee   Antimicrobials: Vancomycin Ceftriaxone Daptomycin    Subjective: Continues to have pain. No other concerns.  Objective: BP 125/82   Pulse 96   Temp 97.7 F (36.5 C) (Oral)   Resp 19   Ht 5\' 2"  (1.575 m)   Wt 97.9 kg   SpO2 94%   BMI 39.48 kg/m   Examination:  General exam: Appears calm and comfortable Respiratory system: Respiratory effort normal. Gastrointestinal system: Abdomen is distended Central nervous system: Alert and oriented. No focal neurological deficits. Musculoskeletal: No calf tenderness. External fixation device noted. Skin: No cyanosis. No rashes Psychiatry: Judgement and insight appear normal. Mood & affect appropriate.    Data Reviewed: I have personally reviewed following labs and imaging studies  CBC Lab Results  Component Value Date   WBC 10.7 (H) 02/18/2022   RBC 3.35 (L) 02/18/2022   HGB 9.8 (L) 02/18/2022   HCT 30.6 (L) 02/18/2022   MCV 91.3 02/18/2022   MCH 29.3 02/18/2022   PLT 487 (H) 02/18/2022   MCHC 32.0 02/18/2022   RDW 14.9 02/18/2022   LYMPHSABS 1.6 02/13/2022   MONOABS 1.2 (H) 02/13/2022   EOSABS 0.1 02/13/2022   BASOSABS 0.0 02/13/2022     Last metabolic panel Lab Results  Component Value Date   NA 137 02/18/2022   K 3.7 02/18/2022   CL 102 02/18/2022   CO2 25 02/18/2022   BUN 11 02/18/2022   CREATININE 0.57 02/18/2022   GLUCOSE 113 (H) 02/18/2022   GFRNONAA >60 02/18/2022   GFRAA >60 07/01/2019   CALCIUM 8.7 (L) 02/18/2022   PROT 6.0 (L) 02/12/2021   ALBUMIN 2.7 (L) 02/12/2021   BILITOT  1.5 (H) 02/12/2021   ALKPHOS 120 02/12/2021   AST 20 02/12/2021   ALT 22 02/12/2021   ANIONGAP 10 02/18/2022    GFR: Estimated Creatinine Clearance: 86.8 mL/min (by C-G formula based on SCr of 0.57 mg/dL).  Recent Results (from the past 240 hour(s))  Aerobic/Anaerobic Culture w Gram  Stain (surgical/deep wound)     Status: None   Collection Time: 02/13/22  2:02 PM   Specimen: PATH Soft tissue  Result Value Ref Range Status   Specimen Description WOUND  Final   Special Requests LEFT KNEE INFECTION  Final   Gram Stain   Final    ABUNDANT WBC PRESENT, PREDOMINANTLY PMN FEW GRAM POSITIVE COCCI    Culture   Final    MODERATE METHICILLIN RESISTANT STAPHYLOCOCCUS AUREUS NO ANAEROBES ISOLATED Performed at [facility], [address], [place], [place] [address]    Report Status 02/18/2022 FINAL  Final   Organism ID, Bacteria METHICILLIN RESISTANT STAPHYLOCOCCUS AUREUS  Final      Susceptibility   Methicillin resistant staphylococcus aureus - MIC*    CIPROFLOXACIN <=0.5 SENSITIVE Sensitive     ERYTHROMYCIN <=0.25 SENSITIVE Sensitive     GENTAMICIN <=0.5 SENSITIVE Sensitive     OXACILLIN RESISTANT Resistant     TETRACYCLINE <=1 SENSITIVE Sensitive     VANCOMYCIN <=0.5 SENSITIVE Sensitive     TRIMETH/SULFA <=10 SENSITIVE Sensitive     CLINDAMYCIN <=0.25 SENSITIVE Sensitive     RIFAMPIN <=0.5 SENSITIVE Sensitive     Inducible Clindamycin NEGATIVE Sensitive     * MODERATE METHICILLIN RESISTANT STAPHYLOCOCCUS AUREUS  Aerobic/Anaerobic Culture w Gram Stain (surgical/deep wound)     Status: None   Collection Time: 02/13/22  2:03 PM   Specimen: PATH Soft tissue  Result Value Ref Range Status   Specimen Description WOUND  Final   Special Requests LEFT KNEE INFECTION  Final   Gram Stain   Final    ABUNDANT WBC PRESENT, PREDOMINANTLY PMN RARE GRAM POSITIVE COCCI IN PAIRS    Culture   Final    MODERATE STAPHYLOCOCCUS AUREUS SUSCEPTIBILITIES PERFORMED ON PREVIOUS CULTURE WITHIN THE LAST 5 DAYS. NO ANAEROBES ISOLATED Performed at [facility], [address] [place], [place] [address]    Report Status 02/18/2022 FINAL  Final      Radiology Studies: DG Tibia/Fibula Left  Result Date: 02/18/2022 CLINICAL DATA:  Ex fix left leg. EXAM: LEFT TIBIA AND FIBULA  - 2 VIEW COMPARISON:  None Available. FINDINGS: Reported radiation dose: 1 mGy. Fluoro time: 15 seconds. Three C-arm fluoroscopic images were obtained intraoperatively and submitted for post operative interpretation. These images partially demonstrate plate screw fixation of the proximal tibia. Please see the performing provider's procedural report for further detail. IMPRESSION: Intraoperative fluoroscopy, as above. Electronically Signed   By: Feliberto Harts M.D.   On: 02/18/2022 12:30   DG C-Arm 1-60 Min-No Report  Result Date: 02/18/2022 Fluoroscopy was utilized by the requesting physician.  No radiographic interpretation.      LOS: 6 days    Jacquelin Hawking, MD [facility] 02/19/2022, 10:54 AM   If 7PM-7AM, please contact night-coverage [email or web address]

## 2022-02-19 NOTE — Progress Notes (Signed)
PHARMACY CONSULT NOTE FOR:  OUTPATIENT  PARENTERAL ANTIBIOTIC THERAPY (OPAT)  Indication: MRSA septic knee with hardware Regimen: Daptomycin 600 mg IV q24h End date: 04/01/22  IV antibiotic discharge orders are pended. To discharging provider:  please sign these orders via discharge navigator,  Select New Orders & click on the button choice - Manage This Unsigned Work.     Thank you for allowing pharmacy to be a part of this patient's care.  Georgina Pillion, PharmD, BCPS Infectious Diseases Clinical Pharmacist 02/19/2022 11:22 AM   **Pharmacist phone directory can now be found on [email or web address] ([email or web address]).  Listed under [facility].

## 2022-02-19 NOTE — Progress Notes (Signed)
Orthopaedic Trauma Progress Note  S: Patient in severe pain.  She is complaining that she is not receiving enough pain medication.  Her PCA was stopped 2 days ago due to hypoxia.  She does not have any IV breakthrough pain she is receiving oral Dilaudid 3 mg.  Her Xanax as is as needed.  She states that she feels that she is not getting the when she has for it.  She is crying in pain this morning.  O:  Vitals:   02/18/22 1704 02/18/22 1927  BP: 132/88 120/78  Pulse: (!) 103 (!) 102  Resp:  19  Temp: 97.9 F (36.6 C) 99.4 F (37.4 C)  SpO2: 96% 95%   Patient crying in pain significant discomfort but appropriate to questions and is able to converse appropriately. Left lower extremity: Ex-Fix is in place and is clean dry and intact.  The dressing is in tact and clean.  She is compartments are soft compressible.  She has severe pain in her foot when I try to passively dorsiflex her.  She is not able to actively dorsiflex her foot or ankle.  Imaging: None performed  Labs: No results found for this or any previous visit (from the past 24 hour(s)).  Assessment: 56 year old female with complex postoperative infection with septic arthritis and failed fixation status post external fixation and I&D x2   Weightbearing: Nonweightbearing left lower extremity  Insicional and dressing care: Keep dressing in place until tomorrow.  We will change it.  Orthopedic device(s): Recommend fabricating the footplate for the external fixator flexion to hopefully help with some of the pain.  CV/Blood loss: Hemodynamically stable.  Pain management: Made some adjustments this morning including increasing her p.o. Dilaudid to 4 mg every 4 hours changed her Xanax to scheduled 3 times daily used her frequency of as needed Robaxin as well as scheduled Tylenol change this from the 1000 mg to 650 every 6 hours.  I have also added IV Dilaudid every 3 hours for breakthrough pain.  Hopefully she will improve over the next 24  to 48 hours in terms of her pain.  VTE prophylaxis: Lovenox for DVT prophylaxis  ID: Daptomycin for MRSA postoperative infection  Medical co-morbidities: Chronic pain and anxiety  Dispo: PT and OT evaluation with need for IV antibiotics  Follow - up plan: 2 weeks postoperative.  Roby Lofts, MD [facility] [phone] (office) [email or web address]

## 2022-02-19 NOTE — Progress Notes (Addendum)
Physical Therapy Treatment Patient Details Name: Kerry Young MRN: [id number] DOB: Sep 13, 1965 Today's Date: 02/19/2022   History of Present Illness Pt is a 56 yr old old female who presented after fall from [facility] causing "oozing" from surgical site. Admitted for likely septic L knee joint/hardware. Orthopedics recommended ex fix though pt declined. Underwent I&D of L knee on 7/28. Pt previously  s/p 7/12 ORIF of L bicondylar plateau fx and repair of L lateral meniscus tear. PMH: UTI, anxiety, PONV, sciatica, PTSD, ORIF L ulnar fx, R IM nail of tibial, ORIF of R humerus. External fixator and I & D of left knee 08/02.    PT Comments    Pt with poor tolerance to bed mobility attempts. Pt able to roll to each side but not willing to attempt supine to sit. Will continue as tolerated.   Recommendations for follow up therapy are one component of a multi-disciplinary discharge planning process, led by the attending physician.  Recommendations may be updated based on patient status, additional functional criteria and insurance authorization.  Follow Up Recommendations  Home health PT (pt refusing SNF)     Assistance Recommended at Discharge Frequent or constant Supervision/Assistance  Patient can return home with the following A lot of help with walking and/or transfers;A lot of help with bathing/dressing/bathroom;Assistance with cooking/housework;Assist for transportation;Help with stairs or ramp for entrance   Equipment Recommendations  None recommended by PT    Recommendations for Other Services       Precautions / Restrictions Precautions Precautions: Fall Precaution Comments: PCA pump; external fixator in place; contact precautions Restrictions Weight Bearing Restrictions: Yes LLE Weight Bearing: Non weight bearing     Mobility  Bed Mobility Overal bed mobility: Needs Assistance Bed Mobility: Rolling Rolling: Min assist, +2 for safety/equipment, Supervision         General bed  mobility comments: Pt found supine in bed with sheets soaked in urine. Pt had to roll to each side to change sheets and bed pad. Pt required increased time to complete tasks 2/2 fear and pain. Pt screaming in pain throughout. Pt refused to attempt EOB.    Transfers                   General transfer comment: unable    Ambulation/Gait                   Stairs             Wheelchair Mobility    Modified Rankin (Stroke Patients Only)       Balance                                            Cognition Arousal/Alertness: Awake/alert Behavior During Therapy: Anxious Overall Cognitive Status: No family/caregiver present to determine baseline cognitive functioning                                 General Comments: anxious about movement, possible pain. Limited sustained attention.        Exercises      General Comments General comments (skin integrity, edema, etc.): 94% SpO2 and 92 HR on RA      Pertinent Vitals/Pain Pain Assessment Pain Assessment: 0-10 Pain Score: 9  Pain Location: LLE Pain Descriptors / Indicators: Grimacing, Moaning Pain Intervention(s): Limited  activity within patient's tolerance, Monitored during session, Premedicated before session    Home Living                          Prior Function            PT Goals (current goals can now be found in the care plan section) Acute Rehab PT Goals Patient Stated Goal: Go home PT Goal Formulation: With patient Time For Goal Achievement: 03/01/22 Potential to Achieve Goals:  (guarded) Progress towards PT goals: Not progressing toward goals - comment (limited by pain and fear)    Frequency    Min 3X/week      PT Plan Current plan remains appropriate       AM-PAC PT "6 Clicks" Mobility   Outcome Measure  Help needed turning from your back to your side while in a flat bed without using bedrails?: A Little Help needed moving from  lying on your back to sitting on the side of a flat bed without using bedrails?: A Lot Help needed moving to and from a bed to a chair (including a wheelchair)?: Total Help needed standing up from a chair using your arms (e.g., wheelchair or bedside chair)?: Total Help needed to walk in hospital room?: Total Help needed climbing 3-5 steps with a railing? : Total 6 Click Score: 9    End of Session Equipment Utilized During Treatment:  (none) Activity Tolerance: Patient limited by fatigue Patient left: in bed;with call bell/phone within reach;with bed alarm set Nurse Communication: Mobility status PT Visit Diagnosis: Repeated falls (R29.6);Muscle weakness (generalized) (M62.81);Difficulty in walking, not elsewhere classified (R26.2);Pain Pain - Right/Left: Left Pain - part of body: Leg     Time: 5093-2671 PT Time Calculation (min) (ACUTE ONLY): 20 min  Charges:  $Therapeutic Activity: 8-22 mins                     Tana Coast, PT    [employer] 02/19/2022, 11:03 AM

## 2022-02-20 DIAGNOSIS — D649 Anemia, unspecified: Secondary | ICD-10-CM | POA: Diagnosis not present

## 2022-02-20 DIAGNOSIS — M00062 Staphylococcal arthritis, left knee: Secondary | ICD-10-CM | POA: Diagnosis not present

## 2022-02-20 DIAGNOSIS — G894 Chronic pain syndrome: Secondary | ICD-10-CM | POA: Diagnosis not present

## 2022-02-20 DIAGNOSIS — F411 Generalized anxiety disorder: Secondary | ICD-10-CM | POA: Diagnosis not present

## 2022-02-20 LAB — HEPATIC FUNCTION PANEL
ALT: 97 U/L — ABNORMAL HIGH (ref 0–44)
AST: 90 U/L — ABNORMAL HIGH (ref 15–41)
Albumin: 2.4 g/dL — ABNORMAL LOW (ref 3.5–5.0)
Alkaline Phosphatase: 179 U/L — ABNORMAL HIGH (ref 38–126)
Bilirubin, Direct: <0.1 mg/dL (ref 0.0–0.2)
Total Bilirubin: 0.3 mg/dL (ref 0.3–1.2)
Total Protein: 6.5 g/dL (ref 6.5–8.1)

## 2022-02-20 LAB — CK: Total CK: 24 U/L — ABNORMAL LOW (ref 38–234)

## 2022-02-20 MED ORDER — DAPTOMYCIN IV (FOR PTA / DISCHARGE USE ONLY): 600.0000 mg | INTRAVENOUS | 0 refills | Status: AC

## 2022-02-20 MED ORDER — OXYCODONE HCL 5 MG PO TABS
10.0000 mg | ORAL_TABLET | ORAL | Status: DC | PRN
  Administered 2022-02-20: 20 mg via ORAL
  Administered 2022-02-20: 10 mg via ORAL
  Administered 2022-02-21: 20 mg via ORAL
  Administered 2022-02-21 (×2): 10 mg via ORAL
  Administered 2022-02-21 (×2): 20 mg via ORAL
  Administered 2022-02-21: 10 mg via ORAL
  Administered 2022-02-22 – 2022-02-23 (×11): 20 mg via ORAL
  Filled 2022-02-20 (×7): qty 4
  Filled 2022-02-20 (×2): qty 2
  Filled 2022-02-20 (×2): qty 4
  Filled 2022-02-20: qty 2
  Filled 2022-02-20: qty 4
  Filled 2022-02-20: qty 2
  Filled 2022-02-20 (×5): qty 4

## 2022-02-20 MED ORDER — RIFAMPIN 300 MG PO CAPS
300.0000 mg | ORAL_CAPSULE | Freq: Two times a day (BID) | ORAL | Status: DC
  Administered 2022-02-20 – 2022-02-23 (×8): 300 mg via ORAL
  Filled 2022-02-20 (×8): qty 1

## 2022-02-20 NOTE — Progress Notes (Signed)
OT Note  Pt seen for splint check. No areas of pressure noted. Pt asking for straps to be loosened. Recommend removing splint as needed for ROM. Encourage pt to sleep in splint. Please contact OT with any conerns @ [phone]. Will follow up tomorrow.     Media Information    Media Information   Document Information  Photos    02/20/2022 17:16  Attached To:  Hospital Encounter on 02/12/22   Source Information  Virna Livengood, Lorinda Creed, [place]  [facility]     Document Information  Photos    02/20/2022 17:16  Attached To:  Hospital Encounter on 02/12/22   Source Information  Kazim Corrales, Lorinda Creed, OT  [facility]

## 2022-02-20 NOTE — Progress Notes (Signed)
    [facility]    Date of Admission:  02/12/2022   Total days of antibiotics 9          ID: Kerry Young is a 56 y.o. 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 afebrile  Medications:   acetaminophen  650 mg Oral Q6H   ALPRAZolam  1 mg Oral TID   Chlorhexidine Gluconate Cloth  6 each Topical Daily   enoxaparin (LOVENOX) injection  40 mg Subcutaneous Q24H   polyethylene glycol  17 g Oral BID   pregabalin  300 mg Oral BID   QUEtiapine Fumarate  150 mg Oral QHS   rifampin  300 mg Oral Q12H   sodium chloride flush  10-40 mL Intracatheter Q12H    Objective: Vital signs in last 24 hours: Temp:  [98 F (36.7 C)-98.1 F (36.7 C)] 98.1 F (36.7 C) (08/04 0548) Pulse Rate:  [88-98] 88 (08/04 0744) Resp:  [17-18] 18 (08/04 0548) BP: (100-125)/(69-97) 100/69 (08/04 0744) SpO2:  [96 %-97 %] 96 % (08/04 0744) Weight:  [96.3 kg] 96.3 kg (08/04 0744) Physical Exam  Constitutional:  oriented to person, place, and time. appears well-developed and well-nourished. No distress.  HENT: [place]/AT, PERRLA, no scleral icterus Mouth/Throat: Oropharynx is clear and moist. No oropharyngeal exudate.  Cardiovascular: Normal rate, regular rhythm and normal heart sounds. Exam reveals no gallop and no friction rub.  No murmur heard.  Pulmonary/Chest: Effort normal and breath sounds normal. No respiratory distress.  has no wheezes.  IRC:VELF leg external fixator in place. Serous fluid  Skin: Skin is warm and dry. No rash noted. No erythema.  Psychiatric: a normal mood and affect.  behavior is normal.    Lab Results Recent Labs    02/18/22 0325  WBC 10.7*  HGB 9.8*   HCT 30.6*  NA 137  K 3.7  CL 102  CO2 25  BUN 11  CREATININE 0.57   Liver Panel Recent Labs    02/20/22 0450  PROT 6.5  ALBUMIN 2.4*  AST 90*  ALT 97*  ALKPHOS 179*  BILITOT 0.3  BILIDIR <0.1  IBILI NOT CALCULATED   Lab Results  Component Value Date   ESRSEDRATE 62 (H) 02/14/2022    Microbiology: reviewed Studies/Results: No results found.   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 Gave expectations of se of medication.  Will get baseline hepatic panel. Continue to monitor for tolerance.  [facility] Pager: [phone]  02/20/2022, 1:54 PM

## 2022-02-20 NOTE — Progress Notes (Signed)
Occupational Therapy Treatment Patient Details Name: Kerry Young MRN: [id number] DOB: 1965/08/31 Today's Date: 02/20/2022   History of present illness Pt is a 56 yr old old female [facility]. Found to have septic L knee and postop infection s/p I&D 7/28. External fixation 8/2. Pt previously admitted after a fall 7/12 with L bicondylar plateau fx s/p ORIF. Refused SNF rehab and d/c'd to mothers home. PMH: UTI, anxiety, PONV, sciatica, PTSD, ORIF L ulnar fx, R IM nail of tibial, ORIF of R humerus. External fixator and I & D of left knee 08/02.   OT comments  Met lying supine in bed. Coordinated premedication with RN. Patient receiving IV dilaudid 1hr prior to OT treatment session. Patient continues to be limited by pain, anxiety and questionable expectations of the rehabilitation process. Patient refusing all bed level, EOB and OOB activity this date and only in agreement with transition from supine to long sitting with HOB elevated. This Probation officer emphasized the importance of mobilization to decrease risk of skin breakdown, facilitate soft tissue elongation and prevent further medical complications. Patient states that she plans to return home tomorrow via EMS non-emergency transport and remain in bed for 6-8 weeks. States that she will bathe/dress/toilet at bedlevel with PRN assist from her boyfriend who is a Programmer, systems. States that this is how she healed when she had a similar injury last year. Patient with no desire to practice transfer to wc or [facility] as she both at home. OT will continue to follow acutely.    Recommendations for follow up therapy are one component of a multi-disciplinary discharge planning process, led by the attending physician.  Recommendations may be updated based on patient status, additional functional criteria and insurance authorization.    Follow Up Recommendations  Skilled nursing-short term rehab (<3 hours/day) (Refusing SNF)    Assistance  Recommended at Discharge Frequent or constant Supervision/Assistance  Patient can return home with the following  A lot of help with walking and/or transfers;A lot of help with bathing/dressing/bathroom;Assist for transportation;Direct supervision/assist for financial management;Direct supervision/assist for medications management;Assistance with cooking/housework   Equipment Recommendations  Hospital bed    Recommendations for Other Services      Precautions / Restrictions Precautions Precautions: Fall Precaution Comments: External fixation; contact precautions Restrictions Weight Bearing Restrictions: Yes LLE Weight Bearing: Non weight bearing       Mobility Bed Mobility Overal bed mobility: Needs Assistance             General bed mobility comments: Refused    Transfers                   General transfer comment: Refused     Balance                                           ADL either performed or assessed with clinical judgement   ADL Overall ADL's : Needs assistance/impaired Eating/Feeding: Set up;Bed level   Grooming: Set up;Bed level   Upper Body Bathing: Moderate assistance;Bed level   Lower Body Bathing: Total assistance;Bed level   Upper Body Dressing : Moderate assistance;Bed level   Lower Body Dressing: Total assistance;Bed level     Toilet Transfer Details (indicate cue type and reason): Refused           General ADL Comments: Grossly non-participatory; limited by pain; demonstrates self limiting  behaviors.    Extremity/Trunk Assessment              Vision       Perception     Praxis      Cognition Arousal/Alertness: Awake/alert Behavior During Therapy: Anxious Overall Cognitive Status: No family/caregiver present to determine baseline cognitive functioning                                 General Comments: High anxiety; grossly non-participatory this date.        Exercises       Shoulder Instructions       General Comments Time spent educating patient on importance of mobilization. Patient with questionable expectations of the recovery process. Will continue education during subsequent treatment sessions.    Pertinent Vitals/ Pain       Pain Assessment Pain Assessment: 0-10 Pain Score: 10-Worst pain ever Pain Location: LLE Pain Descriptors / Indicators: Grimacing, Moaning Pain Intervention(s): Monitored during session, Premedicated before session (IV dilaudid 1hr prior to this writers arrival)  Home Living                                          Prior Functioning/Environment              Frequency  Min 2X/week        Progress Toward Goals  OT Goals(current goals can now be found in the care plan section)  Progress towards OT goals: Not progressing toward goals - comment  Acute Rehab OT Goals Patient Stated Goal: To return home tomorrow OT Goal Formulation: With patient Time For Goal Achievement: 03/02/22 Potential to Achieve Goals: Fair ADL Goals Pt Will Perform Grooming: with modified independence;sitting Pt Will Perform Upper Body Bathing: with modified independence;sitting;bed level Pt Will Perform Lower Body Bathing: with modified independence;with min assist;sit to/from stand Pt Will Transfer to Toilet: with min assist;with transfer board;squat pivot transfer;bedside commode Pt Will Perform Tub/Shower Transfer: with mod assist;Squat pivot transfer;shower seat;rolling walker Additional ADL Goal #1: Pt to demo ability to sit EOB > 7 min during functional tasks without significant pain increase Additional ADL Goal #2: Pt to demo ability to gather ADL/IADL items from w/c level with Supervision to maximize participation with environment  Plan Discharge plan remains appropriate;Frequency remains appropriate    Co-evaluation                 AM-PAC OT "6 Clicks" Daily Activity     Outcome Measure   Help  from another person eating meals?: None Help from another person taking care of personal grooming?: A Little Help from another person toileting, which includes using toliet, bedpan, or urinal?: Total Help from another person bathing (including washing, rinsing, drying)?: A Lot Help from another person to put on and taking off regular upper body clothing?: A Little Help from another person to put on and taking off regular lower body clothing?: Total 6 Click Score: 14    End of Session    OT Visit Diagnosis: Unsteadiness on feet (R26.81);Other abnormalities of gait and mobility (R26.89);Repeated falls (R29.6);Muscle weakness (generalized) (M62.81);History of falling (Z91.81);Pain Pain - Right/Left: Left Pain - part of body: Leg   Activity Tolerance Patient limited by pain   Patient Left in bed;with call bell/phone within reach;with bed alarm set   Nurse Communication Mobility status  Time: 9167-5612 OT Time Calculation (min): 18 min  Charges: OT General Charges $OT Visit: 1 Visit OT Treatments $Therapeutic Activity: 8-22 mins  Lavonte Palos H. OTR/L Supplemental OT, Department of rehab services [phone]  Bud Kaeser R H. 02/20/2022, 10:59 AM

## 2022-02-20 NOTE — Progress Notes (Signed)
Occupational Therapy Note  Pt seen for fabrication of L footplate. Groaning and crying out in pain despite being premedicated. Once distracted by conversation, pt tolerated without issues. Splint significantly improves position of foot. Pt reports increased comfort with splint. Pt may remove splint as needed to move foot. If not wearing splint, recommend shortening bed length and fold pillows against footboard to support foot. Discussed with nsg. Will follow.     02/20/22 1600  OT Visit Information  Last OT Received On 02/20/22  History of Present Illness Pt is a 56 yr old old female [facility]. Found to have septic L knee and postop infection s/p I&D 7/28. External fixation 8/2. Pt previously admitted after a fall 7/12 with L bicondylar plateau fx s/p ORIF. Refused SNF rehab and d/c'd to mothers home. PMH: UTI, anxiety, PONV, sciatica, PTSD, ORIF L ulnar fx, R IM nail of tibial, ORIF of R humerus. External fixator and I & D of left knee 08/02.  Precautions  Precautions Fall  Precaution Comments External fixation; contact precautions  Required Braces or Orthoses Other Brace (footplate)  Restrictions  LLE Weight Bearing NWB  Pain Assessment  Pain Assessment 0-10  Pain Score 10  Pain Location LLE  Pain Descriptors / Indicators Grimacing;Moaning  Pain Intervention(s) Premedicated before session;Limited activity within patient's tolerance;Relaxation  Cognition  Arousal/Alertness Awake/alert  Behavior During Therapy Anxious  Overall Cognitive Status No family/caregiver present to determine baseline cognitive functioning  General Comments  General comments (skin integrity, edema, etc.) footplate fabricated; pt does not attempt to dorsiflex foot and foot hanging in plantar flexion. Tolerated splinting well when distracted by conversation. Educated pt on purpose of splint and that splint may be released so she can move her foot as needed.  OT - End of Session  Activity  Tolerance Patient tolerated treatment well  Patient left in bed;with call bell/phone within reach  Nurse Communication Other (comment) (splint care)  OT Assessment/Plan  OT Plan Discharge plan remains appropriate;Frequency remains appropriate  OT Visit Diagnosis Unsteadiness on feet (R26.81);Other abnormalities of gait and mobility (R26.89);Repeated falls (R29.6);Muscle weakness (generalized) (M62.81);History of falling (Z91.81);Pain  Pain - Right/Left Left  Pain - part of body Leg  OT Frequency (ACUTE ONLY) Min 2X/week  Follow Up Recommendations Skilled nursing-short term rehab (<3 hours/day)  Assistance recommended at discharge Frequent or constant Supervision/Assistance  Patient can return home with the following A lot of help with walking and/or transfers;A lot of help with bathing/dressing/bathroom;Assist for transportation;Direct supervision/assist for financial management;Direct supervision/assist for medications management;Assistance with cooking/housework  OT Equipment Hospital bed  AM-PAC OT "6 Clicks" Daily Activity Outcome Measure (Version 2)  Help from another person eating meals? 4  Help from another person taking care of personal grooming? 3  Help from another person toileting, which includes using toliet, bedpan, or urinal? 1  Help from another person bathing (including washing, rinsing, drying)? 2  Help from another person to put on and taking off regular upper body clothing? 3  Help from another person to put on and taking off regular lower body clothing? 1  6 Click Score 14  Progressive Mobility  What is the highest level of mobility based on the progressive mobility assessment? Level 1 (Bedfast) - Unable to balance while sitting on edge of bed  Activity  (seen for splint fabrication)  OT Goal Progression  Progress towards OT goals  (goal established)  Acute Rehab OT Goals  Patient Stated Goal to go home with her boyfriend  OT  Goal Formulation With patient  Time For  Goal Achievement 03/02/22  Potential to Achieve Goals Fair  ADL Goals  Pt Will Perform Grooming with modified independence;sitting  Pt Will Perform Upper Body Bathing with modified independence;sitting;bed level  Pt Will Perform Lower Body Bathing with modified independence;with min assist;sit to/from stand  Pt Will Transfer to Toilet with min assist;with transfer board;squat pivot transfer;bedside commode  Pt Will Perform Tub/Shower Transfer with mod assist;Squat pivot transfer;shower seat;rolling walker  Additional ADL Goal #1 Pt to demo ability to sit EOB > 7 min during functional tasks without significant pain increase  Additional ADL Goal #2 Pt to demo ability to gather ADL/IADL items from w/c level with Supervision to maximize participation with environment  OT Time Calculation  OT Start Time (ACUTE ONLY) 1435  OT Stop Time (ACUTE ONLY) 1519  OT Time Calculation (min) 44 min  OT General Charges  $OT Visit 1 Visit  OT Treatments  $Therapeutic Activity 8-22 mins  $Orthotics Fit/Training 23-37 mins   Luisa Dago, OT/L   Acute OT Clinical Specialist Acute Rehabilitation Services Pager [phone] Office [phone]

## 2022-02-20 NOTE — Progress Notes (Signed)
Patient ID: Kerry Young, female   DOB: 13-Jan-1966, 56 y.o.   MRN: [id number]   LOS: 7 days   Subjective: Continues in severe pain though med change yesterday helped quite a bit.   Objective: Vital signs in last 24 hours: Temp:  [98 F (36.7 C)-98.1 F (36.7 C)] 98.1 F (36.7 C) (08/04 0548) Pulse Rate:  [88-98] 88 (08/04 0744) Resp:  [17-18] 18 (08/04 0548) BP: (100-125)/(69-97) 100/69 (08/04 0744) SpO2:  [96 %-97 %] 96 % (08/04 0744) Weight:  [96.3 kg] 96.3 kg (08/04 0744) Last BM Date : 02/19/22   Laboratory  CBC Recent Labs    02/18/22 0325  WBC 10.7*  HGB 9.8*  HCT 30.6*  PLT 487*   BMET Recent Labs    02/18/22 0325  NA 137  K 3.7  CL 102  CO2 25  GLUCOSE 113*  BUN 11  CREATININE 0.57  CALCIUM 8.7*     Physical Exam General appearance: alert and no distress LLE: Incision C/D/I. 2+ DP. Minimal active ROM ankle/toes.   Assessment/Plan: Left septic knee s/p I&D -- Continue present management. Stressed importance of using oral pain meds and only using IV for breakthrough.    Freeman Caldron, PA-C Orthopedic Surgery [phone] 02/20/2022

## 2022-02-20 NOTE — Progress Notes (Signed)
Pharmacy Antibiotic Note  Kerry Young is a 56 y.o. female admitted on 02/12/2022 with  septic knee and hardware associated infection .  Pharmacy has been consulted for Daptomycin dosing.  7/29 CK: 15 8/04 CK: 24  Plan: Daptomycin 600 mg IV q24h (~9.5 mg/kg adjusted body weight) Monitor renal function and CPK weekly OPAT orders added, EOT 04/01/22  Height: 5\' 2"  (157.5 cm) Weight: 96.3 kg (212 lb 4.9 oz) IBW/kg (Calculated) : 50.1 kg AdjBW: 63 kg  Temp (24hrs), Avg:98.1 F (36.7 C), Min:98 F (36.7 C), Max:98.1 F (36.7 C)  Recent Labs  Lab 02/14/22 0130 02/15/22 0207 02/16/22 0318 02/18/22 0325  WBC 16.8* 10.0 10.0 10.7*  CREATININE 0.59 0.63 0.72 0.57    Estimated Creatinine Clearance: 86 mL/min (by C-G formula based on SCr of 0.57 mg/dL).    Allergies  Allergen Reactions   Amitriptyline Other (See Comments)    Cold sores on mouth, blister on tongue. No facial swelling    Celexa [Citalopram] Nausea And Vomiting    Took it 2 days. Had n/v and migraine   Naproxen Other (See Comments)   Citalopram Nausea And Vomiting   Naproxen Other (See Comments)    UNK Reaction   Ondansetron Hcl Other (See Comments)    Headache  Headache   Other Nausea And Vomiting    ALL ANTIBIOTICS   Zofran [Ondansetron] Other (See Comments)    Antimicrobials this admission: Vancomycin 7/28 - 7/29 Ceftriaxone 7/28 x1 Daptomycin 7/29 >>   Microbiology results: 7/28 Wound Cx x2: moderate staph aureus    Thank you for allowing pharmacy to be a part of this patient's care.  8/28, PharmD Clinical Pharmacist  _______________________________________________________________

## 2022-02-20 NOTE — Progress Notes (Deleted)
Pharmacy Antibiotic Note  Kerry Young is a 56 y.o. female admitted on 02/12/2022 with  septic knee and hardware associated infection .  Pharmacy has been consulted for Daptomycin dosing.  CK 15 7/29 CK 24 8/4  Plan: Daptomycin 600 mg IV q24h (~9.5 mg/kg Adjusted body weight)  through 04/01/22 OPAT orders entered.  Monitor renal function and CPK weekly Follow cultures and susceptibilities  Height: 5\' 2"  (157.5 cm) Weight: 96.3 kg (212 lb 4.9 oz) IBW/kg (Calculated) : 50.1 kg AdjBW: 63 kg  Temp (24hrs), Avg:98.1 F (36.7 C), Min:98 F (36.7 C), Max:98.1 F (36.7 C)  Recent Labs  Lab 02/14/22 0130 02/15/22 0207 02/16/22 0318 02/18/22 0325  WBC 16.8* 10.0 10.0 10.7*  CREATININE 0.59 0.63 0.72 0.57     Estimated Creatinine Clearance: 86 mL/min (by C-G formula based on SCr of 0.57 mg/dL).    Allergies  Allergen Reactions   Amitriptyline Other (See Comments)    Cold sores on mouth, blister on tongue. No facial swelling    Celexa [Citalopram] Nausea And Vomiting    Took it 2 days. Had n/v and migraine   Naproxen Other (See Comments)   Citalopram Nausea And Vomiting   Naproxen Other (See Comments)    UNK Reaction   Ondansetron Hcl Other (See Comments)    Headache  Headache   Other Nausea And Vomiting    ALL ANTIBIOTICS   Zofran [Ondansetron] Other (See Comments)    Antimicrobials this admission: Vancomycin 7/28 - 7/29 Ceftriaxone 7/28 x1  Daptomycin 7/29 >> (9/13)  Microbiology results: 7/28 Wound Cx x2: moderate staph aureus     Kerry Young A. 8/28, PharmD, BCPS, FNKF Clinical Pharmacist [place] Please utilize Amion for appropriate phone number to reach the unit pharmacist [facility])  _______________________________________________________________

## 2022-02-20 NOTE — Progress Notes (Signed)
Pt verbalized that Oxycodone 10mg  was effective for pain. Patient rated pain 3/10

## 2022-02-20 NOTE — Progress Notes (Signed)
PROGRESS NOTE    Kerry Young  ZOX:[id number] DOB: 1966/03/25 DOA: 02/12/2022 PCP: Nathaneil Canary, PA-C   Brief Narrative: Kerry Young is a 56 y.o. female with a history of PTSD, anxiety, chronic pain and tibial plateau fracture s/p ORIF.  Patient presented secondary to pain at her incision site with evidence of septic arthritis secondary to MRSA infection. Patient managed with antibiotics and surgical debridement.   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 Patient is s/p incision and drainage/ irrigation and debridement of left knee/ closed reduction and splinting. Intraoperative wound cultures significant for MRSA. ID consulted. Patient transitioned to Daptomycin with recommendations for Daptomycin IV x6 weeks. ID signed off on 7/30. External fixation performed on 8/2. -Orthopedic surgery recommendations: PT/OT, follow-up in 2 weeks -Analgesics per orthopedic surgery  Normocytic anemia Hemoglobin of 12 on admission with acute drop likely secondary to perioperative blood loss with resultant acute blood loss anemia. Hemoglobin stable.  Chronic pain disorder Patient is on chronic opiates as an outpatient. Patient transitioned to a PCA pump this admission which was discontinued on 8/1. She is now being managed on dilaudid PO. -Continue scheduled Tylenol and Lyrica -Dilaudid per orthopedic surgery  Obesity (BMI 30-39.9) Body mass index is 39.48 kg/m.  GAD (generalized anxiety disorder) -Continue home Xanax and Seroquel    DVT prophylaxis: Lovenox Code Status:   Code Status: Full Code Family Communication: None at bedside Disposition Plan: Discharge home (patient states she will decline SNF) with plan for IV antibiotics pending orthopedic surgery and PT/OT recommendations for discharge readiness   Consultants:  Orthopedic surgery Infectious disease  Procedures:  Orthopedic  surgery (02/13/2022) Incision and drainage of left septic knee Irrigation and debridement of left postoperative infection Closed reduction and splinting of left knee   Antimicrobials: Vancomycin Ceftriaxone Daptomycin    Subjective: Left leg pain. Patient states she is going to refuse PT/OT today because of her pain. She states she needs to go home over the weekend because her boyfriend will be home. Her plan is to stay in bed for 6-8 weeks and then start therapy. She states she has done this previously and that she was able to rehab well.  Objective: BP 100/69 (BP Location: Right Arm)   Pulse 88   Temp 98.1 F (36.7 C) (Oral)   Resp 18   Ht 5\' 2"  (1.575 m)   Wt 96.3 kg   SpO2 96%   BMI 38.83 kg/m   Examination:  General exam: Appears calm and comfortable  Respiratory system: Respiratory effort normal. Gastrointestinal system: Abdomen is non-distended Central nervous system: Alert and oriented.     Data Reviewed: I have personally reviewed following labs and imaging studies  CBC Lab Results  Component Value Date   WBC 10.7 (H) 02/18/2022   RBC 3.35 (L) 02/18/2022   HGB 9.8 (L) 02/18/2022   HCT 30.6 (L) 02/18/2022   MCV 91.3 02/18/2022   MCH 29.3 02/18/2022   PLT 487 (H) 02/18/2022   MCHC 32.0 02/18/2022   RDW 14.9 02/18/2022   LYMPHSABS 1.6 02/13/2022   MONOABS 1.2 (H) 02/13/2022   EOSABS 0.1 02/13/2022   BASOSABS 0.0 02/13/2022     Last metabolic panel Lab Results  Component Value Date   NA 137 02/18/2022   K 3.7 02/18/2022   CL 102 02/18/2022   CO2 25 02/18/2022   BUN 11 02/18/2022   CREATININE 0.57 02/18/2022  GLUCOSE 113 (H) 02/18/2022   GFRNONAA >60 02/18/2022   GFRAA >60 07/01/2019   CALCIUM 8.7 (L) 02/18/2022   PROT 6.0 (L) 02/12/2021   ALBUMIN 2.7 (L) 02/12/2021   BILITOT 1.5 (H) 02/12/2021   ALKPHOS 120 02/12/2021   AST 20 02/12/2021   ALT 22 02/12/2021   ANIONGAP 10 02/18/2022    GFR: Estimated Creatinine Clearance: 86 mL/min  (by C-G formula based on SCr of 0.57 mg/dL).  Recent Results (from the past 240 hour(s))  Aerobic/Anaerobic Culture w Gram Stain (surgical/deep wound)     Status: None   Collection Time: 02/13/22  2:02 PM   Specimen: PATH Soft tissue  Result Value Ref Range Status   Specimen Description WOUND  Final   Special Requests LEFT KNEE INFECTION  Final   Gram Stain   Final    ABUNDANT WBC PRESENT, PREDOMINANTLY PMN FEW GRAM POSITIVE COCCI    Culture   Final    MODERATE METHICILLIN RESISTANT STAPHYLOCOCCUS AUREUS NO ANAEROBES ISOLATED Performed at [facility], [address], [place], [place] [address]    Report Status 02/18/2022 FINAL  Final   Organism ID, Bacteria METHICILLIN RESISTANT STAPHYLOCOCCUS AUREUS  Final      Susceptibility   Methicillin resistant staphylococcus aureus - MIC*    CIPROFLOXACIN <=0.5 SENSITIVE Sensitive     ERYTHROMYCIN <=0.25 SENSITIVE Sensitive     GENTAMICIN <=0.5 SENSITIVE Sensitive     OXACILLIN RESISTANT Resistant     TETRACYCLINE <=1 SENSITIVE Sensitive     VANCOMYCIN <=0.5 SENSITIVE Sensitive     TRIMETH/SULFA <=10 SENSITIVE Sensitive     CLINDAMYCIN <=0.25 SENSITIVE Sensitive     RIFAMPIN <=0.5 SENSITIVE Sensitive     Inducible Clindamycin NEGATIVE Sensitive     * MODERATE METHICILLIN RESISTANT STAPHYLOCOCCUS AUREUS  Aerobic/Anaerobic Culture w Gram Stain (surgical/deep wound)     Status: None   Collection Time: 02/13/22  2:03 PM   Specimen: PATH Soft tissue  Result Value Ref Range Status   Specimen Description WOUND  Final   Special Requests LEFT KNEE INFECTION  Final   Gram Stain   Final    ABUNDANT WBC PRESENT, PREDOMINANTLY PMN RARE GRAM POSITIVE COCCI IN PAIRS    Culture   Final    MODERATE STAPHYLOCOCCUS AUREUS SUSCEPTIBILITIES PERFORMED ON PREVIOUS CULTURE WITHIN THE LAST 5 DAYS. NO ANAEROBES ISOLATED Performed at [facility], [address] [place], [place] [address]    Report Status 02/18/2022 FINAL  Final       Radiology Studies: DG Tibia/Fibula Left  Result Date: 02/18/2022 CLINICAL DATA:  Ex fix left leg. EXAM: LEFT TIBIA AND FIBULA - 2 VIEW COMPARISON:  None Available. FINDINGS: Reported radiation dose: 1 mGy. Fluoro time: 15 seconds. Three C-arm fluoroscopic images were obtained intraoperatively and submitted for post operative interpretation. These images partially demonstrate plate screw fixation of the proximal tibia. Please see the performing provider's procedural report for further detail. IMPRESSION: Intraoperative fluoroscopy, as above. Electronically Signed   By: Feliberto Harts M.D.   On: 02/18/2022 12:30   DG C-Arm 1-60 Min-No Report  Result Date: 02/18/2022 Fluoroscopy was utilized by the requesting physician.  No radiographic interpretation.      LOS: 7 days    Jacquelin Hawking, MD [facility] 02/20/2022, 12:06 PM   If 7PM-7AM, please contact night-coverage [email or web address]

## 2022-02-21 ENCOUNTER — Other Ambulatory Visit: Payer: Self-pay | Admitting: Physician Assistant

## 2022-02-21 DIAGNOSIS — D649 Anemia, unspecified: Secondary | ICD-10-CM | POA: Diagnosis not present

## 2022-02-21 DIAGNOSIS — M00062 Staphylococcal arthritis, left knee: Secondary | ICD-10-CM | POA: Diagnosis not present

## 2022-02-21 DIAGNOSIS — G894 Chronic pain syndrome: Secondary | ICD-10-CM | POA: Diagnosis not present

## 2022-02-21 DIAGNOSIS — F411 Generalized anxiety disorder: Secondary | ICD-10-CM | POA: Diagnosis not present

## 2022-02-21 MED ORDER — OXYCODONE HCL 10 MG PO TABS: 10.0000 mg | ORAL_TABLET | ORAL | 0 refills | Status: DC | PRN

## 2022-02-21 MED ORDER — ENOXAPARIN SODIUM 40 MG/0.4ML IJ SOSY: 40.0000 mg | PREFILLED_SYRINGE | INTRAMUSCULAR | 0 refills | Status: DC

## 2022-02-21 MED ORDER — ACETAMINOPHEN 500 MG PO TABS: 1000.0000 mg | ORAL_TABLET | Freq: Four times a day (QID) | ORAL | 2 refills | Status: AC | PRN

## 2022-02-21 MED ORDER — RIFAMPIN 300 MG PO CAPS: 300.0000 mg | ORAL_CAPSULE | Freq: Two times a day (BID) | ORAL | 0 refills | Status: DC

## 2022-02-21 MED ORDER — POLYETHYLENE GLYCOL 3350 17 G PO PACK: 17.0000 g | PACK | Freq: Two times a day (BID) | ORAL | 0 refills | Status: DC

## 2022-02-21 MED ORDER — SENNOSIDES-DOCUSATE SODIUM 8.6-50 MG PO TABS: 1.0000 | ORAL_TABLET | Freq: Two times a day (BID) | ORAL | 0 refills | Status: DC

## 2022-02-21 MED ORDER — METHOCARBAMOL 750 MG PO TABS: 750.0000 mg | ORAL_TABLET | Freq: Four times a day (QID) | ORAL | 0 refills | Status: DC | PRN

## 2022-02-21 NOTE — Plan of Care (Signed)
  Problem: Education: Goal: Knowledge of General Education information will improve Description: Including pain rating scale, medication(s)/side effects and non-pharmacologic comfort measures Outcome: Not Progressing   Problem: Health Behavior/Discharge Planning: Goal: Ability to manage health-related needs will improve Outcome: Not Progressing   Problem: Activity: Goal: Risk for activity intolerance will decrease Outcome: Not Progressing   Problem: Coping: Goal: Level of anxiety will decrease Outcome: Not Progressing   Problem: Pain Managment: Goal: General experience of comfort will improve Outcome: Not Progressing   Problem: Clinical Measurements: Goal: Ability to maintain clinical measurements within normal limits will improve Outcome: Not Applicable   Problem: Safety: Goal: Ability to remain free from injury will improve Outcome: Not Applicable   Problem: Skin Integrity: Goal: Risk for impaired skin integrity will decrease Outcome: Not Applicable   Problem: Clinical Measurements: Goal: Will remain free from infection Outcome: Progressing Goal: Diagnostic test results will improve Outcome: Progressing Goal: Respiratory complications will improve Outcome: Progressing   Problem: Nutrition: Goal: Adequate nutrition will be maintained Outcome: Progressing   Problem: Elimination: Goal: Will not experience complications related to bowel motility Outcome: Progressing

## 2022-02-21 NOTE — TOC Progression Note (Signed)
Transition of Care [facility]) - Progression Note    Patient Details  Name: Kerry Young MRN: [id number] Date of Birth: [birth year]  [facility] CM/SW Contact  Bess Kinds, RN Phone Number: [phone] 02/21/2022, 9:14 AM  Clinical Narrative:     Spoke with patient at the bedside. Advised that this RNCM left a voicemail with [facility] Medicaid casemanagement yesterday afternoon and faxed the request for personal care assistance - no response at this time. Encouraged patient to follow up with her insurance company when she transitions home. [facility] is ready to deliver her medications to the home. [facility] will see her Monday morning - patient stated that she feels comfortable administering her medications. Patient stated that her pain is still a problem, but she is managing it. Patient will need stretched transport arranged by [facility] at discharge.   Expected Discharge Plan: Home w Home Health Services Barriers to Discharge: Continued Medical Work up  Expected Discharge Plan and Services Expected Discharge Plan: Home w Home Health Services                                               Social Determinants of Health (SDOH) Interventions    Readmission Risk Interventions    02/21/2021    2:20 PM  Readmission Risk Prevention Plan  Post Dischage Appt Not Complete  Appt Comments DC to SNF  Medication Screening Complete  Transportation Screening Complete

## 2022-02-21 NOTE — Plan of Care (Signed)
°  Problem: Education: °Goal: Knowledge of General Education information will improve °Description: Including pain rating scale, medication(s)/side effects and non-pharmacologic comfort measures °Outcome: Progressing °  °Problem: Elimination: °Goal: Will not experience complications related to bowel motility °Outcome: Progressing °Goal: Will not experience complications related to urinary retention °Outcome: Progressing °  °Problem: Pain Managment: °Goal: General experience of comfort will improve °Outcome: Progressing °  °

## 2022-02-21 NOTE — Progress Notes (Signed)
Patient ID: Kerry Young, female   DOB: 1965-11-15, 56 y.o.   MRN: [id number]  Request for ortho to place d/c pain meds as pt planning on d/c home today per primary team, has refused SNF, and HHPT. D/c meds placed.

## 2022-02-21 NOTE — Discharge Summary (Addendum)
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 *  Hospital Course: Kerry Young is a 56 y.o. female with a history of PTSD, anxiety, chronic pain and tibial plateau fracture s/p ORIF.  Patient presented secondary to pain at her incision site with evidence of septic arthritis secondary to MRSA infection. Patient managed with antibiotics and surgical debridement. PICC line placed for prolonged IV antibiotics.  Assessment and Plan: * 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 Patient managed empirically on Vancomycin and Ceftriaxone. Patient is s/p incision and drainage/ irrigation and debridement of left knee/ closed reduction and splinting. Intraoperative wound cultures significant for MRSA. ID consulted. Patient transitioned to Daptomycin with recommendations for Daptomycin IV x6 weeks. Rifampin added. End date of antibiotics of 04/01/2022. External fixation performed on 8/2. Patient declines SNF and home health PT. Patient discharged on Lovenox for DVT prophylaxis. Recommendation for outpatient orthopedic surgery and infectious disease follow-up.  Normocytic anemia Hemoglobin of 12 on admission with acute drop likely secondary to perioperative blood loss with resultant acute blood loss anemia. Hemoglobin stable.  Chronic pain disorder Patient is on chronic opiates as an  outpatient. Patient transitioned to a PCA pump this admission which was discontinued on 8/1. She was transitioned to dilaudid PO prior to transitioning to oxycodone per orthopedic surgery. Continue Lyrica on discharge.  Obesity (BMI 30-39.9) Body mass index is 39.48 kg/m.  GAD (generalized anxiety disorder) Continue home Xanax and Seroquel.    Consultants: Orthopedic surgery, Infectious disease Procedures performed:  Orthopedic surgery (02/13/2022) Incision and drainage of left septic knee Irrigation and debridement of left postoperative infection Closed reduction and splinting of left knee  Disposition: Home health Diet recommendation: Regular diet  DISCHARGE MEDICATION: Allergies as of 02/21/2022       Reactions   Amitriptyline Other (See Comments)   Cold sores on mouth, blister on tongue. No facial swelling    Celexa [citalopram] Nausea And Vomiting   Took it 2 days. Had n/v and migraine   Naproxen Other (See Comments)   Citalopram Nausea And Vomiting   Naproxen Other (See Comments)   UNK Reaction   Ondansetron Hcl Other (See Comments)   Headache Headache   Other Nausea And Vomiting   ALL ANTIBIOTICS   Zofran [ondansetron] Other (See Comments)        Medication List     STOP taking these medications    Eliquis 2.5 MG Tabs tablet Generic drug: apixaban   HYDROcodone-acetaminophen 5-325 MG tablet Commonly known as: NORCO/VICODIN       TAKE these medications    acetaminophen 500 MG tablet Commonly known as: TYLENOL Take 2 tablets (1,000 mg total) by mouth every 6 (six) hours as needed. What changed:  when to take this reasons to take this   ALPRAZolam 1 MG tablet Commonly known as: Xanax Take 1 tablet (1 mg total) by mouth 3 (three) times daily  as needed for anxiety.   daptomycin  IVPB Commonly known as: CUBICIN Inject 600 mg into the vein daily. Indication: septic knee and hardware complicating bone infection First Dose: Yes Last Day of Therapy:  04/01/22 Labs - Once weekly:  CBC/D, CMP, and CPK Labs - Every other week:  ESR and CRP Method of administration: IV Push Method of administration may be changed at the discretion of home infusion pharmacist based upon assessment of the patient and/or caregiver's ability to self-administer the medication ordered.   diphenhydrAMINE 25 MG tablet Commonly known as: BENADRYL Take 25 mg by mouth 3 (three) times daily as needed for allergies.   docusate sodium 100 MG capsule Commonly known as: COLACE Take 1 capsule (100 mg total) by mouth 2 (two) times daily as needed for mild constipation.   enoxaparin 40 MG/0.4ML injection Commonly known as: LOVENOX Inject 0.4 mLs (40 mg total) into the skin daily. Start taking on: February 22, 2022   methocarbamol 750 MG tablet Commonly known as: ROBAXIN Take 1 tablet (750 mg total) by mouth every 6 (six) hours as needed for muscle spasms. What changed:  medication strength how much to take   Oxycodone HCl 10 MG Tabs Take 1 tablet (10 mg total) by mouth every 4 (four) hours as needed (51m tab q4hrs prn moderate pain, may take additional 2 tabs for breakthrough pain, max 8 tabs in 24hrs). What changed:  how much to take reasons to take this   polyethylene glycol 17 g packet Commonly known as: MIRALAX / GLYCOLAX Take 17 g by mouth 2 (two) times daily.   pregabalin 300 MG capsule Commonly known as: LYRICA Take 300 mg by mouth 2 (two) times daily.   QUEtiapine Fumarate 150 MG 24 hr tablet Commonly known as: SEROQUEL XR Take 1 tablet (150 mg total) by mouth at bedtime.   rifampin 300 MG capsule Commonly known as: RIFADIN Take 1 capsule (300 mg total) by mouth every 12 (twelve) hours.   senna-docusate 8.6-50 MG tablet Commonly known as: Senokot-S Take 1 tablet by mouth 2 (two) times daily.   Vitamin D (Ergocalciferol) 1.25 MG (50000 UNIT) Caps capsule Commonly known as: DRISDOL Take 1 capsule (50,000 Units total) by mouth every 7 (seven)  days.               Discharge Care Instructions  (From admission, onward)           Start     Ordered   02/20/22 0000  Change dressing on IV access line weekly and PRN  (Home infusion instructions - [facility] )        02/20/22 1447            Follow-up Information     Kerry Young, Kerry Young Lot MD. Call in 1 day.   Specialty: Orthopedic Surgery Contact information: [id number] [phone]         Kerry Young Medal Kerry Young Islam MD Follow up on 03/12/2022.   Specialty: Infectious Diseases Why: 11:15AM, For hospital follow-up Contact information: [phone]        PSue Lush PA-C. Schedule an appointment as soon as possible for a visit.   Specialty: Physician Assistant Why: For hospital follow-up Contact information: [id number] [place] [phone]               Discharge Exam: BP 101/72 (BP Location: Right Arm)   Pulse  98   Temp 98 F (36.7 C) (Oral)   Resp 16   Ht '5\' 2"'  (1.575 m)   Wt 95.1 kg   SpO2 93%   BMI 38.35 kg/m   General exam: Appears calm and comfortable  Respiratory system: Respiratory effort normal. Gastrointestinal system: Abdomen is non-distended Central nervous system: Alert and oriented.    Condition at discharge: fair  The results of significant diagnostics from this hospitalization (including imaging, microbiology, ancillary and laboratory) are listed below for reference.   Imaging Studies: DG Tibia/Fibula Left  Result Date: 02/18/2022 CLINICAL DATA:  Ex fix left leg. EXAM: LEFT TIBIA AND FIBULA - 2 VIEW COMPARISON:  None Available. FINDINGS: Reported radiation dose: 1 mGy. Fluoro time: 15 seconds. Three C-arm fluoroscopic images were obtained intraoperatively and submitted for post operative interpretation. These images partially demonstrate plate screw fixation of the proximal tibia. Please see the  performing provider's procedural report for further detail. IMPRESSION: Intraoperative fluoroscopy, as above. Electronically Signed   By: Margaretha Sheffield M.D.   On: 02/18/2022 12:30   DG C-Arm 1-60 Min-No Report  Result Date: 02/18/2022 Fluoroscopy was utilized by the requesting physician.  No radiographic interpretation.   [place] EKG SITE RITE  Result Date: 02/15/2022 If Site Rite image not attached, placement could not be confirmed due to current cardiac rhythm.  DG Knee Left Port  Result Date: 02/13/2022 CLINICAL DATA:  Status post left knee irrigation and debridement. EXAM: PORTABLE LEFT KNEE - 1-2 VIEW COMPARISON:  Left knee radiographs 02/13/2022 at 12:57 a.m. FINDINGS: Redemonstration of proximal lateral tibial plate and screw fixation. Mild-to-moderate displacement of the components of a markedly comminuted proximal left tibial fractures are again seen. There is no overlying splint material. There is a new anterior proximal tibial postsurgical soft tissue drain. IMPRESSION: Interval irrigation and debridement of comminuted proximal tibial fracture status post ORIF. New proximal anterior tibial soft tissue drain. Electronically Signed   By: Yvonne Kendall M.D.   On: 02/13/2022 15:48   DG Knee 1-2 Views Left  Result Date: 02/13/2022 CLINICAL DATA:  Irrigation and debridement of left lower extremity. Intraoperative fluoroscopy. EXAM: LEFT KNEE - 1-2 VIEW COMPARISON:  Left knee radiographs 02/13/2022 FINDINGS: Images were performed intraoperatively without the presence of a radiologist. Redemonstration of proximal lateral tibial plate and screw fixation of mildly to moderately displaced comminuted fractures. Total fluoroscopy images: 4 Total fluoroscopy time: 11 seconds Total dose: Radiation Exposure Index (as provided by the fluoroscopic device): 0.85 mGy air Kerma Please see intraoperative findings for further detail. IMPRESSION: Intraoperative fluoroscopy for left proximal tibial irrigation and  debridement. Electronically Signed   By: Yvonne Kendall M.D.   On: 02/13/2022 14:59   DG C-Arm 1-60 Min-No Report  Result Date: 02/13/2022 Fluoroscopy was utilized by the requesting physician.  No radiographic interpretation.   CT Knee Left Wo Contrast  Result Date: 02/13/2022 CLINICAL DATA:  56 year old female status post ORIF for tibial plateau fracture 3 weeks ago. Wound drainage. EXAM: CT OF THE LEFT KNEE WITHOUT CONTRAST TECHNIQUE: Multidetector CT imaging of the left knee was performed according to the standard protocol. Multiplanar CT image reconstructions were also generated. RADIATION DOSE REDUCTION: This exam was performed according to the departmental dose-optimization program which includes automated exposure control, adjustment of the mA and/or kV according to patient size and/or use of iterative reconstruction technique. COMPARISON:  Left knee series 0057 hours today, and earlier. Preoperative left knee CT 01/27/2022. FINDINGS: Relatively small but somewhat complex appearing knee joint effusion now evident in  the suprapatellar joint space, with some hyperdense material (series 4, image 9). In the distal thigh subcutaneous stranding and inflammation has regressed since the CT on 01/27/2022. Distal femur and patella appear intact. But below the joint space there is increased posterior crescent shaped fluid collection along the superficial fascia (series 4, image 33) up to 14 mm in thickness. This overlies the superficial muscle layer at the level of the proximal gastrocnemius. Furthermore, the belly of the soleus muscle appears expanded and decreased in density on series 4, image 98. No soft tissue gas identified. The other muscle groups appear more stable and within normal limits. Nondisplaced fracture of the proximal fibula appears stable. Proximal tibia ORIF hardware with streak artifact. Comminution and depression of the tibial plateau appears progressed from the preoperative CT. And  displacement of several individual comminution fragments appears increased (such as that along the posterior joint space on series 3, image 34, 17 mm. As seen radiographically, hardware appears intact. IMPRESSION: 1. Depression and displacement of tibial plateau comminution fragments appears progressed since the preoperative CT. Hardware appears intact. Fracture of the proximal fibula remains nondisplaced. 2. Increased swelling and decreased density of the soleus muscle. This might indicate progressive Intramuscular Hematoma and/or Myositis. And a 14 mm low-density fluid collection overlies the proximal gastrocnemius along the fascial layer. 3. Small but complex appearing knee joint effusion. Electronically Signed   By: Genevie Ann M.D.   On: 02/13/2022 04:35   DG Knee Left Port  Result Date: 02/13/2022 CLINICAL DATA:  Query postoperative infection. Left knee pain after surgery 3 weeks ago. EXAM: PORTABLE LEFT KNEE - 1-2 VIEW COMPARISON:  02/04/2022 FINDINGS: Postoperative changes with lateral plate and screw fixation of fractures of the left tibial plateau and metaphysis. Residual impaction of fracture fragments with residual displacement of fracture fragments anteriorly. Anterior displacement of fracture fragments is increased since the prior study. Surgical hardware appears intact. A moderate effusion is present. No radiopaque soft tissue foreign bodies or soft tissue gas. IMPRESSION: Postoperative changes with internal fixation of fractures of the left tibial plateau and metaphysis. Displaced fracture fragments anteriorly demonstrate increased displacement since previous study. Moderate left knee effusion. Electronically Signed   By: Lucienne Capers M.D.   On: 02/13/2022 01:34   DG Foot Complete Left  Result Date: 02/04/2022 CLINICAL DATA:  Left leg pain, post open fracture fixation EXAM: LEFT FOOT - COMPLETE 3+ VIEW COMPARISON:  None Available. FINDINGS: There is no evidence of fracture or dislocation.  There is no evidence of arthropathy or other focal bone abnormality. Diffuse soft tissue swelling. IMPRESSION: 1. No acute fracture or dislocation identified about the left foot. 2. Diffuse soft tissue swelling. Electronically Signed   By: Kerry Young M.D.   On: 02/04/2022 17:48   DG Ankle Complete Left  Result Date: 02/04/2022 CLINICAL DATA:  Status post fall. EXAM: LEFT ANKLE COMPLETE - 3+ VIEW COMPARISON:  None Available. FINDINGS: There is no evidence of fracture, dislocation, or joint effusion. There is no evidence of arthropathy or other focal bone abnormality. Diffuse soft tissue swelling. IMPRESSION: 1. No acute fracture or dislocation identified about the left ankle. 2. Diffuse soft tissue swelling. Electronically Signed   By: Kerry Young M.D.   On: 02/04/2022 17:46   CT HEAD WO CONTRAST (5MM)  Result Date: 02/04/2022 CLINICAL DATA:  Recent trauma EXAM: CT HEAD WITHOUT CONTRAST TECHNIQUE: Contiguous axial images were obtained from the base of the skull through the vertex without intravenous contrast. RADIATION DOSE REDUCTION: This exam was  performed according to the departmental dose-optimization program which includes automated exposure control, adjustment of the mA and/or kV according to patient size and/or use of iterative reconstruction technique. COMPARISON:  06/29/2021 FINDINGS: Brain: No evidence of acute infarction, hemorrhage, cerebral edema, mass, mass effect, or midline shift. No hydrocephalus or extra-axial fluid collection. Vascular: No hyperdense vessel. Skull: Normal. Negative for fracture or focal lesion. Sinuses/Orbits: No acute finding. Other: The mastoid air cells are well aerated. IMPRESSION: No acute intracranial process. Electronically Signed   By: Kerry Young M.D.   On: 02/04/2022 16:53   DG Knee 2 Views Left  Result Date: 02/04/2022 CLINICAL DATA:  Fall with open tibia/fibula fracture. Stitches came out 2 days ago. EXAM: LEFT KNEE - 1-2 VIEW COMPARISON:   Knee radiographs 01/28/2022. FINDINGS: Postsurgical changes reflecting sideplate and screw fixation of the comminuted proximal tibial fracture are again seen. Hardware alignment is stable and within expected limits, without evidence of hardware related complication. The fracture alignment appears unchanged, allowing for slight differences in obliquity, with unchanged slight medial location of the dominant proximal tibial fracture fragment. No new fracture is seen. Postoperative soft tissue gas about the knee has resolved. IMPRESSION: Postsurgical changes reflecting sideplate and screw fixation of the comminuted proximal tibial fracture without evidence of hardware related complication or new acute injury. Fracture alignment is unchanged allowing for slight differences in patient obliquity. Decreased surrounding postoperative soft tissue gas since the prior study. Electronically Signed   By: Valetta Mole M.D.   On: 02/04/2022 16:38   DG Knee Left Port  Result Date: 01/28/2022 CLINICAL DATA:  96051 ORIF for fracture of the proximal tibia at the tibial plateau follow-up study EXAM: PORTABLE LEFT KNEE - 3 VIEW COMPARISON:  Intraoperative x-ray performed on the same day earlier at 11:47 a.m. FINDINGS: Three views of the left knee were obtained. Again seen are the lateral tibial plate and screw devices transfixing the comminuted fractured proximal tibia affecting the medial and lateral tibial plateau extending through the tibial spines into the articular surface. There is minimal medial displacement of the fractured medial tibial plateau seen. Soft tissue emphysema and mild soft tissue swelling. IMPRESSION: ORIF changes with lateral tibial plate and screw devices transfix the comminuted fracture of the proximal tibia. There is minimal medial displacement of the fractured medial tibial plateau seen. Electronically Signed   By: Frazier Richards M.D.   On: 01/28/2022 14:40   DG Knee 1-2 Views Left  Result Date:  01/28/2022 CLINICAL DATA:  Open reduction internal fixation of left tibial plateau. EXAM: LEFT KNEE - 1-2 VIEW COMPARISON:  Left knee radiographs 01/27/2022 FINDINGS: Images were performed intraoperatively without the presence of a radiologist. Redemonstration of comminuted fracture of the proximal medial and lateral tibia extending through the tibial spines and lateral tibial plateau medial and lateral aspect of the proximal tibial metaphysis. New lateral plate and screw fixation with improved alignment. Total fluoroscopy images: 4 Total fluoroscopy time: 142 seconds Total dose: Radiation Exposure Index (as provided by the fluoroscopic device): 4.49 mGy air Kerma Please see intraoperative findings for further detail. IMPRESSION: Intraoperative fluoroscopy for left proximal tibial ORIF. Electronically Signed   By: Yvonne Kendall M.D.   On: 01/28/2022 12:08   DG C-Arm 1-60 Min-No Report  Result Date: 01/28/2022 Fluoroscopy was utilized by the requesting physician.  No radiographic interpretation.   CT Knee Left Wo Contrast  Result Date: 01/28/2022 CLINICAL DATA:  Left knee fracture. EXAM: CT OF THE LEFT KNEE WITHOUT CONTRAST TECHNIQUE: Multidetector CT imaging  of the left knee was performed according to the standard protocol. Multiplanar CT image reconstructions were also generated. RADIATION DOSE REDUCTION: This exam was performed according to the departmental dose-optimization program which includes automated exposure control, adjustment of the mA and/or kV according to patient size and/or use of iterative reconstruction technique. COMPARISON:  Post closed reduction left knee AP and lateral series earlier today. FINDINGS: Bones/Joint/Cartilage There is mild osteopenia. There is acute comminuted fracture of the proximal tibia. This consists of an oblique fracture extending from the medial aspect of the proximal metaphysis up of this fracture as well through the lateral edge of the lateral tibial spine with  slight distraction at the fracture site, and with small comminution fragments of the anterior and posterior margins of this fracture as well as of the lateral tibial spine; and a multifragmented comminuted fracture of the lateral tibial plateau extending down through the lateral tibial metaphysis. Some of the more anterior fragments of the lateral plateau are depressed up to 10 mm from the plane of the articular surface. Others are depressed no more than about 3 mm. There is a slight spreading of the multiple fragments. There are mild features of femorotibial arthrosis slight joint space loss medially. The patellofemoral joint is intact. There is no further evidence fractures. There is a small suprapatellar lipohemarthrosis. No primary bone lesion is seen. Ligaments Suboptimally assessed by CT. Muscles and Tendons There is normal muscle bulk in the distal thigh and proximal foreleg. No intramuscular hematoma is seen. Area tendons are not well evaluated but grossly intact. Soft tissues Small superficial hematoma noted posteriorly at the level of the joint line on 4:50 measuring 4.5 x 1.2 cm. There is surrounding edema in the posterior soft tissues. There is additional edema in the anterior proximal foreleg. There is mild intermuscular edema posterior distal thigh compartment. No deep soft fluid collections. IMPRESSION: 1. Comminuted proximal tibial fracture with the greatest comminution involving the lateral tibial plateau, the latter with depressed fragments up to 10 mm from the plane of the articular surface and some spreading of the fragments. 2. Small lipohemarthrosis. 3. Mild features of arthrosis. 4. Intermuscular edema in the posterior compartment of the distal thigh. 5. Small subcutaneous hematoma posteriorly at the level of the joint. Electronically Signed   By: Kerry Young M.D.   On: 01/28/2022 00:31   DG Knee 1-2 Views Left  Result Date: 01/27/2022 CLINICAL DATA:  Postreduction. EXAM: LEFT KNEE -  1-2 VIEW COMPARISON:  Left tibia and fibula x-ray 01/27/2022 FINDINGS: There is an acute comminuted fracture of the proximal tibia involving the tibial spines and lateral tibial plateau. Alignment is now near anatomic. There is mild depression of the lateral tibial plateau proximally 6 mm. Joint spaces are maintained. Joint effusion is present. There is soft tissue swelling surrounding the fracture. IMPRESSION: 1. Comminuted proximal tibial and tibial plateau fracture is now in near anatomic alignment. 2. Joint effusion. Electronically Signed   By: Kerry Asters M.D.   On: 01/27/2022 23:04   DG Femur Min 2 Views Left  Result Date: 01/27/2022 CLINICAL DATA:  Fall EXAM: LEFT FEMUR 2 VIEWS COMPARISON:  None Available. FINDINGS: Acute comminuted and displaced intra-articular fracture involving the proximal tibia. Lipohemarthrosis. Possible nondisplaced fracture involving the fibular head neck. IMPRESSION: 1. Acute comminuted displaced and impacted intra-articular fracture involving proximal tibia with lipohemarthrosis 2. Possible nondisplaced fracture involving the proximal fibula Electronically Signed   By: Donavan Foil M.D.   On: 01/27/2022 21:33   DG Tibia/Fibula  Left  Result Date: 01/27/2022 CLINICAL DATA:  Fall with pain EXAM: LEFT TIBIA AND FIBULA - 2 VIEW COMPARISON:  None Available. FINDINGS: Acute comminuted intra-articular proximal tibial fracture with impaction and about 2/3 shaft diameter posterior displacement of the distal tibia. Fibular head is not well evaluated. IMPRESSION: Acute comminuted, impacted and displaced intra-articular fracture involving the proximal tibia Electronically Signed   By: Donavan Foil M.D.   On: 01/27/2022 21:31   DG Pelvis 1-2 Views  Result Date: 01/27/2022 CLINICAL DATA:  Fall, unable to move the leg EXAM: PELVIS - 1-2 VIEW COMPARISON:  CT 02/07/2018 FINDINGS: SI joints are non widened. Pubic symphysis and rami appear intact. No fracture or malalignment.  IMPRESSION: Negative. Electronically Signed   By: Donavan Foil M.D.   On: 01/27/2022 21:29    Microbiology: Results for orders placed or performed during the hospital encounter of 02/12/22  Aerobic/Anaerobic Culture w Gram Stain (surgical/deep wound)     Status: None   Collection Time: 02/13/22  2:02 PM   Specimen: PATH Soft tissue  Result Value Ref Range Status   Specimen Description WOUND  Final   Special Requests LEFT KNEE INFECTION  Final   Gram Stain   Final    ABUNDANT WBC PRESENT, PREDOMINANTLY PMN FEW GRAM POSITIVE COCCI    Culture   Final    MODERATE METHICILLIN RESISTANT STAPHYLOCOCCUS AUREUS NO ANAEROBES ISOLATED Performed at [facility], [place] [address] [place], [place] [address]    Report Status 02/18/2022 FINAL  Final   Organism ID, Bacteria METHICILLIN RESISTANT STAPHYLOCOCCUS AUREUS  Final      Susceptibility   Methicillin resistant staphylococcus aureus - MIC*    CIPROFLOXACIN <=0.5 SENSITIVE Sensitive     ERYTHROMYCIN <=0.25 SENSITIVE Sensitive     GENTAMICIN <=0.5 SENSITIVE Sensitive     OXACILLIN RESISTANT Resistant     TETRACYCLINE <=1 SENSITIVE Sensitive     VANCOMYCIN <=0.5 SENSITIVE Sensitive     TRIMETH/SULFA <=10 SENSITIVE Sensitive     CLINDAMYCIN <=0.25 SENSITIVE Sensitive     RIFAMPIN <=0.5 SENSITIVE Sensitive     Inducible Clindamycin NEGATIVE Sensitive     * MODERATE METHICILLIN RESISTANT STAPHYLOCOCCUS AUREUS  Aerobic/Anaerobic Culture w Gram Stain (surgical/deep wound)     Status: None   Collection Time: 02/13/22  2:03 PM   Specimen: PATH Soft tissue  Result Value Ref Range Status   Specimen Description WOUND  Final   Special Requests LEFT KNEE INFECTION  Final   Gram Stain   Final    ABUNDANT WBC PRESENT, PREDOMINANTLY PMN RARE GRAM POSITIVE COCCI IN PAIRS    Culture   Final    MODERATE STAPHYLOCOCCUS AUREUS SUSCEPTIBILITIES PERFORMED ON PREVIOUS CULTURE WITHIN THE LAST 5 DAYS. NO ANAEROBES ISOLATED Performed at [facility], [place] [address] [place], [place] [address]    Report Status 02/18/2022 FINAL  Final    Labs: CBC: Recent Labs  Lab 02/15/22 0207 02/16/22 0318 02/18/22 0325  WBC 10.0 10.0 10.7*  HGB 10.4* 9.7* 9.8*  HCT 32.4* 31.1* 30.6*  MCV 91.3 93.4 91.3  PLT 420* 456* 732*   Basic Metabolic Panel: Recent Labs  Lab 02/15/22 0207 02/16/22 0318 02/18/22 0325  NA 137 136 137  K 3.4* 3.7 3.7  CL 108 101 102  CO2 19* 24 25  GLUCOSE 111* 107* 113*  BUN '12 13 11  ' CREATININE 0.63 0.72 0.57  CALCIUM 8.6* 9.0 8.7*   Liver Function Tests: Recent Labs  Lab 02/20/22 0450  AST 90*  ALT 97*  ALKPHOS 179*  BILITOT 0.3  PROT 6.5  ALBUMIN 2.4*   CBG: No results for input(s): "GLUCAP" in the last 168 hours.  Discharge time spent: 35 minutes.  Signed: Cordelia Poche, MD [facility] 02/21/2022

## 2022-02-21 NOTE — Plan of Care (Signed)

## 2022-02-21 NOTE — Progress Notes (Signed)
Occupational Therapy Treatment Patient Details Name: Kerry Young MRN: [id number] DOB: [birth year] Today's Date: 02/21/2022   History of present illness Pt is a 56 yr old old female re-admitted after fall from w[facility]. Found to have septic L knee and postop infection s/p I&D 7/28. External fixation 8/2. Pt previously admitted after a fall 7/12 with L bicondylar plateau fx s/p ORIF. Refused SNF rehab and d/c'd to mothers home. PMH: UTI, anxiety, PONV, sciatica, PTSD, ORIF L ulnar fx, R IM nail of tibial, ORIF of R humerus. External fixator and I & D of left knee 08/02.   OT comments  Patient found in bed, had just removed foot plate and complaining of pain.  RN in room to address.  No red areas, or any other areas of concern.  Splint care/wear sheet left in room, reviewed, and patient acknowledged understanding.  Patient saying she is leaving, but no discharge order in plan.  OT can continue efforts, and SNF has been recommended given lack of mobility.      Recommendations for follow up therapy are one component of a multi-disciplinary discharge planning process, led by the attending physician.  Recommendations may be updated based on patient status, additional functional criteria and insurance authorization.    Follow Up Recommendations  Skilled nursing-short term rehab (<3 hours/day)    Assistance Recommended at Discharge Frequent or constant Supervision/Assistance  Patient can return home with the following  A lot of help with walking and/or transfers;A lot of help with bathing/dressing/bathroom;Assist for transportation;Direct supervision/assist for financial management;Direct supervision/assist for medications management;Assistance with cooking/housework   Equipment Recommendations  Hospital bed    Recommendations for Other Services      Precautions / Restrictions Precautions Precautions: Fall Precaution Comments: External fixation; contact precautions Required Braces or Orthoses: Other  Brace Splint/Cast - Date Prophylactic Dressing Applied (if applicable): 02/20/22 Restrictions Weight Bearing Restrictions: Yes LUE Weight Bearing: Non weight bearing LLE Weight Bearing: Non weight bearing                                                                                                                   Pertinent Vitals/ Pain       Pain Assessment Faces Pain Scale: Hurts whole lot Pain Location: LLE Pain Descriptors / Indicators: Grimacing, Moaning Pain Intervention(s): Patient requesting pain meds-RN notified, RN gave pain meds during session                                                          Frequency  Min 2X/week        Progress Toward Goals  OT Goals(current goals can now be found in the care plan section)  Progress towards OT goals: Progressing toward goals  Acute Rehab OT Goals OT Goal Formulation: With patient Time For Goal Achievement: 03/02/22 Potential to Achieve Goals: Fair  Plan Discharge plan remains appropriate;Frequency remains appropriate    Co-evaluation                 AM-PAC OT "6 Clicks" Daily Activity     Outcome Measure   Help from another person eating meals?: None Help from another person taking care of personal grooming?: None Help from another person toileting, which includes using toliet, bedpan, or urinal?: A Lot Help from another person bathing (including washing, rinsing, drying)?: A Lot Help from another person to put on and taking off regular upper body clothing?: A Little Help from another person to put on and taking off regular lower body clothing?: A Lot 6 Click Score: 17    End of Session    OT Visit Diagnosis: Unsteadiness on feet (R26.81);Other abnormalities of gait and mobility (R26.89);Repeated falls (R29.6);Muscle weakness (generalized) (M62.81);History of falling (Z91.81);Pain Pain - Right/Left: Left Pain - part of  body: Leg   Activity Tolerance Patient tolerated treatment well   Patient Left in bed;with call bell/phone within reach   Nurse Communication Other (comment)        Time: 8295-6213 OT Time Calculation (min): 11 min  Charges: OT General Charges $OT Visit: 1 Visit OT Treatments $Self Care/Home Management : 8-22 mins  02/21/2022  RP, OTR/L  Acute Rehabilitation Services  Office:  [phone]   Suzanna Obey 02/21/2022, 9:44 AM

## 2022-02-21 NOTE — Discharge Instructions (Addendum)
Kerry Young,  You were in the hospital with an infected knee. This was treated with antibiotics and cleaning out the joint with surgery. Please continue your antibiotics as prescribed and follow-up with the orthopedic surgeon and infectious disease physician.    Orthopaedic Trauma Service Discharge Instructions   General Discharge Instructions  WEIGHT BEARING STATUS:Non-weightbearing left leg  RANGE OF MOTION/ACTIVITY: Do not remove external fixator from your leg. Work on ankle range of motion daily  Wound Care: Incisions can be left open to air if there is no drainage. If incision continues to have drainage, follow wound care instructions below. You may shower with your ex-fix in place if there is no drainage from your incision  DVT/PE prophylaxis: Lovenox  Diet: as you were eating previously.  Can use over the counter stool softeners and bowel preparations, such as Miralax, to help with bowel movements.  Narcotics can be constipating.  Be sure to drink plenty of fluids  PAIN MEDICATION USE AND EXPECTATIONS  You have likely been given narcotic medications to help control your pain.  After a traumatic event that results in an fracture (broken bone) with or without surgery, it is ok to use narcotic pain medications to help control one's pain.  We understand that everyone responds to pain differently and each individual patient will be evaluated on a regular basis for the continued need for narcotic medications. Ideally, narcotic medication use should last no more than 6-8 weeks (coinciding with fracture healing).   As a patient it is your responsibility as well to monitor narcotic medication use and report the amount and frequency you use these medications when you come to your office visit.   We would also advise that if you are using narcotic medications, you should take a dose prior to therapy to maximize you participation.  IF YOU ARE ON NARCOTIC MEDICATIONS IT IS NOT PERMISSIBLE TO  OPERATE A MOTOR VEHICLE (MOTORCYCLE/CAR/TRUCK/MOPED) OR HEAVY MACHINERY DO NOT MIX NARCOTICS WITH OTHER CNS (CENTRAL NERVOUS SYSTEM) DEPRESSANTS SUCH AS ALCOHOL   STOP SMOKING OR USING NICOTINE PRODUCTS!!!!  As discussed nicotine severely impairs your body's ability to heal surgical and traumatic wounds but also impairs bone healing.  Wounds and bone heal by forming microscopic blood vessels (angiogenesis) and nicotine is a vasoconstrictor (essentially, shrinks blood vessels).  Therefore, if vasoconstriction occurs to these microscopic blood vessels they essentially disappear and are unable to deliver necessary nutrients to the healing tissue.  This is one modifiable factor that you can do to dramatically increase your chances of healing your injury.    (This means no smoking, no nicotine gum, patches, etc)  DO NOT USE NONSTEROIDAL ANTI-INFLAMMATORY DRUGS (NSAID'S)  Using products such as Advil (ibuprofen), Aleve (naproxen), Motrin (ibuprofen) for additional pain control during fracture healing can delay and/or prevent the healing response.  If you would like to take over the counter (OTC) medication, Tylenol (acetaminophen) is ok.  However, some narcotic medications that are given for pain control contain acetaminophen as well. Therefore, you should not exceed more than 4000 mg of tylenol in a day if you do not have liver disease.  Also note that there are may OTC medicines, such as cold medicines and allergy medicines that my contain tylenol as well.  If you have any questions about medications and/or interactions please ask your doctor/PA or your pharmacist.      ICE AND ELEVATE INJURED/OPERATIVE EXTREMITY  Using ice and elevating the injured extremity above your heart can help with swelling and  pain control.  Icing in a pulsatile fashion, such as 20 minutes on and 20 minutes off, can be followed.    Do not place ice directly on skin. Make sure there is a barrier between to skin and the ice pack.     Using frozen items such as frozen peas works well as the conform nicely to the are that needs to be iced.  USE AN ACE WRAP OR TED HOSE FOR SWELLING CONTROL  In addition to icing and elevation, Ace wraps or TED hose are used to help limit and resolve swelling.  It is recommended to use Ace wraps or TED hose until you are informed to stop.    When using Ace Wraps start the wrapping distally (farthest away from the body) and wrap proximally (closer to the body)   Example: If you had surgery on your leg or thing and you do not have a splint on, start the ace wrap at the toes and work your way up to the thigh        If you had surgery on your upper extremity and do not have a splint on, start the ace wrap at your fingers and work your way up to the upper arm   CALL THE OFFICE WITH ANY QUESTIONS OR CONCERNS: [phone]   VISIT OUR WEBSITE FOR ADDITIONAL INFORMATION: [email or web address]      Discharge Pin Site Instructions  Dress pins daily with Kerlix roll starting on POD 2. Wrap the Kerlix so that it tamps the skin down around the pin-skin interface to prevent/limit motion of the skin relative to the pin.  (Pin-skin motion is the primary cause of pain and infection related to external fixator pin sites).  Remove any crust or coagulum that may obstruct drainage with a saline moistened gauze or soap and water.  After POD 3, if there is no discernable drainage on the pin site dressing, the interval for change can by increased to every other day.  You may shower with the fixator, cleaning all pin sites gently with soap and water.  If you have a surgical wound this needs to be completely dry and without drainage before showering.  The extremity can be lifted by the fixator to facilitate wound care and transfers.  Notify the office/Doctor if you experience increasing drainage, redness, or pain from a pin site, or if you notice purulent (thick, snot-like) drainage.  Discharge Wound Care  Instructions  Do NOT apply any ointments, solutions or lotions to pin sites or surgical wounds.  These prevent needed drainage and even though solutions like hydrogen peroxide kill bacteria, they also damage cells lining the pin sites that help fight infection.  Applying lotions or ointments can keep the wounds moist and can cause them to breakdown and open up as well. This can increase the risk for infection. When in doubt call the office.  Surgical incisions should be dressed daily.  If any drainage is noted, use one layer of adaptic or Mepitel, then gauze, Kerlix, and an ace wrap. - These dressing supplies should be available at local medical supply stores [facility], [facility], etc) as well as Insurance claims handler ([employer], [facility], [place], etc)  Once the incision is completely dry and without drainage, it may be left open to air out.  Showering may begin 36-48 hours later.  Cleaning gently with soap and water.  Traumatic wounds should be dressed daily as well.    One layer of adaptic, gauze, Kerlix, then ace wrap.  The  adaptic can be discontinued once the draining has ceased    If you have a wet to dry dressing: wet the gauze with saline the squeeze as much saline out so the gauze is moist (not soaking wet), place moistened gauze over wound, then place a dry gauze over the moist one, followed by Kerlix wrap, then ace wrap.

## 2022-02-22 DIAGNOSIS — G894 Chronic pain syndrome: Secondary | ICD-10-CM | POA: Diagnosis not present

## 2022-02-22 DIAGNOSIS — D649 Anemia, unspecified: Secondary | ICD-10-CM | POA: Diagnosis not present

## 2022-02-22 DIAGNOSIS — M00062 Staphylococcal arthritis, left knee: Secondary | ICD-10-CM | POA: Diagnosis not present

## 2022-02-22 DIAGNOSIS — F411 Generalized anxiety disorder: Secondary | ICD-10-CM | POA: Diagnosis not present

## 2022-02-22 NOTE — TOC Progression Note (Signed)
Transition of Care [facility]) - Progression Note    Patient Details  Name: Kerry Young MRN: [id number] Date of Birth: 1965/12/28  Transition of [facility]) CM/SW Contact  Bess Kinds, RN Phone Number: [phone] 02/22/2022, 8:25 AM  Clinical Narrative:     Spoke with patient at the bedside this morning to address transportation home. Patient with c/o severe pain and only wanted her bedside nurse to address her pain. Spoke with patient's SO, Kerry Young, to discuss transition home. He is unsure if he can provide level of care that patient needs. He will come to see patient at the hospital.   Expected Discharge Plan: Home w Home Health Services Barriers to Discharge: Continued Medical Work up  Expected Discharge Plan and Services Expected Discharge Plan: Home w Home Health Services         Expected Discharge Date: 02/21/22                                     Social Determinants of Health (SDOH) Interventions    Readmission Risk Interventions    02/21/2021    2:20 PM  Readmission Risk Prevention Plan  Post Dischage Appt Not Complete  Appt Comments DC to SNF  Medication Screening Complete  Transportation Screening Complete

## 2022-02-22 NOTE — TOC Progression Note (Addendum)
Transition of Care [facility]) - Progression Note    Patient Details  Name: Kerry Young MRN: [id number] Date of Birth: [birth year]  [facility] CM/SW Contact  Bess Kinds, RN Phone Number: [phone] 02/22/2022, 12:26 PM  Clinical Narrative:     Spoke with patient at the bedside and called Kerry Young and her mother to discuss transition home. Her mother is not comfortable with patient returning to her home. Her mother has her own healthcare concerns and is not able to provide the physical assistance that patient requires at this time. Kerry Young expresses his concerns about being able to provide care. Kerry Young to come to the hospital to see patient today, and requests to meet with Kerry Young to discuss IV home infusion and to work with PT tomorrow. Discussed with Kerry Young additional education needs, and she will reach out to [facility]. Offered SNF but patient had such a bad experience last year that she cannot even consider going back to a facility. TOC following for transition needs.   1600: Spoke with patient and SO, Kerry Young, at the bedside. Reviewed long term antibiotics and NWB on L leg. Liaison at [facility] to meet with Kerry Young in the AM. Kerry Young requesting to meet with PT in the AM to discuss techniques for assisting patient.   Expected Discharge Plan: Home w Home Health Services Barriers to Discharge: Continued Medical Work up  Expected Discharge Plan and Services Expected Discharge Plan: Home w Home Health Services         Expected Discharge Date: 02/21/22                                     Social Determinants of Health (SDOH) Interventions    Readmission Risk Interventions    02/21/2021    2:20 PM  Readmission Risk Prevention Plan  Post Dischage Appt Not Complete  Appt Comments DC to SNF  Medication Screening Complete  Transportation Screening Complete

## 2022-02-22 NOTE — Progress Notes (Signed)
PROGRESS NOTE    Kerry Young  FFM:[id number] DOB: 01/20/1966 DOA: 02/12/2022 PCP: Nathaneil Canary, PA-C   Brief Narrative: Kerry Young is a 56 y.o. female with a history of PTSD, anxiety, chronic pain and tibial plateau fracture s/p ORIF.  Patient presented secondary to pain at her incision site with evidence of septic arthritis secondary to MRSA infection. Patient managed with antibiotics and surgical debridement. PICC line placed for prolonged IV antibiotics.   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 Patient is s/p incision and drainage/ irrigation and debridement of left knee/ closed reduction and splinting. Intraoperative wound cultures significant for MRSA. ID consulted. Patient transitioned to Daptomycin with recommendations for Daptomycin IV x6 weeks. Rifampin added. End date of antibiotics of 04/01/2022. External fixation performed on 8/2. Patient declines SNF and home health PT. Patient discharged on Lovenox for DVT prophylaxis. Recommendation for outpatient orthopedic surgery and infectious disease follow-up.  Normocytic anemia Hemoglobin of 12 on admission with acute drop likely secondary to perioperative blood loss with resultant acute blood loss anemia. Hemoglobin stable.  Chronic pain disorder Patient is on chronic opiates as an outpatient. Patient transitioned to a PCA pump this admission which was discontinued on 8/1. She was transitioned to dilaudid PO prior to transitioning to oxycodone per orthopedic surgery. Continue Lyrica on discharge.  Obesity (BMI 30-39.9) Body mass index is 39.48 kg/m.  GAD (generalized anxiety disorder) Continue home Xanax and Seroquel.    DVT prophylaxis: Lovenox Code Status:   Code Status: Full Code Family Communication: None at bedside Disposition Plan: Discharged home but now awaiting family/friends ability to receive  patient  Consultants:  Orthopedic surgery Infectious disease  Procedures:  Orthopedic surgery (02/13/2022) Incision and drainage of left septic knee Irrigation and debridement of left postoperative infection Closed reduction and splinting of left knee   Antimicrobials: Vancomycin Ceftriaxone Daptomycin    Subjective: Still with leg pain. Concerned that family may not take care of her on discharge. Continues to decline SNF. States if no one will take care of her, she wants to go home on her own.  Objective: BP 104/70 (BP Location: Right Arm)   Pulse 84   Temp 98.8 F (37.1 C) (Oral)   Resp 16   Ht 5\' 2"  (1.575 m)   Wt 96.2 kg   SpO2 97%   BMI 38.79 kg/m   Examination:  General exam: Appears calm and comfortable Central nervous system: Alert and oriented. No focal neurological deficits. Psychiatry: Judgement and insight appear normal. Mood & affect appropriate.    Data Reviewed: I have personally reviewed following labs and imaging studies  CBC Lab Results  Component Value Date   WBC 10.7 (H) 02/18/2022   RBC 3.35 (L) 02/18/2022   HGB 9.8 (L) 02/18/2022   HCT 30.6 (L) 02/18/2022   MCV 91.3 02/18/2022   MCH 29.3 02/18/2022   PLT 487 (H) 02/18/2022   MCHC 32.0 02/18/2022   RDW 14.9 02/18/2022   LYMPHSABS 1.6 02/13/2022   MONOABS 1.2 (H) 02/13/2022   EOSABS 0.1 02/13/2022   BASOSABS 0.0 02/13/2022     Last metabolic panel Lab Results  Component Value Date   NA 137 02/18/2022   K 3.7 02/18/2022   CL 102 02/18/2022   CO2 25 02/18/2022   BUN 11 02/18/2022   CREATININE 0.57 02/18/2022   GLUCOSE 113 (H) 02/18/2022   GFRNONAA >60 02/18/2022   GFRAA >60  07/01/2019   CALCIUM 8.7 (L) 02/18/2022   PROT 6.5 02/20/2022   ALBUMIN 2.4 (L) 02/20/2022   BILITOT 0.3 02/20/2022   ALKPHOS 179 (H) 02/20/2022   AST 90 (H) 02/20/2022   ALT 97 (H) 02/20/2022   ANIONGAP 10 02/18/2022    GFR: Estimated Creatinine Clearance: 85.9 mL/min (by C-G formula based on  SCr of 0.57 mg/dL).  Recent Results (from the past 240 hour(s))  Aerobic/Anaerobic Culture w Gram Stain (surgical/deep wound)     Status: None   Collection Time: 02/13/22  2:02 PM   Specimen: PATH Soft tissue  Result Value Ref Range Status   Specimen Description WOUND  Final   Special Requests LEFT KNEE INFECTION  Final   Gram Stain   Final    ABUNDANT WBC PRESENT, PREDOMINANTLY PMN FEW GRAM POSITIVE COCCI    Culture   Final    MODERATE METHICILLIN RESISTANT STAPHYLOCOCCUS AUREUS NO ANAEROBES ISOLATED Performed at [facility], [address], [place], [place] [address]    Report Status 02/18/2022 FINAL  Final   Organism ID, Bacteria METHICILLIN RESISTANT STAPHYLOCOCCUS AUREUS  Final      Susceptibility   Methicillin resistant staphylococcus aureus - MIC*    CIPROFLOXACIN <=0.5 SENSITIVE Sensitive     ERYTHROMYCIN <=0.25 SENSITIVE Sensitive     GENTAMICIN <=0.5 SENSITIVE Sensitive     OXACILLIN RESISTANT Resistant     TETRACYCLINE <=1 SENSITIVE Sensitive     VANCOMYCIN <=0.5 SENSITIVE Sensitive     TRIMETH/SULFA <=10 SENSITIVE Sensitive     CLINDAMYCIN <=0.25 SENSITIVE Sensitive     RIFAMPIN <=0.5 SENSITIVE Sensitive     Inducible Clindamycin NEGATIVE Sensitive     * MODERATE METHICILLIN RESISTANT STAPHYLOCOCCUS AUREUS  Aerobic/Anaerobic Culture w Gram Stain (surgical/deep wound)     Status: None   Collection Time: 02/13/22  2:03 PM   Specimen: PATH Soft tissue  Result Value Ref Range Status   Specimen Description WOUND  Final   Special Requests LEFT KNEE INFECTION  Final   Gram Stain   Final    ABUNDANT WBC PRESENT, PREDOMINANTLY PMN RARE GRAM POSITIVE COCCI IN PAIRS    Culture   Final    MODERATE STAPHYLOCOCCUS AUREUS SUSCEPTIBILITIES PERFORMED ON PREVIOUS CULTURE WITHIN THE LAST 5 DAYS. NO ANAEROBES ISOLATED Performed at [facility], [address] [place], [place] [address]    Report Status 02/18/2022 FINAL  Final      Radiology Studies: No  results found.    LOS: 9 days    Jacquelin Hawking, MD [facility] 02/22/2022, 11:06 AM   If 7PM-7AM, please contact night-coverage [email or web address]

## 2022-02-22 NOTE — NC FL2 (Signed)
[place] MEDICAID FL2 LEVEL OF CARE SCREENING TOOL     IDENTIFICATION  Patient Name: Kerry Young Birthdate: February 24, 1966 Sex: female Admission Date (Current Location): 02/12/2022  [facility] and [place] Number:  Producer, television/film/video and Address:  [facility] [place], 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 02/13/2022   Closed fracture of left tibial plateau 01/27/2022   PTSD (post-traumatic stress disorder) 03/17/2021   Proximal humerus fracture 02/04/2021   GAD (generalized anxiety disorder) 07/08/2020   Mild depression 07/08/2020   Insomnia 07/08/2020    Orientation RESPIRATION BLADDER Height & Weight     Self, Time, Situation, Place  Normal Continent Weight: 96.2 kg Height:  5\' 2"  (157.5 cm)  BEHAVIORAL SYMPTOMS/MOOD NEUROLOGICAL BOWEL NUTRITION STATUS      Continent Diet  AMBULATORY STATUS COMMUNICATION OF NEEDS Skin   Extensive Assist Verbally Surgical wounds                       Personal Care Assistance Level of Assistance  Bathing, Feeding, Dressing Bathing Assistance: Maximum assistance Feeding assistance: Limited assistance Dressing Assistance: Maximum assistance     Functional Limitations Info  Sight, Hearing, Speech Sight Info: Adequate Hearing Info: Adequate Speech Info: Adequate    SPECIAL CARE FACTORS FREQUENCY  PT (By licensed PT), OT (By licensed OT)     PT Frequency: PT at  SNF to eval and treat a min of 5x/week OT Frequency: OT at SNF to eval and treat a min of 5x/week            Contractures Contractures Info: Not present    Additional Factors Info  Code Status, Allergies Code Status Info: Full Allergies Info: amitriptyline, celexa, naproxen, citalopran, ondansetran           Current Medications (02/22/2022):  This is the current hospital active medication list Current Facility-Administered Medications  Medication Dose Route Frequency Provider Last Rate Last Admin   acetaminophen (TYLENOL) tablet 650 mg  650 mg Oral Q6H Haddix, 04/24/2022, MD   650 mg at 02/22/22 0404   ALPRAZolam (XANAX) tablet 1 mg  1 mg Oral TID Haddix, 04/24/22, MD   1 mg at 02/22/22 04/24/22   Chlorhexidine Gluconate Cloth 2 % PADS 6 each  6 each Topical Daily Haddix, 2355, MD   6 each at 02/19/22 0837   DAPTOmycin (CUBICIN) 600 mg in sodium chloride 0.9 % IVPB  600 mg Intravenous Q2000 Haddix, 04/21/22, MD 124 mL/hr at 02/21/22 2015 600 mg at 02/21/22 2015   enoxaparin (LOVENOX) injection 40 mg  40 mg Subcutaneous Q24H Haddix, 04/23/22, MD   40 mg at 02/22/22 0814   HYDROmorphone (DILAUDID) injection 1 mg  1 mg Intravenous Q3H PRN Haddix, 04/24/22, MD   1 mg at 02/21/22 1636   methocarbamol (ROBAXIN) tablet 750 mg  750 mg Oral Q6H PRN  Roby Lofts, MD   750 mg at 02/22/22 0401   naloxone [facility]) injection 0.4 mg  0.4 mg Intravenous PRN Haddix, Gillie Manners, MD       Oral care mouth rinse  15 mL Mouth Rinse PRN Haddix, Gillie Manners, MD       oxyCODONE (Oxy IR/ROXICODONE) immediate release tablet 10-20 mg  10-20 mg Oral Q4H PRN Freeman Caldron, PA-C   20 mg at 02/22/22 0813   polyethylene glycol (MIRALAX / GLYCOLAX) packet 17 g  17 g Oral BID Narda Bonds, MD   17 g at 02/21/22 0943   pregabalin (LYRICA) capsule 300 mg  300 mg Oral BID Haddix, Gillie Manners, MD   300 mg at 02/22/22 5366   QUEtiapine (SEROQUEL XR) 24 hr tablet 150 mg  150 mg Oral QHS Haddix, Gillie Manners, MD   150 mg at 02/21/22 2009    rifampin (RIFADIN) capsule 300 mg  300 mg Oral Q12H Judyann Munson, MD   300 mg at 02/22/22 4403   senna-docusate (Senokot-S) tablet 1 tablet  1 tablet Oral BID PRN HaddixGillie Manners, MD   1 tablet at 02/16/22 0946   sodium chloride flush (NS) 0.9 % injection 10-40 mL  10-40 mL Intracatheter Q12H Haddix, Gillie Manners, MD   10 mL at 02/22/22 4742   sodium chloride flush (NS) 0.9 % injection 10-40 mL  10-40 mL Intracatheter PRN Haddix, Gillie Manners, MD   10 mL at 02/21/22 5956     Discharge Medications: Please see discharge summary for a list of discharge medications.  Relevant Imaging Results:  Relevant Lab Results:   Additional Information SSN [id number]  Bess Kinds, RN

## 2022-02-23 ENCOUNTER — Other Ambulatory Visit (HOSPITAL_COMMUNITY): Payer: Self-pay

## 2022-02-23 MED ORDER — SODIUM CHLORIDE 0.9 % IV SOLN
600.0000 mg | Freq: Every day | INTRAVENOUS | Status: DC
  Administered 2022-02-23: 600 mg via INTRAVENOUS
  Filled 2022-02-23: qty 12

## 2022-02-23 MED ORDER — ENOXAPARIN SODIUM 40 MG/0.4ML IJ SOSY
40.0000 mg | PREFILLED_SYRINGE | INTRAMUSCULAR | 0 refills | Status: DC
  Filled 2022-02-23: qty 12, 30d supply, fill #0

## 2022-02-23 MED ORDER — HYDROCODONE-ACETAMINOPHEN 10-325 MG PO TABS
1.0000 | ORAL_TABLET | ORAL | 0 refills | Status: DC | PRN
Start: 2022-02-23 — End: 2022-03-25
  Filled 2022-02-23: qty 42, 7d supply, fill #0

## 2022-02-23 MED ORDER — HEPARIN SOD (PORK) LOCK FLUSH 100 UNIT/ML IV SOLN
250.0000 [IU] | INTRAVENOUS | Status: AC | PRN
  Administered 2022-02-23: 250 [IU]

## 2022-02-23 MED ORDER — METHOCARBAMOL 750 MG PO TABS
750.0000 mg | ORAL_TABLET | Freq: Four times a day (QID) | ORAL | 0 refills | Status: DC | PRN
  Filled 2022-02-23: qty 28, 7d supply, fill #0

## 2022-02-23 NOTE — Progress Notes (Signed)
Occupational Therapy Treatment Patient Details Name: Kerry Young MRN: [id number] DOB: 01-20-1966 Today's Date: 02/23/2022   History of present illness Pt is a 56 yr old old female who presented after fall from [facility] causing "oozing" from surgical site. Admitted for likely septic L knee joint/hardware. Orthopedics recommended ex fix though pt declined. Underwent I&D of L knee on 7/28. Pt previously  s/p 7/12 ORIF of L bicondylar plateau fx and repair of L lateral meniscus tear. PMH: UTI, anxiety, PONV, sciatica, PTSD, ORIF L ulnar fx, R IM nail of tibial, ORIF of R humerus. External fixator and I & D of left knee 08/02.   OT comments  Attempted to work with pt @ bed level for ADL tasks. Pt issued reacher and long handled sponge to help with ADL tasks. Attempted to work on anterior/posterior transfers to [facility] however pt declined - states she plans to use a bed pad instead of trying to get to [facility]. Footplate fits well, however pt not wearing on entry. Educated pt on importance of floating L heel. Plan is to DC today with ambulance transport home. Pt declining SNF therefore recommend HHOT and HH Aide.    Recommendations for follow up therapy are one component of a multi-disciplinary discharge planning process, led by the attending physician.  Recommendations may be updated based on patient status, additional functional criteria and insurance authorization.    Follow Up Recommendations  Skilled nursing-short term rehab (<3 hours/day)    Assistance Recommended at Discharge Frequent or constant Supervision/Assistance  Patient can return home with the following  Two people to help with walking and/or transfers;A lot of help with bathing/dressing/bathroom;Direct supervision/assist for medications management;Assist for transportation;Help with stairs or ramp for entrance;Assistance with cooking/housework   Equipment Recommendations  Hospital bed    Recommendations for Other Services      Precautions /  Restrictions Precautions Precautions: Fall Precaution Comments: external fixator in place; contact precautions Required Braces or Orthoses: Other Brace Splint/Cast - Date Prophylactic Dressing Applied (if applicable): 02/20/22 Other Brace: footplate Restrictions Weight Bearing Restrictions: Yes LLE Weight Bearing: Non weight bearing       Mobility Bed Mobility               General bed mobility comments: attempts made to work on bed mobility for ADL however pt declined    Transfers                   General transfer comment: unable     Balance                                           ADL either performed or assessed with clinical judgement   ADL                                       Functional mobility during ADLs:  (pt declined) General ADL Comments: educated on use of reacher and long handled sponge to help with LB ADL tasks. Attempted to engage pt in ant/post trnasfer to [facility] however pt stated she was not goint o use a BSC adn her plan is to use bedpads for both urine and BM. States "[name] will help me". States she is able to roll toward her side to pull out the bed pad, then throw it  away    Extremity/Trunk Assessment              Vision       Perception     Praxis      Cognition Arousal/Alertness: Awake/alert Behavior During Therapy: Anxious Overall Cognitive Status: Within Functional Limits for tasks assessed                                          Exercises      Shoulder Instructions       General Comments      Pertinent Vitals/ Pain       Pain Assessment Pain Assessment: 0-10 Pain Score: 10-Worst pain ever Pain Location: LLE Pain Descriptors / Indicators: Grimacing, Moaning Pain Intervention(s): Limited activity within patient's tolerance, Repositioned  Home Living                                          Prior Functioning/Environment               Frequency  Min 2X/week        Progress Toward Goals  OT Goals(current goals can now be found in the care plan section)  Progress towards OT goals: Not progressing toward goals - comment (self limiting)  Acute Rehab OT Goals Patient Stated Goal: to go home with assistance of her significant other OT Goal Formulation: With patient Time For Goal Achievement: 03/02/22 Potential to Achieve Goals: Fair ADL Goals Pt Will Perform Grooming: with modified independence;sitting Pt Will Perform Upper Body Bathing: with modified independence;sitting;bed level Pt Will Perform Lower Body Bathing: with modified independence;with min assist;sit to/from stand Pt Will Transfer to Toilet: with min assist;with transfer board;squat pivot transfer;bedside commode Pt Will Perform Tub/Shower Transfer: with mod assist;Squat pivot transfer;shower seat;rolling walker Additional ADL Goal #1: Pt to demo ability to sit EOB > 7 min during functional tasks without significant pain increase Additional ADL Goal #2: Pt to demo ability to gather ADL/IADL items from w/c level with Supervision to maximize participation with environment Additional ADL Goal #3: Pt/caregiver will independently donn/doff L footplate  Plan Discharge plan remains appropriate    Co-evaluation                 AM-PAC OT "6 Clicks" Daily Activity     Outcome Measure   Help from another person eating meals?: None Help from another person taking care of personal grooming?: A Little Help from another person toileting, which includes using toliet, bedpan, or urinal?: A Lot Help from another person bathing (including washing, rinsing, drying)?: A Lot Help from another person to put on and taking off regular upper body clothing?: A Little Help from another person to put on and taking off regular lower body clothing?: A Lot 6 Click Score: 16    End of Session    OT Visit Diagnosis: Unsteadiness on feet (R26.81);Other abnormalities of  gait and mobility (R26.89);Repeated falls (R29.6);Muscle weakness (generalized) (M62.81);History of falling (Z91.81);Pain Pain - Right/Left: Left Pain - part of body: Leg   Activity Tolerance Patient limited by pain;Other (comment) (self limiting)   Patient Left in bed;with call bell/phone within reach   Nurse Communication Other (comment) (DC needs)        Time: 1344-1400 OT Time Calculation (min): 16 min  Charges: OT  General Charges $OT Visit: 1 Visit OT Treatments $Self Care/Home Management : 8-22 mins  Luisa Dago, OT/L   Acute OT Clinical Specialist Acute Rehabilitation Services Pager [phone] Office [phone]   [facility] 02/23/2022, 2:09 PM

## 2022-02-23 NOTE — TOC Progression Note (Signed)
Transition of Care [facility]) - Progression Note    Patient Details  Name: Kerry Young MRN: [id number] Date of Birth: 24-Jun-1966  [facility]) CM/SW Contact  Bess Kinds, RN Phone Number: [phone] 02/23/2022, 8:34 AM  Clinical Narrative:     Telephone call to [facility] Medicaid to follow up with PCS form that was faxed to [facility] on 8/4. Message left with call back information for discharge planner case manager.   Expected Discharge Plan: Home w Home Health Services Barriers to Discharge: Continued Medical Work up  Expected Discharge Plan and Services Expected Discharge Plan: Home w Home Health Services         Expected Discharge Date: 02/21/22                                     Social Determinants of Health (SDOH) Interventions    Readmission Risk Interventions    02/21/2021    2:20 PM  Readmission Risk Prevention Plan  Post Dischage Appt Not Complete  Appt Comments DC to SNF  Medication Screening Complete  Transportation Screening Complete

## 2022-02-23 NOTE — Telephone Encounter (Signed)
Do not need to send today, but I don't know how many hospital will send. I guess we can refuse for now and address after patient discharge.

## 2022-02-23 NOTE — Telephone Encounter (Signed)
So I don't need to send, right?

## 2022-02-23 NOTE — Progress Notes (Signed)
No distress. Plan unchanged. Patient continues to decline SNF discharge. Stable for discharge home as previously planned.  Jacquelin Hawking, MD [facility] 02/23/2022, 3:02 PM

## 2022-02-23 NOTE — Progress Notes (Signed)
Physical Therapy Treatment Patient Details Name: Kerry Young MRN: [id number] DOB: 04-14-1966 Today's Date: 02/23/2022   History of Present Illness Pt is a 56 yr old old female who presented after fall from [facility] causing "oozing" from surgical site. Admitted for likely septic L knee joint/hardware. Orthopedics recommended ex fix though pt declined. Underwent I&D of L knee on 7/28. Pt previously  s/p 7/12 ORIF of L bicondylar plateau fx and repair of L lateral meniscus tear. PMH: UTI, anxiety, PONV, sciatica, PTSD, ORIF L ulnar fx, R IM nail of tibial, ORIF of R humerus. External fixator and I & D of left knee 08/02.    PT Comments    Pt with poor tolerance to session. Pt unable to complete full supine to sit transfer despite multiple attempts and extensive time due to fear of movement and severe pain. Pt refuses SNF and is not safe for discharge home from physical therapy standpoint; MD made aware. Pt's prognosis remains guarded.  Recommendations for follow up therapy are one component of a multi-disciplinary discharge planning process, led by the attending physician.  Recommendations may be updated based on patient status, additional functional criteria and insurance authorization.  Follow Up Recommendations  Skilled nursing-short term rehab (<3 hours/day) (Pt refuses) Can patient physically be transported by private vehicle: No   Assistance Recommended at Discharge Frequent or constant Supervision/Assistance  Patient can return home with the following A lot of help with walking and/or transfers;A lot of help with bathing/dressing/bathroom;Assistance with cooking/housework;Assist for transportation;Help with stairs or ramp for entrance;Two people to help with walking and/or transfers   Equipment Recommendations  None recommended by PT    Recommendations for Other Services       Precautions / Restrictions Precautions Precautions: Fall Precaution Comments: PCA pump; external fixator in  place; contact precautions Restrictions Weight Bearing Restrictions: Yes LLE Weight Bearing: Non weight bearing     Mobility  Bed Mobility Overal bed mobility: Needs Assistance Bed Mobility: Rolling, Supine to Sit Rolling: Min assist, Supervision, +2 for physical assistance   Supine to sit: Min assist, +2 for physical assistance     General bed mobility comments: Multiple attempts carried out for supine to sit transfer to left. Pt unable to complete transfer (performed ~60% of transfer) due to pain and fear. Pt utilized bed rail when rolling. Pt adamant about having therapist and tech assist her. Pt screaming in pain throughout.    Transfers                   General transfer comment: unable    Ambulation/Gait                   Stairs             Wheelchair Mobility    Modified Rankin (Stroke Patients Only)       Balance                                            Cognition Arousal/Alertness: Awake/alert Behavior During Therapy: Anxious Overall Cognitive Status: Within Functional Limits for tasks assessed (for basic mobility tasks)                                 General Comments: Fearful of movement and pain.  Exercises      General Comments        Pertinent Vitals/Pain Pain Assessment Pain Assessment: Faces Faces Pain Scale: Hurts worst Pain Location: LLE Pain Descriptors / Indicators: Grimacing, Moaning Pain Intervention(s): Premedicated before session, Monitored during session, Limited activity within patient's tolerance    Home Living                          Prior Function            PT Goals (current goals can now be found in the care plan section) Acute Rehab PT Goals Patient Stated Goal: Go home PT Goal Formulation: With patient Time For Goal Achievement: 03/01/22 Potential to Achieve Goals:  (guarded) Progress towards PT goals: Not progressing toward goals -  comment (pain and anxiety limiting)    Frequency    Min 3X/week      PT Plan Current plan remains appropriate    Co-evaluation              AM-PAC PT "6 Clicks" Mobility   Outcome Measure  Help needed turning from your back to your side while in a flat bed without using bedrails?: A Little Help needed moving from lying on your back to sitting on the side of a flat bed without using bedrails?: A Lot Help needed moving to and from a bed to a chair (including a wheelchair)?: Total Help needed standing up from a chair using your arms (e.g., wheelchair or bedside chair)?: Total Help needed to walk in hospital room?: Total Help needed climbing 3-5 steps with a railing? : Total 6 Click Score: 9    End of Session Equipment Utilized During Treatment:  (none) Activity Tolerance: Other (comment);Patient limited by pain (anxiety) Patient left: in bed;with call bell/phone within reach;with bed alarm set Nurse Communication: Mobility status PT Visit Diagnosis: Repeated falls (R29.6);Muscle weakness (generalized) (M62.81);Difficulty in walking, not elsewhere classified (R26.2);Pain Pain - Right/Left: Left Pain - part of body: Leg     Time: 5456-2563 PT Time Calculation (min) (ACUTE ONLY): 20 min  Charges:  $Therapeutic Activity: 8-22 mins                     Tana Coast, PT    [employer] 02/23/2022, 11:10 AM

## 2022-02-23 NOTE — Telephone Encounter (Signed)
Noted  

## 2022-02-23 NOTE — Progress Notes (Signed)
Pt very anxious and emotional as transport arrived. Pt transferred from bed to stretcher with tarp underneath without difficulty. Discharge paperwork, PICC line paperwork, long handled sponge, grabber and large brown paper bag stapled shut with medication labels given to pt. PICC line in place, CDI. External fixator remains in place. Emotional support given to pt throughout discharge process. Therapist, nutritional with pt and previous transport needs. Pt requested transport contact the fireman near her house to bring her up her stairs at home, stating they are familiar with her. Transport agreeable.

## 2022-02-23 NOTE — TOC Transition Note (Addendum)
Transition of Care [facility]) - CM/SW Discharge Note   Patient Details  Name: Kerry Young MRN: [id number] Date of Birth: 1966/04/20  Transition of Care [facility]) CM/SW Contact:  Bess Kinds, RN Phone Number: [phone] 02/23/2022, 12:54 PM   Clinical Narrative:     Patient to transition home today. PT worked with patient and SO today. Pam with [employer] provided education to SO at bedside. IV antibiotics to be delivered to the home today. [facility] arranged for [facility] RN to begin start of care tomorrow. Spoke with Derek Mound at [employer] [phone] to arrange stretcher transportation for 4pm - trip# [id number]. Discussed with patient to follow up with insurance concerning PCS. Patient stated [facility] agency was [facility]   Update 1540: Spoke with [facility] arranged for 4pm pick up.   Final next level of care: Home w Home Health Services Barriers to Discharge: No Barriers Identified   Patient Goals and CMS Choice Patient states their goals for this hospitalization and ongoing recovery are:: home with SO CMS Medicare.gov Compare Post Acute Care list provided to:: Patient Choice offered to / list presented to : Patient  Discharge Placement                       Discharge Plan and Services                DME Arranged: N/A DME Agency: NA       HH Arranged: IV Antibiotics, RN HH Agency: [facility] Date HH Agency Contacted: 02/23/22 Time HH Agency Contacted: 1254 Representative spoke with at [facility] Agency: Pam  Social Determinants of Health (SDOH) Interventions     Readmission Risk Interventions    02/21/2021    2:20 PM  Readmission Risk Prevention Plan  Post Dischage Appt Not Complete  Appt Comments DC to SNF  Medication Screening Complete  Transportation Screening Complete

## 2022-02-23 NOTE — Progress Notes (Signed)
Orthopaedic Trauma Progress Note  S: Doing fair today. Still having a lot of pain in the left knee.  Does not feel that the Oxy IR 10 to 20 mg has much "immediate release".  She is concerned that the oxycodone will drop her blood pressure when at home after discharge.  Patient is requesting I switch her to Norco 10-325 mg as she has taken this medication at a lower dose in the past without issues.  Notes she will be discharging to her boyfriend's house.  He should be here the hospital around 11 AM to observe therapies.  O:  Vitals:   02/23/22 0531 02/23/22 0755  BP: 104/64 127/88  Pulse: 95 92  Resp: 18 17  Temp: 98.1 F (36.7 C) 97.6 F (36.4 C)  SpO2: 97% 95%   General: Sitting up in bed. Intermittently anxious and tearful.  Is able to answer questions and converse appropriately. Left lower extremity: Ex-Fix is in place and is clean dry and intact.  Dressing changed, incision without active drainage.  Compartments are soft and compressible.  She has severe pain in her foot with passive dorsiflexion.  She is not able to actively dorsiflex her foot or ankle.  Able to wiggle her toes some.  Hypersensitive to light touch over the dorsal and plantar aspect of her foot. + DP pulse  Imaging: None performed  Labs: No results found for this or any previous visit (from the past 24 hour(s)).  Assessment: 56 year old female with complex postoperative infection with septic arthritis and failed fixation status post external fixation and I&D x2  Weightbearing: NWB LLE Incisional and dressing care: Dressing changed today.  Keep in place.   Orthopedic device(s): Ex-Fix with fabricated foot plate CV/Blood loss: Hemodynamically stable.  Pain management: Continue current regimen.    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  Patient okay for discharge  from Ortho standpoint with Ex-Fix in place.  I have sent discharge Rx to [facility] pharmacy.  I have switched oxycodone to Norco 10-325 mg per patient's request.  Follow - up plan: Follow-up with Dr. [name] on 03/03/2022 at 2 PM for wound check and repeat x-rays   Thompson Caul PA-C [facility] [phone] (office) [email or web address]

## 2022-02-24 ENCOUNTER — Telehealth: Payer: Self-pay | Admitting: Physician Assistant

## 2022-02-24 ENCOUNTER — Other Ambulatory Visit: Payer: Self-pay | Admitting: Physician Assistant

## 2022-02-24 MED ORDER — ALPRAZOLAM 1 MG PO TABS
1.0000 mg | ORAL_TABLET | Freq: Three times a day (TID) | ORAL | 0 refills | Status: DC | PRN
Start: 2022-02-24 — End: 2022-02-26

## 2022-02-24 NOTE — Telephone Encounter (Signed)
I sent in the prescription for 1 month supply.  Please let her know that.  Thank you.

## 2022-02-24 NOTE — Telephone Encounter (Signed)
Patient called requesting refill for 1mg . States she has been in the hospital for about twelve days and having several surgeries done on her legs. She is now home and she doesn't have any at home. Ph: [phone] Appt 1/8 Pharmacy [facility] [address] [place]

## 2022-02-24 NOTE — Telephone Encounter (Signed)
Please review

## 2022-02-26 ENCOUNTER — Other Ambulatory Visit: Payer: Self-pay

## 2022-02-26 NOTE — Telephone Encounter (Signed)
Pt called back about the Xanax script at 4:13p.  She said her insurance has her "locked" into only being able to get the medicine from [facility].  She wants it sent to the one on [address]  She said she also thinks they will require a PA for it.  Pls call he at [phone] if you need more info.[name]  Next appt 1/8

## 2022-02-26 NOTE — Telephone Encounter (Signed)
Cancelled and pended  

## 2022-02-27 MED ORDER — ALPRAZOLAM 1 MG PO TABS: 1.0000 mg | ORAL_TABLET | Freq: Three times a day (TID) | ORAL | 0 refills | Status: DC | PRN

## 2022-02-27 NOTE — Progress Notes (Signed)
Received call back at 5:36 pm voicemail from [place], [facility] care Production designer, theatre/television/film. Talbert Forest is covering for assigned care manager today. Call back number [phone]. Talbert Forest confirmed receipt of PCS application and advised that she will expedite services.   Hortencia Conradi, MSN RN CCM Transitions of Care - [place] [phone]

## 2022-03-10 ENCOUNTER — Telehealth: Payer: Self-pay

## 2022-03-10 ENCOUNTER — Ambulatory Visit: Payer: Self-pay | Admitting: Student

## 2022-03-10 NOTE — Telephone Encounter (Signed)
Patient called front desk to cancel her appointment on 8/24, says she will be in the hospital for surgery. Advised scheduling staff to reschedule follow up appointment for once patient is discharged.   Sandie Ano, RN

## 2022-03-11 NOTE — Telephone Encounter (Signed)
Tried calling Alyssa to find out where her surgery is being done, no answer. Left HIPAA compliant voicemail requesting callback.   Sandie Ano, RN

## 2022-03-12 ENCOUNTER — Ambulatory Visit: Payer: Medicaid Other | Admitting: Infectious Disease

## 2022-03-16 NOTE — Telephone Encounter (Signed)
Patient called stating she wanted to be sure Dr. Zenaida Niece dam Seen her in hospital on 03/25/22 when she has surgery. Patient will schedule follow up once she is discharged

## 2022-03-18 ENCOUNTER — Telehealth: Payer: Self-pay | Admitting: Physician Assistant

## 2022-03-18 NOTE — Telephone Encounter (Signed)
Pt wants request for refill of Xanax put in.  She has received the 8/11 script and expects next script 9/10.  She wants it sent to [facility].  Next appt 1/8

## 2022-03-18 NOTE — Telephone Encounter (Signed)
Due 9/9, will postpone.

## 2022-03-19 ENCOUNTER — Encounter (HOSPITAL_COMMUNITY): Payer: Self-pay | Admitting: Student

## 2022-03-19 NOTE — Progress Notes (Signed)
Anesthesia Review:  PCP: Romie Jumper Last visit telemedicine on 03/12/22.  Cardiologist : Chest x-ray : 1v- 06/29/21  EKG : 02/15/22- Sinus Tach  Echo : Stress test: Cardiac Cath :  Activity level:  Sleep Study/ CPAP : Fasting Blood Sugar :      / Checks Blood Sugar -- times a day:   Blood Thinner/ Instructions /Last Dose: ASA / Instructions/ Last Dose :

## 2022-03-22 ENCOUNTER — Other Ambulatory Visit: Payer: Self-pay

## 2022-03-22 ENCOUNTER — Encounter (HOSPITAL_COMMUNITY): Payer: Self-pay | Admitting: Student

## 2022-03-22 NOTE — Progress Notes (Signed)
Pre-op instructions given to pt. No one has given her instructions if or when to stop lovenox injections. Instructed pt. To call Dr. Luvenia Starch office for clarification. She states he has ordered the injections.  Pt. Stated  [facility] needs to be called when ready to be discharge from hospital, [phone]. Pt. Stated she will have to call non-emergent 911 for transportation to hospital on day of surgery. States it is too late to schedule transportation with [facility].She is bed bound and they use a stretcher to get her into [facility] vehicle.

## 2022-03-24 NOTE — Anesthesia Preprocedure Evaluation (Signed)
Anesthesia Evaluation  Patient identified by MRN, date of birth, ID band Patient awake    Reviewed: Allergy & Precautions, NPO status , Patient's Chart, lab work & pertinent test results  History of Anesthesia Complications (+) PONV and history of anesthetic complications  Airway Mallampati: III  TM Distance: >3 FB Neck ROM: Full    Dental  (+) Chipped, Dental Advisory Given   Pulmonary Current Smoker,    Pulmonary exam normal breath sounds clear to auscultation       Cardiovascular negative cardio ROS Normal cardiovascular exam Rhythm:Regular Rate:Normal     Neuro/Psych  Headaches, PSYCHIATRIC DISORDERS Anxiety Depression  Neuromuscular disease    GI/Hepatic negative GI ROS, Neg liver ROS,   Endo/Other  negative endocrine ROS  Renal/GU Lab Results      Component                Value               Date                      CREATININE               0.72                02/16/2022                BUN                      13                  02/16/2022                NA                       136                 02/16/2022                K                        3.7                 02/16/2022                negative genitourinary   Musculoskeletal  (+) Arthritis ,   Abdominal (+) + obese,   Peds  Hematology  (+) Blood dyscrasia, anemia , Lab Results      Component                Value               Date                      WBC                      10.0                02/16/2022                HGB                      9.7 (L)             02/16/2022                HCT  31.1 (L)            02/16/2022    3                PLT                      456 (H)             02/16/2022              Anesthesia Other Findings  L knee infection and instability s/p ORIF tibial plateau  MRSA  Reproductive/Obstetrics                           Anesthesia Physical  Anesthesia Plan  ASA:  2  Anesthesia Plan: General   Post-op Pain Management: Ketamine IV*, Dilaudid IV, Gabapentin PO (pre-op)* and Tylenol PO (pre-op)*   Induction: Intravenous  PONV Risk Score and Plan: 3 and Ondansetron, Dexamethasone, Midazolam and Treatment may vary due to age or medical condition  Airway Management Planned: LMA  Additional Equipment: None  Intra-op Plan:   Post-operative Plan: Extubation in OR  Informed Consent: I have reviewed the patients History and Physical, chart, labs and discussed the procedure including the risks, benefits and alternatives for the proposed anesthesia with the patient or authorized representative who has indicated his/her understanding and acceptance.     Dental advisory given  Plan Discussed with: CRNA  Anesthesia Plan Comments:        Anesthesia Quick Evaluation

## 2022-03-24 NOTE — H&P (Signed)
[facility]uma [facility] (OTS) H&P  Patient ID: Kerry Young MRN: [id number] DOB/AGE: 1965/11/02 56 y.o.  Reason for Surgery: Removal of external fixator left knee  HPI: Kerry Young is an 56 y.o. female presenting for surgery on left lower extremity. Patient fell down some steps in July 2023, resulting in left proximal tibia fracture.  Underwent ORIF of left proximal tibia by Dr. [name] on 01/28/2022.  Was noted to have some instability of the left knee on exam intraoperatively.  Patient was placed in a hinged brace but was noncompliant with wearing the brace.  Patient was subsequently discharged home from the hospital on 01/31/2022 but was readmitted to [facility] on 02/13/2022 for left knee infection.  Underwent I&D of the left knee with placement of external fixator due to left knee instability.  She is followed up in the OTS clinic postoperatively and now presents for removal of external fixator.  Past Medical History:  Diagnosis Date   Anxiety    Arthritis    Migraines    PONV (postoperative nausea and vomiting)    PTSD (post-traumatic stress disorder)    Sciatica    UTI (urinary tract infection)     Past Surgical History:  Procedure Laterality Date   COLPOSCOPY N/A 10/26/2019   Procedure: COLPOSCOPY with cervical biopsy;  Surgeon: Myna Hidalgo, DO;  Location: [facility];  Service: Gynecology;  Laterality: N/A;   EXTERNAL FIXATION LEG Left 02/18/2022   Procedure: EXTERNAL FIXATION LEG;  Surgeon: Roby Lofts, MD;  Location: [facility] OR;  Service: Orthopedics;  Laterality: Left;   I & D EXTREMITY Left 02/13/2022   Procedure: IRRIGATION AND DEBRIDEMENT EXTREMITY;  Surgeon: Roby Lofts, MD;  Location: [facility] OR;  Service: Orthopedics;  Laterality: Left;   ORIF HUMERUS FRACTURE Right 02/07/2021   Procedure: Arthroscopic repair rotator cuff and fixation of PROXIMAL HUMERUS FRACTURE;  Surgeon: Bjorn Pippin, MD;  Location: [facility] OR;  Service: Orthopedics;  Laterality:  Right;   ORIF TIBIA PLATEAU Left 01/28/2022   Procedure: OPEN REDUCTION INTERNAL FIXATION (ORIF) TIBIAL PLATEAU;  Surgeon: Roby Lofts, MD;  Location: [facility] OR;  Service: Orthopedics;  Laterality: Left;   ORIF ULNAR FRACTURE Left 02/18/2021   Procedure: OPEN REDUCTION INTERNAL FIXATION (ORIF) ULNAR FRACTURE;  Surgeon: Sheral Apley, MD;  Location: [facility] OR;  Service: Orthopedics;  Laterality: Left;   TIBIA IM NAIL INSERTION Right 02/05/2021   Procedure: INTRAMEDULLARY (IM) NAIL TIBIAL;  Surgeon: Bjorn Pippin, MD;  Location: [facility] OR;  Service: Orthopedics;  Laterality: Right;   WISDOM TOOTH EXTRACTION      Family History  Problem Relation Age of Onset   Anxiety disorder Mother    Hypertension Mother    Kidney failure Mother    Heart attack Paternal Grandfather    Healthy Daughter     Social History:  reports that she has been smoking cigarettes. She has a 12.50 pack-year smoking history. She has never used smokeless tobacco. She reports that she does not currently use alcohol. She reports that she does not use drugs.  Allergies:  Allergies  Allergen Reactions   Amitriptyline Other (See Comments)    Cold sores on mouth, blister on tongue. No facial swelling    Celexa [Citalopram] Nausea And Vomiting    Took it 2 days. Had n/v and migraine   Methocarbamol Nausea Only   Naproxen Nausea Only   Other Nausea And Vomiting    ALL ANTIBIOTICS   Zofran [Ondansetron] Other (See Comments)  headache    Medications: I have reviewed the patient's current medications. Prior to Admission:  No medications prior to admission.    ROS: Constitutional: No fever or chills Vision: No changes in vision ENT: No difficulty swallowing CV: No chest pain Pulm: No SOB or wheezing GI: No nausea or vomiting GU: No urgency or inability to hold urine Skin: No poor wound healing Neurologic: No numbness or tingling Psychiatric: No depression or anxiety Heme: No bruising Allergic: No reaction to  medications or food   Exam: Height 5\' 2"  (1.575 m), weight 90.7 kg. General:NAD Orientation:Alert and oriented x3 Mood and Affect: Mood and affect appropriate Gait: NWB LLE Coordination and balance: Within normal limits  LLE: Ex-fix in place. Pin sites stable, no signs of infection. Incision healing well, sutures in place. Mild ankle contracture. Endorses sensation throughout. +DP pulse  RLE: Skin without lesions. No tenderness to palpation. Full painless ROM, full strength in each muscle groups without evidence of instability.   Medical Decision Making: Data: Imaging: AP and lateral views of left knee show proximal tibia fixation and outline of external fixator in place. Knee joint remains in appropriate alignment. Increased callus over the proximal tibia fracture. No signs of hardware failure/loosening.  Labs: No results found for this or any previous visit (from the past 168 hour(s)).   Assessment/Plan: 56 year old female s/p ORIF left proximal tibia fracture 01/28/22 with subsequent infection requiring I&D  02/13/22 and external fixation left knee 02/18/22 due to knee instability  Patient has had external fixation in place for 4 weeks. She has tolerated this fairly well. Imaging shows increased callus over her fracture. Would recommend remove of external fixator at this point. Risks and benefits of the procedure have been discussed with the patient. She agrees to proceed. Will plan to discharge patient home pot-operatively.    04/20/22 PA-C [facility] [phone] (office) [email or web address]

## 2022-03-25 ENCOUNTER — Other Ambulatory Visit: Payer: Self-pay

## 2022-03-25 ENCOUNTER — Encounter (HOSPITAL_COMMUNITY)
Admission: RE | Disposition: A | Discharge status: Home / Self Care | Payer: Self-pay | Source: Home / Self Care | Attending: Student

## 2022-03-25 ENCOUNTER — Telehealth: Payer: Self-pay | Admitting: Infectious Disease

## 2022-03-25 ENCOUNTER — Ambulatory Visit (HOSPITAL_COMMUNITY): Payer: Medicaid Other | Admitting: Anesthesiology

## 2022-03-25 ENCOUNTER — Encounter (HOSPITAL_COMMUNITY): Payer: Self-pay | Admitting: Student

## 2022-03-25 ENCOUNTER — Ambulatory Visit (HOSPITAL_COMMUNITY)
Admission: RE | Admit: 2022-03-25 | Discharge: 2022-03-25 | Disposition: A | Discharge status: Home / Self Care | Payer: Medicaid Other | Attending: Student | Admitting: Student

## 2022-03-25 ENCOUNTER — Ambulatory Visit (HOSPITAL_COMMUNITY): Payer: Medicaid Other

## 2022-03-25 ENCOUNTER — Ambulatory Visit (HOSPITAL_BASED_OUTPATIENT_CLINIC_OR_DEPARTMENT_OTHER): Payer: Medicaid Other | Admitting: Anesthesiology

## 2022-03-25 DIAGNOSIS — T8140XA Infection following a procedure, unspecified, initial encounter: Secondary | ICD-10-CM | POA: Diagnosis present

## 2022-03-25 DIAGNOSIS — A4902 Methicillin resistant Staphylococcus aureus infection, unspecified site: Secondary | ICD-10-CM | POA: Diagnosis not present

## 2022-03-25 DIAGNOSIS — M00062 Staphylococcal arthritis, left knee: Secondary | ICD-10-CM

## 2022-03-25 DIAGNOSIS — T847XXA Infection and inflammatory reaction due to other internal orthopedic prosthetic devices, implants and grafts, initial encounter: Secondary | ICD-10-CM

## 2022-03-25 DIAGNOSIS — Z01818 Encounter for other preprocedural examination: Secondary | ICD-10-CM

## 2022-03-25 DIAGNOSIS — Z6836 Body mass index (BMI) 36.0-36.9, adult: Secondary | ICD-10-CM | POA: Diagnosis not present

## 2022-03-25 DIAGNOSIS — M86662 Other chronic osteomyelitis, left tibia and fibula: Secondary | ICD-10-CM | POA: Diagnosis not present

## 2022-03-25 DIAGNOSIS — Z4689 Encounter for fitting and adjustment of other specified devices: Secondary | ICD-10-CM | POA: Diagnosis not present

## 2022-03-25 DIAGNOSIS — M009 Pyogenic arthritis, unspecified: Secondary | ICD-10-CM | POA: Insufficient documentation

## 2022-03-25 DIAGNOSIS — T847XXD Infection and inflammatory reaction due to other internal orthopedic prosthetic devices, implants and grafts, subsequent encounter: Secondary | ICD-10-CM

## 2022-03-25 DIAGNOSIS — W109XXD Fall (on) (from) unspecified stairs and steps, subsequent encounter: Secondary | ICD-10-CM | POA: Insufficient documentation

## 2022-03-25 DIAGNOSIS — M869 Osteomyelitis, unspecified: Secondary | ICD-10-CM

## 2022-03-25 DIAGNOSIS — M8660 Other chronic osteomyelitis, unspecified site: Secondary | ICD-10-CM

## 2022-03-25 DIAGNOSIS — F17210 Nicotine dependence, cigarettes, uncomplicated: Secondary | ICD-10-CM | POA: Insufficient documentation

## 2022-03-25 DIAGNOSIS — M00069 Staphylococcal arthritis, unspecified knee: Secondary | ICD-10-CM

## 2022-03-25 DIAGNOSIS — E669 Obesity, unspecified: Secondary | ICD-10-CM | POA: Diagnosis not present

## 2022-03-25 DIAGNOSIS — S82142D Displaced bicondylar fracture of left tibia, subsequent encounter for closed fracture with routine healing: Secondary | ICD-10-CM | POA: Insufficient documentation

## 2022-03-25 HISTORY — PX: EXTERNAL FIXATION REMOVAL: SHX5040

## 2022-03-25 HISTORY — DX: Unspecified osteoarthritis, unspecified site: M19.90

## 2022-03-25 LAB — CBC
HCT: 40.2 % (ref 36.0–46.0)
Hemoglobin: 12 g/dL (ref 12.0–15.0)
MCH: 26.8 pg (ref 26.0–34.0)
MCHC: 29.9 g/dL — ABNORMAL LOW (ref 30.0–36.0)
MCV: 89.7 fL (ref 80.0–100.0)
Platelets: 330 10*3/uL (ref 150–400)
RBC: 4.48 MIL/uL (ref 3.87–5.11)
RDW: 15 % (ref 11.5–15.5)
WBC: 8.5 10*3/uL (ref 4.0–10.5)
nRBC: 0 % (ref 0.0–0.2)

## 2022-03-25 LAB — BASIC METABOLIC PANEL
Anion gap: 9 (ref 5–15)
BUN: 8 mg/dL (ref 6–20)
CO2: 23 mmol/L (ref 22–32)
Calcium: 8.8 mg/dL — ABNORMAL LOW (ref 8.9–10.3)
Chloride: 108 mmol/L (ref 98–111)
Creatinine, Ser: 0.67 mg/dL (ref 0.44–1.00)
GFR, Estimated: >60 mL/min (ref >60)
Glucose, Bld: 90 mg/dL (ref 70–99)
Potassium: 3.8 mmol/L (ref 3.5–5.1)
Sodium: 140 mmol/L (ref 135–145)

## 2022-03-25 LAB — SURGICAL PCR SCREEN
MRSA, PCR: NEGATIVE
Staphylococcus aureus: NEGATIVE

## 2022-03-25 SURGERY — REMOVAL, EXTERNAL FIXATION DEVICE, LOWER EXTREMITY
Anesthesia: General | Laterality: Left

## 2022-03-25 MED ORDER — FENTANYL CITRATE (PF) 100 MCG/2ML IJ SOLN
INTRAMUSCULAR | Status: AC
  Filled 2022-03-25: qty 2

## 2022-03-25 MED ORDER — LACTATED RINGERS IV SOLN: INTRAVENOUS | Status: DC

## 2022-03-25 MED ORDER — GABAPENTIN 300 MG PO CAPS
ORAL_CAPSULE | ORAL | Status: AC
  Administered 2022-03-25: 300 mg via ORAL
  Filled 2022-03-25: qty 1

## 2022-03-25 MED ORDER — AMISULPRIDE (ANTIEMETIC) 5 MG/2ML IV SOLN: 10.0000 mg | Freq: Once | INTRAVENOUS | Status: DC | PRN

## 2022-03-25 MED ORDER — ONDANSETRON HCL 4 MG/2ML IJ SOLN
INTRAMUSCULAR | Status: DC | PRN
  Administered 2022-03-25: 4 mg via INTRAVENOUS

## 2022-03-25 MED ORDER — PROPOFOL 10 MG/ML IV BOLUS
INTRAVENOUS | Status: DC | PRN
  Administered 2022-03-25: 150 mg via INTRAVENOUS

## 2022-03-25 MED ORDER — ORAL CARE MOUTH RINSE: 15.0000 mL | Freq: Once | OROMUCOSAL | Status: AC

## 2022-03-25 MED ORDER — PROPOFOL 10 MG/ML IV BOLUS
INTRAVENOUS | Status: AC
  Filled 2022-03-25: qty 20

## 2022-03-25 MED ORDER — MIDAZOLAM HCL 2 MG/2ML IJ SOLN
INTRAMUSCULAR | Status: DC | PRN
  Administered 2022-03-25: 2 mg via INTRAVENOUS

## 2022-03-25 MED ORDER — FENTANYL CITRATE (PF) 100 MCG/2ML IJ SOLN
50.0000 ug | Freq: Once | INTRAMUSCULAR | Status: AC
  Administered 2022-03-25: 50 ug via INTRAVENOUS

## 2022-03-25 MED ORDER — PROMETHAZINE HCL 25 MG/ML IJ SOLN: 6.2500 mg | INTRAMUSCULAR | Status: DC | PRN

## 2022-03-25 MED ORDER — MEPERIDINE HCL 25 MG/ML IJ SOLN: 6.2500 mg | INTRAMUSCULAR | Status: DC | PRN

## 2022-03-25 MED ORDER — GABAPENTIN 300 MG PO CAPS: 300.0000 mg | ORAL_CAPSULE | Freq: Once | ORAL | Status: AC

## 2022-03-25 MED ORDER — CHLORHEXIDINE GLUCONATE 0.12 % MT SOLN
OROMUCOSAL | Status: AC
  Administered 2022-03-25: 15 mL via OROMUCOSAL
  Filled 2022-03-25: qty 15

## 2022-03-25 MED ORDER — ACETAMINOPHEN 500 MG PO TABS: 1000.0000 mg | ORAL_TABLET | Freq: Once | ORAL | Status: AC

## 2022-03-25 MED ORDER — DEXAMETHASONE SODIUM PHOSPHATE 10 MG/ML IJ SOLN
INTRAMUSCULAR | Status: DC | PRN
  Administered 2022-03-25: 10 mg via INTRAVENOUS

## 2022-03-25 MED ORDER — ACETAMINOPHEN 500 MG PO TABS
ORAL_TABLET | ORAL | Status: AC
  Administered 2022-03-25: 1000 mg via ORAL
  Filled 2022-03-25: qty 2

## 2022-03-25 MED ORDER — KETAMINE HCL 50 MG/5ML IJ SOSY
PREFILLED_SYRINGE | INTRAMUSCULAR | Status: AC
  Filled 2022-03-25: qty 5

## 2022-03-25 MED ORDER — FENTANYL CITRATE (PF) 250 MCG/5ML IJ SOLN
INTRAMUSCULAR | Status: AC
  Filled 2022-03-25: qty 5

## 2022-03-25 MED ORDER — FENTANYL CITRATE (PF) 250 MCG/5ML IJ SOLN
INTRAMUSCULAR | Status: DC | PRN
  Administered 2022-03-25: 50 ug via INTRAVENOUS

## 2022-03-25 MED ORDER — HYDROMORPHONE HCL 1 MG/ML IJ SOLN
0.2500 mg | INTRAMUSCULAR | Status: DC | PRN
  Administered 2022-03-25 (×4): 0.5 mg via INTRAVENOUS

## 2022-03-25 MED ORDER — HYDROMORPHONE HCL 1 MG/ML IJ SOLN
INTRAMUSCULAR | Status: AC
  Filled 2022-03-25: qty 1

## 2022-03-25 MED ORDER — CHLORHEXIDINE GLUCONATE 0.12 % MT SOLN: 15.0000 mL | Freq: Once | OROMUCOSAL | Status: AC

## 2022-03-25 MED ORDER — KETAMINE HCL 10 MG/ML IJ SOLN
INTRAMUSCULAR | Status: DC | PRN
  Administered 2022-03-25 (×2): 25 mg via INTRAVENOUS

## 2022-03-25 MED ORDER — MIDAZOLAM HCL 2 MG/2ML IJ SOLN
INTRAMUSCULAR | Status: AC
  Filled 2022-03-25: qty 2

## 2022-03-25 SURGICAL SUPPLY — 45 items
APL PRP STRL LF DISP 70% ISPRP (MISCELLANEOUS)
BAG COUNTER SPONGE SURGICOUNT (BAG) ×1 IMPLANT
BAG SPNG CNTER NS LX DISP (BAG)
BNDG CMPR MED 10X6 ELC LF (GAUZE/BANDAGES/DRESSINGS) ×1
BNDG ELASTIC 4X5.8 VLCR STR LF (GAUZE/BANDAGES/DRESSINGS) ×1 IMPLANT
BNDG ELASTIC 6X10 VLCR STRL LF (GAUZE/BANDAGES/DRESSINGS) IMPLANT
BNDG ELASTIC 6X5.8 VLCR STR LF (GAUZE/BANDAGES/DRESSINGS) ×1 IMPLANT
BNDG GAUZE DERMACEA FLUFF 4 (GAUZE/BANDAGES/DRESSINGS) ×2 IMPLANT
BNDG GZE DERMACEA 4 6PLY (GAUZE/BANDAGES/DRESSINGS)
BRUSH SCRUB EZ PLAIN DRY (MISCELLANEOUS) ×2 IMPLANT
CHLORAPREP W/TINT 26 (MISCELLANEOUS) ×1 IMPLANT
COVER SURGICAL LIGHT HANDLE (MISCELLANEOUS) ×2 IMPLANT
DRAPE C-ARM 42X72 X-RAY (DRAPES) IMPLANT
DRAPE C-ARMOR (DRAPES) IMPLANT
DRAPE U-SHAPE 47X51 STRL (DRAPES) ×1 IMPLANT
DRSG ADAPTIC 3X8 NADH LF (GAUZE/BANDAGES/DRESSINGS) ×1 IMPLANT
ELECT REM PT RETURN 9FT ADLT (ELECTROSURGICAL)
ELECTRODE REM PT RTRN 9FT ADLT (ELECTROSURGICAL) ×1 IMPLANT
GAUZE SPONGE 4X4 12PLY STRL (GAUZE/BANDAGES/DRESSINGS) ×1 IMPLANT
GAUZE SPONGE 4X4 12PLY STRL LF (GAUZE/BANDAGES/DRESSINGS) IMPLANT
GLOVE BIO SURGEON STRL SZ 6.5 (GLOVE) ×3 IMPLANT
GLOVE BIO SURGEON STRL SZ7.5 (GLOVE) ×4 IMPLANT
GLOVE BIOGEL PI IND STRL 6.5 (GLOVE) ×1 IMPLANT
GLOVE BIOGEL PI IND STRL 7.5 (GLOVE) ×1 IMPLANT
GOWN STRL REUS W/ TWL LRG LVL3 (GOWN DISPOSABLE) ×2 IMPLANT
GOWN STRL REUS W/TWL LRG LVL3 (GOWN DISPOSABLE)
IMMOBILIZER KNEE 20 (SOFTGOODS) ×1
IMMOBILIZER KNEE 20 THIGH 36 (SOFTGOODS) IMPLANT
KIT BASIN OR (CUSTOM PROCEDURE TRAY) ×1 IMPLANT
KIT TURNOVER KIT B (KITS) ×1 IMPLANT
MANIFOLD NEPTUNE II (INSTRUMENTS) ×1 IMPLANT
NS IRRIG 1000ML POUR BTL (IV SOLUTION) ×1 IMPLANT
PACK TOTAL JOINT (CUSTOM PROCEDURE TRAY) ×1 IMPLANT
PAD ARMBOARD 7.5X6 YLW CONV (MISCELLANEOUS) ×2 IMPLANT
PADDING CAST COTTON 6X4 STRL (CAST SUPPLIES) ×3 IMPLANT
PADDING WEBRIL 4 STERILE (GAUZE/BANDAGES/DRESSINGS) IMPLANT
PADDING WEBRIL 6 STERILE (GAUZE/BANDAGES/DRESSINGS) IMPLANT
SPONGE T-LAP 18X18 ~~LOC~~+RFID (SPONGE) ×1 IMPLANT
STAPLER VISISTAT 35W (STAPLE) IMPLANT
SUT MNCRL AB 3-0 PS2 18 (SUTURE) ×1 IMPLANT
SUT MON AB 2-0 CT1 36 (SUTURE) ×1 IMPLANT
TOWEL GREEN STERILE (TOWEL DISPOSABLE) ×2 IMPLANT
TOWEL GREEN STERILE FF (TOWEL DISPOSABLE) ×2 IMPLANT
UNDERPAD 30X36 HEAVY ABSORB (UNDERPADS AND DIAPERS) ×1 IMPLANT
WATER STERILE IRR 1000ML POUR (IV SOLUTION) ×2 IMPLANT

## 2022-03-25 NOTE — Progress Notes (Signed)
Patient arrived in short stay with pain 10/10 in her left leg, surgical site. Per Dr. Renold Don verbal order patient received 50 mcg Fentanyl IV. Pain level decreased to 6/10. Will continue to monitor.

## 2022-03-25 NOTE — Op Note (Signed)
Orthopaedic Surgery Operative Note (CSN: [id number] ) Date of Surgery: 03/25/2022  Admit Date: 03/25/2022   Diagnoses: Pre-Op Diagnoses: Left bicondylar tibial plateau fracture Left knee septic arthritis with postoperative infection  Post-Op Diagnosis: Same  Procedures: CPT 20694-Removal of external fixation of left knee CPT 11044-Debridement of external fixator pin sites  Surgeons : Primary: Roby Lofts, MD  Assistant: Ulyses Southward, PA-C  Location: OR 7   Anesthesia:General   Antibiotics: As scheduled   Tourniquet time:None    Estimated Blood Loss:Minimal  Complications:none  Specimens:none   Implants: * No implants in log *   Indications for Surgery: 56 year old female that sustained a left bicondylar tibial plateau fracture that underwent open reduction internal fixation on July 12.  She subsequently developed a significant postoperative infection requiring I&D and then another I&D with external fixation placement due to arm stable knee.  She was in the external fixator for 6 weeks.  I felt that she was indicated for removal of the external fixator.  Risks and benefits were discussed with the patient.  Risks included but not limited to bleeding, infection, persistent nonunion, knee stiffness, knee arthritis, need for further surgery including hardware removal, nerve or blood vessel injury, even the possibility anesthetic complications.  She agreed proceed with surgery and consent was obtained.  Operative Findings: Removal of spanning knee external fixation with debridement of Ex-Fix pin sites.  Knee was stable after removal of external fixator.  Procedure: The patient was identified in the preoperative holding area. Consent was confirmed with the patient and their family and all questions were answered. The operative extremity was marked after confirmation with the patient. she was then brought back to the operating room by our anesthesia colleagues.  She was placed  under general anesthetic and carefully transferred over to a radiolucent flat top table.  A timeout was performed to verify the patient, the procedure, and the extremity.  Preoperative antibiotics were not dosed due to delay in schedule for her postoperative infection.  Fluoroscopic imaging obtained showed that the hardware was still in place no signs of any failure or loosening.  The external fixator was then removed.  The Ex-Fix pins were removed as well.  Fluoroscopic imaging showed stable knee after removal with no instability on the coronal or sagittal plane.  A curette was used to debride the external fixator pin sites.  These were irrigated.  A dressing was placed and a knee immobilizer was applied.  Patient was then awoken from anesthesia and taken to the PACU in stable condition.  Post Op Plan/Instructions: Patient will be weightbearing as tolerated to the left lower extremity.  She will continue her IV antibiotics per infectious disease.  She will return to the office in 2 to 3 weeks for repeat x-rays.  I was present and performed the entire surgery.  Ulyses Southward, PA-C did assist me throughout the case. An assistant was necessary given the difficulty in approach, maintenance of reduction and ability to instrument the fracture.   Truitt Merle, MD [facility]

## 2022-03-25 NOTE — Consult Note (Deleted)
I am attempting to write a consult note and bill for one but not workint it appears because the pt not inpatieent?  We are planning on extending her IV daptomycin post hardware removal  I have seen and examined the pt in perioperative area

## 2022-03-25 NOTE — Discharge Instructions (Addendum)
//  Or[facility] Discharge Instructions   General Discharge Instructions  WEIGHT BEARING STATUS: Weightbearing as tolerated left lower extremity in knee immobilizer  RANGE OF MOTION/ACTIVITY: Ok for gentle knee range of motion as tolerated out of the immobilizer. Have immobilizer in place when ambulating  PAIN MANAGEMENT: Continue Flexeril 10 mg and Oxycodone 10 mg. A Oxycodone refill has been sent to your pharmacy for pick up on Sunday 03/29/22  WOUND CARE: Reinforce your surgical dressing as needed. You may remove the ace wrap and gauze on post-op day #3 (Saturday 03/28/22). Incisions can be left open to air if there is no drainage. If incision continues to have drainage, follow wound care instructions below. Okay to shower if no drainage from incisions.  DVT/PE prophylaxis: Continue Lovenox until 03/26/22 then you may stop all anticoagulation  Diet: as you were eating previously.  Can use over the counter stool softeners and bowel preparations, such as Miralax, to help with bowel movements.  Narcotics can be constipating.  Be sure to drink plenty of fluids  PAIN MEDICATION USE AND EXPECTATIONS  You have likely been given narcotic medications to help control your pain.  After a traumatic event that results in an fracture (broken bone) with or without surgery, it is ok to use narcotic pain medications to help control one's pain.  We understand that everyone responds to pain differently and each individual patient will be evaluated on a regular basis for the continued need for narcotic medications. Ideally, narcotic medication use should last no more than 6-8 weeks (coinciding with fracture healing).   As a patient it is your responsibility as well to monitor narcotic medication use and report the amount and frequency you use these medications when you come to your office visit.   We would also advise that if you are using narcotic medications, you should take a dose prior to therapy  to maximize you participation.  IF YOU ARE ON NARCOTIC MEDICATIONS IT IS NOT PERMISSIBLE TO OPERATE A MOTOR VEHICLE (MOTORCYCLE/CAR/TRUCK/MOPED) OR HEAVY MACHINERY DO NOT MIX NARCOTICS WITH OTHER CNS (CENTRAL NERVOUS SYSTEM) DEPRESSANTS SUCH AS ALCOHOL   STOP SMOKING OR USING NICOTINE PRODUCTS!!!!  As discussed nicotine severely impairs your body's ability to heal surgical and traumatic wounds but also impairs bone healing.  Wounds and bone heal by forming microscopic blood vessels (angiogenesis) and nicotine is a vasoconstrictor (essentially, shrinks blood vessels).  Therefore, if vasoconstriction occurs to these microscopic blood vessels they essentially disappear and are unable to deliver necessary nutrients to the healing tissue.  This is one modifiable factor that you can do to dramatically increase your chances of healing your injury.    (This means no smoking, no nicotine gum, patches, etc)  DO NOT USE NONSTEROIDAL ANTI-INFLAMMATORY DRUGS (NSAID'S)  Using products such as Advil (ibuprofen), Aleve (naproxen), Motrin (ibuprofen) for additional pain control during fracture healing can delay and/or prevent the healing response.  If you would like to take over the counter (OTC) medication, Tylenol (acetaminophen) is ok.  However, some narcotic medications that are given for pain control contain acetaminophen as well. Therefore, you should not exceed more than 4000 mg of tylenol in a day if you do not have liver disease.  Also note that there are may OTC medicines, such as cold medicines and allergy medicines that my contain tylenol as well.  If you have any questions about medications and/or interactions please ask your doctor/PA or your pharmacist.      ICE AND ELEVATE INJURED/OPERATIVE EXTREMITY  Using ice and elevating the injured extremity above your heart can help with swelling and pain control.  Icing in a pulsatile fashion, such as 20 minutes on and 20 minutes off, can be followed.    Do not  place ice directly on skin. Make sure there is a barrier between to skin and the ice pack.    Using frozen items such as frozen peas works well as the conform nicely to the are that needs to be iced.  USE AN ACE WRAP OR TED HOSE FOR SWELLING CONTROL  In addition to icing and elevation, Ace wraps or TED hose are used to help limit and resolve swelling.  It is recommended to use Ace wraps or TED hose until you are informed to stop.    When using Ace Wraps start the wrapping distally (farthest away from the body) and wrap proximally (closer to the body)   Example: If you had surgery on your leg or thing and you do not have a splint on, start the ace wrap at the toes and work your way up to the thigh        If you had surgery on your upper extremity and do not have a splint on, start the ace wrap at your fingers and work your way up to the upper arm   CALL THE OFFICE WITH ANY QUESTIONS OR CONCERNS: [phone]   VISIT OUR WEBSITE FOR ADDITIONAL INFORMATION: [email or web address]    Discharge Wound Care Instructions  Do NOT apply any ointments, solutions or lotions to pin sites or surgical wounds.  These prevent needed drainage and even though solutions like hydrogen peroxide kill bacteria, they also damage cells lining the pin sites that help fight infection.  Applying lotions or ointments can keep the wounds moist and can cause them to breakdown and open up as well. This can increase the risk for infection. When in doubt call the office.  If any drainage is noted, use one layer of adaptic or Mepitel, then gauze, Kerlix, and an ace wrap. - These dressing supplies should be available at local medical supply stores [facility], [facility], etc) as well as Insurance claims handler ([facility], [facility], [facility], etc)  Once the incision is completely dry and without drainage, it may be left open to air out.  Showering may begin 36-48 hours later.  Cleaning gently with soap and water.

## 2022-03-25 NOTE — Telephone Encounter (Signed)
Thanks for the update Meg!

## 2022-03-25 NOTE — Anesthesia Procedure Notes (Signed)
Procedure Name: LMA Insertion Date/Time: 03/25/2022 11:52 AM  Performed by: Lonia Mad, CRNAPre-anesthesia Checklist: Patient identified, Emergency Drugs available, Suction available and Patient being monitored Patient Re-evaluated:Patient Re-evaluated prior to induction Oxygen Delivery Method: Circle System Utilized Preoxygenation: Pre-oxygenation with 100% oxygen Induction Type: IV induction Ventilation: Mask ventilation without difficulty LMA: LMA inserted LMA Size: 4.0 Number of attempts: 1 Airway Equipment and Method: Bite block Placement Confirmation: positive ETCO2 Tube secured with: Tape Dental Injury: Teeth and Oropharynx as per pre-operative assessment

## 2022-03-25 NOTE — Transfer of Care (Signed)
Immediate Anesthesia Transfer of Care Note  Patient: TASNIA SPEGAL  Procedure(s) Performed: REMOVAL EXTERNAL FIXATION LEG (Left)  Patient Location: PACU  Anesthesia Type:General  Level of Consciousness: drowsy and responds to stimulation  Airway & Oxygen Therapy: Patient Spontanous Breathing and Patient connected to face mask oxygen  Post-op Assessment: Report given to RN and Post -op Vital signs reviewed and stable  Post vital signs: Reviewed and stable  Last Vitals:  Vitals Value Taken Time  BP 103/73 03/25/22 1216  Temp    Pulse 80 03/25/22 1219  Resp 16 03/25/22 1219  SpO2 98 % 03/25/22 1219  Vitals shown include unvalidated device data.  Last Pain:  Vitals:   03/25/22 0904  PainSc: 10-Worst pain ever         Complications: No notable events documented.

## 2022-03-25 NOTE — Telephone Encounter (Signed)
Patient had hardware removed but she was DC same day of surgery before I could see her  If she still has PICC I would like to extend her IV antibiotics for another 6 weeks  If not we can do pills alone She also needs a followup with me

## 2022-03-25 NOTE — Interval H&P Note (Signed)
History and Physical Interval Note:  03/25/2022 11:12 AM  Kerry Young  has presented today for surgery, with the diagnosis of Left knee septic arthritis.  The various methods of treatment have been discussed with the patient and family. After consideration of risks, benefits and other options for treatment, the patient has consented to  Procedure(s): REMOVAL EXTERNAL FIXATION LEG (Left) as a surgical intervention.  The patient's history has been reviewed, patient examined, no change in status, stable for surgery.  I have reviewed the patient's chart and labs.  Questions were answered to the patient's satisfaction.     Caryn Bee P Paizleigh Wilds

## 2022-03-25 NOTE — Telephone Encounter (Signed)
Orders to extend IV antibiotics 6 weeks sent to Jeri Modena, RN with [facility].   Patient scheduled for hardware removal today, Dr. Daiva Eves to try and see her before her discharge.   Sandie Ano, RN

## 2022-03-25 NOTE — Consult Note (Signed)
Date of Admission:  03/25/2022          Reason for Consult:  Hardware associated osteomyelitis and septic arthritis   Referring Provider: Katha Hamming, MD   Assessment:  Left bicondylar tibial plateau fracture status post open reduction internal fixation with subsequent fall and development of Infected hardware involving patellar tendon medial proximal tibia status post irrigation and debridement  Septic knee status post I&D Patient now with increased callus over fracture to proceed with removal of external fixator Plan:  Now that hardware has been removed I would like to extend her parenteral antibiotics for an additional 6 weeks if she is agreeable which she is.  Diagnosis: Complicating wound infection, to myelitis septic arthritis  Culture Result: MRSA  Allergies  Allergen Reactions   Amitriptyline Other (See Comments)    Cold sores on mouth, blister on tongue. No facial swelling    Celexa [Citalopram] Nausea And Vomiting    Took it 2 days. Had n/v and migraine   Methocarbamol Nausea Only   Naproxen Nausea Only   Other Nausea And Vomiting    ALL ANTIBIOTICS   Zofran [Ondansetron] Other (See Comments)    headache    OPAT Orders Discharge antibiotics to be given via PICC line Discharge antibiotics: Daptomycin  Duration: 6 weeks End Date: October 17th, 2023  [place] Per Protocol:  Home health RN for IV administration and teaching; PICC line care and labs.    Labs weekly while on IV antibiotics: _x_ CBC with differential _x_ BMP  x__ CRP _x_ ESR  _x_ CK  __ Please pull PIC at completion of IV antibiotics __ Please leave PIC in place until doctor has seen patient or been notified  Fax weekly labs to [phone]  Clinic Follow Up Appt:    Kerry Young has an [facility] appointment on 04/09/2022 '@1130'  AM with Dr. Tommy Medal  The [facility] is located in the [facility] at  [address] in  [place].  [address], which is located to the left of the elevators.  Phone: [phone]  Fax: [phone]  [email or web address]   Active Problems:   * No active hospital problems. *   Scheduled Meds:  fentaNYL       Continuous Infusions:  lactated ringers 10 mL/hr at 03/25/22 0925   PRN Meds:.fentaNYL  HPI: Kerry Young is a 56 y.o. female  female who had a left bicondylar tibial plateau fracture underwent open reduction internal fixation on January 28, 2022.  The week afterwards she fell from her wheelchair to the ground and had persistent drainage from her wound.  She sought out care in the ER but says she was just given a pill and sent back home.  She ultimately came back to the ER yet again with increasing pain and instability on plain films as well as CT scan.  She was seen by Dr. Doreatha Martin who brought her to the operating room she had received antecedent antibiotics but he was able to take cultures in the operating room.  He performed I&D of the left septic knee as well as I&D of a complicated left postoperative infection around hardware near fracture and patellar tendon that extended on the medial side of the proximal tibia her left knee instability was treated with closed reduction and splinting.  She does still have hardware.  Cultures yielded MRSA and I initiated her on daptomycin my partner Dr. Graylon Good added rifampin but  the patient says she was never given   any antibiotics of the pharmacy.  She is now having removal of her hardware and like to extend her antibiotics for additional 6 weeks with a short cure of bone  I spent 82 minutes with the patient including than 50% of the time in face to face counseling of the patient during her septic arthritis hardware complicating osteomyelitis,  along with review of medical records in preparation for the visit and during the visit and in coordination of her care.   Review of Systems: Review of Systems  Constitutional:   Negative for chills, diaphoresis, fever, malaise/fatigue and weight loss.  HENT:  Negative for congestion, hearing loss, sore throat and tinnitus.   Eyes:  Negative for blurred vision and double vision.  Respiratory:  Negative for cough, sputum production, shortness of breath and wheezing.   Cardiovascular:  Negative for chest pain, palpitations and leg swelling.  Gastrointestinal:  Negative for abdominal pain, blood in stool, constipation, diarrhea, heartburn, melena, nausea and vomiting.  Genitourinary:  Negative for dysuria, flank pain and hematuria.  Musculoskeletal:  Positive for joint pain. Negative for back pain, falls and myalgias.  Skin:  Negative for itching and rash.  Neurological:  Negative for dizziness, sensory change, focal weakness, loss of consciousness, weakness and headaches.  Endo/Heme/Allergies:  Does not bruise/bleed easily.  Psychiatric/Behavioral:  Negative for depression, memory loss and suicidal ideas. The patient is not nervous/anxious.     Past Medical History:  Diagnosis Date   Anxiety    Arthritis    Migraines    PONV (postoperative nausea and vomiting)    PTSD (post-traumatic stress disorder)    Sciatica    UTI (urinary tract infection)     Social History   Tobacco Use   Smoking status: Every Day    Packs/day: 0.50    Years: 25.00    Total pack years: 12.50    Types: Cigarettes    Last attempt to quit: 07/11/2018    Years since quitting: 3.7   Smokeless tobacco: Never  Vaping Use   Vaping Use: Never used  Substance Use Topics   Alcohol use: Not Currently    Comment: 1 per year maybe.   Drug use: No    Family History  Problem Relation Age of Onset   Anxiety disorder Mother    Hypertension Mother    Kidney failure Mother    Heart attack Paternal Grandfather    Healthy Daughter    Allergies  Allergen Reactions   Amitriptyline Other (See Comments)    Cold sores on mouth, blister on tongue. No facial swelling    Celexa [Citalopram]  Nausea And Vomiting    Took it 2 days. Had n/v and migraine   Methocarbamol Nausea Only   Naproxen Nausea Only   Other Nausea And Vomiting    ALL ANTIBIOTICS   Zofran [Ondansetron] Other (See Comments)    headache    OBJECTIVE: Blood pressure 111/72, pulse 82, temperature 98 F (36.7 C), resp. rate 18, height '5\' 2"'  (1.575 m), weight 90.7 kg, SpO2 96 %.  Physical Exam Constitutional:      General: She is not in acute distress.    Appearance: Normal appearance. She is well-developed. She is not ill-appearing or diaphoretic.  HENT:     Head: Normocephalic and atraumatic.     Right Ear: Hearing and external ear normal.     Left Ear: Hearing and external ear normal.     Nose: No nasal deformity  or rhinorrhea.  Eyes:     General: No scleral icterus.    Conjunctiva/sclera: Conjunctivae normal.     Right eye: Right conjunctiva is not injected.     Left eye: Left conjunctiva is not injected.     Pupils: Pupils are equal, round, and reactive to light.  Neck:     Vascular: No JVD.  Cardiovascular:     Rate and Rhythm: Normal rate and regular rhythm.     Heart sounds: Normal heart sounds, S1 normal and S2 normal. No murmur heard.    No friction rub.  Abdominal:     General: Bowel sounds are normal. There is no distension.     Palpations: Abdomen is soft.     Tenderness: There is no abdominal tenderness.  Musculoskeletal:     Right shoulder: Normal.     Left shoulder: Normal.     Cervical back: Normal range of motion and neck supple.     Right hip: Normal.     Left hip: Normal.     Right knee: Normal.     Left knee: Normal.  Lymphadenopathy:     Head:     Right side of head: No submandibular, preauricular or posterior auricular adenopathy.     Left side of head: No submandibular, preauricular or posterior auricular adenopathy.     Cervical: No cervical adenopathy.     Right cervical: No superficial or deep cervical adenopathy.    Left cervical: No superficial or deep cervical  adenopathy.  Skin:    General: Skin is warm and dry.     Coloration: Skin is not pale.     Findings: No abrasion, bruising, ecchymosis, erythema, lesion or rash.     Nails: There is no clubbing.  Neurological:     Mental Status: She is alert and oriented to person, place, and time.     Sensory: No sensory deficit.     Coordination: Coordination normal.  Psychiatric:        Attention and Perception: She is attentive.        Mood and Affect: Mood normal.        Speech: Speech normal.        Behavior: Behavior normal. Behavior is cooperative.        Thought Content: Thought content normal.        Judgment: Judgment normal.      Lab Results Lab Results  Component Value Date   WBC 8.5 03/25/2022   HGB 12.0 03/25/2022   HCT 40.2 03/25/2022   MCV 89.7 03/25/2022   PLT 330 03/25/2022    Lab Results  Component Value Date   CREATININE 0.67 03/25/2022   BUN 8 03/25/2022   NA 140 03/25/2022   K 3.8 03/25/2022   CL 108 03/25/2022   CO2 23 03/25/2022    Lab Results  Component Value Date   ALT 97 (H) 02/20/2022   AST 90 (H) 02/20/2022   ALKPHOS 179 (H) 02/20/2022   BILITOT 0.3 02/20/2022     Microbiology: Recent Results (from the past 240 hour(s))  Surgical pcr screen     Status: None   Collection Time: 03/25/22  8:52 AM   Specimen: Nasal Mucosa; Nasal Swab  Result Value Ref Range Status   MRSA, PCR NEGATIVE NEGATIVE Final   Staphylococcus aureus NEGATIVE NEGATIVE Final    Comment: (NOTE) The Xpert SA Assay (FDA approved for NASAL specimens in patients 47 years of age and older), is one component of a comprehensive surveillance  program. It is not intended to diagnose infection nor to guide or monitor treatment. Performed at [facility], [place] [address] [place], [place] [address]     Alcide Evener, [facility] [phone] pager  03/25/2022, 11:01 AM

## 2022-03-26 ENCOUNTER — Encounter (HOSPITAL_COMMUNITY): Payer: Self-pay | Admitting: Student

## 2022-03-26 NOTE — Anesthesia Postprocedure Evaluation (Signed)
Anesthesia Post Note  Patient: [name]  Procedure(s) Performed: REMOVAL EXTERNAL FIXATION LEG (Left)     Patient location during evaluation: PACU Anesthesia Type: General Level of consciousness: sedated and patient cooperative Pain management: pain level controlled Vital Signs Assessment: post-procedure vital signs reviewed and stable Respiratory status: spontaneous breathing Cardiovascular status: stable Anesthetic complications: no   No notable events documented.  Last Vitals:  Vitals:   03/25/22 1300 03/25/22 1315  BP: 102/66 99/72  Pulse: 72 81  Resp: 14 14  Temp:  (!) 36.3 C  SpO2: 92% 95%    Last Pain:  Vitals:   03/25/22 1315  PainSc: 6                  Sajad Glander [employer]

## 2022-03-27 ENCOUNTER — Other Ambulatory Visit: Payer: Self-pay

## 2022-03-27 MED ORDER — ALPRAZOLAM 1 MG PO TABS: 1.0000 mg | ORAL_TABLET | Freq: Three times a day (TID) | ORAL | 0 refills | Status: DC | PRN

## 2022-03-27 NOTE — Telephone Encounter (Signed)
Pended.

## 2022-04-09 ENCOUNTER — Telehealth: Payer: Medicaid Other | Admitting: Infectious Disease

## 2022-04-09 ENCOUNTER — Other Ambulatory Visit: Payer: Self-pay

## 2022-04-09 ENCOUNTER — Telehealth: Payer: Self-pay

## 2022-04-09 NOTE — Telephone Encounter (Signed)
Patient called, reports that there was blood in her PICC line and dripping out of the lumen. The line was not clamped.   Walked her through the steps of cleaning the lumen and flushing the line with normal saline and heparin lock and then clamping the line to prevent backflow of blood.   She reports that the line is now clear. Emphasized importance of making sure the line is clamped when not infusing. Patient verbalized understanding and has no further questions.   Beryle Flock, RN

## 2022-04-20 ENCOUNTER — Other Ambulatory Visit: Payer: Self-pay

## 2022-04-20 ENCOUNTER — Telehealth: Payer: Self-pay | Admitting: Physician Assistant

## 2022-04-20 MED ORDER — QUETIAPINE FUMARATE ER 150 MG PO TB24: 150.0000 mg | ORAL_TABLET | Freq: Every day | ORAL | 0 refills | Status: DC

## 2022-04-20 NOTE — Telephone Encounter (Signed)
Filled 9/10 due 10/9

## 2022-04-20 NOTE — Telephone Encounter (Signed)
Patient called with refill request for Seroquel XR 150 and Xanax 1mg . Ph: [phone] Appt 1/18 Pharmacy [place], [place]

## 2022-04-27 ENCOUNTER — Telehealth: Payer: Medicaid Other | Admitting: Infectious Disease

## 2022-04-27 ENCOUNTER — Other Ambulatory Visit: Payer: Self-pay

## 2022-04-27 NOTE — Telephone Encounter (Signed)
Pt called requesting RF Alprazolam. Due to fill today. [address]

## 2022-04-27 NOTE — Telephone Encounter (Signed)
Attempted to call patient multiple times to begin Mychart visit today. Not able to reach her at this time. Left voicemail requesting call back to reschedule missed visit. Will need to reschedule for in person appointment to discuss antibiotic plan.  Mychart message sent. Leatrice Jewels, RMA

## 2022-04-28 ENCOUNTER — Other Ambulatory Visit: Payer: Self-pay

## 2022-04-28 MED ORDER — ALPRAZOLAM 1 MG PO TABS: 1.0000 mg | ORAL_TABLET | Freq: Three times a day (TID) | ORAL | 0 refills | Status: DC | PRN

## 2022-04-28 NOTE — Telephone Encounter (Signed)
Pended to gina 

## 2022-04-28 NOTE — Progress Notes (Signed)
Patient did not pick up phone or get onto video call

## 2022-04-29 ENCOUNTER — Ambulatory Visit: Payer: Medicaid Other | Admitting: Physician Assistant

## 2022-05-06 ENCOUNTER — Other Ambulatory Visit: Payer: Self-pay

## 2022-05-06 ENCOUNTER — Telehealth: Payer: Self-pay

## 2022-05-06 ENCOUNTER — Telehealth (INDEPENDENT_AMBULATORY_CARE_PROVIDER_SITE_OTHER): Payer: Medicaid Other | Admitting: Infectious Disease

## 2022-05-06 DIAGNOSIS — M00069 Staphylococcal arthritis, unspecified knee: Secondary | ICD-10-CM | POA: Diagnosis not present

## 2022-05-06 DIAGNOSIS — T847XXD Infection and inflammatory reaction due to other internal orthopedic prosthetic devices, implants and grafts, subsequent encounter: Secondary | ICD-10-CM

## 2022-05-06 DIAGNOSIS — A4902 Methicillin resistant Staphylococcus aureus infection, unspecified site: Secondary | ICD-10-CM

## 2022-05-06 DIAGNOSIS — M86662 Other chronic osteomyelitis, left tibia and fibula: Secondary | ICD-10-CM | POA: Diagnosis not present

## 2022-05-06 MED ORDER — DOXYCYCLINE HYCLATE 100 MG PO TABS: 100.0000 mg | ORAL_TABLET | Freq: Two times a day (BID) | ORAL | 8 refills | Status: DC

## 2022-05-06 NOTE — Telephone Encounter (Signed)
Per Dr Tommy Medal:  Patient may have picc pulled after completion of last dose of IV Abx today,    I have alerted HH , [facility], and sent copy to [facility] for [facility].   End date:  05/06/2022  Will start oral abx and follow up in one month.

## 2022-05-06 NOTE — Telephone Encounter (Signed)
Thanks Jamie!

## 2022-05-06 NOTE — Telephone Encounter (Signed)
Apolonio Schneiders with [facility] called requesting pull PICC orders, advised that orders were sent to [facility] by CMA, but gave verbal orders per Dr. Tommy Medal to pull PICC.  Beryle Flock, RN

## 2022-05-06 NOTE — Progress Notes (Signed)
Virtual Visit via Telephone Note  I connected with Kerry Young on 05/06/22 at  8:45 AM EDT by telephone and verified that I am speaking with the correct person using two identifiers.  Location: Patient: Home Provider: [facility]   I discussed the limitations, risks, security and privacy concerns of performing an evaluation and management service by telephone and the availability of in person appointments. I also discussed with the patient that there may be a patient responsible charge related to this service. The patient expressed understanding and agreed to proceed.   History of Present Illness:  Kerry Young is a 56 y.o. female  female who had a left bicondylar tibial plateau fracture underwent open reduction internal fixation on January 28, 2022.  The week afterwards she fell from her wheelchair to the ground and had persistent drainage from her wound.  She sought out care in the ER but says she was just given a pill and sent back home.  She ultimately came back to the ER yet again with increasing pain and instability on plain films as well as CT scan.  She was seen by Dr. [name] who brought her to the operating room she had received antecedent antibiotics but he was able to take cultures in the operating room.  He performed I&D of the left septic knee as well as I&D of a complicated left postoperative infection around hardware near fracture and patellar tendon that extended on the medial side of the proximal tibia her left knee instability was treated with closed reduction and splinting.  She does still have hardware.  Cultures yielded MRSA and I initiated her on daptomycin my partner Dr. [name] added rifampin but the patient says she was never given   any antibiotics of the pharmacy.  I saw her prior to surgery where she was having hardware removed.  I was under the mistaken impression that all of the hardware that was involved with her infection was being removed when in fact only some of it was being  removed we set her up with daptomycin to be continued by my note through yesterday but she says she is being treated it with a stop date of next week.  We had several phone visit scheduled which did not work out and I finally was able to connect to her over the phone today.  Given that her hardware is not entirely removed I think we need to transition her over to chronic doxycycline.  She says he had trouble in the past tolerating different antibiotics with nausea this is the case with doxycycline we will treat her with some Phenergan with caution about using motor vehicle accidents she apparently does not do well with Zofran.  She is still not able to ambulate at all.  She does have an appointment coming up with Dr. [name] and she is fearful that she will need further surgery with him which she would like to avoid.  12 point review of systems as above otherwise negative   Observations/Objective: Kerry Young sounded to be doing relatively well over the phone though it is disconcerting that she remains unable to walk.  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    Follow Up Instructions:    I discussed the assessment and treatment  plan with the patient. The patient was provided an opportunity to ask questions and all were answered. The patient agreed with the plan and demonstrated an understanding of the instructions.   The patient was advised to call back or seek an in-person evaluation if the symptoms worsen or if the condition fails to improve as anticipated.  I provided 22 minutes of non-face-to-face time during this encounter.   Alcide Evener, MD

## 2022-05-11 ENCOUNTER — Other Ambulatory Visit: Payer: Self-pay | Admitting: Infectious Disease

## 2022-05-11 ENCOUNTER — Other Ambulatory Visit: Payer: Self-pay

## 2022-05-11 MED ORDER — SULFAMETHOXAZOLE-TRIMETHOPRIM 800-160 MG PO TABS: 1.0000 | ORAL_TABLET | Freq: Two times a day (BID) | ORAL | 5 refills | Status: DC

## 2022-05-11 MED ORDER — PROMETHAZINE HCL 25 MG PO TABS: 25.0000 mg | ORAL_TABLET | Freq: Four times a day (QID) | ORAL | 0 refills | Status: DC | PRN

## 2022-05-11 NOTE — Telephone Encounter (Signed)
Patient called complaining Doxy is causing vomiting with in 30 min of taking each dose. Advised per Dr Tommy Medal okay to STOP Doxy and hydrate through the week. Patient will START Bactrim Friday when able to pick up. Dr Tommy Medal sent in Bactrim and Phenergan to [facility].   Will come in 05/22/22 for BMP  as Lab only visit and patient agreed to arrange for her ride now for that lab visit.  Discussed need for hydrating prior to start of double dosed Bactrim per provider and patient understood.

## 2022-05-11 NOTE — Progress Notes (Signed)
p 

## 2022-05-14 ENCOUNTER — Other Ambulatory Visit: Payer: Self-pay

## 2022-05-14 DIAGNOSIS — M86662 Other chronic osteomyelitis, left tibia and fibula: Secondary | ICD-10-CM

## 2022-05-19 ENCOUNTER — Telehealth: Payer: Self-pay

## 2022-05-19 NOTE — Telephone Encounter (Signed)
Requesting [facility] orders for labs due to not being able to get down her steps and not having ride in stretcher. [place] info to see if we need to cancel lab only visit for BMP she also can only do phone visits since shut in and non mobile. Applied for a program with insurance that helps get her out of upper level home and in transport. Will change next visit to phone visit. Will call her about plan for labs after decided.   Per DR Tommy Medal we will cancel labs for now and discuss at phone visit later this month.

## 2022-05-22 ENCOUNTER — Other Ambulatory Visit: Payer: Medicaid Other

## 2022-06-02 ENCOUNTER — Telehealth: Payer: Self-pay

## 2022-06-02 DIAGNOSIS — R110 Nausea: Secondary | ICD-10-CM

## 2022-06-02 DIAGNOSIS — M86662 Other chronic osteomyelitis, left tibia and fibula: Secondary | ICD-10-CM

## 2022-06-02 NOTE — Telephone Encounter (Signed)
Patient called, says she is not tolerating the Bactrim.   She would like to go back to doxycycline and get a refill of the phenergan as it worked to curb the nausea while she was on doxy. Reports that it does not help with the nausea being induced by the bactrim. Will route to provider.   She is using [facility] on [facility] [employer].   Sandie Ano, RN

## 2022-06-03 MED ORDER — PROMETHAZINE HCL 25 MG PO TABS: 25.0000 mg | ORAL_TABLET | Freq: Four times a day (QID) | ORAL | 0 refills | Status: DC | PRN

## 2022-06-03 MED ORDER — DOXYCYCLINE HYCLATE 100 MG PO TABS: 100.0000 mg | ORAL_TABLET | Freq: Two times a day (BID) | ORAL | 0 refills | Status: DC

## 2022-06-03 NOTE — Addendum Note (Signed)
Addended by: Linna Hoff D on: 06/03/2022 08:27 AM   Modules accepted: Orders

## 2022-06-03 NOTE — Telephone Encounter (Signed)
Discontinued Bactrim, refilled phenergan and sent in doxycycline per Dr. Daiva Eves.   Called patient to notify her of change in medications, no answer. Left HIPAA compliant voicemail requesting callback.   Sandie Ano, RN

## 2022-06-08 ENCOUNTER — Other Ambulatory Visit: Payer: Self-pay

## 2022-06-08 ENCOUNTER — Ambulatory Visit (INDEPENDENT_AMBULATORY_CARE_PROVIDER_SITE_OTHER): Payer: Medicaid Other | Admitting: Infectious Disease

## 2022-06-08 DIAGNOSIS — M00062 Staphylococcal arthritis, left knee: Secondary | ICD-10-CM

## 2022-06-08 DIAGNOSIS — A4902 Methicillin resistant Staphylococcus aureus infection, unspecified site: Secondary | ICD-10-CM | POA: Diagnosis not present

## 2022-06-08 DIAGNOSIS — M86662 Other chronic osteomyelitis, left tibia and fibula: Secondary | ICD-10-CM

## 2022-06-08 DIAGNOSIS — T847XXD Infection and inflammatory reaction due to other internal orthopedic prosthetic devices, implants and grafts, subsequent encounter: Secondary | ICD-10-CM

## 2022-06-08 DIAGNOSIS — M00069 Staphylococcal arthritis, unspecified knee: Secondary | ICD-10-CM

## 2022-06-08 DIAGNOSIS — R110 Nausea: Secondary | ICD-10-CM | POA: Diagnosis not present

## 2022-06-08 DIAGNOSIS — M8660 Other chronic osteomyelitis, unspecified site: Secondary | ICD-10-CM

## 2022-06-08 MED ORDER — PROMETHAZINE HCL 25 MG PO TABS: 25.0000 mg | ORAL_TABLET | Freq: Four times a day (QID) | ORAL | 3 refills | Status: DC | PRN

## 2022-06-08 NOTE — Progress Notes (Signed)
Virtual Visit via Telephone Note  I connected with Kerry Young on 06/08/22 at  1:45 PM EST by telephone and verified that I am speaking with the correct person using two identifiers.  Location: Patient: Home Provider: [facility]   I discussed the limitations, risks, security and privacy concerns of performing an evaluation and management service by telephone and the availability of in person appointments. I also discussed with the patient that there may be a patient responsible charge related to this service. The patient expressed understanding and agreed to proceed.   History of Present Illness:  Kerry Young is a 56 y.o. female  female who had a left bicondylar tibial plateau fracture underwent open reduction internal fixation on January 28, 2022.  The week afterwards she fell from her wheelchair to the ground and had persistent drainage from her wound.  She sought out care in the ER but says she was just given a pill and sent back home.  She ultimately came back to the ER yet again with increasing pain and instability on plain films as well as CT scan.  She was seen by Dr. [name] who brought her to the operating room she had received antecedent antibiotics but he was able to take cultures in the operating room.  He performed I&D of the left septic knee as well as I&D of a complicated left postoperative infection around hardware near fracture and patellar tendon that extended on the medial side of the proximal tibia her left knee instability was treated with closed reduction and splinting.  She does still have hardware.  Cultures yielded MRSA and I initiated her on daptomycin my partner Dr. [name] added rifampin but the patient says she was never given   any antibiotics of the pharmacy.  I saw her prior to surgery where she was having hardware removed.  I was under the mistaken impression that all of the hardware that was involved with her infection was being removed when in fact only some of it was being  removed we set her up with daptomycin to be continued by my note through yesterday but she says she is being treated it with a stop date of next week.  We had several phone visit scheduled which did not work out and I finally was able to connect to her over the phone today.  Given that her hardware is not entirely removed I think we need to transition her over to chronic doxycycline.  She says he had trouble in the past tolerating different antibiotics with nausea this is the case with doxycycline we will treat her with some Phenergan with caution about using motor vehicle accidents she apparently does not do well with Zofran.  We had her switch over to oral doxycyline tolerating groups but she had trouble tolerating it due to nausea.  We then switched her to Bactrim which she tolerated even less well.  She has not taken doxycycline with Phenergan being taken 4 times a day to control her nausea.  She says her pain seems worse and she is going to see Dr. [name] tomorrow.      12 point review of systems as above otherwise negative   Observations/Objective:  I am concerned by her increasing pain.  She also is complaining of some leg cramps.   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  She is going to see Dr. [name] tomorrow.  Would  recommend getting sed rate CRP CMP and CBC with differential I scheduled to see me in January now.    Follow Up Instructions:    I discussed the assessment and treatment plan with the patient. The patient was provided an opportunity to ask questions and all were answered. The patient agreed with the plan and demonstrated an understanding of the instructions.   The patient was advised to call back or seek an in-person evaluation if the symptoms worsen or if the condition fails to improve as anticipated.  I provided 22 minutes of non-face-to-face  time during this encounter.   Acey Lav, MD

## 2022-07-16 ENCOUNTER — Emergency Department (HOSPITAL_COMMUNITY): Payer: Medicaid Other

## 2022-07-16 ENCOUNTER — Encounter (HOSPITAL_COMMUNITY): Payer: Self-pay | Admitting: *Deleted

## 2022-07-16 ENCOUNTER — Observation Stay (HOSPITAL_COMMUNITY)
Admission: EM | Admit: 2022-07-16 | Discharge: 2022-07-23 | Disposition: A | Discharge status: Other Acute Inpatient Hospital | Payer: Medicaid Other | Attending: Internal Medicine | Admitting: Internal Medicine

## 2022-07-16 ENCOUNTER — Other Ambulatory Visit: Payer: Self-pay

## 2022-07-16 DIAGNOSIS — Z1152 Encounter for screening for COVID-19: Secondary | ICD-10-CM | POA: Diagnosis not present

## 2022-07-16 DIAGNOSIS — G894 Chronic pain syndrome: Secondary | ICD-10-CM | POA: Diagnosis present

## 2022-07-16 DIAGNOSIS — R4182 Altered mental status, unspecified: Secondary | ICD-10-CM | POA: Diagnosis present

## 2022-07-16 DIAGNOSIS — M8660 Other chronic osteomyelitis, unspecified site: Secondary | ICD-10-CM | POA: Diagnosis present

## 2022-07-16 DIAGNOSIS — T50904A Poisoning by unspecified drugs, medicaments and biological substances, undetermined, initial encounter: Secondary | ICD-10-CM | POA: Diagnosis not present

## 2022-07-16 DIAGNOSIS — F17210 Nicotine dependence, cigarettes, uncomplicated: Secondary | ICD-10-CM | POA: Diagnosis not present

## 2022-07-16 DIAGNOSIS — F4310 Post-traumatic stress disorder, unspecified: Secondary | ICD-10-CM | POA: Diagnosis present

## 2022-07-16 DIAGNOSIS — E876 Hypokalemia: Secondary | ICD-10-CM | POA: Diagnosis not present

## 2022-07-16 DIAGNOSIS — M79605 Pain in left leg: Secondary | ICD-10-CM | POA: Diagnosis not present

## 2022-07-16 DIAGNOSIS — N63 Unspecified lump in unspecified breast: Secondary | ICD-10-CM | POA: Diagnosis present

## 2022-07-16 DIAGNOSIS — F411 Generalized anxiety disorder: Secondary | ICD-10-CM | POA: Diagnosis present

## 2022-07-16 LAB — COMPREHENSIVE METABOLIC PANEL
ALT: 17 U/L (ref 0–44)
AST: 19 U/L (ref 15–41)
Albumin: 3.6 g/dL (ref 3.5–5.0)
Alkaline Phosphatase: 66 U/L (ref 38–126)
Anion gap: 6 (ref 5–15)
BUN: 16 mg/dL (ref 6–20)
CO2: 23 mmol/L (ref 22–32)
Calcium: 9 mg/dL (ref 8.9–10.3)
Chloride: 108 mmol/L (ref 98–111)
Creatinine, Ser: 1 mg/dL (ref 0.44–1.00)
GFR, Estimated: >60 mL/min (ref >60)
Glucose, Bld: 97 mg/dL (ref 70–99)
Potassium: 3.3 mmol/L — ABNORMAL LOW (ref 3.5–5.1)
Sodium: 137 mmol/L (ref 135–145)
Total Bilirubin: 0.7 mg/dL (ref 0.3–1.2)
Total Protein: 7.2 g/dL (ref 6.5–8.1)

## 2022-07-16 LAB — RAPID URINE DRUG SCREEN, HOSP PERFORMED
Amphetamines: NOT DETECTED
Barbiturates: NOT DETECTED
Benzodiazepines: NOT DETECTED
Cocaine: NOT DETECTED
Opiates: POSITIVE — AB
Tetrahydrocannabinol: NOT DETECTED

## 2022-07-16 LAB — CBC WITH DIFFERENTIAL/PLATELET
Abs Immature Granulocytes: 0.07 10*3/uL (ref 0.00–0.07)
Basophils Absolute: 0.1 10*3/uL (ref 0.0–0.1)
Basophils Relative: 0 %
Eosinophils Absolute: 0.1 10*3/uL (ref 0.0–0.5)
Eosinophils Relative: 1 %
HCT: 39.2 % (ref 36.0–46.0)
Hemoglobin: 12.3 g/dL (ref 12.0–15.0)
Immature Granulocytes: 1 %
Lymphocytes Relative: 9 %
Lymphs Abs: 1.1 10*3/uL (ref 0.7–4.0)
MCH: 25.9 pg — ABNORMAL LOW (ref 26.0–34.0)
MCHC: 31.4 g/dL (ref 30.0–36.0)
MCV: 82.5 fL (ref 80.0–100.0)
Monocytes Absolute: 0.8 10*3/uL (ref 0.1–1.0)
Monocytes Relative: 6 %
Neutro Abs: 10.3 10*3/uL — ABNORMAL HIGH (ref 1.7–7.7)
Neutrophils Relative %: 83 %
Platelets: 207 10*3/uL (ref 150–400)
RBC: 4.75 MIL/uL (ref 3.87–5.11)
RDW: 18.4 % — ABNORMAL HIGH (ref 11.5–15.5)
WBC: 12.3 10*3/uL — ABNORMAL HIGH (ref 4.0–10.5)
nRBC: 0 % (ref 0.0–0.2)

## 2022-07-16 LAB — ETHANOL: Alcohol, Ethyl (B): <10 mg/dL (ref <10)

## 2022-07-16 LAB — SALICYLATE LEVEL: Salicylate Lvl: <7 mg/dL — ABNORMAL LOW (ref 7.0–30.0)

## 2022-07-16 LAB — MAGNESIUM: Magnesium: 2.1 mg/dL (ref 1.7–2.4)

## 2022-07-16 LAB — ACETAMINOPHEN LEVEL: Acetaminophen (Tylenol), Serum: <10 ug/mL — ABNORMAL LOW (ref 10–30)

## 2022-07-16 MED ORDER — ENOXAPARIN SODIUM 40 MG/0.4ML IJ SOSY
40.0000 mg | PREFILLED_SYRINGE | INTRAMUSCULAR | Status: DC
  Administered 2022-07-16 – 2022-07-18 (×2): 40 mg via SUBCUTANEOUS
  Filled 2022-07-16 (×5): qty 0.4

## 2022-07-16 MED ORDER — PROCHLORPERAZINE EDISYLATE 10 MG/2ML IJ SOLN
10.0000 mg | Freq: Four times a day (QID) | INTRAMUSCULAR | Status: DC | PRN
  Administered 2022-07-18: 10 mg via INTRAVENOUS
  Filled 2022-07-16: qty 2

## 2022-07-16 MED ORDER — ACETAMINOPHEN 325 MG PO TABS
650.0000 mg | ORAL_TABLET | Freq: Four times a day (QID) | ORAL | Status: DC | PRN
  Administered 2022-07-16 – 2022-07-17 (×3): 650 mg via ORAL
  Filled 2022-07-16 (×3): qty 2

## 2022-07-16 MED ORDER — POTASSIUM CHLORIDE CRYS ER 20 MEQ PO TBCR
40.0000 meq | EXTENDED_RELEASE_TABLET | Freq: Every day | ORAL | Status: DC
  Administered 2022-07-16 – 2022-07-17 (×2): 40 meq via ORAL
  Filled 2022-07-16 (×2): qty 2

## 2022-07-16 MED ORDER — LORAZEPAM 2 MG/ML IJ SOLN
0.5000 mg | Freq: Four times a day (QID) | INTRAMUSCULAR | Status: DC | PRN
  Administered 2022-07-16 – 2022-07-18 (×6): 0.5 mg via INTRAVENOUS
  Filled 2022-07-16 (×6): qty 1

## 2022-07-16 MED ORDER — ACETAMINOPHEN 650 MG RE SUPP: 650.0000 mg | Freq: Four times a day (QID) | RECTAL | Status: DC | PRN

## 2022-07-16 NOTE — ED Notes (Signed)
Patient is now more alert.  She is able to state her home address.  She denies any overdose of medications. She became upset demanding her phone.  Advised patient that she did not come with anything but her pajamas from her home.

## 2022-07-16 NOTE — ED Triage Notes (Signed)
Mom called EMS due to finding patient unresponsive.  She was sitting in front of her wheelchair sitting on her buttocks leaning forward.  Patient with recent RX for oxycodone on 12/26 with 7 tablets missing (based on amount RX and how many patient could have potentially taken if following directions).  She also had phenergan prescribed on 12/23 with 104 tablets missing (again based on taking medications correctly per directions).  Patient found to be somulent upon ems arrival.  CBG 117, BP 102/58. Patient responded to bumps in the road.  She responded to being transferred from stretcher to bed.  Patient airway is patent upon arrival. ERMD to bedside upon arrival.  Patient is noted to have some twitching while sleeping.  She has deformity post surgery to the left lower extremity.

## 2022-07-16 NOTE — ED Notes (Signed)
ED TO INPATIENT HANDOFF REPORT  ED Nurse Name and Phone #: Delice Bisonara, RN  S Name/Age/Gender Jennet MaduroKaren G Vanderveer 56 y.o. female Room/Bed: WA16/WA16  Code Status   Code Status: Full Code  Home/SNF/Other Home Patient oriented to: self, place, time, and situation Is this baseline? Yes   Triage Complete: Triage complete  Chief Complaint Medication overdose, undetermined intent, initial encounter [T50.904A]  Triage Note Mom called EMS due to finding patient unresponsive.  She was sitting in front of her wheelchair sitting on her buttocks leaning forward.  Patient with recent RX for oxycodone on 12/26 with 7 tablets missing (based on amount RX and how many patient could have potentially taken if following directions).  She also had phenergan prescribed on 12/23 with 104 tablets missing (again based on taking medications correctly per directions).  Patient found to be somulent upon ems arrival.  CBG 117, BP 102/58. Patient responded to bumps in the road.  She responded to being transferred from stretcher to bed.  Patient airway is patent upon arrival. ERMD to bedside upon arrival.  Patient is noted to have some twitching while sleeping.  She has deformity post surgery to the left lower extremity.     Allergies Allergies  Allergen Reactions   Amitriptyline Other (See Comments)    Cold sores on mouth, blister on tongue. No facial swelling    Celexa [Citalopram] Nausea And Vomiting    Took it 2 days. Had n/v and migraine   Methocarbamol Nausea Only   Naproxen Nausea Only   Other Nausea And Vomiting    ALL ANTIBIOTICS   Zofran [Ondansetron] Other (See Comments)    headache    Level of Care/Admitting Diagnosis ED Disposition     ED Disposition  Admit   Condition  --   Comment  [facility] [[id number]]  Level of Care: Telemetry [5]  Admit to tele based on following criteria: Monitor QTC interval  [facility] if  equivalent level of care is available:: Yes  Covid Evaluation: Asymptomatic - no recent exposure (last 10 days) testing not required  Diagnosis: Medication overdose, undetermined intent, initial encounter [[id number]][[id number]]  Admitting Physician: Briscoe DeutscherPYD, TIMOTHY S [[id number]]  Attending Physician: Briscoe DeutscherPYD, TIMOTHY S [[id number]]          B Medical/Surgery History Past Medical History:  Diagnosis Date   Anxiety    Arthritis    Migraines    PONV (postoperative nausea and vomiting)    PTSD (post-traumatic stress disorder)    Sciatica    UTI (urinary tract infection)    Past Surgical History:  Procedure Laterality Date   COLPOSCOPY N/A 10/26/2019   Procedure: COLPOSCOPY with cervical biopsy;  Surgeon: Myna Hidalgozan, Jennifer, DO;  Location: [facility];  Service: Gynecology;  Laterality: N/A;   EXTERNAL FIXATION LEG Left 02/18/2022   Procedure: EXTERNAL FIXATION LEG;  Surgeon: Roby LoftsHaddix, Kevin P, MD;  Location: [facility];  Service: Orthopedics;  Laterality: Left;   EXTERNAL FIXATION REMOVAL Left 03/25/2022   Procedure: REMOVAL EXTERNAL FIXATION LEG;  Surgeon: Roby LoftsHaddix, Kevin P, MD;  Location: [facility];  Service: Orthopedics;  Laterality: Left;   I & D EXTREMITY Left 02/13/2022   Procedure: IRRIGATION AND DEBRIDEMENT EXTREMITY;  Surgeon: Roby LoftsHaddix, Kevin P, MD;  Location: [facility];  Service: Orthopedics;  Laterality: Left;   ORIF HUMERUS FRACTURE Right 02/07/2021   Procedure: Arthroscopic repair rotator cuff and fixation of PROXIMAL HUMERUS FRACTURE;  Surgeon: Bjorn PippinVarkey, Dax T, MD;  Location: [facility];  Service: Orthopedics;  Laterality: Right;   ORIF TIBIA PLATEAU Left 01/28/2022   Procedure: OPEN REDUCTION INTERNAL FIXATION (ORIF) TIBIAL PLATEAU;  Surgeon: Roby Lofts, MD;  [facility];  Service: Orthopedics;  Laterality: Left;   ORIF ULNAR FRACTURE Left 02/18/2021   Procedure: OPEN REDUCTION INTERNAL FIXATION (ORIF) ULNAR FRACTURE;  Surgeon: Sheral Apley, MD;  [facility];  Service: Orthopedics;  Laterality:  Left;   TIBIA IM NAIL INSERTION Right 02/05/2021   Procedure: INTRAMEDULLARY (IM) NAIL TIBIAL;  Surgeon: Bjorn Pippin, MD;  [facility];  Service: Orthopedics;  Laterality: Right;   WISDOM TOOTH EXTRACTION       A IV Location/Drains/Wounds Patient Lines/Drains/Airways Status     Active Line/Drains/Airways     Name Placement date Placement time Site Days   Peripheral IV 07/16/22 20 G 1" Left Antecubital 07/16/22  0131  Antecubital  less than 1            Intake/Output Last 24 hours  Intake/Output Summary (Last 24 hours) at 07/16/2022 1225 Last data filed at 07/16/2022 0815 Gross per 24 hour  Intake --  Output 850 ml  Net -850 ml    Labs/Imaging Results for orders placed or performed during the hospital encounter of 07/16/22 (from the past 48 hour(s))  CBC with Differential     Status: Abnormal   Collection Time: 07/16/22  2:00 AM  Result Value Ref Range   WBC 12.3 (H) 4.0 - 10.5 K/uL   RBC 4.75 3.87 - 5.11 MIL/uL   Hemoglobin 12.3 12.0 - 15.0 g/dL   HCT 16.6 06.3 - 01.6 %   MCV 82.5 80.0 - 100.0 fL   MCH 25.9 (L) 26.0 - 34.0 pg   MCHC 31.4 30.0 - 36.0 g/dL   RDW 01.0 (H) 93.2 - 35.5 %   Platelets 207 150 - 400 K/uL   nRBC 0.0 0.0 - 0.2 %   Neutrophils Relative % 83 %   Neutro Abs 10.3 (H) 1.7 - 7.7 K/uL   Lymphocytes Relative 9 %   Lymphs Abs 1.1 0.7 - 4.0 K/uL   Monocytes Relative 6 %   Monocytes Absolute 0.8 0.1 - 1.0 K/uL   Eosinophils Relative 1 %   Eosinophils Absolute 0.1 0.0 - 0.5 K/uL   Basophils Relative 0 %   Basophils Absolute 0.1 0.0 - 0.1 K/uL   Immature Granulocytes 1 %   Abs Immature Granulocytes 0.07 0.00 - 0.07 K/uL    Comment: Performed at [facility], [address] [place], [place] [address]  Comprehensive metabolic panel     Status: Abnormal   Collection Time: 07/16/22  2:00 AM  Result Value Ref Range   Sodium 137 135 - 145 mmol/L   Potassium 3.3 (L) 3.5 - 5.1 mmol/L   Chloride 108 98 - 111 mmol/L   CO2 23  22 - 32 mmol/L   Glucose, Bld 97 70 - 99 mg/dL    Comment: Glucose reference range applies only to samples taken after fasting for at least 8 hours.   BUN 16 6 - 20 mg/dL   Creatinine, Ser 2.54 0.44 - 1.00 mg/dL   Calcium 9.0 8.9 - 27.0 mg/dL   Total Protein 7.2 6.5 - 8.1 g/dL   Albumin 3.6 3.5 - 5.0 g/dL   AST 19 15 - 41 U/L   ALT 17 0 - 44 U/L   Alkaline Phosphatase 66 38 - 126 U/L   Total Bilirubin 0.7 0.3 -  1.2 mg/dL   GFR, Estimated >70 >96 mL/min    Comment: (NOTE) Calculated using the CKD-EPI Creatinine Equation (2021)    Anion gap 6 5 - 15    Comment: Performed at [facility], [address], [place], [place] [address]  Acetaminophen level     Status: Abnormal   Collection Time: 07/16/22  2:00 AM  Result Value Ref Range   Acetaminophen (Tylenol), Serum <10 (L) 10 - 30 ug/mL    Comment: (NOTE) Therapeutic concentrations vary significantly. A range of 10-30 ug/mL  may be an effective concentration for many patients. However, some  are best treated at concentrations outside of this range. Acetaminophen concentrations >150 ug/mL at 4 hours after ingestion  and >50 ug/mL at 12 hours after ingestion are often associated with  toxic reactions.  Performed at [facility], [address], [place], [place] [address]   Salicylate level     Status: Abnormal   Collection Time: 07/16/22  2:00 AM  Result Value Ref Range   Salicylate Lvl <7.0 (L) 7.0 - 30.0 mg/dL    Comment: Performed at [facility], [address] [place], [place] [address]  Rapid urine drug screen (hospital performed)     Status: Abnormal   Collection Time: 07/16/22  2:00 AM  Result Value Ref Range   Opiates POSITIVE (A) NONE DETECTED   Cocaine NONE DETECTED NONE DETECTED   Benzodiazepines NONE DETECTED NONE DETECTED   Amphetamines NONE DETECTED NONE DETECTED   Tetrahydrocannabinol NONE DETECTED NONE DETECTED   Barbiturates NONE DETECTED NONE DETECTED     Comment: (NOTE) DRUG SCREEN FOR MEDICAL PURPOSES ONLY.  IF CONFIRMATION IS NEEDED FOR ANY PURPOSE, NOTIFY LAB WITHIN 5 DAYS.  LOWEST DETECTABLE LIMITS FOR URINE DRUG SCREEN Drug Class                     Cutoff (ng/mL) Amphetamine and metabolites    1000 Barbiturate and metabolites    200 Benzodiazepine                 200 Opiates and metabolites        300 Cocaine and metabolites        300 THC                            50 Performed at [facility], [address], [place], [place] [address]   Ethanol     Status: None   Collection Time: 07/16/22  2:00 AM  Result Value Ref Range   Alcohol, Ethyl (B) <10 <10 mg/dL    Comment: (NOTE) Lowest detectable limit for serum alcohol is 10 mg/dL.  For medical purposes only. Performed at [facility], [address], [place], [place] [address]   Magnesium     Status: None   Collection Time: 07/16/22  2:00 AM  Result Value Ref Range   Magnesium 2.1 1.7 - 2.4 mg/dL    Comment: Performed at [facility], [address] [place], [place] [address]   CT Head Wo Contrast  Result Date: 07/16/2022 CLINICAL DATA:  Initial evaluation for overdose, delirium. EXAM: CT HEAD WITHOUT CONTRAST TECHNIQUE: Contiguous axial images were obtained from the base of the skull through the vertex without intravenous contrast. RADIATION DOSE REDUCTION: This exam was performed according to the departmental dose-optimization program which includes automated exposure control, adjustment of the mA and/or kV according  to patient size and/or use of iterative reconstruction technique. COMPARISON:  Prior study from 02/04/2022. FINDINGS: Brain: Cerebral volume within normal limits for age. No evidence for acute intracranial hemorrhage. No findings to suggest acute large vessel territory infarct. No mass lesion, midline shift, or mass effect. Ventricles are normal in size without evidence for hydrocephalus. No  extra-axial fluid collection identified. Vascular: No hyperdense vessel identified. Skull: Scalp soft tissues demonstrate no acute abnormality. Calvarium intact. Sinuses/Orbits: Globes and orbital soft tissues within normal limits. Small mucous retention cyst within the right maxillary sinus. Paranasal sinuses are otherwise clear. No mastoid effusion. IMPRESSION: Negative head CT.  No acute intracranial abnormality. Electronically Signed   By: Rise Mu M.D.   On: 07/16/2022 02:59   DG Tibia/Fibula Left  Result Date: 07/16/2022 CLINICAL DATA:  Left lower extremity deformity. EXAM: LEFT TIBIA AND FIBULA - 2 VIEW COMPARISON:  Radiograph dated 02/13/2022 and fluoroscopic study dated 03/25/2022. FINDINGS: Status post prior internal fixation of comminuted and displaced tibial plateau fracture. There is apparent nonunion of the fracture fragments. The hardware is intact. No new fracture. The bones are osteopenic. There is narrowing of the medial and lateral compartments of the knee. The soft tissues are grossly unremarkable. IMPRESSION: Status post prior internal fixation of comminuted and displaced tibial plateau fracture. No new fracture. Electronically Signed   By: Elgie Collard M.D.   On: 07/16/2022 02:46    Pending Labs Unresulted Labs (From admission, onward)     Start     Ordered   07/17/22 0500  Comprehensive metabolic panel  Tomorrow morning,   R        07/16/22 0934   07/17/22 0500  CBC  Tomorrow morning,   R        07/16/22 0934   07/16/22 0935  HIV Antibody (routine testing w rflx)  (HIV Antibody (Routine testing w reflex) panel)  Once,   R        07/16/22 0934            Vitals/Pain Today's Vitals   07/16/22 0718 07/16/22 0857 07/16/22 1214 07/16/22 1221  BP:  101/75 90/78   Pulse: 84 86 91   Resp: 17 16 (!) 23   Temp:  98 F (36.7 C)  98.3 F (36.8 C)  TempSrc:  Oral  Oral  SpO2: 94% 96% 94%     Isolation Precautions No active  isolations  Medications Medications  enoxaparin (LOVENOX) injection 40 mg (40 mg Subcutaneous Given 07/16/22 0957)  acetaminophen (TYLENOL) tablet 650 mg (has no administration in time range)    Or  acetaminophen (TYLENOL) suppository 650 mg (has no administration in time range)  prochlorperazine (COMPAZINE) injection 10 mg (has no administration in time range)  potassium chloride SA (KLOR-CON M) CR tablet 40 mEq (40 mEq Oral Given 07/16/22 0956)  LORazepam (ATIVAN) injection 0.5 mg (has no administration in time range)    Mobility manual wheelchair High fall risk   Focused Assessments Cardiac Assessment Handoff:    Lab Results  Component Value Date   CKTOTAL 24 (L) 02/20/2022   No results found for: "DDIMER" Does the Patient currently have chest pain? No    R Recommendations: See Admitting Provider Note  Report given to:   Additional Notes:

## 2022-07-16 NOTE — H&P (Signed)
History and Physical    Patient: Kerry Young [id number] DOB: November 11, 1965 DOA: 07/16/2022 DOS: the patient was seen and examined on 07/16/2022 PCP: Sue Lush, PA-C  Patient coming from: Home  Chief Complaint:  Chief Complaint  Patient presents with   Drug Overdose   HPI: Kerry Young is a 56 y.o. female with medical history significant of chronic osteomyelitis on doxy; anxiety/PTSD. Presenting with altered mental status. History is from mother by phone. She says she noticed her daughter was disoriented 2 days ago. This seem to worsen yesterday. Late yesterday she was "talking out of head". She states that she the patient fell out of her wheelchair last night. Per the mother, the patient appear to have taken too much of her pain medicines. She has a history of drug abuse. The mother reports that she had a similar episode a couple of months. The mother was unable to get the patient up off the floor so she called for EMS. She denies any other aggravating or alleviating factors.   Review of Systems: As mentioned in the history of present illness. All other systems reviewed and are negative. Past Medical History:  Diagnosis Date   Anxiety    Arthritis    Migraines    PONV (postoperative nausea and vomiting)    PTSD (post-traumatic stress disorder)    Sciatica    UTI (urinary tract infection)    Past Surgical History:  Procedure Laterality Date   COLPOSCOPY N/A 10/26/2019   Procedure: COLPOSCOPY with cervical biopsy;  Surgeon: Janyth Pupa, DO;  Location: [place];  Service: Gynecology;  Laterality: N/A;   EXTERNAL FIXATION LEG Left 02/18/2022   Procedure: EXTERNAL FIXATION LEG;  Surgeon: Shona Needles, MD;  Location: [place];  Service: Orthopedics;  Laterality: Left;   EXTERNAL FIXATION REMOVAL Left 03/25/2022   Procedure: REMOVAL EXTERNAL FIXATION LEG;  Surgeon: Shona Needles, MD;  Location: [place];  Service: Orthopedics;  Laterality: Left;   I & D EXTREMITY  Left 02/13/2022   Procedure: IRRIGATION AND DEBRIDEMENT EXTREMITY;  Surgeon: Shona Needles, MD;  Location: [place];  Service: Orthopedics;  Laterality: Left;   ORIF HUMERUS FRACTURE Right 02/07/2021   Procedure: Arthroscopic repair rotator cuff and fixation of PROXIMAL HUMERUS FRACTURE;  Surgeon: Hiram Gash, MD;  Location: [place];  Service: Orthopedics;  Laterality: Right;   ORIF TIBIA PLATEAU Left 01/28/2022   Procedure: OPEN REDUCTION INTERNAL FIXATION (ORIF) TIBIAL PLATEAU;  Surgeon: Shona Needles, MD;  Location: [place];  Service: Orthopedics;  Laterality: Left;   ORIF ULNAR FRACTURE Left 02/18/2021   Procedure: OPEN REDUCTION INTERNAL FIXATION (ORIF) ULNAR FRACTURE;  Surgeon: Renette Butters, MD;  Location: [place];  Service: Orthopedics;  Laterality: Left;   TIBIA IM NAIL INSERTION Right 02/05/2021   Procedure: INTRAMEDULLARY (IM) NAIL TIBIAL;  Surgeon: Hiram Gash, MD;  Location: [place];  Service: Orthopedics;  Laterality: Right;   WISDOM TOOTH EXTRACTION     Social History:  reports that she has been smoking cigarettes. She has a 12.50 pack-year smoking history. She has never used smokeless tobacco. She reports that she does not currently use alcohol. She reports that she does not use drugs.  Allergies  Allergen Reactions   Amitriptyline Other (See Comments)    Cold sores on mouth, blister on tongue. No facial swelling    Celexa [Citalopram] Nausea And Vomiting    Took it 2 days. Had n/v and migraine   Methocarbamol Nausea Only  Naproxen Nausea Only   Other Nausea And Vomiting    ALL ANTIBIOTICS   Zofran [Ondansetron] Other (See Comments)    headache    Family History  Problem Relation Age of Onset   Anxiety disorder Mother    Hypertension Mother    Kidney failure Mother    Heart attack Paternal Grandfather    Healthy Daughter     Prior to Admission medications   Medication Sig Start Date End Date Taking? Authorizing Provider  acetaminophen (TYLENOL) 500 MG tablet Take 2  tablets (1,000 mg total) by mouth every 6 (six) hours as needed. 02/21/22 02/21/23  Chadwell, Ivin Booty, PA-C  ALPRAZolam Prudy Feeler) 1 MG tablet Take 1 tablet (1 mg total) by mouth 3 (three) times daily as needed for anxiety. Patient not taking: Reported on 06/08/2022 04/28/22   Mozingo, Thereasa Solo, NP  bisacodyl (DULCOLAX) 5 MG EC tablet Take 5 mg by mouth daily.    [provider]  DAPTOmycin (CUBICIN) 500 MG injection Inject into the vein. 04/13/22   [provider]  doxycycline (VIBRA-TABS) 100 MG tablet Take 1 tablet (100 mg total) by mouth 2 (two) times daily. 06/03/22   Randall Hiss, MD  Ibuprofen-diphenhydrAMINE HCl (ADVIL PM) 200-25 MG CAPS Take 2 tablets by mouth at bedtime as needed (sleep).    [provider]  OVER THE COUNTER MEDICATION Take 2 Scoops by mouth daily. body by visalus nutritional supplement powder    [provider]  Oxycodone HCl 10 MG TABS Take 10 mg by mouth every 4 (four) hours as needed for pain. 03/17/22   [provider]  pregabalin (LYRICA) 300 MG capsule Take 300 mg by mouth 2 (two) times daily. 01/25/21   [provider]  promethazine (PHENERGAN) 25 MG tablet Take 1 tablet (25 mg total) by mouth every 6 (six) hours as needed for nausea or vomiting. 06/08/22   Randall Hiss, MD  QUEtiapine Fumarate (SEROQUEL XR) 150 MG 24 hr tablet Take 1 tablet (150 mg total) by mouth at bedtime. 04/20/22   Melony Overly T, PA-C  tiZANidine (ZANAFLEX) 2 MG tablet Take 2 mg by mouth every 6 (six) hours as needed for muscle spasms. 03/17/22   [provider]    Physical Exam: Vitals:   07/16/22 0445 07/16/22 0536 07/16/22 0715 07/16/22 0718  BP: 120/79  (!) 124/91   Pulse: 76  (!) 49 84  Resp: 12  20 17   Temp:  97.6 F (36.4 C)    TempSrc:  Axillary    SpO2: 97%  94% 94%   General: 56 y.o. female resting in bed in NAD Eyes: PERRL, normal sclera ENMT: Nares patent w/o discharge, orophaynx clear, dentition  normal, ears w/o discharge/lesions/ulcers Neck: Supple, trachea midline Cardiovascular: RRR, +S1, S2, no m/g/r, equal pulses throughout Respiratory: CTABL, no w/r/r, normal WOB GI: BS+, NDNT, no masses noted, no organomegaly noted MSK: No e/c/c; small nodule noted on left breast exam left lateral and inferior to areola Neuro: A&O x 3, no focal deficits Psyc: flighty, tearful, but cooperative  Data Reviewed:  Results for orders placed or performed during the hospital encounter of 07/16/22 (from the past 24 hour(s))  CBC with Differential     Status: Abnormal   Collection Time: 07/16/22  2:00 AM  Result Value Ref Range   WBC 12.3 (H) 4.0 - 10.5 K/uL   RBC 4.75 3.87 - 5.11 MIL/uL   Hemoglobin 12.3 12.0 - 15.0 g/dL   HCT 07/18/22 35.5 -  46.0 %   MCV 82.5 80.0 - 100.0 fL   MCH 25.9 (L) 26.0 - 34.0 pg   MCHC 31.4 30.0 - 36.0 g/dL   RDW 18.4 (H) 11.5 - 15.5 %   Platelets 207 150 - 400 K/uL   nRBC 0.0 0.0 - 0.2 %   Neutrophils Relative % 83 %   Neutro Abs 10.3 (H) 1.7 - 7.7 K/uL   Lymphocytes Relative 9 %   Lymphs Abs 1.1 0.7 - 4.0 K/uL   Monocytes Relative 6 %   Monocytes Absolute 0.8 0.1 - 1.0 K/uL   Eosinophils Relative 1 %   Eosinophils Absolute 0.1 0.0 - 0.5 K/uL   Basophils Relative 0 %   Basophils Absolute 0.1 0.0 - 0.1 K/uL   Immature Granulocytes 1 %   Abs Immature Granulocytes 0.07 0.00 - 0.07 K/uL  Comprehensive metabolic panel     Status: Abnormal   Collection Time: 07/16/22  2:00 AM  Result Value Ref Range   Sodium 137 135 - 145 mmol/L   Potassium 3.3 (L) 3.5 - 5.1 mmol/L   Chloride 108 98 - 111 mmol/L   CO2 23 22 - 32 mmol/L   Glucose, Bld 97 70 - 99 mg/dL   BUN 16 6 - 20 mg/dL   Creatinine, Ser 1.00 0.44 - 1.00 mg/dL   Calcium 9.0 8.9 - 10.3 mg/dL   Total Protein 7.2 6.5 - 8.1 g/dL   Albumin 3.6 3.5 - 5.0 g/dL   AST 19 15 - 41 U/L   ALT 17 0 - 44 U/L   Alkaline Phosphatase 66 38 - 126 U/L   Total Bilirubin 0.7 0.3 - 1.2 mg/dL   GFR, Estimated >60 >60 mL/min    Anion gap 6 5 - 15  Acetaminophen level     Status: Abnormal   Collection Time: 07/16/22  2:00 AM  Result Value Ref Range   Acetaminophen (Tylenol), Serum <10 (L) 10 - 30 ug/mL  Salicylate level     Status: Abnormal   Collection Time: 07/16/22  2:00 AM  Result Value Ref Range   Salicylate Lvl [id number] (L) 7.0 - 30.0 mg/dL  Rapid urine drug screen (hospital performed)     Status: Abnormal   Collection Time: 07/16/22  2:00 AM  Result Value Ref Range   Opiates POSITIVE (A) NONE DETECTED   Cocaine NONE DETECTED NONE DETECTED   Benzodiazepines NONE DETECTED NONE DETECTED   Amphetamines NONE DETECTED NONE DETECTED   Tetrahydrocannabinol NONE DETECTED NONE DETECTED   Barbiturates NONE DETECTED NONE DETECTED  Ethanol     Status: None   Collection Time: 07/16/22  2:00 AM  Result Value Ref Range   Alcohol, Ethyl (B) <10 <10 mg/dL   XR Left Tib-Fib Status post prior internal fixation of comminuted and displaced tibial plateau fracture. No new fracture.  CTH Negative head CT. No acute intracranial abnormality.   Assessment and Plan: Possible drug overdose Altered mental status Chronic pain     - placed in obs, tele     - per mother, she has a drug abuse history and was missing several pain meds from her pill bottle; apparently she had a similar episode a couple of months ago; pain endorses increased pain recently     - psyc consult  Hypokalemia     - replace K+; check Mg2+  Anxiety PTSD     - continue home regimen  Left breast nodule     - continue outpt follow up  Chronic osteomyelitis     -  continue home regimen when confirmed  Advance Care Planning:   Code Status: FULL  Consults: Psych  Family Communication: w/ mother by phone  Severity of Illness: The appropriate patient status for this patient is OBSERVATION. Observation status is judged to be reasonable and necessary in order to provide the required intensity of service to ensure the patient's safety. The patient's  presenting symptoms, physical exam findings, and initial radiographic and laboratory data in the context of their medical condition is felt to place them at decreased risk for further clinical deterioration. Furthermore, it is anticipated that the patient will be medically stable for discharge from the hospital within 2 midnights of admission.   Author: Jonnie Finner, DO 07/16/2022 8:34 AM  For on call review [email or web address].

## 2022-07-16 NOTE — ED Provider Notes (Signed)
[facility]ER[facility]ENCY DEPT Provider Note: Lowella Dell, MD, FACEP  CSN: [id number] MRN: [id number] ARRIVAL: 07/16/22 at 0122 ROOM: WA16/WA16   CHIEF COMPLAINT  Drug Overdose  Level 5 caveat: Altered mental status HISTORY OF PRESENT ILLNESS  07/16/22 1:48 AM Kerry Young is a 56 y.o. female with a history of multiple surgeries to her left lower leg following a fracture in September of this year.  She was found just prior to arrival by her mother having apparently slid out of her wheelchair onto her buttocks with her legs straight and however and her torso bent forward.  EMS found her to have an altered mental status.  She will moan and react to noxious stimuli but is otherwise somnolent.  She recently received prescriptions for oxycodone 10 mg every 4 hours as needed for pain.  If she has indeed been taking them every 4 hours she has 7 pills short according to pill count.  She also recently received a prescription for 120, 25 mg promethazine tablets.  If she has taken these as prescribed then there are 104 pills missing on pill count.  No spilled pills were noted at the scene but there was no lid on the pill bottle; EMS sealed the pill bottle with a rubber glove.   Past Medical History:  Diagnosis Date   Anxiety    Arthritis    Migraines    PONV (postoperative nausea and vomiting)    PTSD (post-traumatic stress disorder)    Sciatica    UTI (urinary tract infection)     Past Surgical History:  Procedure Laterality Date   COLPOSCOPY N/A 10/26/2019   Procedure: COLPOSCOPY with cervical biopsy;  Surgeon: Myna Hidalgo, DO;  Location: [facility];  Service: Gynecology;  Laterality: N/A;   EXTERNAL FIXATION LEG Left 02/18/2022   Procedure: EXTERNAL FIXATION LEG;  Surgeon: Roby Lofts, MD;  Location: [facility];  Service: Orthopedics;  Laterality: Left;   EXTERNAL FIXATION REMOVAL Left 03/25/2022   Procedure: REMOVAL EXTERNAL FIXATION LEG;  Surgeon: Roby Lofts, MD;  Location: [facility]  OR;  Service: Orthopedics;  Laterality: Left;   I & D EXTREMITY Left 02/13/2022   Procedure: IRRIGATION AND DEBRIDEMENT EXTREMITY;  Surgeon: Roby Lofts, MD;  Location: [facility] OR;  Service: Orthopedics;  Laterality: Left;   ORIF HUMERUS FRACTURE Right 02/07/2021   Procedure: Arthroscopic repair rotator cuff and fixation of PROXIMAL HUMERUS FRACTURE;  Surgeon: Bjorn Pippin, MD;  Location: [facility];  Service: Orthopedics;  Laterality: Right;   ORIF TIBIA PLATEAU Left 01/28/2022   Procedure: OPEN REDUCTION INTERNAL FIXATION (ORIF) TIBIAL PLATEAU;  Surgeon: Roby Lofts, MD;  Location: [facility];  Service: Orthopedics;  Laterality: Left;   ORIF ULNAR FRACTURE Left 02/18/2021   Procedure: OPEN REDUCTION INTERNAL FIXATION (ORIF) ULNAR FRACTURE;  Surgeon: Sheral Apley, MD;  Location: [facility];  Service: Orthopedics;  Laterality: Left;   TIBIA IM NAIL INSERTION Right 02/05/2021   Procedure: INTRAMEDULLARY (IM) NAIL TIBIAL;  Surgeon: Bjorn Pippin, MD;  Location: [facility];  Service: Orthopedics;  Laterality: Right;   WISDOM TOOTH EXTRACTION      Family History  Problem Relation Age of Onset   Anxiety disorder Mother    Hypertension Mother    Kidney failure Mother    Heart attack Paternal Grandfather    Healthy Daughter     Social History   Tobacco Use   Smoking status: Every Day    Packs/day: 0.50    Years: 25.00  Total pack years: 12.50    Types: Cigarettes    Last attempt to quit: 07/11/2018    Years since quitting: 4.0   Smokeless tobacco: Never  Vaping Use   Vaping Use: Never used  Substance Use Topics   Alcohol use: Not Currently    Comment: 1 per year maybe.   Drug use: No    Prior to Admission medications   Medication Sig Start Date End Date Taking? Authorizing Provider  acetaminophen (TYLENOL) 500 MG tablet Take 2 tablets (1,000 mg total) by mouth every 6 (six) hours as needed. 02/21/22 02/21/23  Chadwell, Ivin Booty, PA-C  ALPRAZolam Prudy Feeler) 1 MG tablet Take 1 tablet (1 mg total) by  mouth 3 (three) times daily as needed for anxiety. Patient not taking: Reported on 06/08/2022 04/28/22   Mozingo, Thereasa Solo, NP  bisacodyl (DULCOLAX) 5 MG EC tablet Take 5 mg by mouth daily.    [provider]  DAPTOmycin (CUBICIN) 500 MG injection Inject into the vein. 04/13/22   [provider]  doxycycline (VIBRA-TABS) 100 MG tablet Take 1 tablet (100 mg total) by mouth 2 (two) times daily. 06/03/22   Randall Hiss, MD  Ibuprofen-diphenhydrAMINE HCl (ADVIL PM) 200-25 MG CAPS Take 2 tablets by mouth at bedtime as needed (sleep).    [provider]  OVER THE COUNTER MEDICATION Take 2 Scoops by mouth daily. body by visalus nutritional supplement powder    [provider]  Oxycodone HCl 10 MG TABS Take 10 mg by mouth every 4 (four) hours as needed for pain. 03/17/22   [provider]  pregabalin (LYRICA) 300 MG capsule Take 300 mg by mouth 2 (two) times daily. 01/25/21   [provider]  promethazine (PHENERGAN) 25 MG tablet Take 1 tablet (25 mg total) by mouth every 6 (six) hours as needed for nausea or vomiting. 06/08/22   Randall Hiss, MD  QUEtiapine Fumarate (SEROQUEL XR) 150 MG 24 hr tablet Take 1 tablet (150 mg total) by mouth at bedtime. 04/20/22   Melony Overly T, PA-C  tiZANidine (ZANAFLEX) 2 MG tablet Take 2 mg by mouth every 6 (six) hours as needed for muscle spasms. 03/17/22   [provider]    Allergies Amitriptyline, Celexa [citalopram], Methocarbamol, Naproxen, Other, and Zofran [ondansetron]   REVIEW OF SYSTEMS  Negative except as noted here or in the History of Present Illness.   PHYSICAL EXAMINATION  Initial Vital Signs Blood pressure (!) 109/91, pulse 84, temperature (!) 97.5 F (36.4 C), temperature source Rectal, resp. rate 12, SpO2 96 %.  Examination General: Well-developed, well-nourished female in no acute distress; appearance consistent with age of record HENT: normocephalic;  atraumatic Eyes: Pupils 3 mm and sluggish; periocular xanthelasma Neck: supple Heart: regular rate and rhythm Lungs: clear to auscultation bilaterally Abdomen: soft; nondistended; nontender; bowel sounds present Extremities: Chronic appearing deformities of left knee and ankle  Neurologic: Somnolent; will move extremities to noxious stimuli Skin: Warm and dry   RESULTS  Summary of this visit's results, reviewed and interpreted by myself:   EKG Interpretation  Date/Time:  Thursday July 16 2022 01:44:35 EST Ventricular Rate:  80 PR Interval:  146 QRS Duration: 93 QT Interval:  386 QTC Calculation: 446 R Axis:   50 Text Interpretation: Sinus rhythm Abnormal R-wave progression, early transition Confirmed by Paula Libra ([id number]) on 07/16/2022 1:48:07 AM       Laboratory Studies: Results for orders placed or performed during the hospital encounter of 07/16/22 (from the past  24 hour(s))  CBC with Differential     Status: Abnormal   Collection Time: 07/16/22  2:00 AM  Result Value Ref Range   WBC 12.3 (H) 4.0 - 10.5 K/uL   RBC 4.75 3.87 - 5.11 MIL/uL   Hemoglobin 12.3 12.0 - 15.0 g/dL   HCT 03.7 04.8 - 88.9 %   MCV 82.5 80.0 - 100.0 fL   MCH 25.9 (L) 26.0 - 34.0 pg   MCHC 31.4 30.0 - 36.0 g/dL   RDW 16.9 (H) 45.0 - 38.8 %   Platelets 207 150 - 400 K/uL   nRBC 0.0 0.0 - 0.2 %   Neutrophils Relative % 83 %   Neutro Abs 10.3 (H) 1.7 - 7.7 K/uL   Lymphocytes Relative 9 %   Lymphs Abs 1.1 0.7 - 4.0 K/uL   Monocytes Relative 6 %   Monocytes Absolute 0.8 0.1 - 1.0 K/uL   Eosinophils Relative 1 %   Eosinophils Absolute 0.1 0.0 - 0.5 K/uL   Basophils Relative 0 %   Basophils Absolute 0.1 0.0 - 0.1 K/uL   Immature Granulocytes 1 %   Abs Immature Granulocytes 0.07 0.00 - 0.07 K/uL  Comprehensive metabolic panel     Status: Abnormal   Collection Time: 07/16/22  2:00 AM  Result Value Ref Range   Sodium 137 135 - 145 mmol/L   Potassium 3.3 (L) 3.5 - 5.1 mmol/L   Chloride 108  98 - 111 mmol/L   CO2 23 22 - 32 mmol/L   Glucose, Bld 97 70 - 99 mg/dL   BUN 16 6 - 20 mg/dL   Creatinine, Ser 8.28 0.44 - 1.00 mg/dL   Calcium 9.0 8.9 - 00.3 mg/dL   Total Protein 7.2 6.5 - 8.1 g/dL   Albumin 3.6 3.5 - 5.0 g/dL   AST 19 15 - 41 U/L   ALT 17 0 - 44 U/L   Alkaline Phosphatase 66 38 - 126 U/L   Total Bilirubin 0.7 0.3 - 1.2 mg/dL   GFR, Estimated >49 >17 mL/min   Anion gap 6 5 - 15  Acetaminophen level     Status: Abnormal   Collection Time: 07/16/22  2:00 AM  Result Value Ref Range   Acetaminophen (Tylenol), Serum <10 (L) 10 - 30 ug/mL  Salicylate level     Status: Abnormal   Collection Time: 07/16/22  2:00 AM  Result Value Ref Range   Salicylate Lvl <7.0 (L) 7.0 - 30.0 mg/dL  Rapid urine drug screen (hospital performed)     Status: Abnormal   Collection Time: 07/16/22  2:00 AM  Result Value Ref Range   Opiates POSITIVE (A) NONE DETECTED   Cocaine NONE DETECTED NONE DETECTED   Benzodiazepines NONE DETECTED NONE DETECTED   Amphetamines NONE DETECTED NONE DETECTED   Tetrahydrocannabinol NONE DETECTED NONE DETECTED   Barbiturates NONE DETECTED NONE DETECTED  Ethanol     Status: None   Collection Time: 07/16/22  2:00 AM  Result Value Ref Range   Alcohol, Ethyl (B) <10 <10 mg/dL   Imaging Studies: CT Head Wo Contrast  Result Date: 07/16/2022 CLINICAL DATA:  Initial evaluation for overdose, delirium. EXAM: CT HEAD WITHOUT CONTRAST TECHNIQUE: Contiguous axial images were obtained from the base of the skull through the vertex without intravenous contrast. RADIATION DOSE REDUCTION: This exam was performed according to the departmental dose-optimization program which includes automated exposure control, adjustment of the mA and/or kV according to patient size and/or use of iterative reconstruction technique. COMPARISON:  Prior  study from 02/04/2022. FINDINGS: Brain: Cerebral volume within normal limits for age. No evidence for acute intracranial hemorrhage. No findings  to suggest acute large vessel territory infarct. No mass lesion, midline shift, or mass effect. Ventricles are normal in size without evidence for hydrocephalus. No extra-axial fluid collection identified. Vascular: No hyperdense vessel identified. Skull: Scalp soft tissues demonstrate no acute abnormality. Calvarium intact. Sinuses/Orbits: Globes and orbital soft tissues within normal limits. Small mucous retention cyst within the right maxillary sinus. Paranasal sinuses are otherwise clear. No mastoid effusion. IMPRESSION: Negative head CT.  No acute intracranial abnormality. Electronically Signed   By: Rise MuBenjamin  McClintock M.D.   On: 07/16/2022 02:59   DG Tibia/Fibula Left  Result Date: 07/16/2022 CLINICAL DATA:  Left lower extremity deformity. EXAM: LEFT TIBIA AND FIBULA - 2 VIEW COMPARISON:  Radiograph dated 02/13/2022 and fluoroscopic study dated 03/25/2022. FINDINGS: Status post prior internal fixation of comminuted and displaced tibial plateau fracture. There is apparent nonunion of the fracture fragments. The hardware is intact. No new fracture. The bones are osteopenic. There is narrowing of the medial and lateral compartments of the knee. The soft tissues are grossly unremarkable. IMPRESSION: Status post prior internal fixation of comminuted and displaced tibial plateau fracture. No new fracture. Electronically Signed   By: Elgie CollardArash  Radparvar M.D.   On: 07/16/2022 02:46    ED COURSE and MDM  Nursing notes, initial and subsequent vitals signs, including pulse oximetry, reviewed and interpreted by myself.  Vitals:   07/16/22 0200 07/16/22 0215 07/16/22 0240 07/16/22 0330  BP: 112/74 99/79 113/82 118/83  Pulse: 82 82 81 77  Resp: 12 11 11 11   Temp:      TempSrc:      SpO2: 96% 93% 96% 96%   Medications - No data to display  It is unclear if the patient indeed took an overdose of promethazine or if perhaps the pill bottle had been spilled.  Will obtain a head CT to make sure she has not had  an acute neurologic event such as an intracranial hemorrhage.   4:00 AM No evidence of intracranial abnormality.  Patient still somnolent.  Vitals have been stable.  It is still unclear what quantity of drugs the patient may have taken.  We will have her admitted for observation and likely psychiatric consultation.  PROCEDURES  Procedures CRITICAL CARE Performed by: Carlisle BeersJohn L Damiya Sandefur Total critical care time: 30 minutes Critical care time was exclusive of separately billable procedures and treating other patients. Critical care was necessary to treat or prevent imminent or life-threatening deterioration. Critical care was time spent personally by me on the following activities: development of treatment plan with patient and/or surrogate as well as nursing, discussions with consultants, evaluation of patient's response to treatment, examination of patient, obtaining history from patient or surrogate, ordering and performing treatments and interventions, ordering and review of laboratory studies, ordering and review of radiographic studies, pulse oximetry and re-evaluation of patient's condition.   ED DIAGNOSES     ICD-10-CM   1. Overdose of undetermined intent, initial encounter  T50.904A          Paula LibraMolpus, Latham Kinzler, MD 07/16/22 [phone]

## 2022-07-16 NOTE — ED Notes (Signed)
Patient with suspected overdose on oxycodone and phenergan.  She is somulent in presention.  She will respond to stimuli. She sat upright in the bed when staff attempting to reposition her.  She tolerated in and out cath with no response.  She is on the cardiac monitor.

## 2022-07-16 NOTE — Consult Note (Signed)
   Psychiatric consult for suspected overdose.  Unclear if her overdose was intentional or unintentional.  Per chart review patient has similar presentation a few months ago, likely not evaluated by psychiatric team.  Will assess patient when she arrives to medical floor.  In the interim, in the event patient attempts to leave AGAINST MEDICAL ADVICE will need to be placed under IVC at that time.

## 2022-07-17 DIAGNOSIS — T50904A Poisoning by unspecified drugs, medicaments and biological substances, undetermined, initial encounter: Secondary | ICD-10-CM | POA: Diagnosis not present

## 2022-07-17 DIAGNOSIS — E876 Hypokalemia: Secondary | ICD-10-CM | POA: Diagnosis not present

## 2022-07-17 LAB — COMPREHENSIVE METABOLIC PANEL
ALT: 14 U/L (ref 0–44)
AST: 18 U/L (ref 15–41)
Albumin: 2.6 g/dL — ABNORMAL LOW (ref 3.5–5.0)
Alkaline Phosphatase: 51 U/L (ref 38–126)
Anion gap: 7 (ref 5–15)
BUN: 12 mg/dL (ref 6–20)
CO2: 21 mmol/L — ABNORMAL LOW (ref 22–32)
Calcium: 8.4 mg/dL — ABNORMAL LOW (ref 8.9–10.3)
Chloride: 111 mmol/L (ref 98–111)
Creatinine, Ser: 0.58 mg/dL (ref 0.44–1.00)
GFR, Estimated: >60 mL/min (ref >60)
Glucose, Bld: 89 mg/dL (ref 70–99)
Potassium: 3.3 mmol/L — ABNORMAL LOW (ref 3.5–5.1)
Sodium: 139 mmol/L (ref 135–145)
Total Bilirubin: 0.8 mg/dL (ref 0.3–1.2)
Total Protein: 5.9 g/dL — ABNORMAL LOW (ref 6.5–8.1)

## 2022-07-17 LAB — CBC
HCT: 38.2 % (ref 36.0–46.0)
Hemoglobin: 12 g/dL (ref 12.0–15.0)
MCH: 25.5 pg — ABNORMAL LOW (ref 26.0–34.0)
MCHC: 31.4 g/dL (ref 30.0–36.0)
MCV: 81.1 fL (ref 80.0–100.0)
Platelets: 202 10*3/uL (ref 150–400)
RBC: 4.71 MIL/uL (ref 3.87–5.11)
RDW: 18.6 % — ABNORMAL HIGH (ref 11.5–15.5)
WBC: 6.7 10*3/uL (ref 4.0–10.5)
nRBC: 0 % (ref 0.0–0.2)

## 2022-07-17 MED ORDER — OXYCODONE HCL 5 MG PO TABS
5.0000 mg | ORAL_TABLET | Freq: Four times a day (QID) | ORAL | Status: DC | PRN
  Administered 2022-07-17 – 2022-07-23 (×18): 5 mg via ORAL
  Filled 2022-07-17 (×18): qty 1

## 2022-07-17 MED ORDER — PREGABALIN 75 MG PO CAPS
300.0000 mg | ORAL_CAPSULE | Freq: Two times a day (BID) | ORAL | Status: DC
  Administered 2022-07-17 – 2022-07-23 (×13): 300 mg via ORAL
  Filled 2022-07-17 (×13): qty 4

## 2022-07-17 MED ORDER — DOXYCYCLINE HYCLATE 100 MG PO TABS
100.0000 mg | ORAL_TABLET | Freq: Two times a day (BID) | ORAL | Status: DC
  Administered 2022-07-17 – 2022-07-23 (×13): 100 mg via ORAL
  Filled 2022-07-17 (×13): qty 1

## 2022-07-17 MED ORDER — POTASSIUM CHLORIDE CRYS ER 20 MEQ PO TBCR: 40.0000 meq | EXTENDED_RELEASE_TABLET | Freq: Once | ORAL | Status: DC

## 2022-07-17 NOTE — TOC Initial Note (Signed)
Transition of Care [facility]) - Initial/Assessment Note    Patient Details  Name: Kerry Young MRN: [id number] Date of Birth: 24-Oct-1965  [facility] CM/SW Contact:    Leeroy Cha, RN Phone Number: 07/17/2022, 2:10 PM  Clinical Narrative:                 Patient with hx of drug abuse and possible overdose.  Sitter in the room. Following for toc needs.  Expected Discharge Plan: Home/Self Care Barriers to Discharge: Continued Medical Work up   Patient Goals and CMS Choice Patient states their goals for this hospitalization and ongoing recovery are:: to go home          Expected Discharge Plan and Services   Discharge Planning Services: CM Consult   Living arrangements for the past 2 months: [place]                                      Prior Living Arrangements/Services Living arrangements for the past 2 months: Single Family Home Lives with:: Significant Other Patient language and need for interpreter reviewed:: Yes Do you feel safe going back to the place where you live?: Yes            Criminal Activity/Legal Involvement Pertinent to Current Situation/Hospitalization: No - Comment as needed  Activities of Daily Living Home Assistive Devices/Equipment: None ADL Screening (condition at time of admission) Patient's cognitive ability adequate to safely complete daily activities?: Yes Is the patient deaf or have difficulty hearing?: No Does the patient have difficulty seeing, even when wearing glasses/contacts?: No Does the patient have difficulty concentrating, remembering, or making decisions?: No Patient able to express need for assistance with ADLs?: Yes Does the patient have difficulty dressing or bathing?: No Independently performs ADLs?: Yes (appropriate for developmental age) Does the patient have difficulty walking or climbing stairs?: No Weakness of Legs: Left Weakness of Arms/Hands: None  Permission Sought/Granted                   Emotional Assessment Appearance:: Appears stated age Attitude/Demeanor/Rapport: Guarded, Avoidant Affect (typically observed): Appropriate, Agitated Orientation: : Oriented to Self, Oriented to Place, Oriented to  Time, Oriented to Situation Alcohol / Substance Use: Illicit Drugs Psych Involvement: No (comment)  Admission diagnosis:  Medication overdose, undetermined intent, initial encounter [T50.904A] Overdose of undetermined intent, initial encounter [T50.904A] Patient Active Problem List   Diagnosis Date Noted   Medication overdose, undetermined intent, initial encounter 07/16/2022   Hypokalemia 07/16/2022   Breast nodule 07/16/2022   AMS (altered mental status) 07/16/2022   Chronic osteomyelitis ([place])    MRSA infection    Hardware complicating wound infection ([place])    Staphylococcal arthritis of knee ([employer])    Normocytic anemia 02/16/2022   Obesity (BMI 30-39.9) 02/14/2022   Chronic pain disorder 02/14/2022   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 02/13/2022   Closed fracture of left tibial plateau 01/27/2022   PTSD (post-traumatic stress disorder) 03/17/2021   Proximal humerus fracture 02/04/2021   GAD (generalized anxiety disorder) 07/08/2020   Mild depression 07/08/2020   Insomnia 07/08/2020   PCP:  Sue Lush, PA-C Pharmacy:   [id number] [address] [place] [address] Phone: [phone] Fax: [phone]     Social Determinants of Health (SDOH) Social History: SDOH Screenings  Food Insecurity: No Food Insecurity (07/16/2022)  Housing: Low Risk  (07/16/2022)  Transportation Needs: No Transportation Needs (07/16/2022)  Utilities: Not At Risk (07/16/2022)  Depression (PHQ2-9): Low Risk  (06/08/2022)  Tobacco Use: High Risk (07/16/2022)   SDOH Interventions:     Readmission Risk Interventions   Row Labels 02/21/2021    2:20 PM  Readmission  Risk Prevention Plan   Section Header. No data exists in this row.   Post Dischage Appt   Not Complete  Appt Comments   DC to SNF  Medication Screening   Complete  Transportation Screening   Complete
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
## 2022-07-17 NOTE — TOC Initial Note (Deleted)
Transition of Care [facility]) - Initial/Assessment Note    Patient Details  Name: Kerry Young MRN: [id number] Date of Birth: [birth year]  Transition of Care [facility]) CM/SW Contact:    Golda Acre, RN Phone Number: 07/17/2022, 8:29 AM  Clinical Narrative:                  Transition of Care [facility]) Screening Note   Patient Details  Name: Kerry Young Date of Birth: Dec 25, 1965   Transition of Care [facility]) CM/SW Contact:    Golda Acre, RN Phone Number: 07/17/2022, 8:29 AM    Transition of Care Department [facility]) has reviewed patient and no TOC needs have been identified at this time. We will continue to monitor patient advancement through interdisciplinary progression rounds. If new patient transition needs arise, please place a TOC consult.    Expected Discharge Plan: Home/Self Care Barriers to Discharge: Continued Medical Work up   Patient Goals and CMS Choice Patient states their goals for this hospitalization and ongoing recovery are:: to go home          Expected Discharge Plan and Services   Discharge Planning Services: CM Consult   Living arrangements for the past 2 months: Single Family Home                                      Prior Living Arrangements/Services Living arrangements for the past 2 months: Single Family Home Lives with:: Significant Other Patient language and need for interpreter reviewed:: Yes Do you feel safe going back to the place where you live?: Yes            Criminal Activity/Legal Involvement Pertinent to Current Situation/Hospitalization: No - Comment as needed  Activities of Daily Living Home Assistive Devices/Equipment: None ADL Screening (condition at time of admission) Patient's cognitive ability adequate to safely complete daily activities?: Yes Is the patient deaf or have difficulty hearing?: No Does the patient have difficulty seeing, even when wearing glasses/contacts?: No Does the patient have  difficulty concentrating, remembering, or making decisions?: No Patient able to express need for assistance with ADLs?: Yes Does the patient have difficulty dressing or bathing?: No Independently performs ADLs?: Yes (appropriate for developmental age) Does the patient have difficulty walking or climbing stairs?: No Weakness of Legs: Left Weakness of Arms/Hands: None  Permission Sought/Granted                  Emotional Assessment Appearance:: Appears stated age Attitude/Demeanor/Rapport: Guarded, Avoidant Affect (typically observed): Appropriate, Agitated Orientation: : Oriented to Self, Oriented to Place, Oriented to  Time, Oriented to Situation Alcohol / Substance Use: Illicit Drugs Psych Involvement: No (comment)  Admission diagnosis:  Medication overdose, undetermined intent, initial encounter [T50.904A] Overdose of undetermined intent, initial encounter [T50.904A] Patient Active Problem List   Diagnosis Date Noted   Medication overdose, undetermined intent, initial encounter 07/16/2022   Hypokalemia 07/16/2022   Breast nodule 07/16/2022   AMS (altered mental status) 07/16/2022   Chronic osteomyelitis (HCC)    MRSA infection    Hardware complicating wound infection (HCC)    Staphylococcal arthritis of knee (HCC)    Normocytic anemia 02/16/2022   Obesity (BMI 30-39.9) 02/14/2022   Chronic pain disorder 02/14/2022   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 02/13/2022   Closed fracture of left tibial plateau 01/27/2022   PTSD (post-traumatic stress disorder) 03/17/2021  Proximal humerus fracture 02/04/2021   GAD (generalized anxiety disorder) 07/08/2020   Mild depression 07/08/2020   Insomnia 07/08/2020   PCP:  Nathaneil Canary, PA-C Pharmacy:   [facility] [id number] - [address] [place] - [address] [facility] [address] [place] [address] Phone: [phone] Fax: [phone]     Social Determinants of Health (SDOH) Social  History: SDOH Screenings   Food Insecurity: No Food Insecurity (07/16/2022)  Housing: Low Risk  (07/16/2022)  Transportation Needs: No Transportation Needs (07/16/2022)  Utilities: Not At Risk (07/16/2022)  Depression (PHQ2-9): Low Risk  (06/08/2022)  Tobacco Use: High Risk (07/16/2022)   SDOH Interventions:     Readmission Risk Interventions   Row Labels 02/21/2021    2:20 PM  Readmission Risk Prevention Plan   Section Header. No data exists in this row.   Post Dischage Appt   Not Complete  Appt Comments   DC to SNF  Medication Screening   Complete  Transportation Screening   Complete

## 2022-07-17 NOTE — Care Plan (Signed)
Patient continues to have high anxiety, symptoms reduced from earlier due to ativan, making statements of "the good times have gone away", how long do I have to be in here", "do I have to have someone next to me?"  Pain and anxiety be addressed, patient denies either is working, however outward presentation and symptoms improving,  less restless, actively thankful for snacks provided.  Patient reporting nausea due to antibiotic, not actively retching, states earlier snacks makes it better than usual. No indications of emesis evident.

## 2022-07-17 NOTE — Progress Notes (Signed)
[facility] PROGRESS NOTE   Kerry Young UTM:[id number] DOB: 1966-06-29 DOA: 07/16/2022  PCP: Nathaneil Canary, PA-C  Brief History/Interval Summary: 56 y.o. female with medical history significant of chronic osteomyelitis on doxy; anxiety/PTSD. Presenting with altered mental status.  There was concern that patient took significantly large amounts of Phenergan as well as oxycodone.  She was hospitalized for further management of overdose.    Consultants: Psychiatry  Procedures: None    Subjective/Interval History: Patient very teary today.  Complains of pain in her left lower extremity.  She wants her Lyrica to be resumed.    Assessment/Plan:  Concern for drug overdose Apparently had multiple tablets of oxycodone and Phenergan missing from her pill bottle.  Apparently had similar episode a few months ago.  History was provided by patient's mother. Patient's mentation appears to have improved.  She seems to be hemodynamically stable. Sitter is in the room.  Psychiatry consult is pending.  Hypokalemia Will be repleted.  Magnesium was normal yesterday.  Chronic osteomyelitis Looks like she is on doxycycline which will be continued.  Left breast nodule Recommend outpatient evaluation including mammogram.  History of anxiety and PTSD As per psychiatry.  Left leg pain X-ray did not show any new fractures.  She has sustained fracture to that extremity previously.  Pain is likely neuropathic.  She is noted to be on Lyrica which will be resumed.  Obesity Estimated body mass index is 36.58 kg/m as calculated from the following:   Height as of 03/25/22: 5\' 2"  (1.575 m).   Weight as of 03/25/22: 90.7 kg.   DVT Prophylaxis: Lovenox Code Status: Full code Family Communication: Discussed with the patient.  No family at bedside Disposition Plan: To be determined.  Psychiatry to see  Status is: Observation The patient remains OBS appropriate and may d/c before 2 midnights  cleared by psychiatry.  Otherwise she will be here for longer.      Medications: Scheduled:  enoxaparin (LOVENOX) injection  40 mg Subcutaneous Q24H   potassium chloride  40 mEq Oral Once   pregabalin  300 mg Oral BID   Continuous: 05/25/22 **OR** acetaminophen, LORazepam, prochlorperazine  Antibiotics: Anti-infectives (From admission, onward)    None       Objective:  Vital Signs  Vitals:   07/16/22 1423 07/16/22 2138 07/16/22 2300 07/17/22 0335  BP: 130/83  108/85 120/84  Pulse: 84 84 92 93  Resp: 16  19 18   Temp: (!) 97.5 F (36.4 C) (!) 97.5 F (36.4 C) 98.6 F (37 C) 98.4 F (36.9 C)  TempSrc: Oral Oral Oral Axillary  SpO2: 95%  91% 91%   No intake or output data in the 24 hours ending 07/17/22 1036 There were no vitals filed for this visit.  General appearance: Awake alert.  In no distress.  Emotionally labile Resp: Clear to auscultation bilaterally.  Normal effort Cardio: S1-S2 is normal regular.  No S3-S4.  No rubs murmurs or bruit GI: Abdomen is soft.  Nontender nondistended.  Bowel sounds are present normal.  No masses organomegaly Extremities: No edema.  Full range of motion of lower extremities. Neurologic:  No focal neurological deficits.    Lab Results:  Data Reviewed: I have personally reviewed following labs and reports of the imaging studies  CBC: Recent Labs  Lab 07/16/22 0200 07/17/22 0705  WBC 12.3* 6.7  NEUTROABS 10.3*  --   HGB 12.3 12.0  HCT 39.2 38.2  MCV 82.5 81.1  PLT 207 202  Basic Metabolic Panel: Recent Labs  Lab 07/16/22 0200 07/17/22 0705  NA 137 139  K 3.3* 3.3*  CL 108 111  CO2 23 21*  GLUCOSE 97 89  BUN 16 12  CREATININE 1.00 0.58  CALCIUM 9.0 8.4*  MG 2.1  --     GFR: CrCl cannot be calculated (Unknown ideal weight.).  Liver Function Tests: Recent Labs  Lab 07/16/22 0200 07/17/22 0705  AST 19 18  ALT 17 14  ALKPHOS 66 51  BILITOT 0.7 0.8  PROT 7.2 5.9*  ALBUMIN 3.6 2.6*       Radiology Studies: CT Head Wo Contrast  Result Date: 07/16/2022 CLINICAL DATA:  Initial evaluation for overdose, delirium. EXAM: CT HEAD WITHOUT CONTRAST TECHNIQUE: Contiguous axial images were obtained from the base of the skull through the vertex without intravenous contrast. RADIATION DOSE REDUCTION: This exam was performed according to the departmental dose-optimization program which includes automated exposure control, adjustment of the mA and/or kV according to patient size and/or use of iterative reconstruction technique. COMPARISON:  Prior study from 02/04/2022. FINDINGS: Brain: Cerebral volume within normal limits for age. No evidence for acute intracranial hemorrhage. No findings to suggest acute large vessel territory infarct. No mass lesion, midline shift, or mass effect. Ventricles are normal in size without evidence for hydrocephalus. No extra-axial fluid collection identified. Vascular: No hyperdense vessel identified. Skull: Scalp soft tissues demonstrate no acute abnormality. Calvarium intact. Sinuses/Orbits: Globes and orbital soft tissues within normal limits. Small mucous retention cyst within the right maxillary sinus. Paranasal sinuses are otherwise clear. No mastoid effusion. IMPRESSION: Negative head CT.  No acute intracranial abnormality. Electronically Signed   By: Jeannine Boga M.D.   On: 07/16/2022 02:59   DG Tibia/Fibula Left  Result Date: 07/16/2022 CLINICAL DATA:  Left lower extremity deformity. EXAM: LEFT TIBIA AND FIBULA - 2 VIEW COMPARISON:  Radiograph dated 02/13/2022 and fluoroscopic study dated 03/25/2022. FINDINGS: Status post prior internal fixation of comminuted and displaced tibial plateau fracture. There is apparent nonunion of the fracture fragments. The hardware is intact. No new fracture. The bones are osteopenic. There is narrowing of the medial and lateral compartments of the knee. The soft tissues are grossly unremarkable. IMPRESSION: Status  post prior internal fixation of comminuted and displaced tibial plateau fracture. No new fracture. Electronically Signed   By: Anner Crete M.D.   On: 07/16/2022 02:46       LOS: 0 days   [facility] Pager on [email or web address]  07/17/2022, 10:36 AM

## 2022-07-18 DIAGNOSIS — M8660 Other chronic osteomyelitis, unspecified site: Secondary | ICD-10-CM | POA: Diagnosis not present

## 2022-07-18 DIAGNOSIS — T50904A Poisoning by unspecified drugs, medicaments and biological substances, undetermined, initial encounter: Secondary | ICD-10-CM | POA: Diagnosis not present

## 2022-07-18 LAB — BASIC METABOLIC PANEL
Anion gap: 6 (ref 5–15)
BUN: 10 mg/dL (ref 6–20)
CO2: 21 mmol/L — ABNORMAL LOW (ref 22–32)
Calcium: 8.3 mg/dL — ABNORMAL LOW (ref 8.9–10.3)
Chloride: 112 mmol/L — ABNORMAL HIGH (ref 98–111)
Creatinine, Ser: 0.54 mg/dL (ref 0.44–1.00)
GFR, Estimated: >60 mL/min (ref >60)
Glucose, Bld: 101 mg/dL — ABNORMAL HIGH (ref 70–99)
Potassium: 3.7 mmol/L (ref 3.5–5.1)
Sodium: 139 mmol/L (ref 135–145)

## 2022-07-18 LAB — HIV ANTIBODY (ROUTINE TESTING W REFLEX): HIV Screen 4th Generation wRfx: NONREACTIVE

## 2022-07-18 LAB — MAGNESIUM: Magnesium: 1.8 mg/dL (ref 1.7–2.4)
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
## 2022-07-18 NOTE — Plan of Care (Signed)
Patient remains emotional even when awakening from deep sleep. Immediately demands antianxiety med and pain med. Patient lying in bed with eyes closed post administration of meds. Body and face relaxed.

## 2022-07-18 NOTE — TOC Progression Note (Addendum)
Transition of Care [facility]) - Progression Note    Patient Details  Name: Kerry Young MRN: [id number] Date of Birth: 08/14/1965  [facility] CM/SW Contact  Adrian Prows, RN Phone Number: 07/18/2022, 6:31 PM  Clinical Narrative:    TOC notified for pt IVC; Affidavit and Petition for Involuntary Commitment and First Examination for Involuntary Commitment faxed to [facility] [phone]); spoke w/ Vergie Living and she confirms receipt but says First Examination does not match Affidavit and Petition for IVC; attempted to contact Dr Jerrel Ivory via secure chat; awaiting response.  1850- awaiting response from Dr Jerrel Ivory; contacted Cheri Rous, [facility] Supervisor; she will attempt to contact provider and [facility]; awaiting callback.  -1914- callback by Dr Jerrel Ivory; he was notified of need for paperwork correction; he says he is enroute to hospital to complete paperwork.  -2005- documents faxed to Criminal Magistrate; electronic confirmation received; called office and spoke w/ Vergie Living to confirm receipt; she says the documents have been received and are correct; awaiting custody order; Dr Jerrel Ivory notified. - custody order received; [employer] dispatch called; spoke w/ operator # [phone]; custody order received; documents placed in orange folder: original custody order, Affidavit and Petition for Involuntary Commitment and First Examination for Involuntary Commitment and 4 copies of each document; orange folder placed in pt shadow chart.  Expected Discharge Plan: Home/Self Care Barriers to Discharge: Continued Medical Work up  Expected Discharge Plan and Services   Discharge Planning Services: CM Consult   Living arrangements for the past 2 months: Single Family Home                                       Social Determinants of Health (SDOH) Interventions SDOH Screenings   Food Insecurity: No Food Insecurity (07/16/2022)   Housing: Low Risk  (07/16/2022)  Transportation Needs: No Transportation Needs (07/16/2022)  Utilities: Not At Risk (07/16/2022)  Depression (PHQ2-9): Low Risk  (06/08/2022)  Tobacco Use: High Risk (07/16/2022)    Readmission Risk Interventions    02/21/2021    2:20 PM  Readmission Risk Prevention Plan  Post Dischage Appt Not Complete  Appt Comments DC to SNF  Medication Screening Complete  Transportation Screening Complete

## 2022-07-18 NOTE — Progress Notes (Signed)
PROGRESS NOTE    Kerry Young  EXB:[id number] DOB: 24-Apr-1966 DOA: 07/16/2022 PCP: Leonard Downing   Brief Narrative:  56 y.o. female with medical history significant of chronic osteomyelitis on doxy; anxiety/PTSD. Presenting with altered mental status.  There was concern that patient took significantly large amounts of Phenergan as well as oxycodone.  Psychiatry was consulted.  Assessment & Plan:   Concern for drug overdose Apparently had multiple tablets of oxycodone and Phenergan missing from her pill bottle.  Apparently had similar episode a few months ago.  History was provided by patient's mother. Patient's mentation appears to have improved.  She seems to be hemodynamically stable. Sitter is in the room.  Psychiatry following.  Follow recommendations.  Hypokalemia Improved   chronic osteomyelitis Looks like she is on doxycycline which will be continued.   Left breast nodule Recommend outpatient evaluation including mammogram.   History of anxiety and PTSD As per psychiatry.   Left leg pain X-ray did not show any new fractures.  She has sustained fracture to that extremity previously.  Pain is likely neuropathic.  She is noted to be on Lyrica which will be resumed.   Obesity Outpatient follow-up     DVT Prophylaxis: Lovenox Code Status: Full code Family Communication: Discussed with the patient.  No family at bedside Disposition Plan: To be determined.   Pending psychiatry recommendations   Consultants: Psychiatry  Procedures: None  Antimicrobials: Doxycycline   Subjective: Patient seen and examined at bedside.  No fever, vomiting, chest pain reported.  Sitter at bedside.  Objective: Vitals:   07/17/22 1239 07/17/22 1423 07/17/22 2017 07/18/22 0549  BP: 126/86  118/77 117/79  Pulse: 78  75 80  Resp: 20  20 20   Temp: (!) 97.5 F (36.4 C)  98.5 F (36.9 C) 98.4 F (36.9 C)  TempSrc: Oral  Oral Oral  SpO2: 97%  96% 96%  Weight:  88.2 kg     Height:  5\' 2"  (1.575 m)      Intake/Output Summary (Last 24 hours) at 07/18/2022 1211 Last data filed at 07/18/2022 1127 Gross per 24 hour  Intake 800 ml  Output 2100 ml  Net -1300 ml   Filed Weights   07/17/22 1423  Weight: 88.2 kg    Examination:  General exam: Appears calm and comfortable.  Flat affect.  Poor historian. Respiratory system: Bilateral decreased breath sounds at bases Cardiovascular system: S1 & S2 heard, Rate controlled Gastrointestinal system: Abdomen is obese, nondistended, soft and nontender. Normal bowel sounds heard. Extremities: No cyanosis, clubbing, edema    Data Reviewed: I have personally reviewed following labs and imaging studies  CBC: Recent Labs  Lab 07/16/22 0200 07/17/22 0705  WBC 12.3* 6.7  NEUTROABS 10.3*  --   HGB 12.3 12.0  HCT 39.2 38.2  MCV 82.5 81.1  PLT 207 202   Basic Metabolic Panel: Recent Labs  Lab 07/16/22 0200 07/17/22 0705 07/18/22 0815  NA 137 139 139  K 3.3* 3.3* 3.7  CL 108 111 112*  CO2 23 21* 21*  GLUCOSE 97 89 101*  BUN 16 12 10   CREATININE 1.00 0.58 0.54  CALCIUM 9.0 8.4* 8.3*  MG 2.1  --  1.8   GFR: Estimated Creatinine Clearance: 80.9 mL/min (by C-G formula based on SCr of 0.54 mg/dL). Liver Function Tests: Recent Labs  Lab 07/16/22 0200 07/17/22 0705  AST 19 18  ALT 17 14  ALKPHOS 66 51  BILITOT 0.7 0.8  PROT 7.2 5.9*  ALBUMIN 3.6 2.6*   No results for input(s): "LIPASE", "AMYLASE" in the last 168 hours. No results for input(s): "AMMONIA" in the last 168 hours. Coagulation Profile: No results for input(s): "INR", "PROTIME" in the last 168 hours. Cardiac Enzymes: No results for input(s): "CKTOTAL", "CKMB", "CKMBINDEX", "TROPONINI" in the last 168 hours. BNP (last 3 results) No results for input(s): "PROBNP" in the last 8760 hours. HbA1C: No results for input(s): "HGBA1C" in the last 72 hours. CBG: No results for input(s): "GLUCAP" in the last 168 hours. Lipid Profile: No  results for input(s): "CHOL", "HDL", "LDLCALC", "TRIG", "CHOLHDL", "LDLDIRECT" in the last 72 hours. Thyroid Function Tests: No results for input(s): "TSH", "T4TOTAL", "FREET4", "T3FREE", "THYROIDAB" in the last 72 hours. Anemia Panel: No results for input(s): "VITAMINB12", "FOLATE", "FERRITIN", "TIBC", "IRON", "RETICCTPCT" in the last 72 hours. Sepsis Labs: No results for input(s): "PROCALCITON", "LATICACIDVEN" in the last 168 hours.  No results found for this or any previous visit (from the past 240 hour(s)).       Radiology Studies: No results found.      Scheduled Meds:  doxycycline  100 mg Oral BID   enoxaparin (LOVENOX) injection  40 mg Subcutaneous Q24H   potassium chloride  40 mEq Oral Once   pregabalin  300 mg Oral BID   Continuous Infusions:        Glade Lloyd, MD [facility] 07/18/2022, 12:11 PM

## 2022-07-18 NOTE — Progress Notes (Signed)
IVC paperwork filled out by this provider, given to LCSW.   Continue 1:1 obs. Will seek inpatient psychiatry tomorrow morning.   Carlyn Reichert, MD PGY-2

## 2022-07-19 DIAGNOSIS — T50904A Poisoning by unspecified drugs, medicaments and biological substances, undetermined, initial encounter: Secondary | ICD-10-CM | POA: Diagnosis not present

## 2022-07-19 MED ORDER — OXYCODONE HCL 5 MG PO TABS: 5.0000 mg | ORAL_TABLET | Freq: Four times a day (QID) | ORAL | Status: DC | PRN

## 2022-07-19 NOTE — TOC Progression Note (Addendum)
Transition of Care [facility]) - Progression Note    Patient Details  Name: Kerry Young MRN: [id number] Date of Birth: 1965-12-20  Transition of Care [facility]) CM/SW Contact  Green Quincy, Olegario Messier, RN Phone Number: 07/19/2022, 11:58 AM  Clinical Narrative: Will fax out for IP Psych-await bed offers for acceptance.Noted d/c summary-delay placed.  -2p-[facility]ed out to [facility]-await acceptance.    Expected Discharge Plan: Home/Self Care Barriers to Discharge: Psych Bed not available  Expected Discharge Plan and Services   Discharge Planning Services: CM Consult   Living arrangements for the past 2 months: Single Family Home Expected Discharge Date: 07/19/22                                     Social Determinants of Health (SDOH) Interventions SDOH Screenings   Food Insecurity: No Food Insecurity (07/16/2022)  Housing: Low Risk  (07/16/2022)  Transportation Needs: No Transportation Needs (07/16/2022)  Utilities: Not At Risk (07/16/2022)  Depression (PHQ2-9): Low Risk  (06/08/2022)  Tobacco Use: High Risk (07/16/2022)    Readmission Risk Interventions    02/21/2021    2:20 PM  Readmission Risk Prevention Plan  Post Dischage Appt Not Complete  Appt Comments DC to SNF  Medication Screening Complete  Transportation Screening Complete

## 2022-07-19 NOTE — Discharge Summary (Addendum)
Physician Discharge Summary  Kerry Young HKV:[id number] DOB: [birth year] DOA: 07/16/2022  PCP: Nathaneil Canary, PA-C  Admit date: 07/16/2022  Addendum to d/c summary prepared by Dr Hanley Ben on 12/31 Discharge date: 07/22/2022 ,  patient has been medically stable to discharge since 12/31 , awaiting behavioral health bed until 1/3.  Admitted From: Home Disposition: [facility]  Recommendations for Outpatient Follow-up:  Follow up with [facility] provider at earliest convenience   Home Health: No Equipment/Devices: None  Discharge Condition: Stable CODE STATUS: Full Diet recommendation: Regular  Brief/Interim Summary: 56 y.o. female with medical history significant of chronic osteomyelitis on doxy; anxiety/PTSD. Presenting with altered mental status.  There was concern that patient took significantly large amounts of Phenergan as well as oxycodone.  Psychiatry was consulted.  Psychiatry recommended inpatient psychiatric hospitalization.  Patient has been IVC'd by psychiatry.  She is currently medically stable for discharge.  She will be discharged to inpatient psychiatric hospital once bed is available.  Discharge Diagnoses:   Concern for drug overdose Apparently had multiple tablets of oxycodone and Phenergan missing from her pill bottle.  Apparently had similar episode a few months ago.  History was provided by patient's mother. Patient's mentation appears to have improved.  She seems to be hemodynamically stable. Sitter is in the room.   -Psychiatry recommended inpatient psychiatric hospitalization.  Patient has been IVC'd by psychiatry.  She is currently medically stable for discharge.  She will be discharged to inpatient psychiatric hospital once bed is available.  Hypokalemia Improved    chronic osteomyelitis Looks like she is on doxycycline which will be continued.   Left breast nodule Recommend outpatient evaluation including mammogram.   History of anxiety and PTSD As per psychiatry.    Left leg pain X-ray did not show any new fractures.  She has sustained fracture to that extremity previously.  Pain is likely neuropathic.  She is noted to be on Lyrica which will be resumed.   Obesity Outpatient follow-up  Discharge Instructions  Discharge Instructions     Diet general   Complete by: As directed    Increase activity slowly   Complete by: As directed       Allergies as of 07/19/2022       Reactions   Amitriptyline Other (See Comments)   Cold sores on mouth, blister on tongue. No facial swelling    Celexa [citalopram] Nausea And Vomiting, Other (See Comments)   Took it 2 days. Had n/v and migraine.   Methocarbamol Nausea Only   Naproxen Nausea Only   Other Nausea And Vomiting, Other (See Comments)   "ALL ANTIBIOTICS"   Zofran [ondansetron] Other (See Comments)   Headaches        Medication List     STOP taking these medications    Advil PM 200-25 MG Caps Generic drug: Ibuprofen-diphenhydrAMINE HCl   ALPRAZolam 1 MG tablet Commonly known as: Xanax   promethazine 25 MG tablet Commonly known as: PHENERGAN   QUEtiapine Fumarate 150 MG 24 hr tablet Commonly known as: SEROQUEL XR       TAKE these medications    acetaminophen 500 MG tablet Commonly known as: TYLENOL Take 2 tablets (1,000 mg total) by mouth every 6 (six) hours as needed.   doxycycline 100 MG tablet Commonly known as: VIBRA-TABS Take 1 tablet (100 mg total) by mouth 2 (two) times daily.   oxyCODONE 5 MG immediate release tablet Commonly known as: Oxy IR/ROXICODONE Take 1 tablet (5 mg total) by mouth every 6 (  six) hours as needed for severe pain. What changed:  medication strength how much to take when to take this reasons to take this additional instructions   pregabalin 300 MG capsule Commonly known as: LYRICA Take 300 mg by mouth 2 (two) times daily.        Allergies  Allergen Reactions   Amitriptyline Other (See Comments)    Cold sores on mouth, blister  on tongue. No facial swelling    Celexa [Citalopram] Nausea And Vomiting and Other (See Comments)    Took it 2 days. Had n/v and migraine.   Methocarbamol Nausea Only   Naproxen Nausea Only   Other Nausea And Vomiting and Other (See Comments)    "ALL ANTIBIOTICS"   Zofran [Ondansetron] Other (See Comments)    Headaches     Consultations: Psychiatry   Procedures/Studies: CT Head Wo Contrast  Result Date: 07/16/2022 CLINICAL DATA:  Initial evaluation for overdose, delirium. EXAM: CT HEAD WITHOUT CONTRAST TECHNIQUE: Contiguous axial images were obtained from the base of the skull through the vertex without intravenous contrast. RADIATION DOSE REDUCTION: This exam was performed according to the departmental dose-optimization program which includes automated exposure control, adjustment of the mA and/or kV according to patient size and/or use of iterative reconstruction technique. COMPARISON:  Prior study from 02/04/2022. FINDINGS: Brain: Cerebral volume within normal limits for age. No evidence for acute intracranial hemorrhage. No findings to suggest acute large vessel territory infarct. No mass lesion, midline shift, or mass effect. Ventricles are normal in size without evidence for hydrocephalus. No extra-axial fluid collection identified. Vascular: No hyperdense vessel identified. Skull: Scalp soft tissues demonstrate no acute abnormality. Calvarium intact. Sinuses/Orbits: Globes and orbital soft tissues within normal limits. Small mucous retention cyst within the right maxillary sinus. Paranasal sinuses are otherwise clear. No mastoid effusion. IMPRESSION: Negative head CT.  No acute intracranial abnormality. Electronically Signed   By: Rise Mu M.D.   On: 07/16/2022 02:59   DG Tibia/Fibula Left  Result Date: 07/16/2022 CLINICAL DATA:  Left lower extremity deformity. EXAM: LEFT TIBIA AND FIBULA - 2 VIEW COMPARISON:  Radiograph dated 02/13/2022 and fluoroscopic study dated  03/25/2022. FINDINGS: Status post prior internal fixation of comminuted and displaced tibial plateau fracture. There is apparent nonunion of the fracture fragments. The hardware is intact. No new fracture. The bones are osteopenic. There is narrowing of the medial and lateral compartments of the knee. The soft tissues are grossly unremarkable. IMPRESSION: Status post prior internal fixation of comminuted and displaced tibial plateau fracture. No new fracture. Electronically Signed   By: Elgie Collard M.D.   On: 07/16/2022 02:46      Subjective: Patient seen and examined at bedside.  No fever, seizures or agitation reported.  Discharge Exam: Vitals:   07/18/22 2114 07/19/22 0558  BP: (!) 135/96 117/77  Pulse: 89 70  Resp: 17 17  Temp: 98.7 F (37.1 C) 98.4 F (36.9 C)  SpO2: 95% 94%    General: Pt is alert, awake, not in acute distress.  Slow to respond.  Poor historian. Cardiovascular: rate controlled, S1/S2 + Respiratory: bilateral decreased breath sounds at bases Abdominal: Soft, obese, NT, ND, bowel sounds + Extremities: no edema, no cyanosis    The results of significant diagnostics from this hospitalization (including imaging, microbiology, ancillary and laboratory) are listed below for reference.     Microbiology: No results found for this or any previous visit (from the past 240 hour(s)).   Labs: BNP (last 3 results) No results for input(s): "  BNP" in the last 8760 hours. Basic Metabolic Panel: Recent Labs  Lab 07/16/22 0200 07/17/22 0705 07/18/22 0815  NA 137 139 139  K 3.3* 3.3* 3.7  CL 108 111 112*  CO2 23 21* 21*  GLUCOSE 97 89 101*  BUN 16 12 10   CREATININE 1.00 0.58 0.54  CALCIUM 9.0 8.4* 8.3*  MG 2.1  --  1.8   Liver Function Tests: Recent Labs  Lab 07/16/22 0200 07/17/22 0705  AST 19 18  ALT 17 14  ALKPHOS 66 51  BILITOT 0.7 0.8  PROT 7.2 5.9*  ALBUMIN 3.6 2.6*   No results for input(s): "LIPASE", "AMYLASE" in the last 168 hours. No  results for input(s): "AMMONIA" in the last 168 hours. CBC: Recent Labs  Lab 07/16/22 0200 07/17/22 0705  WBC 12.3* 6.7  NEUTROABS 10.3*  --   HGB 12.3 12.0  HCT 39.2 38.2  MCV 82.5 81.1  PLT 207 202   Cardiac Enzymes: No results for input(s): "CKTOTAL", "CKMB", "CKMBINDEX", "TROPONINI" in the last 168 hours. BNP: Invalid input(s): "POCBNP" CBG: No results for input(s): "GLUCAP" in the last 168 hours. D-Dimer No results for input(s): "DDIMER" in the last 72 hours. Hgb A1c No results for input(s): "HGBA1C" in the last 72 hours. Lipid Profile No results for input(s): "CHOL", "HDL", "LDLCALC", "TRIG", "CHOLHDL", "LDLDIRECT" in the last 72 hours. Thyroid function studies No results for input(s): "TSH", "T4TOTAL", "T3FREE", "THYROIDAB" in the last 72 hours.  Invalid input(s): "FREET3" Anemia work up No results for input(s): "VITAMINB12", "FOLATE", "FERRITIN", "TIBC", "IRON", "RETICCTPCT" in the last 72 hours. Urinalysis    Component Value Date/Time   COLORURINE YELLOW 02/04/2021 1620   APPEARANCEUR HAZY (A) 02/04/2021 1620   LABSPEC >1.046 (H) 02/04/2021 1620   PHURINE 5.0 02/04/2021 1620   GLUCOSEU NEGATIVE 02/04/2021 1620   HGBUR MODERATE (A) 02/04/2021 1620   BILIRUBINUR NEGATIVE 02/04/2021 1620   KETONESUR 20 (A) 02/04/2021 1620   PROTEINUR NEGATIVE 02/04/2021 1620   UROBILINOGEN 0.2 05/06/2015 1422   NITRITE NEGATIVE 02/04/2021 1620   LEUKOCYTESUR NEGATIVE 02/04/2021 1620   Sepsis Labs Recent Labs  Lab 07/16/22 0200 07/17/22 0705  WBC 12.3* 6.7   Microbiology No results found for this or any previous visit (from the past 240 hour(s)).   Time coordinating discharge: 35 minutes  SIGNED:   07/19/22, MD  [facility] 07/19/2022, 11:27 AM

## 2022-07-20 NOTE — TOC Progression Note (Signed)
Transition of Care [facility]) - Progression Note    Patient Details  Name: Kerry Young MRN: [id number] Date of Birth: 1965-12-31  Transition [facility] Care [facility]) CM/SW Contact  Treniece Holsclaw, Juliann Pulse, RN Phone Number: 07/20/2022, 2:42 PM  Clinical Narrative:Faxed w/confirmation additional facilities: [facility]   See prior [facility] note for additional info;awaiting outcome of acceptance.     Expected Discharge Plan: Psychiatric Hospital Barriers to Discharge: Psych Bed not available  Expected Discharge Plan and Services   Discharge Planning Services: CM Consult   Living arrangements for the past 2 months: Single Family Home Expected Discharge Date: 07/19/22                                     Social Determinants of Health ([place]) Interventions SDOH Screenings   Food Insecurity: No Food Insecurity (07/16/2022)  Housing: Low Risk  (07/16/2022)  Transportation Needs: No Transportation Needs (07/16/2022)  Utilities: Not At Risk (07/16/2022)  Depression (PHQ2-9): Low Risk  (06/08/2022)  Tobacco Use: High Risk (07/16/2022)    Readmission Risk Interventions    02/21/2021    2:20 PM  Readmission Risk Prevention Plan  Post Dischage Appt Not Complete  Appt Comments DC to SNF  Medication Screening Complete  Transportation Screening Complete
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
## 2022-07-20 NOTE — Progress Notes (Signed)
Patient is waiting to be discharged to inpatient psychiatric hospital.  She is currently medically stable for discharge.  Patient seen and examined at bedside.  Please refer to the full discharge summary done by me on 07/19/2022 for full details.

## 2022-07-21 DIAGNOSIS — R4182 Altered mental status, unspecified: Secondary | ICD-10-CM

## 2022-07-21 DIAGNOSIS — F411 Generalized anxiety disorder: Secondary | ICD-10-CM

## 2022-07-21 DIAGNOSIS — G894 Chronic pain syndrome: Secondary | ICD-10-CM

## 2022-07-21 DIAGNOSIS — T50904A Poisoning by unspecified drugs, medicaments and biological substances, undetermined, initial encounter: Secondary | ICD-10-CM | POA: Diagnosis not present

## 2022-07-21 DIAGNOSIS — F4310 Post-traumatic stress disorder, unspecified: Secondary | ICD-10-CM

## 2022-07-21 MED ORDER — METOCLOPRAMIDE HCL 10 MG PO TABS
10.0000 mg | ORAL_TABLET | Freq: Four times a day (QID) | ORAL | Status: DC | PRN
  Administered 2022-07-21 – 2022-07-23 (×5): 10 mg via ORAL
  Filled 2022-07-21 (×5): qty 1

## 2022-07-21 NOTE — TOC Progression Note (Signed)
Transition of Care [facility]) - Progression Note    Patient Details  Name: Kerry Young MRN: [id number] Date of Birth: 1965-10-27  Transition of Care [facility]) CM/SW [place], RN Phone Number:[phone]  07/21/2022, 2:45 PM  Clinical Narrative:    CM has reached out to [facility] ac to determine if [facility] has an inpatient psych bed available for this patient. Per [facility] she will review patients information to determine if [facility] is able to offer bed.    Expected Discharge Plan: Psychiatric Hospital Barriers to Discharge: Psych Bed not available  Expected Discharge Plan and Services   Discharge Planning Services: CM Consult   Living arrangements for the past 2 months: Single Family Home Expected Discharge Date: 07/19/22                                     Social Determinants of Health ([place]) Interventions SDOH Screenings   Food Insecurity: No Food Insecurity (07/16/2022)  Housing: Low Risk  (07/16/2022)  Transportation Needs: No Transportation Needs (07/16/2022)  Utilities: Not At Risk (07/16/2022)  Depression (PHQ2-9): Low Risk  (06/08/2022)  Tobacco Use: High Risk (07/16/2022)    Readmission Risk Interventions    02/21/2021    2:20 PM  Readmission Risk Prevention Plan  Post Dischage Appt Not Complete  Appt Comments DC to SNF  Medication Screening Complete  Transportation Screening Complete
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
## 2022-07-21 NOTE — Progress Notes (Signed)
Patient is currently medically stable for discharge to behavioral health hospital.  Please refer to the full discharge summary done by me on 07/19/2022 for full details.  Patient seen and examined at bedside.
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
## 2022-07-21 NOTE — Progress Notes (Addendum)
Pt was accepted to [facility] 07/22/22 PENDING IVC paperwork faxed-Please fax IVC paperwork tonight to [phone]; Bed Assignment L-26A  Pt meets inpatient criteria per Suella Broad, DNP   Attending Physician will be Dr. Caren Griffins, DO  Report can be called to: [phone]  Pt can arrive: BED IS READY  Care Team notified: [place], RN, Sherol Dade, RN, Tonette Bihari, RN, Carmin Richmond, RN  Nadara Mode, [place] 07/21/2022 @ 11:33 PM

## 2022-07-22 LAB — URINALYSIS, ROUTINE W REFLEX MICROSCOPIC
Bilirubin Urine: NEGATIVE
Glucose, UA: NEGATIVE mg/dL
Hgb urine dipstick: NEGATIVE
Ketones, ur: NEGATIVE mg/dL
Leukocytes,Ua: NEGATIVE
Nitrite: NEGATIVE
Protein, ur: NEGATIVE mg/dL
Specific Gravity, Urine: 1.019 (ref 1.005–1.030)
pH: 6 (ref 5.0–8.0)

## 2022-07-22 NOTE — Progress Notes (Signed)
IVC paperwork faxed to [facility] [phone] for bed assignment L-26A

## 2022-07-22 NOTE — Progress Notes (Signed)
Per [place] Wynonia Hazard, RN CSW was informed that pt will be accepted to [facility] 07/22/22; Bed Assignment 325  Pt meets inpatient criteria per Suella Broad, DNP   Attending Physician will be Dr. Alethia Berthold  Report can be called to: [phone]  Pt can arrive: BED IS READY NOW  Per [place] all needed parties have been notified about this transfer; nursing facilitated.   Nadara Mode, LCSWA 07/22/2022 @ 10:35 PM

## 2022-07-22 NOTE — Progress Notes (Addendum)
Kerry Young is seen and examined , safety sitter at bedside, no acute event overnight, she is medically ready to discharge to behavioral health hospital.  Please refer to the full discharge summary done by Dr Starla Link on 07/19/2022 for full details.    Addendum: she had an elective surgery to be scheduled with ortho Dr Doreatha Martin , Dr Doreatha Martin advised her to call to schedule after she is released from behavioral health. She is made aware and expressed understanding.

## 2022-07-22 NOTE — TOC Progression Note (Addendum)
Transition of Care [facility]) - Progression Note    Patient Details  Name: Kerry Young MRN: [id number] Date of Birth: 05-12-1966  Transition of Care [facility]) CM/SW [place], RN Phone Number:[phone]  07/22/2022, 3:15 PM  Clinical Narrative:    TOC following has bed  Pt was accepted to [facility] 07/22/22; Bed Assignment L-26A  Pt meets inpatient criteria per Suella Broad, DNP  Attending Physician will be Dr. Caren Griffins, DO  Report can be called to: [phone]  Pt can arrive: BED IS READY   Currently awaiting covid swab results before patient can discharge. Results still processing.    Expected Discharge Plan: Psychiatric Hospital Barriers to Discharge: Psych Bed not available  Expected Discharge Plan and Services   Discharge Planning Services: CM Consult   Living arrangements for the past 2 months: Single Family Home Expected Discharge Date: 07/19/22                                     Social Determinants of Health ([place]) Interventions SDOH Screenings   Food Insecurity: No Food Insecurity (07/16/2022)  Housing: Low Risk  (07/16/2022)  Transportation Needs: No Transportation Needs (07/16/2022)  Utilities: Not At Risk (07/16/2022)  Depression (PHQ2-9): Low Risk  (06/08/2022)  Tobacco Use: High Risk (07/16/2022)    Readmission Risk Interventions    02/21/2021    2:20 PM  Readmission Risk Prevention Plan  Post Dischage Appt Not Complete  Appt Comments DC to SNF  Medication Screening Complete  Transportation Screening Complete

## 2022-07-23 ENCOUNTER — Encounter: Payer: Self-pay | Admitting: Psychiatry

## 2022-07-23 ENCOUNTER — Inpatient Hospital Stay
Admission: AD | Admit: 2022-07-23 | Discharge: 2022-07-24 | DRG: 882 | Disposition: A | Discharge status: Home / Self Care | Payer: Medicaid Other | Source: Intra-hospital | Attending: Psychiatry | Admitting: Psychiatry

## 2022-07-23 ENCOUNTER — Other Ambulatory Visit: Payer: Self-pay

## 2022-07-23 DIAGNOSIS — Z888 Allergy status to other drugs, medicaments and biological substances status: Secondary | ICD-10-CM | POA: Diagnosis not present

## 2022-07-23 DIAGNOSIS — S82142A Displaced bicondylar fracture of left tibia, initial encounter for closed fracture: Secondary | ICD-10-CM | POA: Diagnosis present

## 2022-07-23 DIAGNOSIS — Z8249 Family history of ischemic heart disease and other diseases of the circulatory system: Secondary | ICD-10-CM

## 2022-07-23 DIAGNOSIS — F4310 Post-traumatic stress disorder, unspecified: Secondary | ICD-10-CM | POA: Diagnosis present

## 2022-07-23 DIAGNOSIS — F322 Major depressive disorder, single episode, severe without psychotic features: Secondary | ICD-10-CM | POA: Diagnosis present

## 2022-07-23 DIAGNOSIS — M009 Pyogenic arthritis, unspecified: Secondary | ICD-10-CM | POA: Diagnosis present

## 2022-07-23 DIAGNOSIS — M8668 Other chronic osteomyelitis, other site: Secondary | ICD-10-CM | POA: Diagnosis present

## 2022-07-23 DIAGNOSIS — G8929 Other chronic pain: Secondary | ICD-10-CM | POA: Diagnosis present

## 2022-07-23 DIAGNOSIS — N63 Unspecified lump in unspecified breast: Secondary | ICD-10-CM | POA: Diagnosis present

## 2022-07-23 DIAGNOSIS — Z818 Family history of other mental and behavioral disorders: Secondary | ICD-10-CM | POA: Diagnosis not present

## 2022-07-23 DIAGNOSIS — Z841 Family history of disorders of kidney and ureter: Secondary | ICD-10-CM

## 2022-07-23 DIAGNOSIS — F419 Anxiety disorder, unspecified: Secondary | ICD-10-CM | POA: Diagnosis present

## 2022-07-23 DIAGNOSIS — T50904A Poisoning by unspecified drugs, medicaments and biological substances, undetermined, initial encounter: Secondary | ICD-10-CM | POA: Diagnosis not present

## 2022-07-23 DIAGNOSIS — F1120 Opioid dependence, uncomplicated: Secondary | ICD-10-CM | POA: Diagnosis present

## 2022-07-23 DIAGNOSIS — Z79899 Other long term (current) drug therapy: Secondary | ICD-10-CM

## 2022-07-23 DIAGNOSIS — D649 Anemia, unspecified: Secondary | ICD-10-CM | POA: Diagnosis present

## 2022-07-23 DIAGNOSIS — E876 Hypokalemia: Secondary | ICD-10-CM | POA: Diagnosis present

## 2022-07-23 DIAGNOSIS — F4325 Adjustment disorder with mixed disturbance of emotions and conduct: Principal | ICD-10-CM | POA: Diagnosis present

## 2022-07-23 DIAGNOSIS — M00062 Staphylococcal arthritis, left knee: Secondary | ICD-10-CM | POA: Diagnosis not present

## 2022-07-23 DIAGNOSIS — F17210 Nicotine dependence, cigarettes, uncomplicated: Secondary | ICD-10-CM | POA: Diagnosis present

## 2022-07-23 LAB — SARS CORONAVIRUS 2 (TAT 6-24 HRS): SARS Coronavirus 2: NEGATIVE

## 2022-07-23 MED ORDER — PREGABALIN 75 MG PO CAPS
300.0000 mg | ORAL_CAPSULE | Freq: Two times a day (BID) | ORAL | Status: DC
  Administered 2022-07-23 – 2022-07-24 (×3): 300 mg via ORAL
  Filled 2022-07-23: qty 4
  Filled 2022-07-23: qty 3
  Filled 2022-07-23 (×3): qty 4

## 2022-07-23 MED ORDER — DOXYCYCLINE HYCLATE 100 MG PO TABS
100.0000 mg | ORAL_TABLET | Freq: Two times a day (BID) | ORAL | Status: DC
  Administered 2022-07-23 – 2022-07-24 (×3): 100 mg via ORAL
  Filled 2022-07-23 (×3): qty 1

## 2022-07-23 MED ORDER — TRAZODONE HCL 100 MG PO TABS: 100.0000 mg | ORAL_TABLET | Freq: Every evening | ORAL | Status: DC | PRN

## 2022-07-23 MED ORDER — ACETAMINOPHEN 650 MG RE SUPP: 650.0000 mg | Freq: Four times a day (QID) | RECTAL | Status: DC | PRN

## 2022-07-23 MED ORDER — OXYCODONE HCL 5 MG PO TABS
10.0000 mg | ORAL_TABLET | Freq: Four times a day (QID) | ORAL | Status: DC | PRN
  Administered 2022-07-24: 10 mg via ORAL
  Filled 2022-07-23 (×2): qty 2

## 2022-07-23 MED ORDER — ALUM & MAG HYDROXIDE-SIMETH 200-200-20 MG/5ML PO SUSP: 30.0000 mL | ORAL | Status: DC | PRN

## 2022-07-23 MED ORDER — OXYCODONE HCL 5 MG PO TABS
5.0000 mg | ORAL_TABLET | Freq: Four times a day (QID) | ORAL | Status: DC | PRN
  Administered 2022-07-23: 5 mg via ORAL
  Filled 2022-07-23: qty 1

## 2022-07-23 MED ORDER — MAGNESIUM HYDROXIDE 400 MG/5ML PO SUSP: 30.0000 mL | Freq: Every day | ORAL | Status: DC | PRN

## 2022-07-23 MED ORDER — OLANZAPINE 10 MG PO TABS: 10.0000 mg | ORAL_TABLET | Freq: Three times a day (TID) | ORAL | Status: DC | PRN

## 2022-07-23 MED ORDER — ACETAMINOPHEN 325 MG PO TABS
650.0000 mg | ORAL_TABLET | Freq: Four times a day (QID) | ORAL | Status: DC | PRN
  Filled 2022-07-23: qty 2

## 2022-07-23 MED ORDER — LORAZEPAM 1 MG PO TABS
1.0000 mg | ORAL_TABLET | ORAL | Status: DC | PRN
  Administered 2022-07-23 – 2022-07-24 (×3): 1 mg via ORAL
  Filled 2022-07-23 (×3): qty 1
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
## 2022-07-23 NOTE — TOC Progression Note (Addendum)
Transition of Care [facility]) - Progression Note    Patient Details  Name: Kerry Young MRN: [id number] Date of Birth: 1966-04-25  Transition of Care [facility]) CM/SW [place], RN Phone Number:[phone]  07/23/2022, 11:37 AM  Clinical Narrative:    IVC forms were completed but after review with [facility] supervisor it was determined that IVC forms were signed in the wrong place per [facility] officer. Current forms are not acceptable. CM has completed new IVC paperwork and faxed to the magistrates office. Will await return of paperwork in order to arrange serve and transport.   1300 IVC findings of and custody forms have been received from the magistrates office. CM attempted to call for serve and transport. Per dispatcher CM will have to call for IVC papers to be served first and then call the [place] department because this is an out of county transport. CM has called  [phone] to arrange for IVC papers to be served. CM will follow and arrange transport.   1340 IVC forms faxed to [employer] [phone]  1344 Transportation has been arranged with [facility]. [phone]  Expected Discharge Plan: Psychiatric Hospital Barriers to Discharge: Psych Bed not available  Expected Discharge Plan and Services   Discharge Planning Services: CM Consult   Living arrangements for the past 2 months: Single Family Home Expected Discharge Date: 07/19/22                                     Social Determinants of Health ([place]) Interventions SDOH Screenings   Food Insecurity: No Food Insecurity (07/16/2022)  Housing: Low Risk  (07/16/2022)  Transportation Needs: No Transportation Needs (07/16/2022)  Utilities: Not At Risk (07/16/2022)  Depression (PHQ2-9): Low Risk  (06/08/2022)  Tobacco Use: High Risk (07/16/2022)    Readmission Risk Interventions    02/21/2021    2:20 PM  Readmission Risk Prevention Plan  Post Dischage Appt Not Complete  Appt Comments DC to SNF   Medication Screening Complete  Transportation Screening Complete

## 2022-07-23 NOTE — BHH Group Notes (Signed)
[place] Group Notes:  (Nursing/MHT/Case Management/Adjunct)  Date:  07/23/2022  Time:  8:45 PM  Type of Therapy:   Wrap up  Participation Level:  Did Not Attend   Summary of Progress/Problems:  Kerry Young 07/23/2022, 8:45 PM

## 2022-07-23 NOTE — BH Assessment (Signed)
Patient is to be admitted to [facility] today 07/23/22 by Dr. Weber Cooks.  Attending Physician will be Dr.  Weber Cooks .   Patient has been assigned to room 325, by [facility] Charge Nurse, Bella Kennedy.    ER staff is aware of the admission:  Seth Bake, Patient Access.
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
## 2022-07-23 NOTE — Progress Notes (Signed)
Admission note:  Consents signed (refused Restrictive Procedures Acknowledgment Form) , handbook detailing the patient's rights, responsibilities, and visitor guidelines provided. Skin search completed with no contraband found. Patient was oriented to her room and call light. She is non weight bearing on right leg. She was instructed to call staff before attempting to get out of bed. PT was notified and delivered w/c to patient. She uses a bedpan only. Patient complains of chronic pain 7/10 and Oxycodone 5mg  ordered PRN Q 6 hours. Patient given the opportunity to express concerns and ask questions. She request to move to Medical or Orthopedic floor. States the bed "makes her hurt"and she's "not getting enough pain medication". Hospitalitis visited patient and increased pain medication was ordered.

## 2022-07-23 NOTE — Progress Notes (Signed)
Was called by pt RN, Ouida Sills. She stated she was concerned about pts pain level. Was not sure who the provider was or how to proceed with getting consult. Dr. Louis Meckel was listed and gave her information for Dr. Louis Meckel to contact hospitalist. Dr. Louis Meckel secure chatted this was not his patient and thought it was Dr. Weber Cooks. Communicated concerns to Dr. Algie Coffer. Was told by Ouida Sills RN pt was complaining of pain from her old orthopedic injuries and was having difficulty ambulating. Pt had been provided with wheelchair but was having difficulties with transfer. RN stated she was concerned about patient being a fall risk. Also stated patient was crying from pain and stated the bed was too uncomfortable to lay in. Dr. Weber Cooks was communicated with about concerns. Dr. Weber Cooks called around [phone]. Asked him to consider consulting the hospitalist for pain management orders. Stated he would contact them. Spoke with Jana Half. She stated she had also spoken with Dr. Weber Cooks. She was given information on how to proceed if orders were placed for patient to transfer to an inpatient area.

## 2022-07-23 NOTE — Progress Notes (Signed)
At 2244 called [facility] of [facility] to inquire what paper works they need  and they said they only need the IVC form and papers from the hospital.   At 2247, called [place] office at [phone] to book transportation for patient but call went to voicemail, left call back number, updated Charge Nurse Steph and [place].

## 2022-07-23 NOTE — Plan of Care (Signed)
  Problem: Clinical Measurements: Goal: Will remain free from infection Outcome: Not Progressing   Problem: Coping: Goal: Level of anxiety will decrease Outcome: Not Progressing   Problem: Safety: Goal: Ability to remain free from injury will improve Outcome: Not Progressing   

## 2022-07-23 NOTE — Progress Notes (Signed)
Patient calm and pleasant during assessment denying SI/HI/AVH. Pt compliant with medication administration per MD orders. Pt given education, support, and encouragement to be active in her treatment plan. Pt being monitored Q 15 minutes for safety per unit protocol, remains safe on the unit
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
## 2022-07-23 NOTE — Plan of Care (Signed)
  Problem: Education: Goal: Knowledge of General Education information will improve Description: Including pain rating scale, medication(s)/side effects and non-pharmacologic comfort measures Outcome: Not Progressing   Problem: Health Behavior/Discharge Planning: Goal: Ability to manage health-related needs will improve Outcome: Not Progressing   Problem: Clinical Measurements: Goal: Ability to maintain clinical measurements within normal limits will improve Outcome: Not Progressing Goal: Will remain free from infection Outcome: Not Progressing Goal: Diagnostic test results will improve Outcome: Not Progressing Goal: Respiratory complications will improve Outcome: Not Progressing Goal: Cardiovascular complication will be avoided Outcome: Not Progressing   Problem: Activity: Goal: Risk for activity intolerance will decrease Outcome: Not Progressing   Problem: Nutrition: Goal: Adequate nutrition will be maintained Outcome: Not Progressing   Problem: Coping: Goal: Level of anxiety will decrease Outcome: Not Progressing   Problem: Elimination: Goal: Will not experience complications related to bowel motility Outcome: Not Progressing Goal: Will not experience complications related to urinary retention Outcome: Not Progressing   Problem: Pain Managment: Goal: General experience of comfort will improve Outcome: Not Progressing   Problem: Safety: Goal: Ability to remain free from injury will improve Outcome: Not Progressing   Problem: Skin Integrity: Goal: Risk for impaired skin integrity will decrease Outcome: Not Progressing   Problem: Education: Goal: Knowledge of  General Education information/materials will improve Outcome: Not Progressing Goal: Emotional status will improve Outcome: Not Progressing Goal: Mental status will improve Outcome: Not Progressing Goal: Verbalization of understanding the information provided will improve Outcome: Not  Progressing   Problem: Activity: Goal: Interest or engagement in activities will improve Outcome: Not Progressing Goal: Sleeping patterns will improve Outcome: Not Progressing   Problem: Coping: Goal: Ability to verbalize frustrations and anger appropriately will improve Outcome: Not Progressing Goal: Ability to demonstrate self-control will improve Outcome: Not Progressing   Problem: Health Behavior/Discharge Planning: Goal: Identification of resources available to assist in meeting health care needs will improve Outcome: Not Progressing Goal: Compliance with treatment plan for underlying cause of condition will improve Outcome: Not Progressing   Problem: Physical Regulation: Goal: Ability to maintain clinical measurements within normal limits will improve Outcome: Not Progressing   Problem: Safety: Goal: Periods of time without injury will increase Outcome: Not Progressing   

## 2022-07-24 DIAGNOSIS — F4325 Adjustment disorder with mixed disturbance of emotions and conduct: Secondary | ICD-10-CM

## 2022-07-24 DIAGNOSIS — F322 Major depressive disorder, single episode, severe without psychotic features: Secondary | ICD-10-CM

## 2022-07-24 MED ORDER — PREGABALIN 300 MG PO CAPS: 300.0000 mg | ORAL_CAPSULE | Freq: Two times a day (BID) | ORAL | 0 refills | Status: AC

## 2022-07-24 MED ORDER — OXYCODONE HCL 5 MG PO TABS
10.0000 mg | ORAL_TABLET | ORAL | Status: DC | PRN
  Administered 2022-07-24: 10 mg via ORAL
  Filled 2022-07-24: qty 2
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
## 2022-07-24 NOTE — H&P (Signed)
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  57 year old woman transferred to [place] from [place].  She had presented there several days ago with altered mental state due to overtaking prescription medicine.  Initially it was unclear whether this had been intentional or not.  Once the patient was able to give history she completely denied any suicidal intent.  She has consistently told the same story that she was having excessive pain and nausea symptoms and took extra medication not knowing that it might be too much.  Patient reports chronic mild depression but not hopelessness.  Denies any suicidal thoughts.  Anxiety but has good control of it.  Chronic difficulty with sleep.  Appetite stable.  No psychotic symptoms.  Patient says she has tried to get off of psychiatric medicine because of a previous dependence on alprazolam and is now talking to her therapist regularly.  Not interested in any current psychiatric medicine.  No evidence of any acute dangerousness to self.  Able to articulate an appropriate plan for self-care and care of her medical issues.  No evidence of overuse of prescription medicine Associated Signs/Symptoms: Depression Symptoms:  anxiety, (Hypo) Manic Symptoms:    None reported Anxiety Symptoms:   None reported Psychotic Symptoms:   None PTSD Symptoms: Has had a past diagnosis of PTSD but is not reporting acute disabling symptoms. Total Time spent with patient: 45 minutes  Past Psychiatric History: Past history of chronic treatment with mental health providers.  Previous iatrogenic dependence on alprazolam now discontinued.  Chronic pain issues.  Denies any past suicide attempts.  Is the patient at risk to self? No.  Has the patient been a risk to self in the past 6 months? No.  Has the patient been a risk to self within the distant past? No.  Is the patient a risk to others? No.  Has the patient been a risk to others in the past 6 months? No.  Has the patient been a risk to others within the distant past? No.   [place] Scale:  [facility] Admission (Current) from 07/23/2022 in [facility] ED to Hosp-Admission (Discharged) from 07/16/2022 in [facility] Admission (Discharged) from 03/25/2022 in [place] No Risk No Risk No Risk        Prior Inpatient Therapy: No. If yes, describe none Prior Outpatient Therapy: Yes.   If yes, describe outpatient mental health providers primarily therapist.  Alcohol Screening: Patient refused Alcohol Screening Tool: Yes 1. How often do you have a drink containing alcohol?: Never 2. How many drinks containing alcohol do you have on a typical day when you are drinking?: 1 or 2 3. How often do you have six or more drinks on one occasion?: Never AUDIT-C Score: 0 4. How often during the last year have  you found that you were not able to stop drinking once you had started?: Never 5. How often during the last year have you failed to do what was normally expected from you because of drinking?: Never 6. How often during the last year have you needed a first drink in the morning to get yourself going after a heavy drinking session?: Never 7.  How often during the last year have you had a feeling of guilt of remorse after drinking?: Never 8. How often during the last year have you been unable to remember what happened the night before because you had been drinking?: Never 9. Have you or someone else been injured as a result of your drinking?: No 10. Has a relative or friend or a doctor or another health worker been concerned about your drinking or suggested you cut down?: No Alcohol Use Disorder Identification Test Final Score (AUDIT): 0 Alcohol Brief Interventions/Follow-up: Alcohol education/Brief advice Substance Abuse History in the last 12 months:  No. Consequences of Substance Abuse: Negative Previous Psychotropic Medications: Yes  Psychological Evaluations: Yes  Past Medical History:  Past Medical History:  Diagnosis Date   Anxiety    Arthritis    Migraines    PONV (postoperative nausea and vomiting)    PTSD (post-traumatic stress disorder)    Sciatica    UTI (urinary tract infection)     Past Surgical History:  Procedure Laterality Date   COLPOSCOPY N/A 10/26/2019   Procedure: COLPOSCOPY with cervical biopsy;  Surgeon: Janyth Pupa, DO;  Location: [facility];  Service: Gynecology;  Laterality: N/A;   EXTERNAL FIXATION LEG Left 02/18/2022   Procedure: EXTERNAL FIXATION LEG;  Surgeon: Shona Needles, MD;  Location: [facility];  Service: Orthopedics;  Laterality: Left;   EXTERNAL FIXATION REMOVAL Left 03/25/2022   Procedure: REMOVAL EXTERNAL FIXATION LEG;  Surgeon: Shona Needles, MD;  Location: [facility];  Service: Orthopedics;  Laterality: Left;   I & D EXTREMITY Left 02/13/2022   Procedure: IRRIGATION AND DEBRIDEMENT EXTREMITY;  Surgeon: Shona Needles, MD;  Location: [facility];  Service: Orthopedics;  Laterality: Left;   ORIF HUMERUS FRACTURE Right 02/07/2021   Procedure: Arthroscopic repair rotator cuff and fixation of PROXIMAL HUMERUS FRACTURE;  Surgeon: Hiram Gash, MD;  Location: [facility];  Service: Orthopedics;   Laterality: Right;   ORIF TIBIA PLATEAU Left 01/28/2022   Procedure: OPEN REDUCTION INTERNAL FIXATION (ORIF) TIBIAL PLATEAU;  Surgeon: Shona Needles, MD;  Location: [facility];  Service: Orthopedics;  Laterality: Left;   ORIF ULNAR FRACTURE Left 02/18/2021   Procedure: OPEN REDUCTION INTERNAL FIXATION (ORIF) ULNAR FRACTURE;  Surgeon: Renette Butters, MD;  Location: [facility];  Service: Orthopedics;  Laterality: Left;   TIBIA IM NAIL INSERTION Right 02/05/2021   Procedure: INTRAMEDULLARY (IM) NAIL TIBIAL;  Surgeon: Hiram Gash, MD;  Location: [facility];  Service: Orthopedics;  Laterality: Right;   WISDOM TOOTH EXTRACTION     Family History:  Family History  Problem Relation Age of Onset   Anxiety disorder Mother    Hypertension Mother    Kidney failure Mother    Heart attack Paternal Grandfather    Healthy Daughter    Family Psychiatric  History: Anxiety and depression in mother Tobacco Screening:  Social History   Tobacco Use  Smoking Status Every Day   Packs/day: 0.50   Years: 25.00   Total pack years: 12.50   Types: Cigarettes   Last attempt to quit: 07/11/2018   Years since quitting:  4.0  Smokeless Tobacco Never    BH Tobacco Counseling     Are you interested in Tobacco Cessation Medications?  No value filed. Counseled patient on smoking cessation:  No value filed. Reason Tobacco Screening Not Completed: No value filed.       Social History:  Social History   Substance and Sexual Activity  Alcohol Use Not Currently   Comment: 1 per year maybe.     Social History   Substance and Sexual Activity  Drug Use No    Additional Social History:                           Allergies:   Allergies  Allergen Reactions   Amitriptyline Other (See Comments)    Cold sores on mouth, blister on tongue. No facial swelling    Celexa [Citalopram] Nausea And Vomiting and Other (See Comments)    Took it 2 days. Had n/v and migraine.   Methocarbamol Nausea Only   Naproxen  Nausea Only   Other Nausea And Vomiting and Other (See Comments)    "ALL ANTIBIOTICS"   Zofran [Ondansetron] Other (See Comments)    Headaches    Lab Results:  Results for orders placed or performed during the hospital encounter of 07/16/22 (from the past 48 hour(s))  Urinalysis, Routine w reflex microscopic Urine, Clean Catch     Status: Abnormal   Collection Time: 07/22/22 10:36 PM  Result Value Ref Range   Color, Urine YELLOW YELLOW   APPearance CLOUDY (A) CLEAR   Specific Gravity, Urine 1.019 1.005 - 1.030   pH 6.0 5.0 - 8.0   Glucose, UA NEGATIVE NEGATIVE mg/dL   Hgb urine dipstick NEGATIVE NEGATIVE   Bilirubin Urine NEGATIVE NEGATIVE   Ketones, ur NEGATIVE NEGATIVE mg/dL   Protein, ur NEGATIVE NEGATIVE mg/dL   Nitrite NEGATIVE NEGATIVE   Leukocytes,Ua NEGATIVE NEGATIVE    Comment: Performed at [facility], [address], [place], [place] [address]    Blood Alcohol level:  Lab Results  Component Value Date   ETH <10 07/16/2022   ETH <10 02/04/2021    Metabolic Disorder Labs:  No results found for: "HGBA1C", "MPG" No results found for: "PROLACTIN" No results found for: "CHOL", "TRIG", "HDL", "CHOLHDL", "VLDL", "LDLCALC"  Current Medications: Current Facility-Administered Medications  Medication Dose Route Frequency Provider Last Rate Last Admin   acetaminophen (TYLENOL) tablet 650 mg  650 mg Oral Q6H PRN Sarina Ill, DO       Or   acetaminophen (TYLENOL) suppository 650 mg  650 mg Rectal Q6H PRN Sarina Ill, DO       alum & mag hydroxide-simeth (MAALOX/MYLANTA) 200-200-20 MG/5ML suspension 30 mL  30 mL Oral Q4H PRN Sarina Ill, DO       doxycycline (VIBRA-TABS) tablet 100 mg  100 mg Oral BID Sarina Ill, DO   100 mg at 07/24/22 6294   LORazepam (ATIVAN) tablet 1 mg  1 mg Oral Q4H PRN Sarina Ill, DO   1 mg at 07/24/22 7654   magnesium hydroxide (MILK OF MAGNESIA) suspension 30 mL  30 mL  Oral Daily PRN Sarina Ill, DO       OLANZapine [facility]) tablet 10 mg  10 mg Oral Q8H PRN Sarina Ill, DO       oxyCODONE (Oxy IR/ROXICODONE) immediate release tablet 10 mg  10 mg Oral Q4H PRN Wouk, Wilfred Curtis, MD  pregabalin (LYRICA) capsule 300 mg  300 mg Oral BID Sarina Ill, DO   300 mg at 07/24/22 5329   traZODone (DESYREL) tablet 100 mg  100 mg Oral QHS PRN Sarina Ill, DO       PTA Medications: Medications Prior to Admission  Medication Sig Dispense Refill Last Dose   acetaminophen (TYLENOL) 500 MG tablet Take 2 tablets (1,000 mg total) by mouth every 6 (six) hours as needed. (Patient not taking: Reported on 07/16/2022) 100 tablet 2    doxycycline (VIBRA-TABS) 100 MG tablet Take 1 tablet (100 mg total) by mouth 2 (two) times daily. 60 tablet 0    oxyCODONE (OXY IR/ROXICODONE) 5 MG immediate release tablet Take 1 tablet (5 mg total) by mouth every 6 (six) hours as needed for severe pain.      pregabalin (LYRICA) 300 MG capsule Take 300 mg by mouth 2 (two) times daily.       Musculoskeletal: Strength & Muscle Tone: decreased Gait & Station: unable to stand Patient leans: N/A            Psychiatric Specialty Exam:  Presentation  General Appearance: No data recorded Eye Contact:No data recorded Speech:No data recorded Speech Volume:No data recorded Handedness:No data recorded  Mood and Affect  Mood:No data recorded Affect:No data recorded  Thought Process  Thought Processes:No data recorded Duration of Psychotic Symptoms: Chronic mild intermittent Past Diagnosis of Schizophrenia or Psychoactive disorder: No data recorded Descriptions of Associations:No data recorded Orientation:No data recorded Thought Content:No data recorded Hallucinations:No data recorded Ideas of Reference:No data recorded Suicidal Thoughts:No data recorded Homicidal Thoughts:No data recorded  Sensorium  Memory:No data  recorded Judgment:No data recorded Insight:No data recorded  Executive Functions  Concentration:No data recorded Attention Span:No data recorded Recall:No data recorded Fund of Knowledge:No data recorded Language:No data recorded  Psychomotor Activity  Psychomotor Activity:No data recorded  Assets  Assets:No data recorded  Sleep  Sleep:No data recorded   Physical Exam: Physical Exam Vitals and nursing note reviewed.  Constitutional:      Appearance: Normal appearance.  HENT:     Head: Normocephalic and atraumatic.     Mouth/Throat:     Pharynx: Oropharynx is clear.  Eyes:     Pupils: Pupils are equal, round, and reactive to light.  Cardiovascular:     Rate and Rhythm: Normal rate and regular rhythm.  Pulmonary:     Effort: Pulmonary effort is normal.     Breath sounds: Normal breath sounds.  Abdominal:     General: Abdomen is flat.     Palpations: Abdomen is soft.  Musculoskeletal:        General: Normal range of motion.       Legs:  Skin:    General: Skin is warm and dry.  Neurological:     General: No focal deficit present.     Mental Status: She is alert. Mental status is at baseline.  Psychiatric:        Attention and Perception: Attention normal.        Mood and Affect: Mood normal.        Speech: Speech normal.        Behavior: Behavior is cooperative.        Thought Content: Thought content normal.        Cognition and Memory: Cognition normal.        Judgment: Judgment normal.    Review of Systems  Constitutional: Negative.   HENT: Negative.    Eyes: Negative.  Respiratory: Negative.    Cardiovascular: Negative.   Gastrointestinal: Negative.   Musculoskeletal:  Positive for joint pain.  Skin: Negative.   Neurological: Negative.   Psychiatric/Behavioral: Negative.     Blood pressure 124/87, pulse (!) 104, temperature 98.7 F (37.1 C), resp. rate 18, height 5\' 2"  (1.575 m), weight 95.3 kg, SpO2 97 %. Body mass index is 38.41  kg/m.  Treatment Plan Summary: Plan patient at this point does not appear to have acute symptoms that would require treatment inpatient.  She denies any suicidal thought and has not shown any dangerous behavior.  She is lucid and shows good insight about her medical issues and is able to articulate an appropriate plan for her medical care.  Patient is requesting discharge.  Discontinue IVC.  Psychoeducation about available treatment for anxiety and depression.  Encourage patient to follow-up with therapist.  Discharge home at this point to her usual outpatient follow-up.  Observation Level/Precautions:  15 minute checks  Laboratory:  Chemistry Profile  Psychotherapy:    Medications:    Consultations:    Discharge Concerns:    Estimated LOS:  Other:     Physician Treatment Plan for Primary Diagnosis: Adjustment disorder with mixed disturbance of emotions and conduct Long Term Goal(s): Improvement in symptoms so as ready for discharge  Short Term Goals: Ability to verbalize feelings will improve  Physician Treatment Plan for Secondary Diagnosis: Principal Problem:   Adjustment disorder with mixed disturbance of emotions and conduct Active Problems:   Closed fracture of left tibial plateau   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    , MD 1/5/202412:39 PM

## 2022-07-24 NOTE — Plan of Care (Signed)
  Problem: Nutrition: Goal: Adequate nutrition will be maintained Outcome: Progressing   Problem: Coping: Goal: Level of anxiety will decrease Outcome: Progressing   Problem: Pain Managment: Goal: General experience of comfort will improve Outcome: Progressing   

## 2022-07-24 NOTE — Progress Notes (Signed)
Pt denies SI/HI/AVH and verbally agrees to approach staff if these become apparent or before harming themselves/others. Rates depression 0/10. Rates anxiety 5.5/10. Rates pain 7/10.  Scheduled medications administered to pt, per MD orders. RN provided support and encouragement to pt. Q15 min safety checks implemented and continued. Pt safe on the unit. RN will continue to monitor and intervene as needed.  07/24/22 0832  Psych Admission Type (Psych Patients Only)  Admission Status Involuntary  Psychosocial Assessment  Patient Complaints Anxiety  Eye Contact Fair  Facial Expression Anxious  Affect Anxious  Speech Logical/coherent  Interaction Assertive  Motor Activity Restless  Appearance/Hygiene In scrubs  Behavior Characteristics Cooperative;Appropriate to situation;Anxious  Mood Anxious;Pleasant  Thought Process  Coherency WDL  Content WDL  Delusions None reported or observed  Perception WDL  Hallucination None reported or observed  Judgment Impaired  Confusion None  Danger to Self  Current suicidal ideation? Denies  Danger to Others  Danger to Others None reported or observed

## 2022-07-24 NOTE — BHH Counselor (Signed)
Adult Comprehensive Assessment  Patient ID: Kerry Young, female   DOB: February 22, 1966, 57 y.o.   MRN: [id number]  Information Source: Information source: Patient (Chart review)  Current Stressors:  Patient states their primary concerns and needs for treatment are:: Pt presented to the hospital due to altered mental status in the context of overtaking prescription medication. Patient states their goals for this hospitilization and ongoing recovery are:: "I just need to work on my anxiety, have some tools to take with me."  Living/Environment/Situation:  Living Arrangements: Spouse/significant other  Family History:     Childhood History:     Education:     Employment/Work Situation:      Pensions consultant:      Alcohol/Substance Abuse:      Social Support System:      Leisure/Recreation:      Strengths/Needs:      Discharge Plan:      Summary/Recommendations:   Architectural technologist and Recommendations (to be completed by the evaluator): Patient is a 57 year old, [facility] [place] [place] [facility] She arrived on our unit from [place] where she had spent a few days in the hospital due to altered mental status in the context of overtaking prescription medications. During treatment team meeting she shared that she wants to work on her anxiety and getting tools to help with this on the outside. Pt denied any current involvement with mental health services but is interested in being connected with them after discharge. As pt is discharging today, it is recommended that she follow through with all aftercare appointments and take medications as prescribed.  Shirl Harris. 07/24/2022

## 2022-07-24 NOTE — Progress Notes (Signed)
  [facility] Adult Case Management Discharge Plan :  Will you be returning to the same living situation after discharge:  Yes,  pt reports that she is going to her mothers home At discharge, do you have transportation home?: Yes,  CSW has arranged transportation with her Medicaid Do you have the ability to pay for your medications: Yes,  [place] MEDICAID [facility] COMMUNITY  Release of information consent forms completed and in the chart;  Patient's signature needed at discharge.  Patient to Follow up at:  Follow-up Information     [facility] Follow up.   Why: Your appointment is scheduled for Friday, 07/31/22 at [facility]. This appointment is a telehealth appointment and they will be reaching out to you at [phone]. Thanks! Contact information: [place]  [place]  [address] [phone]                 Next level of care provider has access to [place] and Suicide Prevention discussed: Yes,  SPE completed with the patient.     Has patient been referred to the Quitline?: Patient refused referral  Patient has been referred for addiction treatment: Pt. refused referral  Rozann Lesches, LCSW 07/24/2022, 3:29 PM

## 2022-07-24 NOTE — Group Note (Signed)
[facility] LCSW Group Therapy Note   Group Date: 07/24/2022 Start Time: 1300 End Time: 1400  Type of Therapy and Topic:  Group Therapy:  Feelings around Relapse and Recovery  Participation Level:  Did Not Attend    Description of Group:    Patients in this group will discuss emotions they experience before and after a relapse. They will process how experiencing these feelings, or avoidance of experiencing them, relates to having a relapse. Facilitator will guide patients to explore emotions they have related to recovery. Patients will be encouraged to process which emotions are more powerful. They will be guided to discuss the emotional reaction significant others in their lives may have to patients' relapse or recovery. Patients will be assisted in exploring ways to respond to the emotions of others without this contributing to a relapse.  Therapeutic Goals: Patient will identify two or more emotions that lead to relapse for them:  Patient will identify two emotions that result when they relapse:  Patient will identify two emotions related to recovery:  Patient will demonstrate ability to communicate their needs through discussion and/or role plays.   Summary of Patient Progress: X   Therapeutic Modalities:   Cognitive Behavioral Therapy Solution-Focused Therapy Assertiveness Training Relapse Prevention Therapy   Vijay Durflinger R Isreal Moline, LCSW 

## 2022-07-24 NOTE — [facility] Suicide Risk Assessment (Signed)
[facility] INPATIENT:  Family/Significant Other Suicide Prevention Education  Suicide Prevention Education:  Patient Refusal for Family/Significant Other Suicide Prevention Education: The patient Kerry Young has refused to provide written consent for family/significant other to be provided Family/Significant Other Suicide Prevention Education during admission and/or prior to discharge.  Physician notified.  SPE completed with pt, as pt refused to consent to family contact. SPI pamphlet provided to pt and pt was encouraged to share information with support network, ask questions, and talk about any concerns relating to SPE. Pt denies access to guns/firearms and verbalized understanding of information provided. Mobile Crisis information also provided to pt.  Shirl Harris 07/24/2022, 4:03 PM

## 2022-07-24 NOTE — BHH Suicide Risk Assessment (Signed)
[facility] Discharge Suicide Risk Assessment   Principal Problem: Adjustment disorder with mixed disturbance of emotions and conduct Discharge Diagnoses: Principal Problem:   Adjustment disorder with mixed disturbance of emotions and conduct Active Problems:   Closed fracture of left tibial plateau   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   Normocytic anemia   Hypokalemia   Breast nodule   Anxiety   Opioid type dependence, continuous (HCC)   Major depressive disorder, single episode, severe without psychosis ([place])   Total Time spent with patient: 45 minutes  Musculoskeletal: Strength & Muscle Tone: decreased Gait & Station: unable to stand Patient leans: N/A  Psychiatric Specialty Exam  Presentation  General Appearance: No data recorded Eye Contact:No data recorded Speech:No data recorded Speech Volume:No data recorded Handedness:No data recorded  Mood and Affect  Mood:No data recorded Duration of Depression Symptoms: No data recorded Affect:No data recorded  Thought Process  Thought Processes:No data recorded Descriptions of Associations:No data recorded Orientation:No data recorded Thought Content:No data recorded History of Schizophrenia/Schizoaffective disorder:No data recorded Duration of Psychotic Symptoms:No data recorded Hallucinations:No data recorded Ideas of Reference:No data recorded Suicidal Thoughts:No data recorded Homicidal Thoughts:No data recorded  Sensorium  Memory:No data recorded Judgment:No data recorded Insight:No data recorded  Executive Functions  Concentration:No data recorded Attention Span:No data recorded Recall:No data recorded Fund of Knowledge:No data recorded Language:No data recorded  Psychomotor Activity  Psychomotor Activity:No data recorded  Assets  Assets:No data recorded  Sleep  Sleep:No data recorded  Physical Exam: Physical Exam Vitals and nursing note reviewed.  Constitutional:       Appearance: Normal appearance.  HENT:     Head: Normocephalic and atraumatic.     Mouth/Throat:     Pharynx: Oropharynx is clear.  Eyes:     Pupils: Pupils are equal, round, and reactive to light.  Cardiovascular:     Rate and Rhythm: Normal rate and regular rhythm.  Pulmonary:     Effort: Pulmonary effort is normal.     Breath sounds: Normal breath sounds.  Abdominal:     General: Abdomen is flat.     Palpations: Abdomen is soft.  Musculoskeletal:        General: Normal range of motion.       Legs:  Skin:    General: Skin is warm and dry.  Neurological:     General: No focal deficit present.     Mental Status: She is alert. Mental status is at baseline.  Psychiatric:        Attention and Perception: Attention normal.        Mood and Affect: Mood normal.        Speech: Speech normal.        Behavior: Behavior normal.        Thought Content: Thought content normal.        Cognition and Memory: Cognition normal.    Review of Systems  Constitutional: Negative.   HENT: Negative.    Eyes: Negative.   Respiratory: Negative.    Cardiovascular: Negative.   Gastrointestinal: Negative.   Musculoskeletal: Negative.   Skin: Negative.   Neurological: Negative.   Psychiatric/Behavioral: Negative.     Blood pressure 124/87, pulse (!) 104, temperature 98.7 F (37.1 C), resp. rate 18, height 5\' 2"  (1.575 m), weight 95.3 kg, SpO2 97 %. Body mass index is 38.41 kg/m.  Mental Status Per Nursing Assessment::   On Admission:  NA  Demographic Factors:  Unemployed  Loss Factors: Decline in  physical health  Historical Factors: NA  Risk Reduction Factors:   Sense of responsibility to family, Living with another person, especially a relative, Positive social support, and Positive therapeutic relationship  Continued Clinical Symptoms:  Depression:   Insomnia Medical Diagnoses and Treatments/Surgeries  Cognitive Features That Contribute To Risk:  None    Suicide Risk:   Minimal: No identifiable suicidal ideation.  Patients presenting with no risk factors but with morbid ruminations; may be classified as minimal risk based on the severity of the depressive symptoms    Plan Of Care/Follow-up recommendations:  Other:  Patient is calm with appropriate upbeat and reactive affect.  No sign of thought disorder.  Completely denies suicidal ideation.  No evidence of acute dangerousness.  Does not meet commitment criteria.  Patient agrees to outpatient therapy and continued mental health treatment as well as continued medical treatment.  Does not meet commitment criteria and can be discharged home today with outpatient follow-up.  Alethia Berthold, MD 07/24/2022, 12:35 PM
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
## 2022-07-24 NOTE — Progress Notes (Signed)
Patient transported via [facility] no distress noted,she appears clam and elated to be released.

## 2022-07-24 NOTE — BH IP Treatment Plan (Signed)
Interdisciplinary Treatment and Diagnostic Plan Update  07/24/2022 Time of Session: 09:34 Kerry Young MRN: [id number]  Principal Diagnosis: Adjustment disorder with mixed disturbance of emotions and conduct  Secondary Diagnoses: Principal Problem:   Adjustment disorder with mixed disturbance of emotions and conduct Active Problems:   Closed fracture of left tibial plateau   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   Normocytic anemia   Hypokalemia   Breast nodule   Anxiety   Opioid type dependence, continuous (HCC)   Major depressive disorder, single episode, severe without psychosis ([place])   Current Medications:  Current Facility-Administered Medications  Medication Dose Route Frequency Provider Last Rate Last Admin   acetaminophen (TYLENOL) tablet 650 mg  650 mg Oral Q6H PRN Parks Ranger, DO       Or   acetaminophen (TYLENOL) suppository 650 mg  650 mg Rectal Q6H PRN Parks Ranger, DO       alum & mag hydroxide-simeth (MAALOX/MYLANTA) 200-200-20 MG/5ML suspension 30 mL  30 mL Oral Q4H PRN Parks Ranger, DO       doxycycline (VIBRA-TABS) tablet 100 mg  100 mg Oral BID Parks Ranger, DO   100 mg at 07/24/22 6948   LORazepam (ATIVAN) tablet 1 mg  1 mg Oral Q4H PRN Parks Ranger, DO   1 mg at 07/24/22 5462   magnesium hydroxide (MILK OF MAGNESIA) suspension 30 mL  30 mL Oral Daily PRN Parks Ranger, DO       OLANZapine (ZYPREXA) tablet 10 mg  10 mg Oral Q8H PRN Parks Ranger, DO       oxyCODONE (Oxy IR/ROXICODONE) immediate release tablet 10 mg  10 mg Oral Q4H PRN Wouk, Ailene Rud, MD       pregabalin (LYRICA) capsule 300 mg  300 mg Oral BID Parks Ranger, DO   300 mg at 07/24/22 7035   traZODone (DESYREL) tablet 100 mg  100 mg Oral QHS PRN Parks Ranger, DO       PTA Medications: Medications Prior to Admission  Medication Sig Dispense Refill Last Dose   acetaminophen  (TYLENOL) 500 MG tablet Take 2 tablets (1,000 mg total) by mouth every 6 (six) hours as needed. (Patient not taking: Reported on 07/16/2022) 100 tablet 2    doxycycline (VIBRA-TABS) 100 MG tablet Take 1 tablet (100 mg total) by mouth 2 (two) times daily. 60 tablet 0    oxyCODONE (OXY IR/ROXICODONE) 5 MG immediate release tablet Take 1 tablet (5 mg total) by mouth every 6 (six) hours as needed for severe pain.      [DISCONTINUED] pregabalin (LYRICA) 300 MG capsule Take 300 mg by mouth 2 (two) times daily.       Patient Stressors: Health problems   Marital or family conflict   Substance abuse   Traumatic event    Patient Strengths: Average or above average intelligence  Communication skills  Supportive family/friends   Treatment Modalities: Medication Management, Group therapy, Case management,  1 to 1 session with clinician, Psychoeducation, Recreational therapy.   Physician Treatment Plan for Primary Diagnosis: Adjustment disorder with mixed disturbance of emotions and conduct Long Term Goal(s): Improvement in symptoms so as ready for discharge   Short Term Goals: Ability to maintain clinical measurements within normal limits will improve Ability to verbalize feelings will improve  Medication Management: Evaluate patient's response, side effects, and tolerance of medication regimen.  Therapeutic Interventions: 1 to 1 sessions, Unit Group sessions and Medication administration.  Evaluation of Outcomes: Adequate for Discharge  Physician Treatment Plan for Secondary Diagnosis: Principal Problem:   Adjustment disorder with mixed disturbance of emotions and conduct Active Problems:   Closed fracture of left tibial plateau   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   Normocytic anemia   Hypokalemia   Breast nodule   Anxiety   Opioid type dependence, continuous (HCC)   Major depressive disorder, single episode, severe without psychosis (HCC)  Long Term Goal(s):  Improvement in symptoms so as ready for discharge   Short Term Goals: Ability to maintain clinical measurements within normal limits will improve Ability to verbalize feelings will improve     Medication Management: Evaluate patient's response, side effects, and tolerance of medication regimen.  Therapeutic Interventions: 1 to 1 sessions, Unit Group sessions and Medication administration.  Evaluation of Outcomes: Adequate for Discharge   RN Treatment Plan for Primary Diagnosis: Adjustment disorder with mixed disturbance of emotions and conduct Long Term Goal(s): Knowledge of disease and therapeutic regimen to maintain health will improve  Short Term Goals: Ability to remain free from injury will improve, Ability to verbalize frustration and anger appropriately will improve, Ability to demonstrate self-control, Ability to participate in decision making will improve, Ability to verbalize feelings will improve, Ability to disclose and discuss suicidal ideas, Ability to identify and develop effective coping behaviors will improve, and Compliance with prescribed medications will improve  Medication Management: RN will administer medications as ordered by provider, will assess and evaluate patient's response and provide education to patient for prescribed medication. RN will report any adverse and/or side effects to prescribing provider.  Therapeutic Interventions: 1 on 1 counseling sessions, Psychoeducation, Medication administration, Evaluate responses to treatment, Monitor vital signs and CBGs as ordered, Perform/monitor CIWA, COWS, AIMS and Fall Risk screenings as ordered, Perform wound care treatments as ordered.  Evaluation of Outcomes: Adequate for Discharge   LCSW Treatment Plan for Primary Diagnosis: Adjustment disorder with mixed disturbance of emotions and conduct Long Term Goal(s): Safe transition to appropriate next level of care at discharge, Engage patient in therapeutic group  addressing interpersonal concerns.  Short Term Goals: Engage patient in aftercare planning with referrals and resources, Increase social support, Increase ability to appropriately verbalize feelings, Increase emotional regulation, Facilitate acceptance of mental health diagnosis and concerns, and Increase skills for wellness and recovery  Therapeutic Interventions: Assess for all discharge needs, 1 to 1 time with Social worker, Explore available resources and support systems, Assess for adequacy in community support network, Educate family and significant other(s) on suicide prevention, Complete Psychosocial Assessment, Interpersonal group therapy.  Evaluation of Outcomes: Adequate for Discharge   Progress in Treatment: Attending groups: No. Participating in groups: No. Taking medication as prescribed: Yes. Toleration medication: Yes. Family/Significant other contact made: No, will contact:  if given permission. Patient understands diagnosis: Yes. Discussing patient identified problems/goals with staff: Yes. Medical problems stabilized or resolved: Yes. Denies suicidal/homicidal ideation: Yes. Issues/concerns per patient self-inventory: No. Other: none.  New problem(s) identified: No, Describe:  none identified.  New Short Term/Long Term Goal(s): medication management for mood stabilization; elimination of SI thoughts; development of comprehensive mental wellness plan.  Patient Goals: "I just need to work on my anxiety, have some took to take with me."   Discharge Plan or Barriers: CSW will assist pt with development of an appropriate aftercare/discharge plan.   Reason for Continuation of Hospitalization: Anxiety Medication stabilization  Estimated Length of Stay: 1-7 days  Last 3 [place] Suicide Severity  Risk Score: Flowsheet Row Admission (Current) from 07/23/2022 in [place] ED to Hosp-Admission (Discharged) from 07/16/2022 in [place] Admission (Discharged) from 03/25/2022 in [place] No Risk No Risk No Risk       Last PHQ 2/9 Scores:    06/08/2022    1:36 PM  Depression screen PHQ 2/9  Decreased Interest 0  Down, Depressed, Hopeless 0  PHQ - 2 Score 0    Scribe for Treatment Team: Shirl [place], LCSW 07/24/2022 3:21 PM

## 2022-07-24 NOTE — BHH Suicide Risk Assessment (Signed)
[facility] Admission Suicide Risk Assessment   Nursing information obtained from:  Patient Demographic factors:  Caucasian, Divorced or widowed Current Mental Status:  NA Loss Factors:  Decline in physical health Historical Factors:  Family history of mental illness or substance abuse Risk Reduction Factors:  Living with another person, especially a relative  Total Time spent with patient: 45 minutes Principal Problem: Adjustment disorder with mixed disturbance of emotions and conduct Diagnosis:  Principal Problem:   Adjustment disorder with mixed disturbance of emotions and conduct Active Problems:   Closed fracture of left tibial plateau   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   Normocytic anemia   Hypokalemia   Breast nodule   Anxiety   Opioid type dependence, continuous (HCC)   Major depressive disorder, single episode, severe without psychosis ([place])  Subjective Data: Patient seen and chart reviewed.  57 year old woman with a history of multiple medical problems as well as a history of anxiety and depression who came to the hospital transferred from [facility] where she had presented several days ago with altered mental state due to medications.  Patient completely denies suicidal ideation which she has denied throughout her entire hospital visit.  Calm and appropriate and lucid no sign of acute mental illness  Continued Clinical Symptoms:  Alcohol Use Disorder Identification Test Final Score (AUDIT): 0 The "Alcohol Use Disorders Identification Test", Guidelines for Use in Primary Care, Second Edition.  World Pharmacologist [facility]). Score between 0-7:  no or low risk or alcohol related problems. Score between 8-15:  moderate risk of alcohol related problems. Score between 16-19:  high risk of alcohol related problems. Score 20 or above:  warrants further diagnostic evaluation for alcohol dependence and treatment.   CLINICAL FACTORS:   Medical Diagnoses and  Treatments/Surgeries   Musculoskeletal: Strength & Muscle Tone: decreased Gait & Station: unable to stand Patient leans: N/A  Psychiatric Specialty Exam:  Presentation  General Appearance: No data recorded Eye Contact:No data recorded Speech:No data recorded Speech Volume:No data recorded Handedness:No data recorded  Mood and Affect  Mood:No data recorded Affect:No data recorded  Thought Process  Thought Processes:No data recorded Descriptions of Associations:No data recorded Orientation:No data recorded Thought Content:No data recorded History of Schizophrenia/Schizoaffective disorder:No data recorded Duration of Psychotic Symptoms:No data recorded Hallucinations:No data recorded Ideas of Reference:No data recorded Suicidal Thoughts:No data recorded Homicidal Thoughts:No data recorded  Sensorium  Memory:No data recorded Judgment:No data recorded Insight:No data recorded  Executive Functions  Concentration:No data recorded Attention Span:No data recorded Recall:No data recorded Fund of Knowledge:No data recorded Language:No data recorded  Psychomotor Activity  Psychomotor Activity:No data recorded  Assets  Assets:No data recorded  Sleep  Sleep:No data recorded   Physical Exam: Physical Exam Vitals and nursing note reviewed.  Constitutional:      Appearance: Normal appearance.  HENT:     Head: Normocephalic and atraumatic.     Mouth/Throat:     Pharynx: Oropharynx is clear.  Eyes:     Pupils: Pupils are equal, round, and reactive to light.  Cardiovascular:     Rate and Rhythm: Normal rate and regular rhythm.  Pulmonary:     Effort: Pulmonary effort is normal.     Breath sounds: Normal breath sounds.  Abdominal:     General: Abdomen is flat.     Palpations: Abdomen is soft.  Musculoskeletal:        General: Normal range of motion.       Legs:  Skin:  General: Skin is warm and dry.  Neurological:     General: No focal deficit present.      Mental Status: She is alert. Mental status is at baseline.  Psychiatric:        Attention and Perception: Attention normal.        Mood and Affect: Mood normal.        Speech: Speech normal.        Behavior: Behavior normal.        Thought Content: Thought content normal.        Cognition and Memory: Cognition normal.        Judgment: Judgment normal.    Review of Systems  Constitutional: Negative.   HENT: Negative.    Eyes: Negative.   Respiratory: Negative.    Cardiovascular: Negative.   Gastrointestinal: Negative.   Musculoskeletal: Negative.   Skin: Negative.   Neurological: Negative.   Psychiatric/Behavioral: Negative.     Blood pressure 124/87, pulse (!) 104, temperature 98.7 F (37.1 C), resp. rate 18, height 5\' 2"  (1.575 m), weight 95.3 kg, SpO2 97 %. Body mass index is 38.41 kg/m.   COGNITIVE FEATURES THAT CONTRIBUTE TO RISK:  None    SUICIDE RISK:   Minimal: No identifiable suicidal ideation.  Patients presenting with no risk factors but with morbid ruminations; may be classified as minimal risk based on the severity of the depressive symptoms  PLAN OF CARE: Patient at this point does not have acute mental health problems requiring inpatient treatment.  There is no evidence of any current dangerousness to self or others.  Patient has outpatient treatment already in place.  Discontinue IVC.  Recommend discharged home with follow-up with all usual providers.  I certify that inpatient services furnished can reasonably be expected to improve the patient's condition.   Alethia Berthold, MD 07/24/2022, 12:33 PM

## 2022-07-24 NOTE — Progress Notes (Signed)
Recreation Therapy Notes  Date: 07/24/2022  Time: 10:50 am  Location: Craft room     Behavioral response: N/A   Intervention Topic: Life Planning    Discussion/Intervention: Patient refused to attend group.   Clinical Observations/Feedback:  Patient refused to attend group.    Olajuwon Fosdick LRT/CTRS        Gracelynn Bircher 07/24/2022 1:02 PM

## 2022-07-26 ENCOUNTER — Other Ambulatory Visit: Payer: Self-pay | Admitting: Infectious Disease

## 2022-07-26 DIAGNOSIS — M86662 Other chronic osteomyelitis, left tibia and fibula: Secondary | ICD-10-CM

## 2022-07-27 ENCOUNTER — Ambulatory Visit (INDEPENDENT_AMBULATORY_CARE_PROVIDER_SITE_OTHER): Payer: Self-pay | Admitting: Physician Assistant

## 2022-07-27 DIAGNOSIS — Z91199 Patient's noncompliance with other medical treatment and regimen due to unspecified reason: Secondary | ICD-10-CM

## 2022-07-27 NOTE — Progress Notes (Signed)
No show

## 2022-08-05 ENCOUNTER — Ambulatory Visit: Payer: Medicaid Other | Admitting: Infectious Disease
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
## 2022-11-02 ENCOUNTER — Other Ambulatory Visit (HOSPITAL_COMMUNITY): Payer: Self-pay

## 2022-11-08 DIAGNOSIS — Z5321 Procedure and treatment not carried out due to patient leaving prior to being seen by health care provider: Secondary | ICD-10-CM | POA: Diagnosis not present

## 2022-11-08 DIAGNOSIS — G8929 Other chronic pain: Secondary | ICD-10-CM | POA: Insufficient documentation

## 2022-11-08 DIAGNOSIS — M25562 Pain in left knee: Secondary | ICD-10-CM | POA: Insufficient documentation

## 2022-11-08 DIAGNOSIS — M25462 Effusion, left knee: Secondary | ICD-10-CM | POA: Diagnosis not present

## 2022-11-09 ENCOUNTER — Emergency Department (HOSPITAL_COMMUNITY): Payer: Medicaid Other

## 2022-11-09 ENCOUNTER — Encounter (HOSPITAL_COMMUNITY): Payer: Self-pay | Admitting: *Deleted

## 2022-11-09 ENCOUNTER — Other Ambulatory Visit: Payer: Self-pay

## 2022-11-09 ENCOUNTER — Emergency Department (HOSPITAL_COMMUNITY)
Admission: EM | Admit: 2022-11-09 | Discharge: 2022-11-09 | Discharge status: Against Medical Advice | Payer: Medicaid Other | Attending: Emergency Medicine | Admitting: Emergency Medicine

## 2022-11-09 MED ORDER — HYDROMORPHONE HCL 1 MG/ML IJ SOLN
1.0000 mg | Freq: Once | INTRAMUSCULAR | Status: AC
  Administered 2022-11-09: 1 mg via INTRAVENOUS
  Filled 2022-11-09: qty 1

## 2022-11-09 MED ORDER — KETOROLAC TROMETHAMINE 15 MG/ML IJ SOLN
15.0000 mg | Freq: Once | INTRAMUSCULAR | Status: AC
  Administered 2022-11-09: 15 mg via INTRAVENOUS
  Filled 2022-11-09: qty 1

## 2022-11-09 NOTE — ED Triage Notes (Signed)
The pt arrived by [facility] from home pt c/o ltknee pain forone monthshe takes vicodin 4 times a day but that has not helped her pain  iv per ems they gave her fentanyl100mg m iv on the way here

## 2022-11-09 NOTE — ED Provider Triage Note (Signed)
Emergency Medicine Provider Triage Evaluation Note  Kerry Young , a 57 y.o. female  was evaluated in triage.  Pt complains of progressive L knee pain. Has chronic pain in L knee at baseline which has been worsening x 1 month. Reports taking hydrocodone q6hours as well as Lyrica BID w/o relief. Pain sharp and stabbing, radiating to L foot. Aggravated by movement with associated pins and needles sensation in the LLE. Numbness/paresthesias unchanged from baseline. No associated fevers, joint redness, trauma.  Review of Systems  Positive: As above Negative: As above  Physical Exam  BP (!) 143/92 (BP Location: Right Arm)   Pulse 100   Temp 98 F (36.7 C)   Resp 20   SpO2 94%  Gen:   Awake, tearful, hyperventilating Resp:  Normal effort  MSK:   Swelling of the L knee without erythema, heat to touch, lymphangitic streaking. Exam limited in triage and due to patient cooperation.  Other:  DP pulse 2+ in the LLE  Medical Decision Making  Medically screening exam initiated at 12:08 AM.  Appropriate orders placed.  TABBY BEASTON was informed that the remainder of the evaluation will be completed by another provider, this initial triage assessment does not replace that evaluation, and the importance of remaining in the ED until their evaluation is complete.  Patient considered opiate tolerant. Will give IV Toradol and Dilaudid pending room placement for pain.   Antony Madura, PA-C 11/09/22 0012

## 2022-11-09 NOTE — ED Notes (Signed)
Called pts name 2x. Triage tech stated the patient has been gone a while because she removed her IV earlier.

## 2022-11-12 ENCOUNTER — Telehealth: Payer: Self-pay

## 2022-11-12 NOTE — Telephone Encounter (Signed)
Received a call from stating that Dr. [name] wanted to remove a metal plate in her leg and he wanted her to be off abx for 2 months which she has done. She stated that Dr. Gilman Buttner at [facility] did a culture on her knee and told her that she had some bacteria but not to worry about it. She wanted to know should she be worried and need to go back on her abx.

## 2022-11-17 ENCOUNTER — Telehealth: Payer: Self-pay | Admitting: Infectious Disease

## 2022-11-17 ENCOUNTER — Other Ambulatory Visit: Payer: Self-pay | Admitting: Infectious Disease

## 2022-11-17 DIAGNOSIS — M86662 Other chronic osteomyelitis, left tibia and fibula: Secondary | ICD-10-CM

## 2022-11-17 MED ORDER — DOXYCYCLINE HYCLATE 100 MG PO TABS: 100.0000 mg | ORAL_TABLET | Freq: Two times a day (BID) | ORAL | 5 refills | Status: DC

## 2022-11-17 NOTE — Telephone Encounter (Signed)
duplicate

## 2022-11-18 ENCOUNTER — Emergency Department (HOSPITAL_COMMUNITY)
Admission: EM | Admit: 2022-11-18 | Discharge: 2022-11-18 | Disposition: A | Discharge status: Home / Self Care | Payer: Medicaid Other | Attending: Emergency Medicine | Admitting: Emergency Medicine

## 2022-11-18 ENCOUNTER — Emergency Department (HOSPITAL_COMMUNITY): Payer: Medicaid Other

## 2022-11-18 ENCOUNTER — Other Ambulatory Visit: Payer: Self-pay

## 2022-11-18 ENCOUNTER — Encounter (HOSPITAL_COMMUNITY): Payer: Self-pay | Admitting: Emergency Medicine

## 2022-11-18 DIAGNOSIS — N3000 Acute cystitis without hematuria: Secondary | ICD-10-CM

## 2022-11-18 DIAGNOSIS — G8929 Other chronic pain: Secondary | ICD-10-CM | POA: Diagnosis not present

## 2022-11-18 DIAGNOSIS — M25562 Pain in left knee: Secondary | ICD-10-CM | POA: Diagnosis present

## 2022-11-18 LAB — URINALYSIS, W/ REFLEX TO CULTURE (INFECTION SUSPECTED)
Bacteria, UA: NONE SEEN
Bilirubin Urine: NEGATIVE
Glucose, UA: NEGATIVE mg/dL
Hgb urine dipstick: NEGATIVE
Ketones, ur: NEGATIVE mg/dL
Nitrite: NEGATIVE
Protein, ur: NEGATIVE mg/dL
Specific Gravity, Urine: 1.021 (ref 1.005–1.030)
pH: 5 (ref 5.0–8.0)

## 2022-11-18 LAB — BASIC METABOLIC PANEL
Anion gap: 10 (ref 5–15)
BUN: 13 mg/dL (ref 6–20)
CO2: 21 mmol/L — ABNORMAL LOW (ref 22–32)
Calcium: 9.3 mg/dL (ref 8.9–10.3)
Chloride: 106 mmol/L (ref 98–111)
Creatinine, Ser: 0.73 mg/dL (ref 0.44–1.00)
GFR, Estimated: >60 mL/min (ref >60)
Glucose, Bld: 97 mg/dL (ref 70–99)
Potassium: 3.4 mmol/L — ABNORMAL LOW (ref 3.5–5.1)
Sodium: 137 mmol/L (ref 135–145)

## 2022-11-18 LAB — CBC
HCT: 39.8 % (ref 36.0–46.0)
Hemoglobin: 12.5 g/dL (ref 12.0–15.0)
MCH: 25.2 pg — ABNORMAL LOW (ref 26.0–34.0)
MCHC: 31.4 g/dL (ref 30.0–36.0)
MCV: 80.1 fL (ref 80.0–100.0)
Platelets: 539 10*3/uL — ABNORMAL HIGH (ref 150–400)
RBC: 4.97 MIL/uL (ref 3.87–5.11)
RDW: 16.8 % — ABNORMAL HIGH (ref 11.5–15.5)
WBC: 9.9 10*3/uL (ref 4.0–10.5)
nRBC: 0 % (ref 0.0–0.2)

## 2022-11-18 MED ORDER — FOSFOMYCIN TROMETHAMINE 3 G PO PACK
3.0000 g | PACK | Freq: Once | ORAL | Status: AC
  Administered 2022-11-18: 3 g via ORAL
  Filled 2022-11-18: qty 3

## 2022-11-18 MED ORDER — KETOROLAC TROMETHAMINE 15 MG/ML IJ SOLN
15.0000 mg | Freq: Once | INTRAMUSCULAR | Status: AC
  Administered 2022-11-18: 15 mg via INTRAVENOUS
  Filled 2022-11-18 (×2): qty 1

## 2022-11-18 NOTE — ED Provider Notes (Addendum)
[place] EMERGENCY DEPARTMENT AT [facility] Provider Note   CSN: [id number] Arrival date & time: 11/18/22  0409     History  Chief Complaint  Patient presents with   Knee Pain    Kerry Young is a 57 y.o. female who presents to ED with chronic left knee pain and suprapubic pain/dysuria. Patient endorses having a prescription for antibiotic for chronic osteomyelitis. Denies fever, chills, recent trauma.   Knee Pain      Home Medications Prior to Admission medications   Medication Sig Start Date End Date Taking? Authorizing Provider  acetaminophen (TYLENOL) 500 MG tablet Take 2 tablets (1,000 mg total) by mouth every 6 (six) hours as needed. Patient not taking: Reported on 07/16/2022 02/21/22 02/21/23  Chadwell, Ivin Booty, PA-C  doxycycline (VIBRA-TABS) 100 MG tablet Take 1 tablet (100 mg total) by mouth 2 (two) times daily. 11/17/22   Randall Hiss, MD  oxyCODONE (OXY IR/ROXICODONE) 5 MG immediate release tablet Take 1 tablet (5 mg total) by mouth every 6 (six) hours as needed for severe pain. 07/19/22   Glade Lloyd, MD  pregabalin (LYRICA) 300 MG capsule Take 1 capsule (300 mg total) by mouth 2 (two) times daily. 07/24/22   Clapacs, Jackquline Denmark, MD      Allergies    Amitriptyline, Celexa [citalopram], Methocarbamol, Naproxen, Other, and Zofran [ondansetron]    Review of Systems   Review of Systems  Genitourinary:  Positive for dysuria.  Musculoskeletal:  Positive for arthralgias.    Physical Exam Updated Vital Signs BP 135/82   Pulse 61   Temp 97.8 F (36.6 C) (Oral)   Resp 18   Ht 5\' 2"  (1.575 m)   Wt 86.2 kg   SpO2 93%   BMI 34.76 kg/m  Physical Exam Vitals and nursing note reviewed.  Constitutional:      General: She is not in acute distress. HENT:     Head: Normocephalic and atraumatic.     Mouth/Throat:     Mouth: Mucous membranes are moist.  Eyes:     General: No scleral icterus.       Right eye: No discharge.        Left eye: No  discharge.     Conjunctiva/sclera: Conjunctivae normal.  Cardiovascular:     Rate and Rhythm: Normal rate.     Pulses: Normal pulses.     Heart sounds: Normal heart sounds. No murmur heard. Pulmonary:     Effort: Pulmonary effort is normal. No respiratory distress.     Breath sounds: Normal breath sounds. No wheezing, rhonchi or rales.  Abdominal:     General: Bowel sounds are normal.     Palpations: Abdomen is soft. There is no mass.     Tenderness: There is abdominal tenderness.     Comments: Tenderness of suprapubic, RLQ, and LLQ areas  Musculoskeletal:     Right lower leg: No edema.     Left lower leg: No edema.     Comments: +2 pedal pulse. Bilateral feet are neurovascularly intact. Ankle and toes ROM without restriction. Left knee is swollen and warm. No erythema or purulence.  Skin:    General: Skin is warm and dry.     Findings: No rash.  Neurological:     General: No focal deficit present.     Mental Status: She is alert. Mental status is at baseline.  Psychiatric:        Mood and Affect: Mood normal.     ED  Results / Procedures / Treatments   Labs (all labs ordered are listed, but only abnormal results are displayed) Labs Reviewed  URINALYSIS, W/ REFLEX TO CULTURE (INFECTION SUSPECTED) - Abnormal; Notable for the following components:      Result Value   APPearance HAZY (*)    Leukocytes,Ua MODERATE (*)    All other components within normal limits  CBC - Abnormal; Notable for the following components:   MCH 25.2 (*)    RDW 16.8 (*)    Platelets 539 (*)    All other components within normal limits  BASIC METABOLIC PANEL    EKG None  Radiology DG Knee Complete 4 Views Left  Result Date: 11/18/2022 CLINICAL DATA:  57 year old female history of knee pain. EXAM: LEFT KNEE - COMPLETE 4+ VIEW COMPARISON:  Multiple priors, most recently 11/09/2022. FINDINGS: Chronic medial tibial plateau fracture again noted with further obscuration of the fracture lines  indicative of continued bony healing. Lateral plate and screw fixation device in the proximal tibia again noted, without evidence of hardware fracture or other hardware related complications. However, there continues to be lateral subluxation of the tibia and fibula relative to the distal femur (approximately 2.4 cm), in addition to approximately 15 degrees of varus angulation. As well, on the lateral projection there appears to be a greater degree of posterior subluxation of the tibia relative to the femur than seen on the prior study. No definite new acute displaced fracture noted. IMPRESSION: 1. Postoperative changes of ORIF in the left proximal tibia with poorly healing medial tibial plateau fracture, along with worsening severe subluxation and angulation at the knee joint, as above. Electronically Signed   By: Trudie Reed M.D.   On: 11/18/2022 06:11    Procedures Procedures    Medications Ordered in ED Medications  ketorolac (TORADOL) 15 MG/ML injection 15 mg (15 mg Intravenous Given 11/18/22 0515)    ED Course/ Medical Decision Making/ A&P                             Medical Decision Making Amount and/or Complexity of Data Reviewed Labs: ordered. Radiology: ordered.  Risk Prescription drug management.   This patient presents to the ED for concern of left knee pain, this involves an extensive number of treatment options, and is a complaint that carries with it a high risk of complications and morbidity.  The differential diagnosis includes hemarthrosis, gout, septic joint, fracture, tendonitis, carpal tunnel syndrome, muscle strain, bursitis, compartment syndrome   Co morbidities that complicate the patient evaluation  Chronic osteomyelitis   Additional history obtained:  None    Lab Tests:  I Ordered, and personally interpreted labs.  The pertinent results include:   -CBC: No concern for anemia or leukocytosis -UA: moderate leukocytes   Imaging Studies  ordered:  I ordered imaging studies including Knee Xray -Medial dislocation of the distal left femur, as described above. Correlation with physical examination is recommended. Prior ORIF of the proximal left tibia. Moderate sized joint effusion. I independently visualized and interpreted imaging Shared findings with patient I agree with the radiologist interpretation   Consultations Obtained:  I requested consultation with the Dr. Blanchie Dessert, and discussed lab and imaging findings as well as pertinent plan - they recommend: outpatient follow up during patient's appointment on 5/15 with Neena Rhymes   Problem List / ED Course / Critical interventions / Medication management  Patient presents for chronic left knee pain and suprapubic pain/dysuria. As  for patients chronic left knee pain - patient with pain management doctor. Patient with Internal Medicine doctor and Ortho appointment on 12/02/2022 for joint fusion. With patient's warm knee and history of chronic osteomyelitis, I believe Toradol should help with patients inflammatory picture and should provide some relief. Patient initially refused pain management. I eventually convinced patient to try the Toradol. Talked with Ortho consultation who recommended patient follow up outpatient during her next ortho apt on 5/15. As for patient's UTI symptoms and moderate leukocytes - patient endorsed starting doxycycline today. I have sent urine culture and will have patient follow up to ensure that ABX are covered by UTI susceptibility Upon discharge, patient states that she has been on doxycycline for around a week. States that her IM doctor talked about putting her on another ABX for the UTI, but did not prescribe her a medication due to it being a phone conversation. Staffed patient with Dr. Read Drivers who recommended giving patient dose of fosfomycin 3g. I have reviewed the patients home medicines and have made adjustments as needed Patient with stable  vitals and ready for discharge. Patient given return precautions.   Ddx these are considered less likely due to history of present illness and physical exam -compartment syndrome: area not tense; neurovascularly intact; ankle ROM intact   Social Determinants of Health:  none          Final Clinical Impression(s) / ED Diagnoses Final diagnoses:  Chronic pain of left knee  Acute cystitis without hematuria    Rx / DC Orders ED Discharge Orders     None         Dorthy Cooler, [place] 11/18/22 1610    Dorthy Cooler, PA-C 11/18/22 0656    Paula Libra, MD 11/18/22 0700

## 2022-11-18 NOTE — Discharge Instructions (Addendum)
It was a pleasure taking care of you today. Urinalysis showed UTI. You are already on an antibiotic. I have sent a urine culture. Please follow up with primary care provider to ensure antibiotic coverage is sufficient.  Seek emergency care if experiencing new or worsening symptoms.

## 2022-11-18 NOTE — ED Triage Notes (Signed)
BIBA from home for Lt knee pain and possible UTI, pain 10/10 had surgery on knee in past

## 2022-11-18 NOTE — ED Provider Notes (Signed)
I discussed the patient's chronic knee issues with Dr. Blanchie Dessert, who is familiar with the patient.  Patient originally had surgery with Dr. [name].  Dr. Blanchie Dessert tells me that the x-ray findings that I reported to him today are not new and are known.  Patient has been referred to ortho at [facility] for fusion.  Dr. Blanchie Dessert says likely nothing else to do at this point.  She is on Doxy from ID.  She will need to follow-up outpatient.   Roxy Horseman, PA-C 11/18/22 1610    Paula Libra, MD 11/18/22 0700

## 2022-11-18 NOTE — ED Notes (Addendum)
Informed pt need for urine specimen, offered bedpan, assistance to restroom. Pt states her pain is unbearable at this time and prefers to have pain management before ambulation.

## 2022-12-09 ENCOUNTER — Ambulatory Visit: Payer: Medicaid Other | Admitting: Infectious Disease

## 2023-07-28 ENCOUNTER — Other Ambulatory Visit: Payer: Self-pay

## 2023-07-28 ENCOUNTER — Encounter (HOSPITAL_COMMUNITY): Payer: Self-pay | Admitting: Emergency Medicine

## 2023-07-28 ENCOUNTER — Emergency Department (HOSPITAL_COMMUNITY): Payer: Medicaid Other

## 2023-07-28 ENCOUNTER — Emergency Department (HOSPITAL_COMMUNITY)
Admission: EM | Admit: 2023-07-28 | Discharge: 2023-07-28 | Disposition: A | Discharge status: Home / Self Care | Payer: Medicaid Other | Attending: Emergency Medicine | Admitting: Emergency Medicine

## 2023-07-28 DIAGNOSIS — L03116 Cellulitis of left lower limb: Secondary | ICD-10-CM

## 2023-07-28 DIAGNOSIS — L0293 Carbuncle, unspecified: Secondary | ICD-10-CM | POA: Insufficient documentation

## 2023-07-28 DIAGNOSIS — M79605 Pain in left leg: Secondary | ICD-10-CM | POA: Diagnosis present

## 2023-07-28 DIAGNOSIS — D72829 Elevated white blood cell count, unspecified: Secondary | ICD-10-CM | POA: Insufficient documentation

## 2023-07-28 LAB — CBC WITH DIFFERENTIAL/PLATELET
Abs Immature Granulocytes: 0.08 10*3/uL — ABNORMAL HIGH (ref 0.00–0.07)
Basophils Absolute: 0.1 10*3/uL (ref 0.0–0.1)
Basophils Relative: 1 %
Eosinophils Absolute: 0.6 10*3/uL — ABNORMAL HIGH (ref 0.0–0.5)
Eosinophils Relative: 5 %
HCT: 44.2 % (ref 36.0–46.0)
Hemoglobin: 14.1 g/dL (ref 12.0–15.0)
Immature Granulocytes: 1 %
Lymphocytes Relative: 21 %
Lymphs Abs: 3 10*3/uL (ref 0.7–4.0)
MCH: 26.5 pg (ref 26.0–34.0)
MCHC: 31.9 g/dL (ref 30.0–36.0)
MCV: 82.9 fL (ref 80.0–100.0)
Monocytes Absolute: 1 10*3/uL (ref 0.1–1.0)
Monocytes Relative: 7 %
Neutro Abs: 9.3 10*3/uL — ABNORMAL HIGH (ref 1.7–7.7)
Neutrophils Relative %: 65 %
Platelets: 482 10*3/uL — ABNORMAL HIGH (ref 150–400)
RBC: 5.33 MIL/uL — ABNORMAL HIGH (ref 3.87–5.11)
RDW: 14.8 % (ref 11.5–15.5)
WBC: 14.1 10*3/uL — ABNORMAL HIGH (ref 4.0–10.5)
nRBC: 0 % (ref 0.0–0.2)

## 2023-07-28 LAB — BASIC METABOLIC PANEL
Anion gap: 11 (ref 5–15)
BUN: 11 mg/dL (ref 6–20)
CO2: 19 mmol/L — ABNORMAL LOW (ref 22–32)
Calcium: 9.6 mg/dL (ref 8.9–10.3)
Chloride: 109 mmol/L (ref 98–111)
Creatinine, Ser: 0.71 mg/dL (ref 0.44–1.00)
GFR, Estimated: >60 mL/min (ref >60)
Glucose, Bld: 89 mg/dL (ref 70–99)
Potassium: 3.9 mmol/L (ref 3.5–5.1)
Sodium: 139 mmol/L (ref 135–145)

## 2023-07-28 MED ORDER — HYDROCODONE-ACETAMINOPHEN 5-325 MG PO TABS
1.0000 | ORAL_TABLET | Freq: Once | ORAL | Status: AC
Start: 2023-07-28 — End: 2023-07-28
  Administered 2023-07-28: 1 via ORAL
  Filled 2023-07-28: qty 1

## 2023-07-28 MED ORDER — DOXYCYCLINE HYCLATE 100 MG PO TABS
100.0000 mg | ORAL_TABLET | Freq: Once | ORAL | Status: AC
  Administered 2023-07-28: 100 mg via ORAL
  Filled 2023-07-28: qty 1

## 2023-07-28 MED ORDER — DOXYCYCLINE HYCLATE 100 MG PO CAPS: 100.0000 mg | ORAL_CAPSULE | Freq: Two times a day (BID) | ORAL | 0 refills | Status: AC

## 2023-07-28 MED ORDER — MORPHINE SULFATE (PF) 4 MG/ML IV SOLN
4.0000 mg | Freq: Once | INTRAVENOUS | Status: AC
  Administered 2023-07-28: 4 mg via INTRAMUSCULAR
  Filled 2023-07-28: qty 1

## 2023-07-28 MED ORDER — ONDANSETRON 4 MG PO TBDP
4.0000 mg | ORAL_TABLET | Freq: Once | ORAL | Status: DC
  Filled 2023-07-28: qty 1

## 2023-07-28 NOTE — ED Triage Notes (Signed)
 Pt here for L leg swelling states she had MRSA from surgery on the same leg 1 year ago. Reports increase in leg swelling x 1 week and now weeping from L leg. States her home medication has not helped the pain.

## 2023-07-28 NOTE — ED Notes (Signed)
 Pt requesting pain meds again, triage nurse notified. Stated that her night meds are almost due as well.

## 2023-07-28 NOTE — ED Provider Notes (Addendum)
  EMERGENCY DEPARTMENT AT [facility] Provider Note   CSN: [id number] Arrival date & time: 07/28/23  1448     History  Chief Complaint  Patient presents with   Leg Swelling    Kerry Young is a 58 y.o. female presents today for leg swelling near her left knee and no drainage from the area.  Patient states that approximately 1 year ago she had an MRSA infection on the same leg.  Patient endorses pain, swelling, redness, and discharge.  Patient denies fever, chills, nausea, vomiting, or any other complaints at this time.  HPI     Home Medications Prior to Admission medications   Medication Sig Start Date End Date Taking? Authorizing Provider  doxycycline  (VIBRAMYCIN ) 100 MG capsule Take 1 capsule (100 mg total) by mouth 2 (two) times daily for 7 days. 07/28/23 08/04/23 Yes Francis Ileana SAILOR, PA-C  oxyCODONE  (OXY IR/ROXICODONE ) 5 MG immediate release tablet Take 1 tablet (5 mg total) by mouth every 6 (six) hours as needed for severe pain. 07/19/22   Cheryle Page, MD  pregabalin  (LYRICA ) 300 MG capsule Take 1 capsule (300 mg total) by mouth 2 (two) times daily. 07/24/22   Clapacs, Norleen DASEN, MD      Allergies    Amitriptyline, Celexa  [citalopram ], Methocarbamol , Naproxen , Other, and Zofran  [ondansetron ]    Review of Systems   Review of Systems  Skin:  Positive for wound.    Physical Exam Updated Vital Signs BP 101/74 (BP Location: Left Arm)   Pulse 95   Temp 98 F (36.7 C) (Oral)   Resp 18   Ht 5' 2 (1.575 m)   Wt 95.3 kg   SpO2 99%   BMI 38.41 kg/m  Physical Exam Vitals and nursing note reviewed.  Constitutional:      General: She is not in acute distress.    Appearance: She is well-developed.  HENT:     Head: Normocephalic and atraumatic.     Right Ear: External ear normal.     Left Ear: External ear normal.     Nose: Nose normal.  Eyes:     Extraocular Movements: Extraocular movements intact.     Conjunctiva/sclera: Conjunctivae normal.   Cardiovascular:     Rate and Rhythm: Normal rate and regular rhythm.     Pulses: Normal pulses.     Heart sounds: Normal heart sounds. No murmur heard. Pulmonary:     Effort: Pulmonary effort is normal. No respiratory distress.     Breath sounds: Normal breath sounds.  Abdominal:     Palpations: Abdomen is soft.     Tenderness: There is no abdominal tenderness.  Musculoskeletal:        General: No swelling. Normal range of motion.     Cervical back: Neck supple.     Comments: Mild edema around wound area  Skin:    General: Skin is warm and dry.     Capillary Refill: Capillary refill takes less than 2 seconds.          Comments: Patient has approximately 2 cm area of erythema and tenderness.  Area appears to be a furuncle which is oozing serous discharge.  Patient is neurovascularly intact has full range of motion of her left knee.  Neurological:     General: No focal deficit present.     Mental Status: She is alert.     Motor: No weakness.  Psychiatric:        Mood and Affect: Mood normal.  ED Results / Procedures / Treatments   Labs (all labs ordered are listed, but only abnormal results are displayed) Labs Reviewed  CBC WITH DIFFERENTIAL/PLATELET - Abnormal; Notable for the following components:      Result Value   WBC 14.1 (*)    RBC 5.33 (*)    Platelets 482 (*)    Neutro Abs 9.3 (*)    Eosinophils Absolute 0.6 (*)    Abs Immature Granulocytes 0.08 (*)    All other components within normal limits  BASIC METABOLIC PANEL - Abnormal; Notable for the following components:   CO2 19 (*)    All other components within normal limits    EKG None  Radiology DG Tibia/Fibula Left Result Date: 07/28/2023 CLINICAL DATA:  Left leg pain and wound. EXAM: LEFT TIBIA AND FIBULA - 2 VIEW COMPARISON:  Radiographs 11/18/2022 FINDINGS: Stable appearing tibial hardware from prior tibial plateau fractures. No acute bony abnormalities are identified. The hardware is intact. Moderate  osteopenia noted. IMPRESSION: 1. Stable appearing tibial hardware. 2. No acute bony findings. 3. Moderate osteopenia. Electronically Signed   By: MYRTIS Stammer M.D.   On: 07/28/2023 17:59    Procedures Procedures    Medications Ordered in ED Medications  ondansetron  (ZOFRAN -ODT) disintegrating tablet 4 mg (4 mg Oral Patient Refused/Not Given 07/28/23 1514)  HYDROcodone -acetaminophen  (NORCO/VICODIN) 5-325 MG per tablet 1 tablet (1 tablet Oral Given 07/28/23 1514)  HYDROcodone -acetaminophen  (NORCO/VICODIN) 5-325 MG per tablet 1 tablet (1 tablet Oral Given 07/28/23 2023)  morphine  (PF) 4 MG/ML injection 4 mg (4 mg Intramuscular Given 07/28/23 2201)  doxycycline  (VIBRA -TABS) tablet 100 mg (100 mg Oral Given 07/28/23 2208)    ED Course/ Medical Decision Making/ A&P                                 Medical Decision Making Risk Prescription drug management.   This patient presents to the ED with chief complaint(s) of left leg pain and swelling with pertinent past medical history of MRSA and left leg which further complicates the presenting complaint. The complaint involves an extensive differential diagnosis and also carries with it a high risk of complications and morbidity.    The differential diagnosis includes cellulitis, osteomyelitis, fracture, musculoskeletal pain  Additional history obtained: Records reviewed Care Everywhere/External Records  ED Course and Reassessment:   Independent labs interpretation:  The following labs were independently interpreted:  CBC: Leukocytosis of 14.1 BMP: Mildly decreased CO2  Independent visualization of imaging: - I independently visualized the following imaging with scope of interpretation limited to determining acute life threatening conditions related to emergency care: Tib-fib x-ray, which revealed stable appearing tibial hardware, no acute bony findings, moderate osteopenia  Consultation: - Consulted or discussed management/test interpretation  w/ external professional: None  Consideration for admission or further workup: Considered for mission further However patient's vital signs, labs, imaging, and physical exam of all been reassuring.  Patient's symptoms appear to be due to a furuncle/cellulitis.  Patient has no red flag signs or symptoms concerning for osteomyelitis or septic joint.  Patient will be put on outpatient doxycycline  and have follow-up with her infectious disease doctor as soon as possible.        Final Clinical Impression(s) / ED Diagnoses Final diagnoses:  Cellulitis of left lower extremity  Carbuncle    Rx / DC Orders ED Discharge Orders          Ordered    doxycycline  (VIBRAMYCIN )  100 MG capsule  2 times daily        07/28/23 2159              Francis Ileana SAILOR, PA-C 07/28/23 2214    Francis Ileana SAILOR, PA-C 07/28/23 2215    Yolande Lamar BROCKS, MD 08/03/23 [phone]

## 2023-07-28 NOTE — Discharge Instructions (Signed)
 Today you are seen for cellulitis of your left knee.  Please pick up your antibiotic and take as prescribed.  Please follow-up with your infectious disease doctor soon as you can.  Please return to the ER if you have fevers, chills, or your wound worsens. Thank you for letting us  treat you today. After reviewing your labs and imaging, I feel you are safe to go home. Please follow up with your PCP in the next several days and provide them with your records from this visit. Return to the Emergency Room if pain becomes severe or symptoms worsen.

## 2023-07-28 NOTE — ED Provider Triage Note (Signed)
 Emergency Medicine Provider Triage Evaluation Note  Kerry Young , a 58 y.o. female  was evaluated in triage.  Pt complains of wound just distal to left knee.  Progressively worsening since last Thursday.  Complains of drainage.  No fever.  Review of Systems  Positive: As above Negative: As above  Physical Exam  BP 109/88 (BP Location: Right Arm)   Pulse (!) 103   Temp 97.9 F (36.6 C) (Oral)   Resp 20   Ht 5' 2 (1.575 m)   Wt 95.3 kg   SpO2 95%   BMI 38.41 kg/m  Gen:   Awake, no distress   Resp:  Normal effort  MSK:   Moves extremities without difficulty Other:    Medical Decision Making  Medically screening exam initiated at 3:20 PM.  Appropriate orders placed.  Kerry Young was informed that the remainder of the evaluation will be completed by another provider, this initial triage assessment does not replace that evaluation, and the importance of remaining in the ED until their evaluation is complete.     Hildegard Loge, PA-C 07/28/23 2157

## 2023-08-03 ENCOUNTER — Other Ambulatory Visit: Payer: Self-pay

## 2023-08-03 ENCOUNTER — Encounter: Payer: Self-pay | Admitting: Internal Medicine

## 2023-08-03 ENCOUNTER — Ambulatory Visit (INDEPENDENT_AMBULATORY_CARE_PROVIDER_SITE_OTHER): Payer: Medicaid Other | Admitting: Internal Medicine

## 2023-08-03 VITALS — BP 112/80 | HR 85 | Resp 16 | Ht 61.0 in | Wt 210.0 lb

## 2023-08-03 DIAGNOSIS — M86662 Other chronic osteomyelitis, left tibia and fibula: Secondary | ICD-10-CM | POA: Diagnosis present

## 2023-08-03 MED ORDER — LINEZOLID 600 MG PO TABS: 600.0000 mg | ORAL_TABLET | Freq: Two times a day (BID) | ORAL | 0 refills | Status: AC

## 2023-08-03 NOTE — Progress Notes (Addendum)
 Patient: Kerry Young  DOB: 1965/09/15 MRN: [id number] PCP: Emilio Joesph DEL, PA-C    Chief Complaint  Patient presents with   Follow-up     Patient Active Problem List   Diagnosis Date Noted   Adjustment disorder with mixed disturbance of emotions and conduct 07/24/2022   Major depressive disorder, single episode, severe without psychosis (HCC) 07/23/2022   Medication overdose, undetermined intent, initial encounter 07/16/2022   Hypokalemia 07/16/2022   Breast nodule 07/16/2022   AMS (altered mental status) 07/16/2022   Chronic osteomyelitis (HCC)    MRSA infection    Hardware complicating wound infection (HCC)    Staphylococcal arthritis of knee (HCC)    Normocytic anemia 02/16/2022   Obesity (BMI 30-39.9) 02/14/2022   Chronic pain disorder 02/14/2022   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 02/13/2022   Closed fracture of left tibial plateau 01/27/2022   Opioid type dependence, continuous (HCC) 11/10/2021   Complex regional pain syndrome type 1 of right lower extremity 09/15/2021   Lumbar facet joint pain 09/15/2021   PTSD (post-traumatic stress disorder) 03/17/2021   Proximal humerus fracture 02/04/2021   GAD (generalized anxiety disorder) 07/08/2020   Mild depression 07/08/2020   Insomnia 07/08/2020   Nicotine  dependence 06/10/2020   Closed fracture of fifth metatarsal bone 05/27/2020   Class 2 obesity due to excess calories without serious comorbidity with body mass index (BMI) of 38.0 to 38.9 in adult 04/05/2019   Anxiety 01/16/2019   Panic attacks 01/16/2019   Spinal stenosis of lumbar region with neurogenic claudication 01/10/2019   Lumbar facet arthropathy 11/04/2018   Chronic midline low back pain without sciatica 11/04/2018   Lumbar foraminal stenosis 10/19/2018   Chronic pain syndrome 10/19/2018   Lumbar radiculopathy 09/23/2018     Subjective:  Kerry Young is a 58 y.o. F with past medical history as below presents for follow-up of  osteomyelitis involving left leg due to MRSA and hardware involvement on doxycycline  chronically.  She was last seen by Dr. Lindia on 06/08/2022.  Patient had a left back condylar tibial plateau fracture and underwent ORIF on January 28, 2022.  The week afterwards she fell from her wheelchair to the ground and had persistent drainage from her wound.  Taken to the OR with Dr. Kendal for I&D of the left septic knee as well left postop infection around hardware near fracture and patellar tendon extending proximal tibia.  Cultures grew MRSA.  She would have been treated with daptomycin  and then transition to Doxy for chronic suppression. On 07/28/2023 patient went to the ED for redness at left leg.  Noted to have furuncle, x-ray negative.  WBC 14.1 K.  Doxycycline  continued. Today 08/03/2023: Pt states she had 5 blisters then a boil 10 days go. Will get CT right leg. Sees pain management, notes hydrocone.  Sees Dr. Kendal on 1/29. Has been on of doxy for several months. She states ortho to stop taking it as possible surgery planned. Review of Systems  All other systems reviewed and are negative.   Past Medical History:  Diagnosis Date   Anxiety    Arthritis    Migraines    PONV (postoperative nausea and vomiting)    PTSD (post-traumatic stress disorder)    Sciatica    UTI (urinary tract infection)     Outpatient Medications Prior to Visit  Medication Sig Dispense Refill   doxycycline  (VIBRAMYCIN ) 100 MG capsule Take 1 capsule (100 mg total) by mouth 2 (  two) times daily for 7 days. 14 capsule 0   HYDROcodone -acetaminophen  (NORCO) 10-325 MG tablet Take by mouth.     pregabalin  (LYRICA ) 300 MG capsule Take 1 capsule (300 mg total) by mouth 2 (two) times daily. 60 capsule 0   oxyCODONE  (OXY IR/ROXICODONE ) 5 MG immediate release tablet Take 1 tablet (5 mg total) by mouth every 6 (six) hours as needed for severe pain. (Patient not taking: Reported on 08/03/2023)     No facility-administered medications  prior to visit.     Allergies  Allergen Reactions   Amitriptyline Other (See Comments)    Cold sores on mouth, blister on tongue. No facial swelling    Celexa  [Citalopram ] Nausea And Vomiting and Other (See Comments)    Took it 2 days. Had n/v and migraine.   Methocarbamol  Nausea Only   Naproxen  Nausea Only   Other Nausea And Vomiting and Other (See Comments)    ALL ANTIBIOTICS   Zofran  [Ondansetron ] Other (See Comments)    Headaches     Social History   Tobacco Use   Smoking status: Every Day    Current packs/day: 0.00    Average packs/day: 0.5 packs/day for 25.0 years (12.5 ttl pk-yrs)    Types: Cigarettes    Start date: 07/11/1993    Last attempt to quit: 07/11/2018    Years since quitting: 5.0   Smokeless tobacco: Never  Vaping Use   Vaping status: Never Used  Substance Use Topics   Alcohol use: Not Currently    Comment: 1 per year maybe.   Drug use: No    Family History  Problem Relation Age of Onset   Anxiety disorder Mother    Hypertension Mother    Kidney failure Mother    Heart attack Paternal Grandfather    Healthy Daughter     Objective:   Vitals:   08/03/23 1122  Resp: 16  Weight: 210 lb (95.3 kg)  Height: 5' 1 (1.549 m)   Body mass index is 39.68 kg/m.  Physical Exam Constitutional:      Appearance: Normal appearance.  HENT:     Head: Normocephalic and atraumatic.     Right Ear: Tympanic membrane normal.     Left Ear: Tympanic membrane normal.     Nose: Nose normal.     Mouth/Throat:     Mouth: Mucous membranes are moist.  Eyes:     Extraocular Movements: Extraocular movements intact.     Conjunctiva/sclera: Conjunctivae normal.     Pupils: Pupils are equal, round, and reactive to light.  Cardiovascular:     Rate and Rhythm: Normal rate and regular rhythm.     Heart sounds: No murmur heard.    No friction rub. No gallop.  Pulmonary:     Effort: Pulmonary effort is normal.     Breath sounds: Normal breath sounds.  Abdominal:      General: Abdomen is flat.     Palpations: Abdomen is soft.  Musculoskeletal:        General: Normal range of motion.  Skin:    General: Skin is warm and dry.  Neurological:     General: No focal deficit present.     Mental Status: She is alert and oriented to person, place, and time.  Psychiatric:        Mood and Affect: Mood normal.     Lab Results: Lab Results  Component Value Date   WBC 14.1 (H) 07/28/2023   HGB 14.1 07/28/2023   HCT 44.2  07/28/2023   MCV 82.9 07/28/2023   PLT 482 (H) 07/28/2023    Lab Results  Component Value Date   CREATININE 0.71 07/28/2023   BUN 11 07/28/2023   NA 139 07/28/2023   K 3.9 07/28/2023   CL 109 07/28/2023   CO2 19 (L) 07/28/2023    Lab Results  Component Value Date   ALT 14 07/17/2022   AST 18 07/17/2022   ALKPHOS 51 07/17/2022   BILITOT 0.8 07/17/2022     Assessment & Plan:   Problem List Items Addressed This Visit   None Visit Diagnoses       Chronic osteomyelitis of left lower leg (HCC)    -  Primary   Relevant Orders   CBC with Differential/Platelet   COMPLETE METABOLIC PANEL WITH GFR   Sed Rate (ESR)   CRP (C-Reactive Protein)      #OM of left leg with HWR. (MRSA) -Pt had stopped suppressive doxy then developed blister at beginning of the year which drains at times. Restarted on doxy and given ID f/u. Denies fevers/chills -Counseled pt to call ortho to move up appt, sent message to ortho via epic chat as well. Does not appear she has appt with [facility]. [facility] on 1/29. -Hold doxy while on linezolid  -Labs today -Linezolid  x 2 weeks -Stat CT left leg -F/U with ID on 08/16/23 to review labs  #chornic pain -Follows with pain manaagement.   Loney Stank, MD [facility]   08/03/23  11:26 AM   I have personally spent 45 minutes involved in face-to-face and non-face-to-face activities for this patient on the day of the visit. Professional time spent includes  the following activities: Preparing to see the patient (review of tests), Obtaining and/or reviewing separately obtained history (admission/discharge record), Performing a medically appropriate examination and/or evaluation , Ordering medications/tests/procedures, referring and communicating with other health care professionals, Documenting clinical information in the EMR, Independently interpreting results (not separately reported), Communicating results to the patient/family/caregiver, Counseling and educating the patient/family/caregiver and Care coordination (not separately reported).

## 2023-08-04 ENCOUNTER — Ambulatory Visit (HOSPITAL_COMMUNITY): Payer: Medicaid Other

## 2023-08-04 LAB — CBC WITH DIFFERENTIAL/PLATELET
Absolute Lymphocytes: 2612 {cells}/uL (ref 850–3900)
Absolute Monocytes: 592 {cells}/uL (ref 200–950)
Basophils Absolute: 109 {cells}/uL (ref 0–200)
Basophils Relative: 1.2 %
Eosinophils Absolute: 446 {cells}/uL (ref 15–500)
Eosinophils Relative: 4.9 %
HCT: 41.7 % (ref 35.0–45.0)
Hemoglobin: 13.3 g/dL (ref 11.7–15.5)
MCH: 26 pg — ABNORMAL LOW (ref 27.0–33.0)
MCHC: 31.9 g/dL — ABNORMAL LOW (ref 32.0–36.0)
MCV: 81.6 fL (ref 80.0–100.0)
MPV: 10.6 fL (ref 7.5–12.5)
Monocytes Relative: 6.5 %
Neutro Abs: 5342 {cells}/uL (ref 1500–7800)
Neutrophils Relative %: 58.7 %
Platelets: 468 10*3/uL — ABNORMAL HIGH (ref 140–400)
RBC: 5.11 10*6/uL — ABNORMAL HIGH (ref 3.80–5.10)
RDW: 14.3 % (ref 11.0–15.0)
Total Lymphocyte: 28.7 %
WBC: 9.1 10*3/uL (ref 3.8–10.8)

## 2023-08-04 LAB — COMPLETE METABOLIC PANEL WITH GFR
AG Ratio: 1 (calc) (ref 1.0–2.5)
ALT: 11 U/L (ref 6–29)
AST: 13 U/L (ref 10–35)
Albumin: 3.8 g/dL (ref 3.6–5.1)
Alkaline phosphatase (APISO): 69 U/L (ref 37–153)
BUN: 13 mg/dL (ref 7–25)
CO2: 22 mmol/L (ref 20–32)
Calcium: 9.2 mg/dL (ref 8.6–10.4)
Chloride: 108 mmol/L (ref 98–110)
Creat: 0.78 mg/dL (ref 0.50–1.03)
Globulin: 3.7 g/dL (ref 1.9–3.7)
Glucose, Bld: 88 mg/dL (ref 65–99)
Potassium: 4.4 mmol/L (ref 3.5–5.3)
Sodium: 140 mmol/L (ref 135–146)
Total Bilirubin: 0.4 mg/dL (ref 0.2–1.2)
Total Protein: 7.5 g/dL (ref 6.1–8.1)
eGFR: 89 mL/min/{1.73_m2} (ref >=60)

## 2023-08-04 LAB — SEDIMENTATION RATE: Sed Rate: 28 mm/h (ref 0–30)

## 2023-08-04 LAB — C-REACTIVE PROTEIN: CRP: 7.1 mg/L (ref <8.0)

## 2023-08-12 ENCOUNTER — Ambulatory Visit: Payer: Medicaid Other | Admitting: Internal Medicine

## 2023-08-13 ENCOUNTER — Ambulatory Visit (HOSPITAL_COMMUNITY): Payer: Medicaid Other

## 2023-08-16 ENCOUNTER — Ambulatory Visit: Payer: Medicaid Other | Admitting: Internal Medicine

## 2023-08-25 ENCOUNTER — Ambulatory Visit: Payer: Medicaid Other | Admitting: Internal Medicine
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
## 2023-09-01 ENCOUNTER — Ambulatory Visit: Payer: Medicaid Other | Admitting: Infectious Disease

## 2023-09-01 DIAGNOSIS — M8660 Other chronic osteomyelitis, unspecified site: Secondary | ICD-10-CM

## 2023-09-01 DIAGNOSIS — M00069 Staphylococcal arthritis, unspecified knee: Secondary | ICD-10-CM

## 2023-09-01 DIAGNOSIS — T847XXD Infection and inflammatory reaction due to other internal orthopedic prosthetic devices, implants and grafts, subsequent encounter: Secondary | ICD-10-CM

## 2023-10-05 ENCOUNTER — Emergency Department (HOSPITAL_COMMUNITY)
Admission: EM | Admit: 2023-10-05 | Discharge: 2023-10-05 | Disposition: A | Discharge status: Home / Self Care | Attending: Emergency Medicine | Admitting: Emergency Medicine

## 2023-10-05 ENCOUNTER — Other Ambulatory Visit: Payer: Self-pay

## 2023-10-05 ENCOUNTER — Emergency Department (HOSPITAL_COMMUNITY)

## 2023-10-05 ENCOUNTER — Encounter (HOSPITAL_COMMUNITY): Payer: Self-pay

## 2023-10-05 DIAGNOSIS — L02416 Cutaneous abscess of left lower limb: Secondary | ICD-10-CM | POA: Insufficient documentation

## 2023-10-05 DIAGNOSIS — M25562 Pain in left knee: Secondary | ICD-10-CM | POA: Diagnosis present

## 2023-10-05 DIAGNOSIS — G8929 Other chronic pain: Secondary | ICD-10-CM | POA: Diagnosis not present

## 2023-10-05 DIAGNOSIS — L0291 Cutaneous abscess, unspecified: Secondary | ICD-10-CM

## 2023-10-05 LAB — URINALYSIS, W/ REFLEX TO CULTURE (INFECTION SUSPECTED)
Bilirubin Urine: NEGATIVE
Glucose, UA: NEGATIVE mg/dL
Hgb urine dipstick: NEGATIVE
Ketones, ur: 20 mg/dL — AB
Leukocytes,Ua: NEGATIVE
Nitrite: POSITIVE — AB
Protein, ur: 30 mg/dL — AB
Specific Gravity, Urine: 1.024 (ref 1.005–1.030)
pH: 6 (ref 5.0–8.0)

## 2023-10-05 LAB — BASIC METABOLIC PANEL
Anion gap: 10 (ref 5–15)
BUN: 10 mg/dL (ref 6–20)
CO2: 16 mmol/L — ABNORMAL LOW (ref 22–32)
Calcium: 9.2 mg/dL (ref 8.9–10.3)
Chloride: 114 mmol/L — ABNORMAL HIGH (ref 98–111)
Creatinine, Ser: 0.72 mg/dL (ref 0.44–1.00)
GFR, Estimated: >60 mL/min (ref >60)
Glucose, Bld: 92 mg/dL (ref 70–99)
Potassium: 3.7 mmol/L (ref 3.5–5.1)
Sodium: 140 mmol/L (ref 135–145)

## 2023-10-05 LAB — CBC WITH DIFFERENTIAL/PLATELET
Abs Immature Granulocytes: 0.04 10*3/uL (ref 0.00–0.07)
Basophils Absolute: 0.1 10*3/uL (ref 0.0–0.1)
Basophils Relative: 1 %
Eosinophils Absolute: 0.2 10*3/uL (ref 0.0–0.5)
Eosinophils Relative: 2 %
HCT: 45.3 % (ref 36.0–46.0)
Hemoglobin: 14.2 g/dL (ref 12.0–15.0)
Immature Granulocytes: 0 %
Lymphocytes Relative: 21 %
Lymphs Abs: 2.1 10*3/uL (ref 0.7–4.0)
MCH: 26.8 pg (ref 26.0–34.0)
MCHC: 31.3 g/dL (ref 30.0–36.0)
MCV: 85.6 fL (ref 80.0–100.0)
Monocytes Absolute: 0.6 10*3/uL (ref 0.1–1.0)
Monocytes Relative: 6 %
Neutro Abs: 6.9 10*3/uL (ref 1.7–7.7)
Neutrophils Relative %: 70 %
Platelets: 356 10*3/uL (ref 150–400)
RBC: 5.29 MIL/uL — ABNORMAL HIGH (ref 3.87–5.11)
RDW: 15.4 % (ref 11.5–15.5)
WBC: 10 10*3/uL (ref 4.0–10.5)
nRBC: 0 % (ref 0.0–0.2)

## 2023-10-05 LAB — I-STAT CG4 LACTIC ACID, ED: Lactic Acid, Venous: 1 mmol/L (ref 0.5–1.9)

## 2023-10-05 MED ORDER — METOCLOPRAMIDE HCL 5 MG/ML IJ SOLN
10.0000 mg | Freq: Once | INTRAMUSCULAR | Status: AC
  Administered 2023-10-05: 10 mg via INTRAVENOUS
  Filled 2023-10-05: qty 2

## 2023-10-05 MED ORDER — HYDROCODONE-ACETAMINOPHEN 10-325 MG PO TABS
1.0000 | ORAL_TABLET | Freq: Once | ORAL | Status: AC
  Administered 2023-10-05: 1 via ORAL
  Filled 2023-10-05: qty 1

## 2023-10-05 MED ORDER — LIDOCAINE HCL (PF) 1 % IJ SOLN
10.0000 mL | Freq: Once | INTRAMUSCULAR | Status: AC
  Administered 2023-10-05: 10 mL
  Filled 2023-10-05: qty 10

## 2023-10-05 MED ORDER — DIPHENHYDRAMINE HCL 50 MG/ML IJ SOLN
12.5000 mg | Freq: Once | INTRAMUSCULAR | Status: AC
  Administered 2023-10-05: 12.5 mg via INTRAVENOUS
  Filled 2023-10-05: qty 1

## 2023-10-05 MED ORDER — HYDROMORPHONE HCL 1 MG/ML IJ SOLN
1.0000 mg | Freq: Once | INTRAMUSCULAR | Status: AC
  Administered 2023-10-05: 1 mg via INTRAVENOUS
  Filled 2023-10-05: qty 1

## 2023-10-05 MED ORDER — SODIUM CHLORIDE 0.9 % IV SOLN
12.5000 mg | Freq: Once | INTRAVENOUS | Status: AC
  Administered 2023-10-05: 12.5 mg via INTRAVENOUS
  Filled 2023-10-05: qty 0.5

## 2023-10-05 MED ORDER — SODIUM CHLORIDE 0.9 % IV BOLUS
1000.0000 mL | Freq: Once | INTRAVENOUS | Status: AC
  Administered 2023-10-05: 1000 mL via INTRAVENOUS

## 2023-10-05 MED ORDER — DEXTROSE 5 % IV SOLN
1500.0000 mg | Freq: Once | INTRAVENOUS | Status: DC
  Filled 2023-10-05: qty 75

## 2023-10-05 MED ORDER — LORAZEPAM 2 MG/ML IJ SOLN
1.0000 mg | Freq: Once | INTRAMUSCULAR | Status: AC
  Administered 2023-10-05: 1 mg via INTRAVENOUS
  Filled 2023-10-05: qty 1

## 2023-10-05 MED ORDER — PREGABALIN 100 MG PO CAPS
300.0000 mg | ORAL_CAPSULE | Freq: Once | ORAL | Status: AC
  Administered 2023-10-05: 300 mg via ORAL
  Filled 2023-10-05: qty 3

## 2023-10-05 NOTE — ED Notes (Signed)
 Attempted IV access x1, patient tolerated poorly.

## 2023-10-05 NOTE — ED Provider Notes (Signed)
 [facility] DEPARTMENT AT [facility] Provider Note   CSN: [id number] Arrival date & time: 10/05/23  1232     History  Chief Complaint  Patient presents with  . Knee Pain    Kerry Young is a 58 y.o. female.  With history of anxiety, sciatica, PTSD, chronic osteomyelitis of the left lower leg presenting to the ED for evaluation of left knee pain.  She has chronic left knee pain and sees [facility] for pain management and is on a pain management contract.  She takes hydrocodone daily for her pain.  She states the pain worsened 3 days ago.  She was started on linezolid for cellulitis 4 days ago.  She also states that she developed nausea, vomiting and diarrhea shortly after starting the linezolid.  She is currently on doxycycline.  She follows with infectious disease at [facility] for her chronic osteomyelitis.  She denies any fevers.  She reports mild generalized abdominal pain.  She was initially scheduled for a surgery on her left knee 4 days from now but states this has been postponed due to the infection.  Pain is localized to the entirety of the left knee and radiates down to the left ankle   Knee Pain      Home Medications Prior to Admission medications   Medication Sig Start Date End Date Taking? Authorizing Provider  oxyCODONE (OXY IR/ROXICODONE) 5 MG immediate release tablet Take 1 tablet (5 mg total) by mouth every 6 (six) hours as needed for severe pain. Patient not taking: Reported on 08/03/2023 07/19/22   Glade Lloyd, MD  pregabalin (LYRICA) 300 MG capsule Take 1 capsule (300 mg total) by mouth 2 (two) times daily. 07/24/22   Clapacs, Jackquline Denmark, MD      Allergies    Amitriptyline, Celexa [citalopram], Methocarbamol, Naproxen, Other, and Zofran [ondansetron]    Review of Systems   Review of Systems  Gastrointestinal:  Positive for diarrhea, nausea and vomiting.  Musculoskeletal:  Positive for arthralgias.  All other systems reviewed and are  negative.   Physical Exam Updated Vital Signs BP 125/86 (BP Location: Left Arm)   Pulse (!) 58   Temp (!) 97.5 F (36.4 C) (Oral)   Resp 18   SpO2 98%  Physical Exam Vitals and nursing note reviewed.  Constitutional:      General: She is not in acute distress.    Appearance: Normal appearance. She is normal weight. She is not ill-appearing.     Comments: Resting in bed, appears uncomfortable  HENT:     Head: Normocephalic and atraumatic.  Pulmonary:     Effort: Pulmonary effort is normal. No respiratory distress.  Abdominal:     General: Abdomen is flat.  Musculoskeletal:        General: Normal range of motion.     Cervical back: Neck supple.  Skin:    General: Skin is warm and dry.     Comments: 2 cm x 2 cm area of swelling with central fluctuance and overlying erythema to the left lower extremity, just distal to the knee.  No surrounding erythema.  No warmth.  Exquisite TTP to the left lower extremity.  DP pulse 2+ bilaterally.  Sensation intact distally.  Compartments are soft.  Capillary refill normal.  Neurological:     Mental Status: She is alert and oriented to person, place, and time.  Psychiatric:        Mood and Affect: Mood normal.  Behavior: Behavior normal.     ED Results / Procedures / Treatments   Labs (all labs ordered are listed, but only abnormal results are displayed) Labs Reviewed  BASIC METABOLIC PANEL - Abnormal; Notable for the following components:      Result Value   Chloride 114 (*)    CO2 16 (*)    All other components within normal limits  CBC WITH DIFFERENTIAL/PLATELET - Abnormal; Notable for the following components:   RBC 5.29 (*)    All other components within normal limits  CULTURE, BLOOD (ROUTINE X 2)  CULTURE, BLOOD (ROUTINE X 2)  C DIFFICILE QUICK SCREEN W PCR REFLEX    URINALYSIS, W/ REFLEX TO CULTURE (INFECTION SUSPECTED)  I-STAT CG4 LACTIC ACID, ED    EKG None  Radiology No results found.  Procedures .Incision  and Drainage  Date/Time: 10/05/2023 3:09 PM  Performed by: Michelle Piper, PA-C Authorized by: Michelle Piper, PA-C   Consent:    Consent obtained:  Verbal   Consent given by:  Patient   Risks discussed:  Bleeding, incomplete drainage, pain, damage to other organs and infection   Alternatives discussed:  No treatment Universal protocol:    Procedure explained and questions answered to patient or proxy's satisfaction: yes     Relevant documents present and verified: yes     Test results available : yes     Required blood products, implants, devices, and special equipment available: yes     Site/side marked: yes     Immediately prior to procedure, a time out was called: yes     Patient identity confirmed:  Verbally with patient and arm band Location:    Type:  Abscess   Location:  Lower extremity   Lower extremity location:  Leg   Leg location:  L lower leg Pre-procedure details:    Skin preparation:  Chlorhexidine with alcohol and povidone-iodine Anesthesia:    Anesthesia method:  Local infiltration   Local anesthetic:  Lidocaine 1% w/o epi Procedure type:    Complexity:  Simple Procedure details:    Incision types:  Single straight   Incision depth:  Subcutaneous   Wound management:  Probed and deloculated, irrigated with saline and extensive cleaning   Drainage:  Purulent   Drainage amount:  Moderate Post-procedure details:    Procedure completion:  Tolerated well, no immediate complications     Medications Ordered in ED Medications  sodium chloride 0.9 % bolus 1,000 mL (1,000 mLs Intravenous New Bag/Given 10/05/23 1503)  HYDROmorphone (DILAUDID) injection 1 mg (1 mg Intravenous Given 10/05/23 1443)  metoCLOPramide (REGLAN) injection 10 mg (10 mg Intravenous Given 10/05/23 1450)  diphenhydrAMINE (BENADRYL) injection 12.5 mg (12.5 mg Intravenous Given 10/05/23 1450)  lidocaine (PF) (XYLOCAINE) 1 % injection 10 mL (10 mLs Other Given 10/05/23 1509)  LORazepam  (ATIVAN) injection 1 mg (1 mg Intravenous Given 10/05/23 1503)    ED Course/ Medical Decision Making/ A&P Clinical Course as of 10/05/23 1546  Tue Oct 05, 2023  1532 Discussed with infectious disease, Dr. [name] who recommends CT left lower extremity to rule out deep space infection, if deep consult orthopedics.  Superficial, stable for discharge with outpatient follow-up. give single dose of dalbavancin [AS]    Clinical Course User Index [AS] Malai Lady, Edsel Petrin, PA-C                                 Medical Decision Making Amount  and/or Complexity of Data Reviewed Labs: ordered.  Risk Prescription drug management.  This patient presents to the ED for concern of left knee pain, nausea, vomiting, diarrhea, this involves an extensive number of treatment options, and is a complaint that carries with it a high risk of complications and morbidity.  The differential diagnosis includes fracture, sprain, abscess, cellulitis, osteomyelitis  My initial workup includes labs, symptom control  Additional history obtained from: Nursing notes from this visit.  I ordered, reviewed and interpreted labs which include: CBC, BMP, cultures, lactate, urinalysis, C. difficile panel.  No leukocytosis or anemia.  No lactic acidosis.  No electrolyte derangement or kidney dysfunction.  Bicarb 16 within normal anion gap.  Consultations Obtained:  I requested consultation with infectious disease Dr. [name],  and discussed lab and imaging findings as well as pertinent plan -discussed in ED course  Afebrile, hemodynamically stable.  58 year old female presenting to the ED for evaluation of left lower extremity pain.  She has chronic pain and is under a pain management contract.  Pain worsened 4 days ago.  She noticed an abscess at that time.  Patient was agreeable to incision and drainage of the abscess here.  Purulent drainage was expressed.  Wound was irrigated as well.  Lab workup was overall very reassuring.  No  leukocytosis or lactic acidosis.  This does appear to be a superficial abscess, however I consulted with infectious disease who knows the patient very well.  Patient is only intermittently adherent with her antibiotics.  They recommend CT left lower extremity with contrast as ordered previously to rule out deeper space infection.  Also recommend single dose dalbavancin.  She was supposed to have surgery on Friday with [facility] orth[facility].  This has been postponed due to her infection.  This does not appear to be systemic, however cultures were obtained.  Care handed off to oncoming provider pending imaging.  Please see their note for final disposition and decision making.  Plan may change at the discretion of the oncoming provider.  Note: Portions of this report may have been transcribed using voice recognition software. Every effort was made to ensure accuracy; however, inadvertent computerized transcription errors may still be present.        Final Clinical Impression(s) / ED Diagnoses Final diagnoses:  Abscess  Chronic pain of left knee    Rx / DC Orders ED Discharge Orders     None         Michelle Piper, PA-C 10/05/23 1541    Elayne Snare K, DO 10/05/23 1603
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
## 2023-10-05 NOTE — ED Notes (Signed)
 Patient verbalizes understanding of discharge instructions. Opportunity for questioning and answers were provided. Armband removed by staff, pt discharged from ED. Wheeled out to lobby, picked up by son-in-law

## 2023-10-05 NOTE — ED Notes (Signed)
 Pt states she does not want Dalvance IV abx, states she is concerned about the negative GI side effects of abx that may occur. Pharmacy and PA notified

## 2023-10-05 NOTE — ED Provider Notes (Signed)
 Accepted handoff at shift change from [facility]. Please see prior provider note for full HPI.  Briefly: Patient is a 58 y.o. female who presents to the ER for left lower leg abscess. Hx of similar plus chronic osteomyelitis. Usually on doxycyline but recently started on linezolid, not tolerating the linezolid due to N/V/D.   DDX/Plan: ID physician Dr [name] recommended CT left leg with contrast to evaluate for deeper infection. If abscess was only superficial, can discharge with ID and ortho follow up. Recommended administering dalvance here.   Physical Exam  BP 125/86 (BP Location: Left Arm)   Pulse (!) 58   Temp (!) 97.5 F (36.4 C) (Oral)   Resp 18   SpO2 98%   Physical Exam Vitals and nursing note reviewed.  Constitutional:      Appearance: Normal appearance.  HENT:     Head: Normocephalic and atraumatic.  Eyes:     Conjunctiva/sclera: Conjunctivae normal.  Pulmonary:     Effort: Pulmonary effort is normal. No respiratory distress.  Skin:    General: Skin is warm and dry.  Neurological:     Mental Status: She is alert.  Psychiatric:        Mood and Affect: Mood normal.        Behavior: Behavior normal.    Results   Urinalysis, w/ Reflex to Culture (Infection Suspected) -Urine, Clean Catch   Collection Time: 10/05/23  5:30 PM  Result Value Ref Range   Specimen Source URINE, CLEAN CATCH    Color, Urine YELLOW YELLOW   APPearance HAZY (A) CLEAR   Specific Gravity, Urine 1.024 1.005 - 1.030   pH 6.0 5.0 - 8.0   Glucose, UA NEGATIVE NEGATIVE mg/dL   Hgb urine dipstick NEGATIVE NEGATIVE   Bilirubin Urine NEGATIVE NEGATIVE   Ketones, ur 20 (A) NEGATIVE mg/dL   Protein, ur 30 (A) NEGATIVE mg/dL   Nitrite POSITIVE (A) NEGATIVE   Leukocytes,Ua NEGATIVE NEGATIVE   RBC / HPF 0-5 0 - 5 RBC/hpf   WBC, UA 0-5 0 - 5 WBC/hpf   Bacteria, UA MANY (A) NONE SEEN   Squamous Epithelial / HPF 0-5 0 - 5 /HPF   Mucus PRESENT    CT TIBIA FIBULA LEFT WO CONTRAST Result Date:  10/05/2023 CLINICAL DATA:  Soft tissue mass left lower leg EXAM: CT OF THE LOWER LEFT EXTREMITY WITHOUT CONTRAST TECHNIQUE: Multidetector CT imaging of the lower left extremity was performed according to the standard protocol. RADIATION DOSE REDUCTION: This exam was performed according to the departmental dose-optimization program which includes automated exposure control, adjustment of the mA and/or kV according to patient size and/or use of iterative reconstruction technique. COMPARISON:  Plain films 07/28/2023 FINDINGS: Bones/Joint/Cartilage Remote posttraumatic and postsurgical changes in the proximal tibia. 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 The medial tibial plateau fragment is displaced and mildly impacted, stable appearance since prior plain films 07/28/2023. No hardware complicating feature. No bone destruction to suggest osteomyelitis. Ligaments Suboptimally assessed by CT. Muscles and Tendons Unremarkable Soft tissues Unremarkable.  No visible soft tissue mass. 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 Findings are unchanged since prior plain films from 07/28/2023. No acute bony abnormality. No visible soft tissue mass. Electronically Signed   By: Charlett Nose M.D.   On: 10/05/2023 20:10   ED Course / MDM   Clinical Course as of 10/05/23 2117  Tue Oct 05, 2023  1532 Discussed with infectious disease, Dr. [name] who recommends CT left lower extremity to rule out deep space infection, if deep consult orthopedics.  Superficial, stable for discharge with outpatient follow-up. give single dose of dalbavancin [AS]  2106 Called lab to add one urine culture [LR]    Clinical Course User Index [AS] Schutt, Edsel Petrin, PA-C [LR] Finlee [name], Lora Paula, PA-C   Medical Decision Making Amount and/or Complexity of Data Reviewed Labs:  ordered. Radiology: ordered.  Risk Prescription drug management.  1630 -- Unable to obtain adequate IV access for CT with contrast. Pt with IV in the hand, not sufficient per CT tech evaluation. Pt adamantly does not want to be stuck again for additional IV after 3+ attempts. Will obtain imaging without contrast.  2010 -- CT with no concerning findings, no soft tissue mass or acute bony abnormality. Will plan for dose of dalvance and discharge.  2115 -- Pt requesting home dose pain medication. Will give 10-325 norco, finish dalvance and dc with ID and ortho follow up.   2150 -- Pt refusing dalvance. Discussed with patient risks vs benefits of not receiving this medication. She is adamant that she does not want the medication. She wants to follow up with Dr Daiva Eves, her primary ID doctor, before taking any new medications. Explained I sent her urine for culture. She has had some dysuria, but again, is adamant she doesn't want any other antibiotics without following up with ID. She plans to call them tomorrow. The patient is safe for discharge and has been instructed to return immediately for worsening symptoms, change in symptoms or any other concerns.   Jeanella Flattery 10/05/23 2152    Alvira Monday, MD 10/06/23 1214

## 2023-10-05 NOTE — Discharge Instructions (Addendum)
 You were seen in the emergency department for an abscess.  We have drained the area and cleaned it. I would like you to have the wound checked in 2-3 days. This can be done by any doctor's office, urgent care, or emergency department. This is to make sure the area hasn't closed too soon. Try to keep the area as clean and dry as possible. It is okay to let warm soapy water run over the area, but do NOT scrub the area.   We discussed with Dr [name] with infectious disease and since you cannot tolerate the Linezolid, they wanted you to have the IV antibiotic called Dalvance. We have given that to you today. Please follow up with infectious disease and orthopedics as scheduled.  Continue to monitor how you're doing and return to the ER for new or worsening symptoms.

## 2023-10-05 NOTE — Progress Notes (Signed)
 Pharmacy Note:  Dalbavancin for Acute Bacterial Skin and Skin Structure Infection (ABSSSI) Patients to [facility] Discharge  Kerry Young is an 58 y.o. female who presented to [facility] on 10/05/2023 with an Acute Bacterial Skin and Skin Structure Infection  Patient has IV access?: Yes   ID MD [name] recommending dalbavancin and D/C w/ ID outpatient f/u if CT scan reassuring.  Give Dalbavancin 1500mg  IV once  Delmar Landau, PharmD, BCPS 10/05/2023 4:04 PM ED Clinical Pharmacist -  [phone]

## 2023-10-07 LAB — URINE CULTURE: Culture: >=100000 — AB

## 2023-10-08 ENCOUNTER — Telehealth (HOSPITAL_BASED_OUTPATIENT_CLINIC_OR_DEPARTMENT_OTHER): Payer: Self-pay | Admitting: Emergency Medicine

## 2023-10-08 NOTE — Telephone Encounter (Signed)
 Post ED Visit - Positive Culture Follow-up  Culture report reviewed by antimicrobial stewardship pharmacist: [facility] []  Enzo Bi, Pharm.D. []  Celedonio Miyamoto, Pharm.D., BCPS AQ-ID []  Garvin Fila, Pharm.D., BCPS []  Georgina Pillion, Pharm.D., BCPS []  [place], [address].D., BCPS, AAHIVP []  Estella Husk, Pharm.D., BCPS, AAHIVP []  Lysle Pearl, PharmD, BCPS []  Phillips Climes, PharmD, BCPS []  Agapito Games, PharmD, BCPS [x]  Ruben Im, PharmD []  Mervyn Gay, PharmD, BCPS []  Vinnie Level, PharmD  [facility] []  Len Childs, PharmD []  Greer Pickerel, PharmD []  Adalberto Cole, PharmD []  Perlie Gold, Rph []  Lonell Face) Jean Rosenthal, PharmD []  Earl Many, PharmD []  Junita Push, PharmD []  Dorna Leitz, PharmD []  Terrilee Files, PharmD []  Lynann Beaver, PharmD []  Keturah Barre, PharmD []  Loralee Pacas, PharmD []  Bernadene Person, PharmD   Positive urine culture No further patient follow-up is required at this time.  Kerry Young 10/08/2023, 1:16 PM

## 2023-10-10 LAB — CULTURE, BLOOD (ROUTINE X 2)
Culture: NO GROWTH
Special Requests: ADEQUATE

## 2023-10-12 ENCOUNTER — Telehealth: Payer: Self-pay

## 2023-10-12 NOTE — Telephone Encounter (Signed)
 Received call from Lysle Morales, NP with [facility] needing assistance with coordinating OPAT for patient.  Lamonda is admitted and had her hardware removed with an antibiotic space placed. They are going to be discharging her on IV dapto through 5/1.  Spoke with [facility] case Production designer, theatre/television/film, Lionel December. Will have [employer], Futures trader, reach out to [place] to assist with discharge and home health planning.   Allena Katz, NP will request that [facility] labs be faxed to [facility] as patient will be following with [place] and is scheduled for 4/7.  Craige Cotta: [phone]  Sandie Ano, RN

## 2023-10-17 ENCOUNTER — Other Ambulatory Visit: Payer: Self-pay

## 2023-10-17 ENCOUNTER — Emergency Department (HOSPITAL_COMMUNITY)

## 2023-10-17 ENCOUNTER — Encounter (HOSPITAL_COMMUNITY): Payer: Self-pay

## 2023-10-17 ENCOUNTER — Emergency Department (HOSPITAL_COMMUNITY)
Admission: EM | Admit: 2023-10-17 | Discharge: 2023-10-18 | Disposition: A | Discharge status: Home / Self Care | Attending: Emergency Medicine | Admitting: Emergency Medicine

## 2023-10-17 DIAGNOSIS — D649 Anemia, unspecified: Secondary | ICD-10-CM | POA: Insufficient documentation

## 2023-10-17 DIAGNOSIS — M25562 Pain in left knee: Secondary | ICD-10-CM | POA: Insufficient documentation

## 2023-10-17 LAB — CBC WITH DIFFERENTIAL/PLATELET
Abs Immature Granulocytes: 0.07 10*3/uL (ref 0.00–0.07)
Basophils Absolute: 0.1 10*3/uL (ref 0.0–0.1)
Basophils Relative: 1 %
Eosinophils Absolute: 0.6 10*3/uL — ABNORMAL HIGH (ref 0.0–0.5)
Eosinophils Relative: 6 %
HCT: 26.4 % — ABNORMAL LOW (ref 36.0–46.0)
Hemoglobin: 8.1 g/dL — ABNORMAL LOW (ref 12.0–15.0)
Immature Granulocytes: 1 %
Lymphocytes Relative: 28 %
Lymphs Abs: 2.8 10*3/uL (ref 0.7–4.0)
MCH: 26.2 pg (ref 26.0–34.0)
MCHC: 30.7 g/dL (ref 30.0–36.0)
MCV: 85.4 fL (ref 80.0–100.0)
Monocytes Absolute: 0.9 10*3/uL (ref 0.1–1.0)
Monocytes Relative: 9 %
Neutro Abs: 5.6 10*3/uL (ref 1.7–7.7)
Neutrophils Relative %: 55 %
Platelets: 423 10*3/uL — ABNORMAL HIGH (ref 150–400)
RBC: 3.09 MIL/uL — ABNORMAL LOW (ref 3.87–5.11)
RDW: 15.3 % (ref 11.5–15.5)
WBC: 10 10*3/uL (ref 4.0–10.5)
nRBC: 0 % (ref 0.0–0.2)

## 2023-10-17 LAB — BASIC METABOLIC PANEL WITH GFR
Anion gap: 7 (ref 5–15)
BUN: 8 mg/dL (ref 6–20)
CO2: 22 mmol/L (ref 22–32)
Calcium: 8.3 mg/dL — ABNORMAL LOW (ref 8.9–10.3)
Chloride: 108 mmol/L (ref 98–111)
Creatinine, Ser: 0.76 mg/dL (ref 0.44–1.00)
GFR, Estimated: >60 mL/min (ref >60)
Glucose, Bld: 99 mg/dL (ref 70–99)
Potassium: 3.8 mmol/L (ref 3.5–5.1)
Sodium: 137 mmol/L (ref 135–145)

## 2023-10-17 MED ORDER — HYDROMORPHONE HCL 1 MG/ML IJ SOLN
1.0000 mg | Freq: Once | INTRAMUSCULAR | Status: AC
  Administered 2023-10-17: 1 mg via INTRAVENOUS
  Filled 2023-10-17: qty 1

## 2023-10-17 MED ORDER — OXYCODONE HCL 5 MG PO TABS
10.0000 mg | ORAL_TABLET | Freq: Once | ORAL | Status: AC
  Administered 2023-10-17: 10 mg via ORAL
  Filled 2023-10-17: qty 2

## 2023-10-17 NOTE — ED Triage Notes (Signed)
 Pt BIBA from home, c/o left knee pain that has been worsening over the past couple of days.  Had a recent knee surgery about a month ago.  Severely swollen and is bleeding. VSS. Given by ambulance 100 mcg of fentanyl, 21.8mg  ketamine drip in NS over 10 mins. Relief with the ketamine. Has a PICC line.

## 2023-10-17 NOTE — ED Provider Notes (Signed)
 [place] EMERGENCY DEPARTMENT AT [facility] Provider Note   CSN: [id number] Arrival date & time: 10/17/23  1658     History {Add pertinent medical, surgical, social history, OB history to HPI:1} No chief complaint on file.   Kerry Young is a 58 y.o. female.  She has a history of chronic pain and osteomyelitis.  She had hardware removed from her knee at [facility] 9 days ago.  She said she was discharged on oxycodone which has not been controlling her pain.  She has not reached out to her pain clinic.  Since last evening she has had increased bloody drainage from one of her incision sites.  She denies any fever.  Pain is worse with any type of movement.  The history is provided by the patient.  Knee Pain Location:  Knee Knee location:  L knee Pain details:    Quality:  Throbbing   Severity:  Severe   Timing:  Constant   Progression:  Unchanged Chronicity:  Chronic Relieved by:  Nothing Worsened by:  Adduction and abduction Ineffective treatments: narcotics. Associated symptoms: decreased ROM and swelling   Associated symptoms: no fever        Home Medications Prior to Admission medications   Medication Sig Start Date End Date Taking? Authorizing Provider  pregabalin (LYRICA) 300 MG capsule Take 1 capsule (300 mg total) by mouth 2 (two) times daily. 07/24/22   Clapacs, Jackquline Denmark, MD      Allergies    Amitriptyline, Celexa [citalopram], Methocarbamol, Naproxen, Other, and Zofran [ondansetron]    Review of Systems   Review of Systems  Constitutional:  Negative for fever.  Respiratory:  Negative for shortness of breath.   Cardiovascular:  Negative for chest pain.  Skin:  Positive for wound.    Physical Exam Updated Vital Signs BP (!) 117/91 (BP Location: Right Arm)   Pulse 92   Temp 98.5 F (36.9 C) (Oral)   Resp (!) 21   SpO2 96%  Physical Exam Vitals and nursing note reviewed.  Constitutional:      General: She is not in acute distress.     Appearance: Normal appearance. She is well-developed.  HENT:     Head: Normocephalic and atraumatic.  Eyes:     Conjunctiva/sclera: Conjunctivae normal.  Cardiovascular:     Rate and Rhythm: Normal rate and regular rhythm.     Heart sounds: No murmur heard. Pulmonary:     Effort: Pulmonary effort is normal. No respiratory distress.     Breath sounds: Normal breath sounds.  Abdominal:     Palpations: Abdomen is soft.     Tenderness: There is no abdominal tenderness.  Musculoskeletal:        General: Swelling and tenderness present.     Cervical back: Neck supple.     Comments: Left knee with large surgical wound that is well-approximated.  There is some erythema.  No obvious fluctuance and no current drainage.  No crepitus.  She has well-perfused lower extremity and strong DP and PT pulses.  Cap refill brisk.  Skin:    General: Skin is warm and dry.     Capillary Refill: Capillary refill takes less than 2 seconds.  Neurological:     Mental Status: She is alert and oriented to person, place, and time.     Motor: No weakness.     ED Results / Procedures / Treatments   Labs (all labs ordered are listed, but only abnormal results are displayed)  Labs Reviewed  BASIC METABOLIC PANEL WITH GFR - Abnormal; Notable for the following components:      Result Value   Calcium 8.3 (*)    All other components within normal limits  CBC WITH DIFFERENTIAL/PLATELET - Abnormal; Notable for the following components:   RBC 3.09 (*)    Hemoglobin 8.1 (*)    HCT 26.4 (*)    Platelets 423 (*)    Eosinophils Absolute 0.6 (*)    All other components within normal limits    EKG None  Radiology DG Knee Complete 4 Views Left Result Date: 10/17/2023 CLINICAL DATA:  Left knee pain. EXAM: LEFT KNEE - COMPLETE 4+ VIEW COMPARISON:  August 13, 2023.  October 05, 2023. FINDINGS: There is been interval placement of some form of cement involving the distal left femur and tibial plateau. There is complete  posterior dislocation of the proximal portions of the left tibia and fibula which is more pronounced since prior exam. No definite acute fracture is noted. IMPRESSION: Interval placement of some form of cement involving the distal left femur and tibial plateau. There is noted complete posterior dislocation of the proximal portions of the left tibia and fibula relative distal femur which is more pronounced than prior exam. Electronically Signed   By: Lupita Raider M.D.   On: 10/17/2023 19:11   DG Chest Port 1 View Result Date: 10/17/2023 CLINICAL DATA:  Check PICC line placement EXAM: PORTABLE CHEST 1 VIEW COMPARISON:  June 29, 2021, February 07, 2021 FINDINGS: The cardiomediastinal silhouette is unchanged in contour.LEFT upper extremity PICC tip terminates over the superior cavoatrial junction. No pleural effusion. No pneumothorax. No acute pleuroparenchymal abnormality. Rounded lucency throughout the RIGHT humeral head. IMPRESSION: 1. LEFT upper extremity PICC tip terminates over the superior cavoatrial junction. 2. Rounded lucency throughout the RIGHT humeral head may reflect sequela of prior fracture; recommend correlation with any symptomatology with consideration of additional radiographs if clinically indicated Electronically Signed   By: Meda Klinefelter M.D.   On: 10/17/2023 18:20    Procedures Procedures  {Document cardiac monitor, telemetry assessment procedure when appropriate:1}  Medications Ordered in ED Medications  HYDROmorphone (DILAUDID) injection 1 mg (1 mg Intravenous Given 10/17/23 1818)  HYDROmorphone (DILAUDID) injection 1 mg (1 mg Intravenous Given 10/17/23 1916)  HYDROmorphone (DILAUDID) injection 1 mg (1 mg Intravenous Given 10/17/23 2003)    ED Course/ Medical Decision Making/ A&P Clinical Course as of 10/17/23 2017  Sun Oct 17, 2023  1842 hemoglobin 5 days ago was 8.5. [MB]  1936 Reviewed case and imaging with [facility] orthopedics on-call Dr. Dorris Carnes.  He said that due  to her procedure that knee is going to float around a lot and he would recommend putting her in a knee immobilizer and having her call her orthopedic doctor in the morning.   [MB]  2015 Patient was given more IV pain medication and placed in knee immobilizer.  She said they usually send her home by ambulance that her insurance covers.  Nurses are to work with her on that.  I recommended that she talk to her orthopedic doctor tomorrow and she probably also needs to interface with her pain doctor as she does not seem to have adequate pain control. [MB]    Clinical Course User Index [MB] Terrilee Files, MD   {   Click here for ABCD2, HEART and other calculatorsREFRESH Note before signing :1}  Medical Decision Making Amount and/or Complexity of Data Reviewed Labs: ordered. Radiology: ordered.  Risk Prescription drug management.   This patient complains of ***; this involves an extensive number of treatment Options and is a complaint that carries with it a high risk of complications and morbidity. The differential includes ***  I ordered, reviewed and interpreted labs, which included *** I ordered medication *** and reviewed PMP when indicated. I ordered imaging studies which included *** and I independently    visualized and interpreted imaging which showed *** Additional history obtained from *** Previous records obtained and reviewed *** I consulted *** and discussed lab and imaging findings and discussed disposition.  Cardiac monitoring reviewed, *** Social determinants considered, *** Critical Interventions: ***  After the interventions stated above, I reevaluated the patient and found *** Admission and further testing considered, ***   {Document critical care time when appropriate:1} {Document review of labs and clinical decision tools ie heart score, Chads2Vasc2 etc:1}  {Document your independent review of radiology images, and any outside  records:1} {Document your discussion with family members, caretakers, and with consultants:1} {Document social determinants of health affecting pt's care:1} {Document your decision making why or why not admission, treatments were needed:1} Final Clinical Impression(s) / ED Diagnoses Final diagnoses:  None    Rx / DC Orders ED Discharge Orders     None

## 2023-10-17 NOTE — Discharge Instructions (Signed)
 You were seen in the emergency department for continued left knee pain and new bloody drainage from your incision.  Your incision did not show any signs of bleeding at this time and there was no evidence of infection.  You were placed in a knee immobilizer.  Please call your orthopedic doctor in the morning for close follow-up.  You also should reach out to your pain specialist for adjustment in your pain regiment.

## 2023-10-25 ENCOUNTER — Ambulatory Visit: Payer: Self-pay | Admitting: Internal Medicine

## 2023-10-27 ENCOUNTER — Encounter (HOSPITAL_COMMUNITY): Payer: Self-pay

## 2023-10-27 ENCOUNTER — Other Ambulatory Visit: Payer: Self-pay

## 2023-10-27 ENCOUNTER — Emergency Department (HOSPITAL_COMMUNITY)
Admission: EM | Admit: 2023-10-27 | Discharge: 2023-10-28 | Discharge status: Against Medical Advice | Attending: Emergency Medicine | Admitting: Emergency Medicine

## 2023-10-27 DIAGNOSIS — Z5321 Procedure and treatment not carried out due to patient leaving prior to being seen by health care provider: Secondary | ICD-10-CM | POA: Diagnosis not present

## 2023-10-27 DIAGNOSIS — G8929 Other chronic pain: Secondary | ICD-10-CM | POA: Diagnosis not present

## 2023-10-27 DIAGNOSIS — M25562 Pain in left knee: Secondary | ICD-10-CM | POA: Insufficient documentation

## 2023-10-27 NOTE — ED Notes (Signed)
 Per EMS, while in route, pt made the comment that she knew where the pain medication was in the ambulance, and told the EMS personal "give me the pain meds, I need the pain meds".

## 2023-10-27 NOTE — ED Triage Notes (Signed)
 Pt had knee surgery on left knee, c/o increasing pain, swelling, and purulent drainage. Pt unable to bend knee, knee in immobilizer.

## 2023-10-28 LAB — LAB REPORT - SCANNED
Calcium: 9.2
EGFR: 97

## 2023-11-10 ENCOUNTER — Telehealth: Payer: Self-pay

## 2023-11-10 NOTE — Telephone Encounter (Signed)
 Pt was seen on 09/10/23.

## 2023-11-10 NOTE — Telephone Encounter (Signed)
 Patient called office stating she would like to speak with Dr. Ernie Heal regarding upcoming knee surgery with [facility] in three weeks. Is supposed to finish IV antibiotics on 5/1.  Main concern is possibility of infection after surgery.  Julien Odor, RMA

## 2023-11-16 ENCOUNTER — Telehealth: Payer: Self-pay

## 2023-11-16 ENCOUNTER — Encounter: Payer: Self-pay | Admitting: Internal Medicine

## 2023-11-16 NOTE — Telephone Encounter (Signed)
 Spoke with patient regarding appt on 5/1. Pt is adamant she is not able to make it in person. Requested to cancel and reschedule with DR. [facility]. Appt rescheduled for 5/28.  Advised pt to reach out to surgeon regarding concerns with Antbx and knee surgery since she has been following up with them.

## 2023-11-16 NOTE — Telephone Encounter (Signed)
 Patient called to reschedule appt on 5/1. States she would prefer to see Dr. Ernie Heal since she is most comfortable with him. Informed caller that provider does not have any earlier appts until the end of May. Is supposed to end Iv antbx 5/1. Pt would like to know if she can convert appt to virtual due to recent surgery. Is not sure she will have transportation to make it to visit or she will be able to walk down stairs with pain.  Julien Odor, RMA

## 2023-11-16 NOTE — Telephone Encounter (Signed)
 We have OPAT labs on her which will continue to be checked thru end date of 5/1 unless that end date changes - thanks!

## 2023-11-17 ENCOUNTER — Telehealth: Payer: Self-pay

## 2023-11-17 DIAGNOSIS — T847XXD Infection and inflammatory reaction due to other internal orthopedic prosthetic devices, implants and grafts, subsequent encounter: Secondary | ICD-10-CM

## 2023-11-17 DIAGNOSIS — M8660 Other chronic osteomyelitis, unspecified site: Secondary | ICD-10-CM

## 2023-11-17 MED ORDER — DOXYCYCLINE HYCLATE 100 MG PO TABS
100.0000 mg | ORAL_TABLET | Freq: Two times a day (BID) | ORAL | 0 refills | Status: AC
Start: 2023-11-17 — End: 2023-12-17

## 2023-11-17 NOTE — Telephone Encounter (Signed)
 OPAT patient with end date of 5/1. OPAT was arranged by [facility] while Kerry Young was admitted at [facility]. She has not come to any of her follow up appointments with [facility].   Per Dr. Ernie Heal, remaining OPAT and PICC orders will need to be managed by [facility] since [facility] has not seen her for this episode of care.   Called Surenity on all numbers listed in chart to advise her to reach out to [facility] regarding PICC management and next steps, no answer on any number. Left two voicemails requesting call back.   Renelle Carte, NP with [facility] [phone]  Arlon Bergamo, BSN, RN

## 2023-11-17 NOTE — Addendum Note (Signed)
 Addended by: Antawn Sison M on: 11/17/2023 01:26 PM   Modules accepted: Orders

## 2023-11-17 NOTE — Telephone Encounter (Signed)
 Per Dr. Ernie Heal "well I actually spent more time re this patient by looking into care everywhere. If she has not had input from someone at [facility] I would recommend ending her daptomycin  at the intended stop date and switching her to doxycycline  100mg  po bid until someone can either have a discussion with her team at [facility] or she has an actual in person visit at [facility] where we can spend moretime going thru all of her stuff" Pt has appt with Dr. Ernie Heal on 5/28. Will send in doxy script to her pharmacy. Left message with [facility] to confirm orders for picc removal. Also to ask which provider is signing off on orders. Will speak with pt to ensure she keeps app that is scheduled.   Julien Odor, RMA

## 2023-11-18 ENCOUNTER — Ambulatory Visit: Admitting: Internal Medicine

## 2023-11-18 NOTE — Telephone Encounter (Signed)
 Pull picc orders relayed to [facility]. Per Amy they did not have orders on file.  Spoke with patient regarding doxy rx and upcoming appt. Understands she will need to keep upcoming appt. Would like to speak with provider regarding referral for different surgeon. Does not want to be seen at [facility]. Julien Odor, RMA

## 2023-11-20 LAB — LAB REPORT - SCANNED: EGFR: 79

## 2023-12-01 ENCOUNTER — Telehealth: Payer: Self-pay

## 2023-12-01 NOTE — Telephone Encounter (Signed)
 Patient called after leaving ortho office. Was told that her knee is dislocated and will need to have surgery for metal rod placement. Surgery scheduled for 5/19. Patient is anxious about surgery since she just completed oral antibiotics and would to know if she should be switched back to Iv before surgery.  Patient has appt with ID on 5/28.  Julien Odor, RMA

## 2023-12-03 ENCOUNTER — Encounter: Payer: Self-pay | Admitting: Internal Medicine

## 2023-12-06 ENCOUNTER — Emergency Department (HOSPITAL_COMMUNITY)

## 2023-12-06 ENCOUNTER — Emergency Department (HOSPITAL_COMMUNITY)
Admission: EM | Admit: 2023-12-06 | Discharge: 2023-12-06 | Discharge status: Other Acute Inpatient Hospital | Attending: Emergency Medicine | Admitting: Emergency Medicine

## 2023-12-06 ENCOUNTER — Other Ambulatory Visit: Payer: Self-pay

## 2023-12-06 DIAGNOSIS — M00862 Arthritis due to other bacteria, left knee: Secondary | ICD-10-CM | POA: Insufficient documentation

## 2023-12-06 DIAGNOSIS — W07XXXA Fall from chair, initial encounter: Secondary | ICD-10-CM | POA: Diagnosis not present

## 2023-12-06 DIAGNOSIS — M25562 Pain in left knee: Secondary | ICD-10-CM | POA: Diagnosis present

## 2023-12-06 DIAGNOSIS — B9689 Other specified bacterial agents as the cause of diseases classified elsewhere: Secondary | ICD-10-CM | POA: Diagnosis not present

## 2023-12-06 DIAGNOSIS — M009 Pyogenic arthritis, unspecified: Secondary | ICD-10-CM

## 2023-12-06 LAB — BASIC METABOLIC PANEL WITH GFR
Anion gap: 9 (ref 5–15)
BUN: 10 mg/dL (ref 6–20)
CO2: 18 mmol/L — ABNORMAL LOW (ref 22–32)
Calcium: 8.8 mg/dL — ABNORMAL LOW (ref 8.9–10.3)
Chloride: 111 mmol/L (ref 98–111)
Creatinine, Ser: 0.72 mg/dL (ref 0.44–1.00)
GFR, Estimated: >60 mL/min (ref >60)
Glucose, Bld: 97 mg/dL (ref 70–99)
Potassium: 3.8 mmol/L (ref 3.5–5.1)
Sodium: 138 mmol/L (ref 135–145)

## 2023-12-06 LAB — CBC WITH DIFFERENTIAL/PLATELET
Abs Immature Granulocytes: 0.02 10*3/uL (ref 0.00–0.07)
Basophils Absolute: 0.1 10*3/uL (ref 0.0–0.1)
Basophils Relative: 1 %
Eosinophils Absolute: 0.2 10*3/uL (ref 0.0–0.5)
Eosinophils Relative: 2 %
HCT: 37.2 % (ref 36.0–46.0)
Hemoglobin: 11.6 g/dL — ABNORMAL LOW (ref 12.0–15.0)
Immature Granulocytes: 0 %
Lymphocytes Relative: 25 %
Lymphs Abs: 2.4 10*3/uL (ref 0.7–4.0)
MCH: 24.9 pg — ABNORMAL LOW (ref 26.0–34.0)
MCHC: 31.2 g/dL (ref 30.0–36.0)
MCV: 80 fL (ref 80.0–100.0)
Monocytes Absolute: 0.7 10*3/uL (ref 0.1–1.0)
Monocytes Relative: 7 %
Neutro Abs: 6.2 10*3/uL (ref 1.7–7.7)
Neutrophils Relative %: 65 %
Platelets: 388 10*3/uL (ref 150–400)
RBC: 4.65 MIL/uL (ref 3.87–5.11)
RDW: 14.4 % (ref 11.5–15.5)
WBC: 9.6 10*3/uL (ref 4.0–10.5)
nRBC: 0 % (ref 0.0–0.2)

## 2023-12-06 LAB — SEDIMENTATION RATE: Sed Rate: 5 mm/h (ref 0–22)

## 2023-12-06 LAB — C-REACTIVE PROTEIN: CRP: 0.7 mg/dL (ref <1.0)

## 2023-12-06 MED ORDER — HYDROMORPHONE HCL 1 MG/ML IJ SOLN
1.0000 mg | Freq: Once | INTRAMUSCULAR | Status: AC
  Administered 2023-12-06: 1 mg via INTRAVENOUS
  Filled 2023-12-06: qty 1

## 2023-12-06 NOTE — Discharge Summary (Signed)
 Orthopedic Surgery Discharge Summary  Patient ID: Kerry Young [id number] 58 y.o. Jun 19, 1966  Admit date: 12/06/2023 Admitting Physician: Glendia Ambrose Blush, MD Admission Diagnoses:  Principal Problem (Resolved):   Knee dislocation, left, initial encounter Active Problems:   Painful orthopaedic hardware    Discharge date and time: 12/07/2023   Discharge Physician: Glendia Ambrose Blush, MD Discharge Diagnoses:  Principal Problem (Resolved):   Knee dislocation, left, initial encounter Active Problems:   Painful orthopaedic hardware     Surgical operations performed during hospitalization: Procedure(s) (LRB): ARTHROTOMY KNEE and revision of antibiotic spacer (Left)   Indications for Operative Procedures: Knee dislocation, left, initial encounter [S83.105A] Painful orthopaedic hardware [facility] Course:   The patient was taken to the operating room on 12/06/2023 and underwent the above stated surgical procedure without complications.  For details of the operative procedure, please refer to the operative note.  The patient was then transferred to the post-op Orthopaedic floor for recovery, physical therapy and discharge planning. The patient was given Aspirin  81 mg BID x 6 weeks for the prevention of DVTs, along with early ambulation and sequential compression devices.   The patient had adequate pain control with po pain medications and IV pain medications for breakthrough pain. At the time of discharge, the patient's pain was well controlled with po pain medications.  Physical and Occupational therapy were initiated and worked with the patient towards discharge goals with clearance from PT for discharge home by the time of discharge.   The patient will be TTWB to the left side with left knee immobilizer at all times.The patient has a Aquacel dressing in place to the surgical incision. This can be removed 12/17/23. At the time of discharge, the patient is afebrile, vital  signs are stable and the patient is in no acute distress. Compartments are soft. Peripheral pulses are 2+ bilaterally. Hemovac drain removed just prior to discharge after 338 mL of bloody drainage since surgery. The patient is neurovascularly intact with an exam as per the last progress note. The patient is medically stable and safe for discharge home to continue their rehabilitation.   Consults:  None  Disposition: Home  Patient Instructions:     Medication List     START taking these medications    acetaminophen  500 mg tablet Commonly known as: TYLENOL  Take 2 tablets (1,000 mg total) by mouth every 6 (six) hours.   aspirin  81 mg EC tablet Take 1 tablet (81 mg total) by mouth 2 (two) times a day   HYDROmorphone  4 mg tablet Commonly known as: DILAUDID  Take ONE to ONE HALF tablet (4-6mg  total) by mouth every 4 hours as needed for moderate pain or severe pain. (Take 1-1.5 tablets (4-6 mg total) by mouth every 4 (four) hours as needed for moderate pain (4-6) or severe pain (7-10).)       CHANGE how you take these medications    tiZANidine  4 mg tablet Commonly known as: ZANAFLEX  Take 1 tablet (4 mg total) by mouth every 6 (six) hours as needed for muscle spasms. What changed:  when to take this reasons to take this       CONTINUE taking these medications    cholecalciferol 1,000 unit (25 mcg) tablet Commonly known as: VITAMIN D3 Take 1 each (1,000 Units total) by mouth daily.   doxycycline  100 mg tablet Commonly known as: VIBRA -TABS Take 100 mg by mouth 2 (two) times a day.   naloxone  4 mg/actuation Spry nasal spray Commonly known as:  Narcan  For suspected opioid overdose, call 911 and give a single spray into ONE nostril. If no or minimal response after 3 minutes, give another spray in OTHER nostril. (Administer 1 spray into affected nostril(s) as needed for opioid reversal. Spray the contents of 1 device into 1 nostril. Call 911. May repeat with 2nd device in  alternate nostril if no response in 2-3 minutes.)   pregabalin  300 mg capsule Commonly known as: LYRICA  Take 300 mg by mouth 2 (two) times a day.   senna 8.6 mg tablet Generic drug: senna Take 1 tablet (8.6 mg total) by mouth daily.       STOP taking these medications    ibuprofen  200 mg tablet Commonly known as: MOTRIN    oxyCODONE  5 mg immediate release tablet Commonly known as: ROXICODONE          Where to Get Your Medications     These medications were sent to [facility]  [address] [place], [place] [address]    Hours: Mon-Fri: 8:30am - 5pm; Sat-Sun: Closed; Holidays: Closed Phone: [phone]  acetaminophen  500 mg tablet aspirin  81 mg EC tablet HYDROmorphone  4 mg tablet tiZANidine  4 mg tablet       The   Controlled Substance Reporting System was reviewed for this patient prior to providing discharge narcotic prescription.   Activity: activity as tolerated and no driving while on analgesics Diet: regular diet Wound Care: keep wound clean and dry, reinforce dressing PRN, ice to area for comfort, and as directed Weight Bearing Status: TTWB x 6 weeks DVT PPX: EC ASA 81 mg twice daily x 6 weeks   Patient transported via EMS postop back home where family will resume care Please contact your doctor or seek medical assistance if you experience any of the following:  1. Fever greater than 101.5 F. 2. Decrease in urinary output. 3. Increased warmth, swelling, redness, or pain at your incision/wound site. 4. Increased drainage or bad odor at your incision/wound site.  5. Nausea/vomiting that does not stop.  6. Numbness, tingling, or discoloration of extremity.  7. Unable to drink fluids.  8. Uncontrollable pain.  9. Any other concerning symptoms.   Please, call 911 or go to your nearest emergency room if you experience any of the following:  1. Change in speech, vision, or ability to walk 2. Chest pain.  3. Difficulty  breathing or shortness of breath.  Follow-up:  Future Appointments  Date Time Provider Department Center  12/22/2023  2:00 PM Duwaine Almarie Reusing, DEVONNA [facility] ORT [facility] None  01/19/2024  1:00 PM Glendia Ambrose Blush, MD [facility] ORT [facility] None    April Rosaline Gamer, NP

## 2023-12-06 NOTE — ED Triage Notes (Signed)
 According to [facility]: Pt stats she fail trying to transition to wheelchair unto floor.Pt states she landed hard on side and buttocks.  Pt appears to have deformities to left knee, distal pulses good.  C collar applied by ems.  Vitals: 128/72 Hr 70 Spo2 99%   20 gauge piv in left hand.  Pt had 200 mcg of fentanyl  from ems.

## 2023-12-06 NOTE — ED Notes (Signed)
 Pt left in care of [facility].No new onset distress.

## 2023-12-06 NOTE — ED Notes (Signed)
 Phlebotomy asked to grab labs

## 2023-12-06 NOTE — ED Notes (Signed)
 Called for pt pick [facility], [place] [phone], pt going to [facility]

## 2023-12-06 NOTE — ED Provider Notes (Signed)
 [place] EMERGENCY DEPARTMENT AT [facility] Provider Note   CSN: [id number] Arrival date & time: 12/06/23  5621     History  Chief Complaint  Patient presents with   Knee Pain    Kerry Young is a 58 y.o. female with a history of septic joint of the left knee, chronic pain syndrome, and anxiety presents the ED today after a fall.  Patient reports that she was trying to get in her wheelchair by herself this morning when she missed the chair and landed on her right buttock and right side.  Denies any head injury or LOC.  Denies any pain on her right side or buttock from the fall but endorses left knee pain, which is chronic.  She did not hurt her knee in the fall. She is supposed to have surgery with [facility] this morning but is asking if she can get it done here.  She called EMS because her left knee is causing her a great deal of pain and did not know if she would be able to make it to the hospital herself for her surgery today.    Home Medications Prior to Admission medications   Medication Sig Start Date End Date Taking? Authorizing Provider  oxyCODONE  (OXY IR/ROXICODONE ) 5 MG immediate release tablet Take 5 mg by mouth 4 (four) times daily as needed for moderate pain (pain score 4-6) or severe pain (pain score 7-10).   Yes [provider]  pregabalin  (LYRICA ) 300 MG capsule Take 1 capsule (300 mg total) by mouth 2 (two) times daily. 07/24/22  Yes Clapacs, Elida Grounds, MD  tiZANidine  (ZANAFLEX ) 4 MG tablet Take 1 tablet by mouth every 6 (six) hours as needed for muscle spasms. 11/26/23 12/06/23 Yes [provider]  doxycycline  (VIBRA -TABS) 100 MG tablet Take 1 tablet (100 mg total) by mouth 2 (two) times daily. Patient not taking: Reported on 12/06/2023 11/17/23 12/17/23  Ernie Heal, Jerelyn Money, MD      Allergies    Amitriptyline, Celexa  [citalopram ], Methocarbamol , Naproxen , Other, and Zofran  [ondansetron ]    Review of Systems   Review of Systems  Musculoskeletal:   Positive for arthralgias.  All other systems reviewed and are negative.   Physical Exam Updated Vital Signs BP 124/85   Pulse 78   Temp 97.9 F (36.6 C)   Resp 18   Ht 5\' 4"  (1.626 m)   Wt 99.8 kg   SpO2 100%   BMI 37.76 kg/m  Physical Exam Vitals and nursing note reviewed.  Constitutional:      General: She is not in acute distress.    Appearance: Normal appearance.  HENT:     Head: Normocephalic and atraumatic.     Mouth/Throat:     Mouth: Mucous membranes are moist.  Eyes:     Conjunctiva/sclera: Conjunctivae normal.     Pupils: Pupils are equal, round, and reactive to light.  Cardiovascular:     Rate and Rhythm: Normal rate and regular rhythm.     Pulses: Normal pulses.     Heart sounds: Normal heart sounds.  Pulmonary:     Effort: Pulmonary effort is normal.     Breath sounds: Normal breath sounds.  Abdominal:     Palpations: Abdomen is soft.     Tenderness: There is no abdominal tenderness.  Musculoskeletal:        General: Normal range of motion.     Cervical back: Normal range of motion. No tenderness.     Right lower  leg: No edema.     Left lower leg: No edema.     Comments: Tenderness to left knee, thigh, and calf - which patient states isn't due to the fall. Palpable dorsalis pedis pulses bilaterally. No neck or back tenderness to palpation. ROM, strength, and sensation of upper extremities intact bilaterally. Some numbness to left lateral foot. Range of motion and strength impaired on left lower extremity. Tenderness to palpation of right lower extremity with ROM, strength, and sensation intact.  Skin:    General: Skin is warm and dry.     Findings: No rash.  Neurological:     General: No focal deficit present.     Mental Status: She is alert.     Sensory: No sensory deficit.     Motor: No weakness.  Psychiatric:        Mood and Affect: Mood normal.        Behavior: Behavior normal.    ED Results / Procedures / Treatments   Labs (all labs ordered  are listed, but only abnormal results are displayed) Labs Reviewed  CBC WITH DIFFERENTIAL/PLATELET - Abnormal; Notable for the following components:      Result Value   Hemoglobin 11.6 (*)    MCH 24.9 (*)    All other components within normal limits  BASIC METABOLIC PANEL WITH GFR - Abnormal; Notable for the following components:   CO2 18 (*)    Calcium 8.8 (*)    All other components within normal limits  SEDIMENTATION RATE  C-REACTIVE PROTEIN    EKG None  Radiology DG Knee Complete 4 Views Left Result Date: 12/06/2023 CLINICAL DATA:  Left knee pain. EXAM: LEFT KNEE - COMPLETE 4+ VIEW COMPARISON:  Left knee radiograph dated 10/17/2023. FINDINGS: There is dorsal displacement of the proximal tibial cement in relation to the tibia and femur. No acute fracture. The bones are osteopenic. No joint effusion. The soft tissues are unremarkable. IMPRESSION: Dorsal displacement of the proximal tibial cement in relation to the tibia and femur. Electronically Signed   By: Angus Bark M.D.   On: 12/06/2023 10:01    Procedures Procedures    Medications Ordered in ED Medications  HYDROmorphone  (DILAUDID ) injection 1 mg (1 mg Intravenous Given 12/06/23 0825)  HYDROmorphone  (DILAUDID ) injection 1 mg (1 mg Intravenous Given 12/06/23 1007)    ED Course/ Medical Decision Making/ A&P Clinical Course as of 12/06/23 1232  Mon Dec 06, 2023  1006 Discussed with Dr Elvan Hamel who is aware of the spacer dislocation and possible nerve injury.  [RP]    Clinical Course User Index [RP] Ninetta Basket, MD                                 Medical Decision Making Amount and/or Complexity of Data Reviewed Labs: ordered. Radiology: ordered.  Risk Prescription drug management.   This patient presents to the ED for concern of left knee pain, this involves an extensive number of treatment options, and is a complaint that carries with it a high risk of complications and morbidity.   Differential  diagnosis includes: chronic vc acute pain, nerve damage, etc.   Comorbidities  See HPI above   Additional History  Additional history obtained from prior records   Lab Tests  I ordered and personally interpreted labs.  The pertinent results include:   BMP and CBC are reassuring   Imaging Studies  I ordered imaging studies including left  knee x-ray  I independently visualized and interpreted imaging which showed:  Dorsal displacement of the proximal tibial cement in relation to tibia and femur I agree with the radiologist interpretation   Consultations  I requested consultation with Dr. Elvan Hamel with orthopedics at [facility],  and discussed lab and imaging findings as well as pertinent plan - they recommend: transfer patient to PACU for procedure as scheduled. Dr. Efraim Grange spoke with Dr. Elvan Hamel and made him aware of x-ray findings as well as possible left foot drop/lateral numbness prior to transfer.   Problem List / ED Course / Critical Interventions / Medication Management  Patient presents the ED via EMS after a fall while transferring to her wheelchair.  Patient fell on her right side/buttock but is complaining of left knee pain.  States that she has a septic arthritis of the left knee and is was to have surgery on it this morning.  Dates that she called EMS because she did not know if she could make it to the hospital second to her pain.  Her procedure is supposed to be at an [facility] facility in [place]. Patient staffed with my attending, Dr. Efraim Grange. SBP of 130 in right leg, SBP of 120 in left leg. Possible nerve damage second to posterior dislocation of spacer. I ordered medications including: Dilaudid  for knee pain  Reevaluation of the patient after these medicines showed that the patient improved I have reviewed the patients home medicines and have made adjustments as needed   Social Determinants of Health  Physical activity   Test / Admission -  Considered  Patient stable and safe for transfer.       Final Clinical Impression(s) / ED Diagnoses Final diagnoses:  Septic arthritis of left knee joint [facility])    Rx / DC Orders ED Discharge Orders     None         Sonnie Dusky, PA-C 12/06/23 1232    Ninetta Basket, MD 12/14/23 1544

## 2023-12-14 ENCOUNTER — Telehealth: Payer: Self-pay | Admitting: Infectious Disease

## 2023-12-14 NOTE — Telephone Encounter (Signed)
 FYI: Spoke with patient regarding question. Since she has not been evaluated by MD he is not able to advise what course to take for antibiotics. Advised she reach out to her surgeon regarding concerns Julien Odor, RMA

## 2023-12-14 NOTE — Telephone Encounter (Signed)
 Kerry Young called to cancel her upcoming appointment due to recently having surgery. Pt stated the surgeon told her to stop taking the antibiotic but she would like Dr. Ernie Heal to advise if she should or not. [name] can be reached at [phone].

## 2023-12-15 ENCOUNTER — Ambulatory Visit: Admitting: Infectious Disease

## 2023-12-15 NOTE — Telephone Encounter (Signed)
 Attempted to contact patient with Dr. Sanjuan Crumbly recommendations that he agrees with surgeon to stop antibiotics.   Sending patient a Clinical cytogeneticist message as well.   Hendricks Locker, LPN
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
## 2023-12-29 NOTE — Progress Notes (Signed)
 [facility]  New Patient Consultation Note  Referring Doctor: Tanda Glendia Baller, MD Primary Doctor: No primary care provider on file.  Kerry Young is a 58 y.o. old female being seen today by Shelly Charity MD at the consultation request of Tanda Glendia Baller, MD for new patient office visit.  Medical records reviewed today for this visit: Orthopedic outside pain medicine notes via care everywhere tab  Chief Complaint:  Chief Complaint  Patient presents with  . Knee Pain    left  . Leg Pain    left  . New Patient   History of Present Illness:  Patient is here today for evaluation of her chronic musculoskeletal pain in the setting of multiple knee surgeries including most recently a left knee arthrotomy and revision of antibiotic spacer.'s most recent surgery was completed on 03/07/2024.  The patient is also followed at [facility] health by infectious disease.  Patient was previously a patient of Dr. Waylon at [facility] for her chronic pain.  Per review of Dr. Kristie notes on care everywhere, the patient is no longer on long-term opiate therapy at his office secondary to violation of her OTA. patient denies fevers, chills, ataxia, falls, progressive weakness, saddle anesthesia, and bladder/bowel changes. Additional pain history/assessment/scores noted below:  Per review of intake: Pain Description: The pain started 2-5 years ago after a fall. Since the pain first started, it is worse. The most significant location of pain is the left knee. The 1-2 words that best describe the pain: sharp, stabbing, shooting, throbbing, electric, prickling, crampy, and constant. The pain improves with rest, laying down, and constant. The pain is made worse with most movements and general daily activities. The average daily pain score is 7/10. The pain can be as high as 9/10 at its worst. The pain interferes with: All activities, Doing  housework, and Sleeping.   Therapies: Previously attempted interventions for this pain concern: none   Has physical therapy been initiated or completed for this pain concern? No   Has chiropractic been completed for this pain concern? No   Medications: Currently utilized over the counter medications/therapy for pain: None   Currently utilized prescription medications/therapies for pain: Membrane stabilizers (gabapentin /lyrica ) and Other: Dilaudid    Currently utilized controlled medications: Pure opioid agonist (Oxycodone /Hydrocodone /Dilaudid /Nucynta/Fentanyl /Methadone)   Is the patient on a blood thinner (including aspirin , Goody/BC/Bayer powder)? No  A validated pain assessment tool was completed today: What number best describes your pain on average in the past week?: 6 What number best describes how, during the past week, pain has interfered with your enjoyment of life?: 10 - Completely interferes What number best describes how, during the past week, pain has interfered with your general activity?: 10 - Completely interferes PEG Pain Total Score: 8.67   Review of Systems: A complete ROS was performed with positive/negative pertinent findings listed in HPI.  ____________________________________________________________________ Past Medical History: Medical History[1]   Past Surgical History: Surgical History[2]  Family History: Family History[3]  Social History: Social History   Tobacco Use  . Smoking status: Former    Current packs/day: 0.00    Types: Cigarettes    Quit date: 03/30/2023    Years since quitting: 0.7  . Smokeless tobacco: Never  Substance Use Topics  . Alcohol use: Not on file    Allergies: Oxycodone -acetaminophen   Current Medications: Current Medications[4]  Review of Systems:  ROS completed on pain intake sheet reviewed, pertinent positive/negative findings related to this  visit are noted in the HPI and Assessment/Plan.   ____________________________________________________________________ Vitals: Vitals:   12/29/23 1124  BP: 108/83  BP Location: Left arm  Patient Position: Sitting  Pulse: 88  Resp: 20  Temp: 97.7 F (36.5 C)  TempSrc: Temporal  SpO2: 100%  Weight: 90.7 kg (200 lb)    Physical Exam: General: no acute distress, non-diaphoretic HENT: Anicteric, no conjunctivitis, moist mucous membranes, hearing intact NECK: no erythema noted at the neck, trachea midline Cardiovascular: adequate capillary refill Respiratory: normal effort, no tactile fremitus Neurologic: Utilizing a manual wheelchair for locomotion Motor: MMT not performed on left knee/leg due to recent surgery and current pain levels.  Otherwise patient is moving all extremities against gravity Psychiatric: Cooperative; awake, alert, and oriented to person, place and time. Appropriate mood and affect. Skin: warm, dry  ____________________________________________________________________ Controlled Substance Monitoring: NCPDMP: Reviewed Yes  UDS (Urine Drug Screen) & Results: Pain Management Panel       * No data to display          ____________________________________________________________________ Imaging and Diagnostic Studies:  All applicable diagnostic studies related to this consultation have been reviewed. Relevant diagnostic reports and/or my personal review of imaging or other diagnostic studies listed below: X-RAY KNEE LEFT (1-2 VIEWS), 12/06/2023 4:32 PM   INDICATION: Post knee surgery  COMPARISON: Dec 01, 2023   IMPRESSION: 1.  Intramedullary rod spanning the mid femur through the mid tibia has been placed antibiotic impregnated beads present. 2.  Small amount of fluid noted within the joint space. 3.  Surrounding soft tissue swelling with scattered soft tissue emphysema. Overlying cutaneous staples. Surgical drain in place. 4.  No unexpected immediate postsurgical findings. I personally reviewed imaging  findings ____________________________________________________________________ Relevant Labs Reviewed:   Lab Results  Component Value Date   CREATININE 0.53 (L) 10/12/2023    Lab Results  Component Value Date   WBC 8.70 10/12/2023   HGB 8.7 (L) 10/12/2023   HCT 25.9 (L) 10/12/2023   MCV 79.7 (L) 10/12/2023   PLT 352 10/12/2023    No results found for: HGBA1C ____________________________________________________________________  Diagnosis: 1. Chronic pain syndrome      2. Chronic pain of left knee      3. 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 old female referred from Tanda Glendia Baller, MD presenting for new patient consultation for chronic left knee pain in the setting of previous knee surgeries including most recently a knee arthrotomy and revision of antibiotic spacer on Dec 06, 2023.  The patient is currently on 2 mg of hydromorphone  prescribed by her orthopedic surgery team.  The patient was previously on long-term opioid therapy with Dr. Waylon at [facility] where she was receiving hydrocodone , but she no longer  receives long-term opioid medication from his office.  I had a good conversation with the patient regarding long-term pharmacological management.  I discussed with her that I typically do not include long-term opioid therapy in my treatment armamentarium but would happy but would be able to assist in coordinating referrals to [facility] medical as well as Dr. Elnor for further evaluation.  Both these practices are in the [place] area.  Medications: PDMP reviewed as above  Procedural Interventions: Do not recommend interventional treatments at this time given recent surgery and history of  Imaging/Diagnostic Studies: pertinent imaging reviewed.  Independent visualization of the imaging were performed by  me.  Referrals/Consults: As above.   Follow up: Follow-up as needed  Treatment plan fully discussed and agreed upon with the patient. All questions were answered.  Given the patient's complex chronic pain symptoms and medical history, the patient will require continued follow-up in a longitudinal fashion.   Treatment plan fully discussed and agreed upon with the patient. All questions were welcomed and answered. Medical decision making for this patient was moderately complex given the patient's preexisting comorbidities, chronicity of the patient's current pathology, review of relevant records from other healthcare providers, independent interpretation of imaging and/or lab results if appropriate, discussion regarding risks and benefits of proceeding forward with or holding off on scheduling a minor procedure if appropriate and designated above, the severity/progression of the pathology discussed, specific medication management if reviewed, discussion regarding financial implications of treatment, and/or the risk of complication and/or morbidity that may exist with or without treatment.  Components of this note were completed via Dragon Medical One Voice dictation software.  Electronically signed by: Shelly Charity, MD 12/29/2023       [1] No past medical history on file. [2] Past Surgical History: Procedure Laterality Date  . KNEE ARTHROSCOPY W/ HARDWARE REMOVAL Left 10/08/2023   REMOVAL HARDWARE KNEE performed by Glendia Ambrose Blush, MD at [facility] OR  . KNEE SURGERY Left 10/08/2023   ARTHROTOMY KNEE performed by Glendia Ambrose Blush, MD at [facility] OR  . KNEE SURGERY Left 12/06/2023   ARTHROTOMY KNEE and revision of antibiotic spacer performed by Glendia Ambrose Blush, MD at [facility] OR  . LEG SURGERY Left    x4  [3] No family history on file. [4] Current Outpatient Medications  Medication Sig Dispense Refill  . acetaminophen  (TYLENOL ) 500 mg tablet Take 2 tablets (1,000 mg total) by mouth  every 6 (six) hours. 100 tablet 0  . cholecalciferol (VITAMIN D3) 1,000 unit (25 mcg) tablet Take 1 each (1,000 Units total) by mouth daily. 90 each 0  . HYDROmorphone  (DILAUDID ) 2 mg tablet Take 1 tablet (2 mg total) by mouth every 6 (six) hours as needed for severe pain (7-10). 28 tablet 0  . naloxone  (Narcan ) 4 mg/actuation spry nasal spray Administer 1 spray into affected nostril(s) as needed for opioid reversal. Spray the contents of 1 device into 1 nostril. Call 911. May repeat with 2nd device in alternate nostril if no response in 2-3 minutes. 2 each 0  . pregabalin  (LYRICA ) 300 mg capsule Take 300 mg by mouth 2 (two) times a day.    . senna (SENOKOT) 8.6 mg tablet Take 1 tablet (8.6 mg total) by mouth daily. 90 tablet 0  . tiZANidine  (ZANAFLEX ) 4 mg tablet Take 1 tablet (4 mg total) by mouth every 6 (six) hours as needed for muscle spasms. 120 tablet 0  . aspirin  81 mg EC tablet Take 1 tablet (81 mg total) by mouth 2 (two) times a day (Patient not taking: Reported on 12/29/2023) 84 tablet 0  . [START ON 01/03/2024] ondansetron  (ZOFRAN -ODT) 4 mg disintegrating tablet Dissolve 1 tablet (4 mg total) on tongue every 8 (eight) hours as needed for nausea or vomiting. 20 tablet 0  . [START ON 01/03/2024] oxyCODONE  (ROXICODONE ) 5 mg immediate release tablet Take 1 tablet (5 mg total) by mouth every 4 (four) hours as needed for moderate pain (4-6). 30 tablet 0   No current facility-administered medications for this visit.  *Some images could not be shown.

## 2023-12-29 NOTE — Telephone Encounter (Signed)
 [facility]  Pain [facility] Sp[facility]ists  New Patient Screening Note  Kerry Young is a 58 y.o. old female presenting for new patient visit.  Pain Description: The pain started 2-5 years ago after a fall. Since the pain first started, it is worse. The most significant location of pain is the left knee. The 1-2 words that best describe the pain: sharp, stabbing, shooting, throbbing, electric, prickling, crampy, and constant. The pain improves with rest, laying down, and constant. The pain is made worse with most movements and general daily activities. The average daily pain score is 7/10. The pain can be as high as 9/10 at its worst. The pain interferes with: All activities, Doing housework, and Sleeping.  Therapies: Previously attempted interventions for this pain concern: none  Has physical therapy been initiated or completed for this pain concern? No  Has chiropractic been completed for this pain concern? No  Medications: Currently utilized over the counter medications/therapy for pain: None  Currently utilized prescription medications/therapies for pain: Membrane stabilizers (gabapentin /lyrica ) and Other: Dilaudid   Currently utilized controlled medications: Pure opioid agonist (Oxycodone /Hydrocodone /Dilaudid /Nucynta/Fentanyl /Methadone)  Is the patient on a blood thinner (including aspirin , Goody/BC/Bayer powder)? No

## 2024-01-17 NOTE — Telephone Encounter (Signed)
 Called and spoke with patient who was confirmed by two identifiers.   S: Calling to request her pain medication refill.   B: OPERATIVE PROCEDURES:   12/06/23 1.  Arthrotomy. 2.  Removal of cement spacer for an implant. 3.  Placement of fusion nail with a nonarticulating antibiotic spacer, which is semi-permanent.   Last received script for oxycodone  5mg , #28, 1 q6h on 01/13/24 and due for refill on 01/20/24.   Scheduled for follow up with Dr. Tanda on 01/19/24.  A: Patient calling to request pain medication refill for Thursday.  Scheduled to see Dr. Tanda on Wednesday of this week.    R: Discussed with patient she would need to discuss her medication refill request with Dr. Tanda at visit on 01/19/24.  She verbalized understanding and was agreeable.   Forwarding to team as fyi.

## 2024-01-19 NOTE — Telephone Encounter (Signed)
 Pt is sick, thinks she has Covid. Needs to r/s today's PO appt. Also requesting refill  .Medication: Oxycodone     Pharmacy and location: [facility] [id number] - [address] [place] - [address] - PHONE: [phone] - FAX: [phone]     Call back number is:  [phone]  Patient aware to allow 48-72 hours for refill request.  How many pills do you have left  3, will be out of med tomorrow   Pt not feeling well and disconnected before message could be read back

## 2024-01-19 NOTE — Telephone Encounter (Signed)
 Attempted to call patient, no answer, left message for her to return my call. Upon return call, if she is unable to come in today, please let her know that we are unable to give her further refills of narcotics at this time. She should follow up with her pain clinic to further discuss.

## 2024-01-19 NOTE — Telephone Encounter (Signed)
 Received call as warm transfer from [facility].   Very upset and tearful on the phone.  States she is sick today and unable to come in.  Reviewed information below with her that if she was unable to keep her appointment would be unable to prescribe further narcotics and to follow up with her pain clinic.   States she was at [facility] on Sunday and that is where she got sick.  States she had her drug test and will take 2 hours for results.    Made aware her request for call back from Dr. Luigi staff had been sent to team.  States she will not return if she is going to be treated this way and have her medications cut off like this.   Forwarding to team for follow up.

## 2024-01-19 NOTE — Telephone Encounter (Signed)
 Confirmed with Dr. Luigi team and confirmed that we are not able to provide a refill of narcotics at this time. I did call [facility] and confirm that she is established with them. Called and spoke with patient, verified her name and DOB. I let her know that we have been letting her know that once she sees the pain clinic, we will not be able to provide further narcotic refills. She then stated I have never been treated this way, there is no compassion. I don't think I will go through with the other surgery. You have a good day and patient disconnected the call.

## 2024-01-19 NOTE — Telephone Encounter (Addendum)
 Patient is returning missed call and was given message below.   Patient noted she will try to make appointment today with Dr. Tanda but is unsure due to patient having covid but is needing the pain medication.   Patient would like to speak with a nurse Jessilyn to discuss further about getting narcotics.   Please call patient back at [phone] (sending as high priority due to patient requesting call back today and asap)

## 2024-01-23 ENCOUNTER — Other Ambulatory Visit: Payer: Self-pay

## 2024-01-23 ENCOUNTER — Emergency Department (HOSPITAL_COMMUNITY)
Admission: EM | Admit: 2024-01-23 | Discharge: 2024-01-24 | Disposition: A | Discharge status: Home / Self Care | Attending: Emergency Medicine | Admitting: Emergency Medicine

## 2024-01-23 ENCOUNTER — Emergency Department (HOSPITAL_COMMUNITY)

## 2024-01-23 DIAGNOSIS — M25562 Pain in left knee: Secondary | ICD-10-CM | POA: Insufficient documentation

## 2024-01-23 DIAGNOSIS — G8929 Other chronic pain: Secondary | ICD-10-CM | POA: Insufficient documentation

## 2024-01-23 DIAGNOSIS — M25552 Pain in left hip: Secondary | ICD-10-CM | POA: Insufficient documentation

## 2024-01-23 DIAGNOSIS — F1120 Opioid dependence, uncomplicated: Secondary | ICD-10-CM

## 2024-01-23 LAB — CBC WITH DIFFERENTIAL/PLATELET
Abs Immature Granulocytes: 0.05 K/uL (ref 0.00–0.07)
Basophils Absolute: 0.1 K/uL (ref 0.0–0.1)
Basophils Relative: 1 %
Eosinophils Absolute: 0.8 K/uL — ABNORMAL HIGH (ref 0.0–0.5)
Eosinophils Relative: 7 %
HCT: 35.5 % — ABNORMAL LOW (ref 36.0–46.0)
Hemoglobin: 10.3 g/dL — ABNORMAL LOW (ref 12.0–15.0)
Immature Granulocytes: 1 %
Lymphocytes Relative: 27 %
Lymphs Abs: 2.9 K/uL (ref 0.7–4.0)
MCH: 21.8 pg — ABNORMAL LOW (ref 26.0–34.0)
MCHC: 29 g/dL — ABNORMAL LOW (ref 30.0–36.0)
MCV: 75.1 fL — ABNORMAL LOW (ref 80.0–100.0)
Monocytes Absolute: 1.1 K/uL — ABNORMAL HIGH (ref 0.1–1.0)
Monocytes Relative: 10 %
Neutro Abs: 5.9 K/uL (ref 1.7–7.7)
Neutrophils Relative %: 54 %
Platelets: 502 K/uL — ABNORMAL HIGH (ref 150–400)
RBC: 4.73 MIL/uL (ref 3.87–5.11)
RDW: 15.9 % — ABNORMAL HIGH (ref 11.5–15.5)
WBC: 10.9 K/uL — ABNORMAL HIGH (ref 4.0–10.5)
nRBC: 0 % (ref 0.0–0.2)

## 2024-01-23 LAB — COMPREHENSIVE METABOLIC PANEL WITH GFR
ALT: 16 U/L (ref 0–44)
AST: 16 U/L (ref 15–41)
Albumin: 3.7 g/dL (ref 3.5–5.0)
Alkaline Phosphatase: 81 U/L (ref 38–126)
Anion gap: 9 (ref 5–15)
BUN: 10 mg/dL (ref 6–20)
CO2: 18 mmol/L — ABNORMAL LOW (ref 22–32)
Calcium: 8.9 mg/dL (ref 8.9–10.3)
Chloride: 111 mmol/L (ref 98–111)
Creatinine, Ser: 0.68 mg/dL (ref 0.44–1.00)
GFR, Estimated: >60 mL/min (ref >60)
Glucose, Bld: 87 mg/dL (ref 70–99)
Potassium: 4.1 mmol/L (ref 3.5–5.1)
Sodium: 138 mmol/L (ref 135–145)
Total Bilirubin: 0.6 mg/dL (ref 0.0–1.2)
Total Protein: 7.3 g/dL (ref 6.5–8.1)

## 2024-01-23 MED ORDER — OXYCODONE-ACETAMINOPHEN 5-325 MG PO TABS
1.0000 | ORAL_TABLET | Freq: Once | ORAL | Status: AC
  Administered 2024-01-23: 1 via ORAL
  Filled 2024-01-23: qty 1

## 2024-01-23 MED ORDER — PREGABALIN 100 MG PO CAPS
300.0000 mg | ORAL_CAPSULE | Freq: Once | ORAL | Status: AC
  Administered 2024-01-23: 300 mg via ORAL
  Filled 2024-01-23: qty 3

## 2024-01-23 MED ORDER — KETOROLAC TROMETHAMINE 15 MG/ML IJ SOLN
15.0000 mg | Freq: Once | INTRAMUSCULAR | Status: AC
  Administered 2024-01-23: 15 mg via INTRAVENOUS
  Filled 2024-01-23: qty 1

## 2024-01-23 MED ORDER — LIDOCAINE 5 % EX PTCH
1.0000 | MEDICATED_PATCH | CUTANEOUS | Status: DC
  Administered 2024-01-23: 1 via TRANSDERMAL
  Filled 2024-01-23: qty 1

## 2024-01-23 MED ORDER — ACETAMINOPHEN 500 MG PO TABS
1000.0000 mg | ORAL_TABLET | Freq: Once | ORAL | Status: AC
  Administered 2024-01-23: 1000 mg via ORAL
  Filled 2024-01-23: qty 2

## 2024-01-23 MED ORDER — GABAPENTIN 100 MG PO CAPS
200.0000 mg | ORAL_CAPSULE | Freq: Once | ORAL | Status: AC
  Administered 2024-01-23: 200 mg via ORAL
  Filled 2024-01-23: qty 2

## 2024-01-23 MED ORDER — OXYCODONE-ACETAMINOPHEN 5-325 MG PO TABS
2.0000 | ORAL_TABLET | Freq: Once | ORAL | Status: AC
  Administered 2024-01-23: 2 via ORAL
  Filled 2024-01-23: qty 2

## 2024-01-23 NOTE — ED Notes (Signed)
 Pt in bed, pt states that she is starting pain management and her surgeon has stopped prescribing her pain med, pt c/o L knee pain, pt has multiple scars on her L knee all healed.  Resps even and unlabored. Pt in nad

## 2024-01-23 NOTE — ED Triage Notes (Addendum)
 Pt BIB [facility] with reports of chronic left knee pain. Pt reports her surgeon stopped her pain meds on Thursday. Pt reports she has been taking oxycodone  5mg . Pt was given 200 mcg fentanyl  en route.

## 2024-01-23 NOTE — ED Provider Notes (Signed)
 Kerry Young AT [facility] Provider Note   CSN: [id number] Arrival date & time: 01/23/24  1659     Patient presents with: Knee Pain   Kerry Young is a 58 y.o. female who presents with chronic left knee pain which she states has increased over the last several days, has been out of her oxycodone  for the last 5 days.  Brought in by EMS, provided with 200 mcg of fentanyl  and route, still complains of severe left lower extremity pain, states that originates in the superior portion of the left knee and radiates to the dorsum of the left foot and up into the left hip, pointing to the area of the left greater trochanter.  She has extensive surgical history on the left knee, recent surgical procedures last completed on March 2025.  Is due to consult with surgeon and Kerry Young this coming week, however pain is now to the point where she feels he cannot tolerate it.    Knee Pain      Prior to Admission medications   Medication Sig Start Date End Date Taking? Authorizing Provider  oxyCODONE  (OXY IR/ROXICODONE ) 5 MG immediate release tablet Take 5 mg by mouth 4 (four) times daily as needed for moderate pain (pain score 4-6) or severe pain (pain score 7-10).    [provider]  pregabalin  (LYRICA ) 300 MG capsule Take 1 capsule (300 mg total) by mouth 2 (two) times daily. 07/24/22   Clapacs, Norleen DASEN, MD    Allergies: Amitriptyline, Celexa  [citalopram ], Methocarbamol , Naproxen , Other, and Zofran  [ondansetron ]    Review of Systems  Musculoskeletal:  Positive for arthralgias.  All other systems reviewed and are negative.   Updated Vital Signs BP 115/62 (BP Location: Right Arm)   Pulse 92   Resp 18   SpO2 100%   Physical Exam Vitals and nursing note reviewed.  Constitutional:      General: She is not in acute distress.    Appearance: She is well-developed.  HENT:     Head: Normocephalic and atraumatic.  Eyes:     General: No visual field deficit.     Conjunctiva/sclera: Conjunctivae normal.  Cardiovascular:     Rate and Rhythm: Normal rate and regular rhythm.     Heart sounds: No murmur heard. Pulmonary:     Effort: Pulmonary effort is normal. No respiratory distress.     Breath sounds: Normal breath sounds.  Abdominal:     Palpations: Abdomen is soft.     Tenderness: There is no abdominal tenderness.  Musculoskeletal:        General: No swelling.     Cervical back: Neck supple.     Left hip: Tenderness present.     Right knee: Normal.     Left knee: Tenderness present.     Right foot: Normal.     Left foot: Decreased range of motion.     Comments: Tenderness and pain noted in the left anterior knee, reduced range of motion in the left foot secondary to pain with dorsiflexion.  Tenderness to palpation of the left greater trochanter.  Skin:    General: Skin is warm and dry.     Capillary Refill: Capillary refill takes less than 2 seconds.  Neurological:     General: No focal deficit present.     Mental Status: She is alert and oriented to person, place, and time.     GCS: GCS eye subscore is 4. GCS verbal subscore is 5. GCS motor subscore  is 6.     Cranial Nerves: No cranial nerve deficit, dysarthria or facial asymmetry.     Sensory: Sensation is intact.     Motor: Motor function is intact.  Psychiatric:        Mood and Affect: Mood normal.     (all labs ordered are listed, but only abnormal results are displayed) Labs Reviewed - No data to display  EKG: None  Radiology: No results found.   Procedures   Medications Ordered in the ED - No data to display  Clinical Course as of 01/23/24 2328  [place] Jan 23, 2024  2028 Reassessed patient, after administration of Roxicet, patient's pain has significantly decreased.  She has concerns regarding continued pain and withdrawal after discharge. [JG]  2301 DG Knee Complete 4 Views Left Review of imaging does not show any acute changes on knee, tib-fib, or hip. [JG]     Clinical Course User Index [JG] Kerry Dorn BROCKS, PA                                 Medical Decision Making Amount and/or Complexity of Data Reviewed Labs: ordered. Radiology: ordered. Decision-making details documented in ED Course.  Risk OTC drugs. Prescription drug management.   Medical Decision Making:   Kerry Young is a 58 y.o. female who presented to the ED today with left leg pain detailed above.    Patient's presentation is complicated by their history of chronic left leg pain surgical history.  Complete initial physical exam performed, notably the patient  was alert and oriented in no apparent distress.  Complains of severe pain to the left leg radiating from the left greater trochanter distally into the left calf.      Reviewed and confirmed nursing documentation for past medical history, family history, social history.    Initial Assessment:   With the patient's presentation of left leg pain, differential includes acute injury to the left lower extremity, pain secondary to opiate use disorder.   Initial Plan:  Given physical exam and complaint at this time further imaging is deferred. Screening labs including CBC and Metabolic panel to evaluate for infectious or metabolic etiology of disease.  Objective evaluation as below reviewed   Initial Study Results:   Laboratory  All laboratory results reviewed without evidence of clinically relevant pathology.   Exceptions include: No acute findings   Reassessment and Plan:   Patient's pain responded to administration of her standing order of of Roxicet.  As such believe this patient's pain secondary to opiate use disorder.  She has longstanding history of continued use of opioids for pain management.  Plan is to provide patient with resources to seek further pain management on an outpatient basis.  He has been made clear the patient that at this time she will not be provided with an outpatient prescription for opiate  pain medication.  She understands and agrees, at this time states she will follow-up with pain management in the morning.       Final diagnoses:  None    ED Discharge Orders     None          Kerry Young, Kerry Young 01/23/24 2331    Franklyn Sid SAILOR, MD 01/25/24 [phone]

## 2024-01-23 NOTE — ED Notes (Signed)
 Patient returned from X-ray

## 2024-01-23 NOTE — ED Notes (Signed)
 Patient transported to X-ray

## 2024-02-20 ENCOUNTER — Emergency Department (HOSPITAL_COMMUNITY)
Admission: EM | Admit: 2024-02-20 | Discharge: 2024-02-21 | Disposition: A | Discharge status: Home / Self Care | Attending: Emergency Medicine | Admitting: Emergency Medicine

## 2024-02-20 ENCOUNTER — Other Ambulatory Visit: Payer: Self-pay

## 2024-02-20 ENCOUNTER — Encounter (HOSPITAL_COMMUNITY): Payer: Self-pay

## 2024-02-20 DIAGNOSIS — M62838 Other muscle spasm: Secondary | ICD-10-CM | POA: Insufficient documentation

## 2024-02-20 DIAGNOSIS — F1910 Other psychoactive substance abuse, uncomplicated: Secondary | ICD-10-CM | POA: Diagnosis not present

## 2024-02-20 DIAGNOSIS — M25562 Pain in left knee: Secondary | ICD-10-CM | POA: Diagnosis present

## 2024-02-20 DIAGNOSIS — R52 Pain, unspecified: Secondary | ICD-10-CM

## 2024-02-20 LAB — CBC WITH DIFFERENTIAL/PLATELET
Abs Immature Granulocytes: 0.07 K/uL (ref 0.00–0.07)
Basophils Absolute: 0.1 K/uL (ref 0.0–0.1)
Basophils Relative: 1 %
Eosinophils Absolute: 0 K/uL (ref 0.0–0.5)
Eosinophils Relative: 0 %
HCT: 42.3 % (ref 36.0–46.0)
Hemoglobin: 12 g/dL (ref 12.0–15.0)
Immature Granulocytes: 1 %
Lymphocytes Relative: 9 %
Lymphs Abs: 1.2 K/uL (ref 0.7–4.0)
MCH: 20.8 pg — ABNORMAL LOW (ref 26.0–34.0)
MCHC: 28.4 g/dL — ABNORMAL LOW (ref 30.0–36.0)
MCV: 73.3 fL — ABNORMAL LOW (ref 80.0–100.0)
Monocytes Absolute: 0.6 K/uL (ref 0.1–1.0)
Monocytes Relative: 4 %
Neutro Abs: 11.2 K/uL — ABNORMAL HIGH (ref 1.7–7.7)
Neutrophils Relative %: 85 %
Platelets: 523 K/uL — ABNORMAL HIGH (ref 150–400)
RBC: 5.77 MIL/uL — ABNORMAL HIGH (ref 3.87–5.11)
RDW: 17 % — ABNORMAL HIGH (ref 11.5–15.5)
WBC: 13.1 K/uL — ABNORMAL HIGH (ref 4.0–10.5)
nRBC: 0 % (ref 0.0–0.2)

## 2024-02-20 LAB — COMPREHENSIVE METABOLIC PANEL WITH GFR
ALT: 27 U/L (ref 0–44)
AST: 43 U/L — ABNORMAL HIGH (ref 15–41)
Albumin: 3.9 g/dL (ref 3.5–5.0)
Alkaline Phosphatase: 87 U/L (ref 38–126)
Anion gap: 14 (ref 5–15)
BUN: 11 mg/dL (ref 6–20)
CO2: 17 mmol/L — ABNORMAL LOW (ref 22–32)
Calcium: 9.6 mg/dL (ref 8.9–10.3)
Chloride: 107 mmol/L (ref 98–111)
Creatinine, Ser: 0.74 mg/dL (ref 0.44–1.00)
GFR, Estimated: >60 mL/min (ref >60)
Glucose, Bld: 105 mg/dL — ABNORMAL HIGH (ref 70–99)
Potassium: 4.1 mmol/L (ref 3.5–5.1)
Sodium: 138 mmol/L (ref 135–145)
Total Bilirubin: 1 mg/dL (ref 0.0–1.2)
Total Protein: 8.7 g/dL — ABNORMAL HIGH (ref 6.5–8.1)

## 2024-02-20 LAB — CBG MONITORING, ED: Glucose-Capillary: 112 mg/dL — ABNORMAL HIGH (ref 70–99)

## 2024-02-20 MED ORDER — METOCLOPRAMIDE HCL 5 MG/ML IJ SOLN
10.0000 mg | Freq: Once | INTRAMUSCULAR | Status: AC
  Administered 2024-02-20: 10 mg via INTRAVENOUS
  Filled 2024-02-20: qty 2

## 2024-02-20 MED ORDER — ACETAMINOPHEN 500 MG PO TABS
1000.0000 mg | ORAL_TABLET | Freq: Once | ORAL | Status: AC
  Administered 2024-02-20: 1000 mg via ORAL
  Filled 2024-02-20: qty 2

## 2024-02-20 MED ORDER — LIDOCAINE 5 % EX PTCH
1.0000 | MEDICATED_PATCH | CUTANEOUS | Status: DC
  Administered 2024-02-20: 1 via TRANSDERMAL
  Filled 2024-02-20: qty 1

## 2024-02-20 NOTE — ED Provider Triage Note (Signed)
 Emergency Medicine Provider Triage Evaluation Note  Kerry Young , a 57 y.o. female  was evaluated in triage.  Pt complains of sever pain in her left knee/lower extremity that is chronic for her. Reports that she feels nauseated and feels like she is going to die, feels awful, took her suboxone this AM but states it doesn't help. Patient is extremely upset and in moderate acute distress yelling in pain.  States her left foot is always turned downward  Review of Systems  Positive: pain Negative: Doesn't answer ROS questions  Physical Exam  BP (!) 122/100 (BP Location: Left Arm)   Pulse (!) 103   Resp (!) 23   Ht 5' 2 (1.575 m)   Wt 104.3 kg   SpO2 97%   BMI 42.07 kg/m  Gen:   Awake, yelling out in pain, only partially verbally redirectable Resp:  Normal effort  MSK:   Moves extremities without difficulty; prior surgical scars on L knee. L knee held in extension, foot plantarflexed, NVI in LLE Other:   Diaphoretic  Medical Decision Making  Medically screening exam initiated at 6:27 PM.  Appropriate orders placed.  Kerry Young was informed that the remainder of the evaluation will be completed by another provider, this initial triage assessment does not replace that evaluation, and the importance of remaining in the ED until their evaluation is complete.  Patient moved to treatment space and labs drawn   Kerry Sid SAILOR, MD 02/20/24 1843

## 2024-02-20 NOTE — ED Provider Notes (Signed)
 [place] [facility] AT [facility] Provider Note   CSN: [id number] Arrival date & time: 02/20/24  1800     History  Chief Complaint  Patient presents with   Withdrawal    Kerry Young is a 58 y.o. female with PMH as listed below who presents with severe pain in her left knee/lower extremity that is chronic for her. Reports that she feels nauseated and feels like she is going to die, took her suboxone this AM but states it doesn't help, also states it tastes very bad and it makes her nauseated to take it. Patient is extremely upset and in moderate acute distress yelling in pain.  States her left foot is always turned downward. Patient reports pain all over. She denies any abdominal pain, urinary sxs, or fevers/chills at home. Specifically denies chest pain or shortness of breath, no leg swelling.   Past Medical History:  Diagnosis Date   Anxiety    Arthritis    Migraines    PONV (postoperative nausea and vomiting)    PTSD (post-traumatic stress disorder)    Sciatica    UTI (urinary tract infection)        Home Medications Prior to Admission medications   Medication Sig Start Date End Date Taking? Authorizing Provider  methocarbamol  (ROBAXIN ) 500 MG tablet Take 1 tablet (500 mg total) by mouth 2 (two) times daily. 02/21/24  Yes Franklyn Sid SAILOR, MD  metoCLOPramide  (REGLAN ) 10 MG tablet Take 1 tablet (10 mg total) by mouth every 6 (six) hours. 02/21/24  Yes Franklyn Sid SAILOR, MD  oxyCODONE  (OXY IR/ROXICODONE ) 5 MG immediate release tablet Take 5 mg by mouth 4 (four) times daily as needed for moderate pain (pain score 4-6) or severe pain (pain score 7-10).    [provider]  pregabalin  (LYRICA ) 300 MG capsule Take 1 capsule (300 mg total) by mouth 2 (two) times daily. 07/24/22   Clapacs, Norleen DASEN, MD      Allergies    Amitriptyline, Celexa  [citalopram ], Methocarbamol , Naproxen , Other, and Zofran  [ondansetron ]    Review of Systems   Review of Systems A 10  point review of systems was performed and is negative unless otherwise reported in HPI.  Physical Exam Updated Vital Signs BP (!) 139/97   Pulse (!) 105   Temp 97.9 F (36.6 C) (Oral)   Resp 20   Ht 5' 2 (1.575 m)   Wt 104.3 kg   SpO2 93%   BMI 42.07 kg/m  Physical Exam General: Female in acute emotional distress, yelling out and crying HEENT: PERRLA, Sclera anicteric, MMM, trachea midline.  Cardiology: Mild regular tachycardic rate, no murmurs/rubs/gallops. BL radial and DP pulses equal bilaterally.  Resp: Normal respiratory rate and effort. CTAB, no wheezes, rhonchi, crackles.  Abd: Soft, non-tender, non-distended. No rebound tenderness or guarding.  GU: Deferred. MSK: No peripheral edema or signs of trauma. No acute deformity or joint effusions noted in BL UEs/LEs. No areas of induration/fluctuance/erythema noted.  Skin: warm, initially with mild perspiration. No rashes or lesions. Back: No CVA tenderness Neuro: A&Ox4, CNs II-XII grossly intact. 5/5 strength in all extremities. Sensation grossly intact.   ED Results / Procedures / Treatments   Labs (all labs ordered are listed, but only abnormal results are displayed) Labs Reviewed  CBC WITH DIFFERENTIAL/PLATELET - Abnormal; Notable for the following components:      Result Value   WBC 13.1 (*)    RBC 5.77 (*)    MCV 73.3 (*)  MCH 20.8 (*)    MCHC 28.4 (*)    RDW 17.0 (*)    Platelets 523 (*)    Neutro Abs 11.2 (*)    All other components within normal limits  COMPREHENSIVE METABOLIC PANEL WITH GFR - Abnormal; Notable for the following components:   CO2 17 (*)    Glucose, Bld 105 (*)    Total Protein 8.7 (*)    AST 43 (*)    All other components within normal limits  CBG MONITORING, ED - Abnormal; Notable for the following components:   Glucose-Capillary 112 (*)    All other components within normal limits    EKG None  Radiology No results found.  Procedures Procedures    Medications Ordered in  ED Medications  acetaminophen  (TYLENOL ) tablet 1,000 mg (1,000 mg Oral Given 02/20/24 2325)  metoCLOPramide  (REGLAN ) injection 10 mg (10 mg Intravenous Given 02/20/24 2326)  methocarbamol  (ROBAXIN ) tablet 500 mg (500 mg Oral Given 02/21/24 0135)    ED Course/ Medical Decision Making/ A&P                          Medical Decision Making Amount and/or Complexity of Data Reviewed Labs: ordered.  Risk OTC drugs. Prescription drug management.    This patient presents to the ED for concern of multiple complaints, pain all-over, nausea, this involves an extensive number of treatment options, and is a complaint that carries with it a high risk of complications and morbidity.  I considered the following differential and admission for this acute, potentially life threatening condition. Patient in acute emotional distress, mild tachycardia, otherwise HDS, afebrile.  MDM:    Patient in acute emotional distress, yelling out and tearful, anxious appearing on exam. She reports nausea/vomiting and states that her suboxone tastes badly. She raises concern for possible opiate withdrawal but states that she has been taking her suboxone. Will allow her to take her home suboxone dose. She has no significant electrolyte derangements or hypo/hyperglycemia. She has no anemia. She has no acute deformity or trauma to the lower extremities or upper extremities. She was given robaxin , reglan , and tylenol  as well with also improvement in her pain. She denies any abdominal pain, urinary sxs, or fevers/chills at home, no c/f UTI. No yawning noted, but patient is noted to be sweating and agitated on arrival with reported nausea and body pain, so COWS score ~10. She has improvement after she takes her home suboxone and is given other pain/nausea meds, and COWS then is 4. No fever or e/o acute infection such as septic arthritis or cellulitis. Possible she was also having musculoskeletal pain such as muscle spasm. Overall doubt  acute life threatening etiology of her sxs. She will be discharged home with robaxin  and reglan  rx and instructions to take her suboxone as prescribed. Advised her to stay well hydrated and f/u with PCP within 1 week.      Labs: I Ordered, and personally interpreted labs.  The pertinent results include:  those listed above  Additional history obtained from chart review.    Cardiac Monitoring: The patient was maintained on a cardiac monitor.  I personally viewed and interpreted the cardiac monitored which showed an underlying rhythm of: sinus tachycardia and normal sinus rhythm  Reevaluation: After the interventions noted above, I reevaluated the patient and found that they have :improved  Social Determinants of Health: Lives independently  Disposition:  DC w/ discharge instructions/return precautions. All questions answered to patient's satisfaction.  Co morbidities that complicate the patient evaluation  Past Medical History:  Diagnosis Date   Anxiety    Arthritis    Migraines    PONV (postoperative nausea and vomiting)    PTSD (post-traumatic stress disorder)    Sciatica    UTI (urinary tract infection)      Medicines Meds ordered this encounter  Medications   DISCONTD: lidocaine  (LIDODERM ) 5 % 1 patch   acetaminophen  (TYLENOL ) tablet 1,000 mg   metoCLOPramide  (REGLAN ) injection 10 mg   methocarbamol  (ROBAXIN ) tablet 500 mg   methocarbamol  (ROBAXIN ) 500 MG tablet    Sig: Take 1 tablet (500 mg total) by mouth 2 (two) times daily.    Dispense:  20 tablet    Refill:  0   metoCLOPramide  (REGLAN ) 10 MG tablet    Sig: Take 1 tablet (10 mg total) by mouth every 6 (six) hours.    Dispense:  30 tablet    Refill:  0    I have reviewed the patients home medicines and have made adjustments as needed  Problem List / ED Course: Problem List Items Addressed This Visit   None Visit Diagnoses       Acute pain    -  Primary     Polysubstance abuse (HCC)         Muscle  spasm                       This note was created using dictation software, which may contain spelling or grammatical errors.    Franklyn Sid SAILOR, MD 02/28/24 [phone]

## 2024-02-20 NOTE — ED Triage Notes (Signed)
 Pt called out for suboxone that was stuck under tongue. C/O ribs,neck, back, and right leg pain.

## 2024-02-20 NOTE — ED Notes (Signed)
 NT attempted to get temperature. Pt allowed one try, did not work, Pt refused second try.

## 2024-02-21 MED ORDER — METOCLOPRAMIDE HCL 10 MG PO TABS: 10.0000 mg | ORAL_TABLET | Freq: Four times a day (QID) | ORAL | 0 refills | Status: AC

## 2024-02-21 MED ORDER — METHOCARBAMOL 500 MG PO TABS
500.0000 mg | ORAL_TABLET | Freq: Once | ORAL | Status: AC
  Administered 2024-02-21: 500 mg via ORAL
  Filled 2024-02-21: qty 1

## 2024-02-21 MED ORDER — METHOCARBAMOL 500 MG PO TABS: 500.0000 mg | ORAL_TABLET | Freq: Two times a day (BID) | ORAL | 0 refills | Status: AC

## 2024-02-21 NOTE — Discharge Instructions (Addendum)
 Thank you for coming to [facility] Emergency Department. You were seen for pain and concern for withdrawal. We did an exam, labs, and imaging, and these showed no acute findings. Please take your suboxone as prescribed. You can take methocarbamol  (robaxin ) 500 mg twice per day as needed for muscle spasms. You can take reglan  10 mg every 6-8 hours as needed for nausea/vomiting. Please stay well hydrated. Please follow up with your primary care provider within 1 week.   Do not hesitate to return to the ED or call 911 if you experience: -Worsening symptoms -Lightheadedness, passing out -Fevers/chills -Anything else that concerns you

## 2024-02-21 NOTE — ED Notes (Signed)
 Pt refuses blood pressure cuff, pt is dry heaving.

## 2024-02-21 NOTE — ED Notes (Signed)
 Notified patient of need for ua sample. Patient reports unable to sit on toilet & stated I have to have pain medication before I can do anything Patient asked this RN to open patients home suboxone for her. Explained to patient that needed to have MD approval to prior to taking home medication.

## 2024-04-06 NOTE — Progress Notes (Signed)
 ------------------------------------------------------------------------------- Attestation signed by Kayla JONETTA Hoit, MD at 04/06/24 1555 Impression:   1. Left knee pain, unspecified chronicity  FL Guided Aspiration   Culture, Body Fluid with Gram Stain Body Fluid Joint, Left Knee   Cell count with crystals, synovial fluid Joint, Other   Cell count with crystals, synovial fluid Joint, Other   Culture, Body Fluid with Gram Stain Body Fluid Joint, Left Knee   FL Guided Aspiration   CBC And Differential   C-Reactive Protein   Sedimentation Rate, Automated   Sedimentation Rate, Automated   C-Reactive Protein   CBC And Differential   CANCELED: CBC And Differential   CANCELED: Sedimentation Rate, Automated   CANCELED: C-Reactive Protein   CANCELED: Cell count with crystals, synovial fluid Joint, Other   CANCELED: Culture, Body Fluid with Gram Stain Body Fluid   CANCELED: C-Reactive Protein   CANCELED: Sedimentation Rate, Automated   CANCELED: CBC And Differential    2. Visit for screening mammogram  CANCELED: Mammo 3D Tomo Screening Bilateral     I, Kayla JONETTA Hoit, MD, saw and evaluated the patient, contributing to the history and performing the musculoskeletal exam.  I reviewed and agree with the Physician Assistant's note and personally formulated the following  Plan: I explained to the patient that she has a very complicated situation, she does have a static spacer, but also has a pes equinus contracture, and cannot dorsiflex her ankle fully, and this may be an issue postoperatively, however due to the extensive reconstruction likely we are going to obtain a CBC with differential, sed rate and C-reactive protein and confirm that these are still very low as she is having some increasing pain in her left knee and also arrange for fluoroscopic aspiration she has been off of IV antibiotics for several months, and she will follow-up with my partner, Dr. Geofm, for discussion of  definitive treatment options as she would likely benefit from reimplantation, she does smoke 1 cigarette a day, I did explain to her that she needs to be nicotine  free for at least 3 weeks before considering reimplantation      No prescriptions requested or ordered in this encounter   Follow up if symptoms worsen or fail to improve.   Cc: No ref. provider found  Cc: No primary care provider on file.   Electronically Signed by: Kayla JONETTA Hoit, MD 04/06/2024 3:55 PM The 21st Century Cures Act requires that medical notes like this be available to patients in the interest of transparency. However, be advised this is a medical document. It is intended as provider to provider communication. It is written in medical language and may contain abbreviations or wording that is unfamiliar. It may appear blunt or direct. Medical documents are intended to carry relevant information, facts as evident, and the clinical opinion of the practitioner. Also, this note was prepared using voice recognition dictation software which sometimes creates errors that are not corrected upon review. If there are any questions, please feel free to contact me at [phone].      -------------------------------------------------------------------------------  OFFICE NOTE: CHIEF COMPLAINT:   Chief Complaint  Patient presents with  . Left Knee - Pain    HISTORY: Kerry Young is a English speaking patient that presents today with left knee pain, who is a 58 y.o. female,   Patient has a complex history with regards to the left knee.  Initial injury was sustained in summer 2023 after a fall onto the left knee resulting in a tibial plateau  fracture.  She underwent ORIF on 01/28/2022 at [facility] with Dr.Haddix.  Unfortunately her postoperative course was complicated by infection of the left knee, intraoperative wound cultures significant for MRSA.  She underwent I&D on 02/13/2022 and was placed on  IV antibiotics for 6 weeks.  Due to ongoing instability of the left knee, an external fixator was placed on 02/18/2022, and subsequently removed on 03/25/2022.  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  She ultimately needed revision of the antibiotic spacer on 12/06/2023.  She reports that she has completed her IV antibiotics and is not currently on any oral antibiotic suppression.  She takes Lyrica  and Suboxone for her pain and is pretty much wheelchair-bound at this point.  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  At this point, she ultimately would like a conversion to a total knee arthroplasty.  Past Medical History:  Diagnosis Date  . Anxiety   . Foot fracture, right   . PONV (postoperative nausea and vomiting)    REQUEST SCOPALAMINE PATCH   . 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 Cervix surgery  10/2019  . Foot fracture surgery    . Knee surgery Left 2023   Dr. Kraig in [place] - first 4 surgeries placement of screws. placement of rods, and removal of rods.  . Left knee surgery  2025   Dr. Glendia blush [place] did last two recent surgeries  - removed plate and screws and placed abx spacer. Last surgery had emergency surgery removal of spacer and placed abx concrete in knee.  . Wisdom tooth extraction      The patient's allergies, current medications, past family history, past medical history, past social history, past surgical history and problem list were reviewed and updated as appropriate.  All systems reviewed are negative except for the musculoskeletal system as reviewed in the HPI.  PHYSICAL EXAM:    Vitals:   04/06/24 1445  Weight: 190 lb (86.2 kg)  Height: 5' 2 (1.575 m)   General Appearance:  Appears Stated Age,  Well-Nourished; Oriented x 3; Gait: Ambulating with a limp; Eyes are clear, pupils symmetric and reactive to light; Skin: no lesions, no erythema; Neck: no thyroid enlargement, no jugular venous distension Left Knee:Effusion-minimal (Right:  none ) Alignment-neutral (Right: normal  )  ROM- Locked in extension due to static antibiotic spacer (Right: 0 -- 130  ) Patellofemoral Crepitus- none (Right: none  )   Pain- diffuse (Right:none   )  Tenderness- diffuse (Right:nontender)  Motor Strength- 5/5 throughout each lower extremity  Sensation- intact to light touch bilateral Vascular-2+ Dorsalis Pedis Pulse bilateral Stress Testing- stable bilateral  RADIOGRAPHIC STUDIES:  X-Ray--Left Knee--Bilateral Weight Bearing AP/Lat/Bilat--[place]--01/23/24: Dr. Genevia and I personally reviewed the xrays and they demonstrated the following:  Static antibiotic spacer noted without acute complications.  IMPRESSION:  Left Knee-  1. Left knee pain, unspecified chronicity  FL Guided Aspiration   Culture, Body Fluid with Gram Stain Body Fluid Joint, Left Knee   Cell count with crystals, synovial fluid Joint, Other   Cell count with crystals, synovial fluid Joint, Other   Culture, Body Fluid with Gram Stain Body Fluid Joint, Left Knee   FL Guided Aspiration   CBC And Differential   C-Reactive Protein   Sedimentation  Rate, Automated   Sedimentation Rate, Automated   C-Reactive Protein   CBC And Differential   CANCELED: CBC And Differential   CANCELED: Sedimentation Rate, Automated   CANCELED: C-Reactive Protein   CANCELED: Cell count with crystals, synovial fluid Joint, Other   CANCELED: Culture, Body Fluid with Gram Stain Body Fluid   CANCELED: C-Reactive Protein   CANCELED: Sedimentation Rate, Automated   CANCELED: CBC And Differential    2. Visit for screening mammogram  Mammo 3D Tomo Screening Bilateral      PLAN: Refer to plan formulated and initiated by the physician's face to face encounter.       No prescriptions requested or ordered in this encounter   Follow up if symptoms worsen or fail to improve.  Cc: No ref. provider found  Cc: No primary care provider on file.   Seen and evaluated by Avelina Kerns, PA-C and Dr. Genevia, dictated by Avelina Kerns, PA-C. Electronically signed by Dr. Genevia.  Portions of this note were dictated using Animal nutritionist. It has been reviewed for accuracy, but may contain grammatical and clerical errors. If there are any questions please feel free to contact me at [phone]
